# Patient Record
Sex: Female | Born: 1950
Health system: Southern US, Community
[De-identification: ages and names within clinical notes are randomized; demographics above are authoritative.]

## PROBLEM LIST (undated history)

## (undated) DIAGNOSIS — I519 Heart disease, unspecified: Secondary | ICD-10-CM

## (undated) DIAGNOSIS — R1030 Lower abdominal pain, unspecified: Secondary | ICD-10-CM

## (undated) DIAGNOSIS — M21611 Bunion of right foot: Secondary | ICD-10-CM

## (undated) DIAGNOSIS — J42 Unspecified chronic bronchitis: Secondary | ICD-10-CM

## (undated) DIAGNOSIS — R600 Localized edema: Secondary | ICD-10-CM

## (undated) DIAGNOSIS — M419 Scoliosis, unspecified: Secondary | ICD-10-CM

## (undated) DIAGNOSIS — I10 Essential (primary) hypertension: Secondary | ICD-10-CM

## (undated) DIAGNOSIS — E669 Obesity, unspecified: Secondary | ICD-10-CM

## (undated) DIAGNOSIS — M21612 Bunion of left foot: Secondary | ICD-10-CM

## (undated) DIAGNOSIS — R011 Cardiac murmur, unspecified: Secondary | ICD-10-CM

## (undated) DIAGNOSIS — I340 Nonrheumatic mitral (valve) insufficiency: Secondary | ICD-10-CM

## (undated) DIAGNOSIS — R002 Palpitations: Secondary | ICD-10-CM

## (undated) DIAGNOSIS — J45909 Unspecified asthma, uncomplicated: Secondary | ICD-10-CM

## (undated) DIAGNOSIS — I350 Nonrheumatic aortic (valve) stenosis: Secondary | ICD-10-CM

## (undated) DIAGNOSIS — E785 Hyperlipidemia, unspecified: Secondary | ICD-10-CM

## (undated) DIAGNOSIS — H269 Unspecified cataract: Secondary | ICD-10-CM

## (undated) DIAGNOSIS — M1990 Unspecified osteoarthritis, unspecified site: Secondary | ICD-10-CM

## (undated) HISTORY — PX: BUNIONECTOMY: SHX129

## (undated) HISTORY — DX: Scoliosis, unspecified: M41.9

## (undated) HISTORY — DX: Unspecified osteoarthritis, unspecified site: M19.90

## (undated) HISTORY — DX: Nonrheumatic mitral (valve) insufficiency: I34.0

## (undated) HISTORY — PX: TOTAL ABDOMINAL HYSTERECTOMY: SHX209

## (undated) HISTORY — DX: Bunion of right foot: M21.611

## (undated) HISTORY — PX: KNEE ARTHROSCOPY: SUR90

## (undated) HISTORY — PX: OTHER SURGICAL HISTORY: SHX169

## (undated) HISTORY — PX: ANKLE ARTHROSCOPY: SHX545

## (undated) HISTORY — DX: Unspecified asthma, uncomplicated: J45.909

## (undated) HISTORY — DX: Heart disease, unspecified: I51.9

## (undated) HISTORY — PX: ABDOMINAL HYSTERECTOMY: SHX81

## (undated) HISTORY — DX: Bunion of right foot: M21.612

## (undated) HISTORY — PX: EYE SURGERY: SHX253

## (undated) HISTORY — DX: Hyperlipidemia, unspecified: E78.5

## (undated) HISTORY — DX: Localized edema: R60.0

## (undated) HISTORY — DX: Palpitations: R00.2

## (undated) HISTORY — PX: TRANSTHORACIC ECHOCARDIOGRAM: SHX275

## (undated) HISTORY — DX: Unspecified chronic bronchitis: J42

## (undated) HISTORY — DX: Unspecified cataract: H26.9

## (undated) HISTORY — DX: Lower abdominal pain, unspecified: R10.30

## (undated) HISTORY — DX: Obesity, unspecified: E66.9

## (undated) HISTORY — DX: Nonrheumatic aortic (valve) stenosis: I35.0

## (undated) HISTORY — DX: Essential (primary) hypertension: I10

---
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
## 1999-01-07 ENCOUNTER — Other Ambulatory Visit: Admission: RE | Admit: 1999-01-07 | Discharge: 1999-01-07 | Payer: Self-pay | Admitting: *Deleted

## 1999-10-17 ENCOUNTER — Other Ambulatory Visit: Admission: RE | Admit: 1999-10-17 | Discharge: 1999-10-17 | Payer: Self-pay | Admitting: *Deleted

## 2000-11-12 ENCOUNTER — Other Ambulatory Visit: Admission: RE | Admit: 2000-11-12 | Discharge: 2000-11-12 | Payer: Self-pay | Admitting: *Deleted

## 2001-05-24 ENCOUNTER — Ambulatory Visit (HOSPITAL_COMMUNITY): Admission: RE | Admit: 2001-05-24 | Discharge: 2001-05-24 | Payer: Self-pay | Admitting: Gastroenterology

## 2001-05-24 ENCOUNTER — Encounter (INDEPENDENT_AMBULATORY_CARE_PROVIDER_SITE_OTHER): Payer: Self-pay | Admitting: Specialist

## 2001-06-15 ENCOUNTER — Encounter: Payer: Self-pay | Admitting: Gastroenterology

## 2001-06-15 ENCOUNTER — Encounter: Admission: RE | Admit: 2001-06-15 | Discharge: 2001-06-15 | Payer: Self-pay | Admitting: Gastroenterology

## 2001-08-19 ENCOUNTER — Encounter: Payer: Self-pay | Admitting: *Deleted

## 2001-08-22 ENCOUNTER — Encounter (INDEPENDENT_AMBULATORY_CARE_PROVIDER_SITE_OTHER): Payer: Self-pay | Admitting: Specialist

## 2001-08-23 ENCOUNTER — Inpatient Hospital Stay (HOSPITAL_COMMUNITY): Admission: RE | Admit: 2001-08-23 | Discharge: 2001-08-24 | Payer: Self-pay | Admitting: *Deleted

## 2002-11-22 ENCOUNTER — Other Ambulatory Visit: Admission: RE | Admit: 2002-11-22 | Discharge: 2002-11-22 | Payer: Self-pay | Admitting: *Deleted

## 2003-01-20 ENCOUNTER — Emergency Department (HOSPITAL_COMMUNITY): Admission: EM | Admit: 2003-01-20 | Discharge: 2003-01-20 | Payer: Self-pay | Admitting: Emergency Medicine

## 2003-07-22 ENCOUNTER — Emergency Department (HOSPITAL_COMMUNITY): Admission: EM | Admit: 2003-07-22 | Discharge: 2003-07-22 | Payer: Self-pay

## 2004-06-26 ENCOUNTER — Other Ambulatory Visit: Admission: RE | Admit: 2004-06-26 | Discharge: 2004-06-26 | Payer: Self-pay | Admitting: Obstetrics & Gynecology

## 2004-10-07 ENCOUNTER — Emergency Department (HOSPITAL_COMMUNITY): Admission: EM | Admit: 2004-10-07 | Discharge: 2004-10-08 | Payer: Self-pay | Admitting: Emergency Medicine

## 2004-10-11 ENCOUNTER — Emergency Department (HOSPITAL_COMMUNITY): Admission: EM | Admit: 2004-10-11 | Discharge: 2004-10-11 | Payer: Self-pay | Admitting: Family Medicine

## 2004-10-23 ENCOUNTER — Ambulatory Visit: Payer: Self-pay | Admitting: Internal Medicine

## 2004-11-03 ENCOUNTER — Emergency Department (HOSPITAL_COMMUNITY): Admission: EM | Admit: 2004-11-03 | Discharge: 2004-11-03 | Payer: Self-pay | Admitting: Emergency Medicine

## 2004-11-05 ENCOUNTER — Ambulatory Visit: Admission: RE | Admit: 2004-11-05 | Discharge: 2004-11-05 | Payer: Self-pay | Admitting: Internal Medicine

## 2004-11-21 ENCOUNTER — Ambulatory Visit: Payer: Self-pay | Admitting: Internal Medicine

## 2004-12-15 ENCOUNTER — Ambulatory Visit (HOSPITAL_BASED_OUTPATIENT_CLINIC_OR_DEPARTMENT_OTHER): Admission: RE | Admit: 2004-12-15 | Discharge: 2004-12-15 | Payer: Self-pay | Admitting: Orthopedic Surgery

## 2004-12-15 ENCOUNTER — Ambulatory Visit (HOSPITAL_COMMUNITY): Admission: RE | Admit: 2004-12-15 | Discharge: 2004-12-15 | Payer: Self-pay | Admitting: Orthopedic Surgery

## 2004-12-31 ENCOUNTER — Ambulatory Visit (HOSPITAL_COMMUNITY): Admission: RE | Admit: 2004-12-31 | Discharge: 2004-12-31 | Payer: Self-pay | Admitting: Orthopedic Surgery

## 2004-12-31 ENCOUNTER — Ambulatory Visit (HOSPITAL_BASED_OUTPATIENT_CLINIC_OR_DEPARTMENT_OTHER): Admission: RE | Admit: 2004-12-31 | Discharge: 2004-12-31 | Payer: Self-pay | Admitting: Orthopedic Surgery

## 2005-01-23 ENCOUNTER — Encounter: Admission: RE | Admit: 2005-01-23 | Discharge: 2005-01-23 | Payer: Self-pay | Admitting: Family Medicine

## 2005-01-30 ENCOUNTER — Encounter: Admission: RE | Admit: 2005-01-30 | Discharge: 2005-01-30 | Payer: Self-pay | Admitting: Internal Medicine

## 2005-08-11 ENCOUNTER — Other Ambulatory Visit: Admission: RE | Admit: 2005-08-11 | Discharge: 2005-08-11 | Payer: Self-pay | Admitting: Obstetrics & Gynecology

## 2007-11-17 ENCOUNTER — Encounter: Admission: RE | Admit: 2007-11-17 | Discharge: 2007-11-17 | Payer: Self-pay | Admitting: Family Medicine

## 2008-10-31 ENCOUNTER — Encounter: Payer: Self-pay | Admitting: Internal Medicine

## 2008-10-31 ENCOUNTER — Encounter: Payer: Self-pay | Admitting: Cardiology

## 2009-03-02 ENCOUNTER — Encounter: Payer: Self-pay | Admitting: Cardiology

## 2009-03-04 ENCOUNTER — Encounter: Payer: Self-pay | Admitting: Cardiology

## 2009-04-02 ENCOUNTER — Ambulatory Visit: Payer: Self-pay | Admitting: Internal Medicine

## 2009-04-02 DIAGNOSIS — R011 Cardiac murmur, unspecified: Secondary | ICD-10-CM | POA: Insufficient documentation

## 2009-04-02 DIAGNOSIS — J45901 Unspecified asthma with (acute) exacerbation: Secondary | ICD-10-CM | POA: Insufficient documentation

## 2009-04-03 ENCOUNTER — Encounter: Payer: Self-pay | Admitting: Internal Medicine

## 2009-04-03 ENCOUNTER — Ambulatory Visit: Payer: Self-pay | Admitting: Internal Medicine

## 2009-04-03 ENCOUNTER — Ambulatory Visit (HOSPITAL_COMMUNITY): Admission: RE | Admit: 2009-04-03 | Discharge: 2009-04-03 | Payer: Self-pay | Admitting: Internal Medicine

## 2009-04-03 ENCOUNTER — Ambulatory Visit: Payer: Self-pay

## 2009-05-02 ENCOUNTER — Ambulatory Visit: Payer: Self-pay | Admitting: Internal Medicine

## 2009-05-02 ENCOUNTER — Encounter: Payer: Self-pay | Admitting: Internal Medicine

## 2009-05-30 ENCOUNTER — Ambulatory Visit: Payer: Self-pay | Admitting: Cardiology

## 2009-05-30 DIAGNOSIS — I359 Nonrheumatic aortic valve disorder, unspecified: Secondary | ICD-10-CM | POA: Insufficient documentation

## 2009-05-30 DIAGNOSIS — I340 Nonrheumatic mitral (valve) insufficiency: Secondary | ICD-10-CM | POA: Insufficient documentation

## 2009-05-30 DIAGNOSIS — I351 Nonrheumatic aortic (valve) insufficiency: Secondary | ICD-10-CM | POA: Insufficient documentation

## 2009-05-30 DIAGNOSIS — E785 Hyperlipidemia, unspecified: Secondary | ICD-10-CM | POA: Insufficient documentation

## 2010-05-27 NOTE — Consult Note (Signed)
[facility]: [facility]   Imported By: Marylou Mccoy 06/05/2009 11:51:26  _____________________________________________________________________  External Attachment:    Type:   Image     Comment:   External Document

## 2010-05-27 NOTE — Assessment & Plan Note (Signed)
Summary: np6/ heart mumur on echo 12/8/ pt has uhc/ gd   Primary Provider:  [facility]  CC:  referal from Dr. Maple Hudson pt states she has a heart mumur.  History of Present Illness: 60 year old female for evaluation of murmur. No prior cardiac history. She has known about a murmur for approximately 4 years. She typically does not have dyspnea on exertion, orthopnea, PND, pedal edema, palpitations, syncope or chest pain. She has had bouts of asthma/bronchitis but does not have dyspnea at other times. Recent echocardiogram in December of 2010 revealed normal LV function, moderate left atrial enlargement, mild right atrial enlargement, mild to moderate mitral regurgitation and mild aortic insufficiency. There was mild aortic stenosis.  Pulmonary pressures were mildly elevated. Because of the above we are asked to further evaluate.  Current Medications (verified): 1)  Estrace 2 Mg Tabs (Estradiol) .... Take 2 By Mouth Once Daily 2)  Calcium-Vitamin D 600-200 Mg-Unit Tabs (Calcium-Vitamin D) .... Take 2 By Mouth Once Daily 3)  Aspir-Low 81 Mg Tbec (Aspirin) .... Take 1 By Mouth Once Daily 4)  Vitamin D3 1000 Unit Tabs (Cholecalciferol) .... Take 1 By Mouth Once Daily  Allergies: No Known Drug Allergies  Past History:  Past Medical History: ASTHMA UNSPECIFIED WITH EXACERBATION (ICD-493.92) RECURRENT BRONCHITIS. mild aortic stenosis, mild aortic insufficiency Mild to moderate mitral regurgitation hyperlipidemia  Past Surgical History: Reviewed history from 04/02/2009 and no changes required. Total Abdominal Hysterectomy Bunions Bilateral knee arthroscopy.  Family History: Reviewed history from 04/02/2009 and no changes required. Father- died GI Cancer Mother living No premature CAD  Social History: Reviewed history from 04/02/2009 and no changes required. Patient never smoked.  Married, Curator for [employer] Alcohol Use - no  Review of Systems   Recent bronchitis that has now resolved but no fevers or chills, productive cough, hemoptysis, dysphasia, odynophagia, melena, hematochezia, dysuria, hematuria, rash, seizure activity, orthopnea, PND, pedal edema, claudication. Remaining systems are negative.   Vital Signs:  Patient profile:   60 year old female Height:      66 inches Weight:      228 pounds BMI:     36.93 Pulse rate:   61 / minute Resp:     12 per minute BP sitting:   150 / 80  (left arm)  Vitals Entered By: Kem Parkinson (May 30, 2009 10:34 AM)  Physical Exam  General:  Well developed/obese in NAD Skin warm/dry Patient not depressed No peripheral clubbing Back-normal HEENT-normal/normal eyelids Neck supple/normal carotid upstroke bilaterally; no bruits; no JVD; no thyromegaly chest - CTA/ normal expansion CV - RRR/normal S1 and S2; 2/6 systolic murmur at left sternal border. S2 is not diminished. 2-3/6 systolic murmur at the apex. No S3 or S4. No rub. Abdomen -NT/ND, no HSM, no mass, + bowel sounds, no bruit 2+ femoral pulses, no bruits Ext-no edema, chords, 2+ DP Neuro-grossly nonfocal     EKG  Procedure date:  05/30/2009  Findings:      Sinus rhythm at a rate of 61. Axis normal. No ST changes.  Impression & Recommendations:  Problem # 1:  MITRAL VALVE DISORDERS (ICD-424.0) Patient has mild to moderate mitral regurgitation, mild aortic stenosis and mild aortic insufficiency. However she is not having symptoms from this. She will need a followup echocardiogram in one year. She may require valve replacement in the future if her valve problems progress. However she has not close to that at present.  Problem # 2:  AORTIC VALVE DISORDERS (  ICD-424.1) As per #1.  Problem # 3:  HYPERLIPIDEMIA (ICD-272.4) Management per primary care.  Problem # 4:  HEART MURMUR, SYSTOLIC (ICD-785.2) As per #1 and 2.  Problem # 5:  ASTHMA UNSPECIFIED WITH EXACERBATION (ICD-493.92) Management per  pulmonary.  Patient Instructions: 1)  Your physician recommends that you schedule a follow-up appointment in: one year 2)  Your physician has requested that you have an echocardiogram.  Echocardiography is a painless test that uses sound waves to create images of your heart. It provides your doctor with information about the size and shape of your heart and how well your heart's chambers and valves are working.  This procedure takes approximately one hour. There are no restrictions for this procedure.

## 2010-05-27 NOTE — Assessment & Plan Note (Signed)
Summary: bronchitis/apc   Primary Provider/Referring Provider:  [facility]  CC:  Pulmonary Consult-[facility].  History of Present Illness: 05/01/2009- 60 yoF referred courtesy of Dr Hyacinth Meeker at [facility] [address] because of bronchits and coughing. Originally from [place] 30 yrs ago, never smoked. She complains of a total of 5 episodes of bronchitis over the past 5 years, most recently in May and November 2010. One or two episodes of walking pnuemonia may be included in that count. She is well in between. This year she had noticed some wheeze. Asthma was dx'd 2 years ago, but she doesn't find inhalers helpful. Now she will notice some cough andwheeze especially at night, but she doesn't feel sick. Stress incontinence. coughs up very small bits of mucus. In retrospect, most of her episodes have probably begun as viral URIs, but she has had some colds that didn't progress, Denies dyspnea. no recognized environmental effect of place or temperature. No hx of allergic rhinitis or bothersome nose or sinus complaint.  CXR- Films reviewed. prominent interstitium vs obesity.   Preventive Screening-Counseling & Management  Alcohol-Tobacco     Smoking Status: never  Current Medications (verified): 1)  Estrace 2 Mg Tabs (Estradiol) .... Take 2 By Mouth Once Daily 2)  Calcium-Vitamin D 600-200 Mg-Unit Tabs (Calcium-Vitamin D) .... Take 2 By Mouth Once Daily 3)  Aspir-Low 81 Mg Tbec (Aspirin) .... Take 1 By Mouth Once Daily 4)  Vitamin D3 1000 Unit Tabs (Cholecalciferol) .... Take 1 By Mouth Once Daily  Allergies (verified): No Known Drug Allergies  Past History:  Family History: Last updated: May 01, 2009 Father- died GI Cancer Mother living  Social History: Last updated: 05-01-2009 Patient never smoked.  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  Past Surgical  History: Total Abdominal Hysterectomy Bunions Bilateral knee arthroscopy.  Family History: Father- died GI Cancer Mother living  Social History: Patient never smoked.  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  The patient denies shortness of breath with activity, shortness of breath at rest, non-productive cough, coughing up blood, chest pain, irregular heartbeats, acid heartburn, indigestion, loss of appetite, weight change, abdominal pain, difficulty swallowing, sore throat, tooth/dental problems, headaches, nasal congestion/difficulty breathing through nose, sneezing, itching, ear ache, anxiety, depression, joint stiffness or pain, rash, change in color of mucus, and fever.         dependent ankle edema  Vital Signs:  Patient profile:   60 year old female Height:      66 inches Weight:      229.38 pounds BMI:     37.16 O2 Sat:      99 % on Room air Pulse rate:   68 / minute BP sitting:   126 / 80  (left arm) Cuff size:   regular  Vitals Entered By: Reynaldo Minium CMA May 01, 2009 9:15 AM)  O2 Flow:  Room air  Physical Exam  Additional Exam:  General: A/Ox3; pleasant and cooperative, NAD, overweight SKIN: no rash, lesions NODES: no lymphadenopathy HEENT: [place]/AT, EOM- WNL, Conjuctivae- clear, PERRLA, TM-WNL, Nose- clear, Throat- mild redness, Mellampatti  II, no exudate NECK: Supple w/ fair ROM, JVD- none, normal carotid impulses w/o bruits Thyroid- normal to palpation CHEST: wheeze at left scapula, unlabored without cough. HEART: RRR, 2-3/6  SEM at LUSB ABDOMEN: Soft and nl; nml bowel sounds; no organomegaly or masses noted ZOX:WRUE, nl pulses, no edema, cyanosis or clubbing  NEURO: Grossly intact to observation      Impression & Recommendations:  Problem # 1:  ASTHMA UNSPECIFIED WITH EXACERBATION (ICD-493.92) Recurrent asthmatic  bronchits. Most episodes seem viral triggered. She is symptomatic today so I will give neb and depo. She hasn't found previous inhalers helpful so I will come back to that. The heart murmur is not obviously associated with pulmonary edema, but it will be useful to get an echo.She denies hx of RF. We discussed and will give pneumonia vax. Schedule PFT.  Medications Added to Medication List This Visit: 1)  Estrace 2 Mg Tabs (Estradiol) .... Take 2 by mouth once daily 2)  Calcium-vitamin D 600-200 Mg-unit Tabs (Calcium-vitamin d) .... Take 2 by mouth once daily 3)  Aspir-low 81 Mg Tbec (Aspirin) .... Take 1 by mouth once daily 4)  Vitamin D3 1000 Unit Tabs (Cholecalciferol) .... Take 1 by mouth once daily  Other Orders: Consultation Level IV (45409) Echo Referral (Echo) Admin of Therapeutic Inj  intramuscular or subcutaneous (81191) Depo- Medrol 80mg  (J1040) Nebulizer Tx (47829) Pneumococcal Vaccine (56213) Admin 1st Vaccine (08657)  Patient Instructions: 1)  Please schedule a follow-up appointment in 1 month. 2)  Schedule PFT 3)  See PCC to schedule echocardiogram 4)  Pneumonia vax 5)  neb xop 1.25 6)  depo 80   Immunizations Administered:  Pneumonia Vaccine:    Vaccine Type: Pneumovax    Site: left deltoid    Mfr: Merck    Dose: 0.5 ml    Route: IM    Given by: Reynaldo Minium CMA    Exp. Date: 04/12/2010    Lot #: [id number]    VIS given: 11/23/95 version given April 02, 2009.    Medication Administration  Injection # 1:    Medication: Depo- Medrol 80mg     Diagnosis: ASTHMA UNSPECIFIED WITH EXACERBATION (ICD-493.92)    Route: SQ    Site: LUOQ gluteus    Exp Date: 01/2010    Lot #: [id number]    Mfr: Teva    Patient tolerated injection without complications    Given by: Reynaldo Minium CMA (April 02, 2009 10:04 AM)  Medication # 1:    Medication: Xopenex 1.25mg     Diagnosis: ASTHMA UNSPECIFIED WITH EXACERBATION (KGM-010.27)    Dose: 1 vial    Route: inhaled    Exp  Date: 09/2009    Lot #: [id number]    Mfr: Sepracor    Patient tolerated medication without complications    Given by: Reynaldo Minium CMA (April 02, 2009 10:05 AM)  Orders Added: 1)  Consultation Level IV [03474] 2)  Echo Referral [Echo] 3)  Admin of Therapeutic Inj  intramuscular or subcutaneous [96372] 4)  Depo- Medrol 80mg  [J1040] 5)  Nebulizer Tx [25956] 6)  Pneumococcal Vaccine [90732] 7)  Admin 1st Vaccine [38756]

## 2010-05-27 NOTE — Letter (Signed)
Summary: [facility] - Phone Note  [facility] - [facility]   Imported By: Marylou Mccoy 06/05/2009 11:50:08  _____________________________________________________________________  External Attachment:    Type:   Image     Comment:   External Document

## 2010-05-27 NOTE — Assessment & Plan Note (Signed)
Summary: rov/apc   Primary Provider/Referring Provider:  [facility]  CC:  Pt here for follow up with PFT.  History of Present Illness: History of Present Illness: 04/18/2009- 60 yoF referred courtesy of Dr Hyacinth Meeker at [facility] [address] because of bronchits and coughing. Originally from [place] 30 yrs ago, never smoked. She complains of a total of 5 episodes of bronchitis over the past 5 years, most recently in May and November 2010. One or two episodes of walking pnuemonia may be included in that count. She is well in between. This year she had noticed some wheeze. Asthma was dx'd 2 years ago, but she doesn't find inhalers helpful. Now she will notice some cough andwheeze especially at night, but she doesn't feel sick. Stress incontinence. coughs up very small bits of mucus. In retrospect, most of her episodes have probably begun as viral URIs, but she has had some colds that didn't progress, Denies dyspnea. no recognized environmental effect of place or temperature. No hx of allergic rhinitis or bothersome nose or sinus complaint.  CXR- Films reviewed. prominent interstitium vs obesity.  May 02, 2009- Cough Cough less, but still stress incontinence. Less wheeze. Minor sore throat 2 days ago. Some "film" in throat, but she denies postnasal drip or reflux, and no longer brings up phlegm. ECHO- mild AR. mild to mod MR, mild PHTN 39S, EF 60%. Feet do swell when she travels usually, but not with latest trip to [facility]. PFT- mild reduction of Diffusion, possibly cardiogenic. Normal flows and volumes.   Current Medications (verified): 1)  Estrace 2 Mg Tabs (Estradiol) .... Take 2 By Mouth Once Daily 2)  Calcium-Vitamin D 600-200 Mg-Unit Tabs (Calcium-Vitamin D) .... Take 2 By Mouth Once Daily 3)  Aspir-Low 81 Mg Tbec (Aspirin) .... Take 1 By Mouth Once Daily 4)  Vitamin D3 1000 Unit Tabs (Cholecalciferol) .... Take 1 By Mouth Once Daily  Allergies (verified): No Known Drug  Allergies  Past History:  Past Medical History: Last updated: 18-Apr-2009 RECURRENT BRONCHITIS.  Past Surgical History: Last updated: 04-18-09 Total Abdominal Hysterectomy Bunions Bilateral knee arthroscopy.  Family History: Last updated: 2009/04/18 Father- died GI Cancer Mother living  Social History: Last updated: 2009-04-18 Patient never smoked.  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  The patient denies anorexia, fever, weight loss, weight gain, vision loss, decreased hearing, hoarseness, chest pain, syncope, peripheral edema, prolonged cough, headaches, hemoptysis, and severe indigestion/heartburn.    Vital Signs:  Patient profile:   60 year old female Height:      66 inches Weight:      227 pounds O2 Sat:      100 % on Room air Pulse rate:   67 / minute BP sitting:   126 / 80  (left arm) Cuff size:   regular  Vitals Entered By: Zackery Barefoot CMA (May 02, 2009 10:01 AM)  O2 Flow:  Room air CC: Pt here for follow up with PFT Comments Medications reviewed with patient Zackery Barefoot CMA  May 02, 2009 10:01 AM    Physical Exam  Additional Exam:  General: A/Ox3; pleasant and cooperative, NAD, overweight SKIN: no rash, lesions NODES: no lymphadenopathy HEENT: [place]/AT, EOM- WNL, Conjuctivae- clear, PERRLA, TM-WNL, Nose- clear, Throat- clear, Mellampatti  II, no exudate NECK:  Supple w/ fair ROM, JVD- none, normal carotid impulses w/o bruits Thyroid- CHEST: grunting a little and throat clearing HEART: RRR, 2-3/6 SEM at LUSB ABDOMEN: Soft and nl;  ZOX:WRUE, nl pulses, no edema, cyanosis or clubbing  NEURO: Grossly intact to observation      Impression & Recommendations:  Problem # 1:  ASTHMA UNSPECIFIED WITH EXACERBATION (ICD-493.92) Mild chronic bronchitis. We will see if there is any benefit  from trial of Spiriva. I doubt she is getting enough cardiogenic back-pressure to affect her lungs symptomatically.  Problem # 2:  HEART MURMUR, SYSTOLIC (ICD-785.2)  Mitral regurg. We will ask cardiology to see her for first contact.   Other Orders: Est. Patient Level III (45409) Cardiology Referral (Cardiology)  Patient Instructions: 1)  Please schedule a follow-up appointment in 3 months. 2)  Sample Spiriva, 1 daily. If helpful then call for script 3)  We are scheduling Cardiology consultaion as discussed.   Immunization History:  Influenza Immunization History:    Influenza:  historical (03/27/2009)

## 2010-05-27 NOTE — Miscellaneous (Signed)
Summary: Orders Update-PFT CHARGES   Clinical Lists Changes  Orders: Added new Service order of Spirometry (Pre & Post) (94060) - Signed Added new Service order of Lung Volumes (94240) - Signed Added new Service order of Carbon Monoxide diffusing w/capacity (94720) - Signed 

## 2010-09-12 NOTE — Discharge Summary (Signed)
[facility]  Patient:    Lisa Miles, Lisa Miles Visit Number: [id number] MRN: [id number]          Service Type: GYN Location: 9300 [id number] Attending Physician:  Donne Hazel Dictated by:   Willey Blade, M.D. Admit Date:  08/22/2001 Discharge Date: 08/24/2001                             Discharge Summary  HISTORY OF PRESENT ILLNESS:   The patient is a 60 year old female, gravida 2, para 2, admitted for vaginal hysterectomy for symptomatic uterine fibroids. The patient has been followed conservatively with her uterine fibroids and abnormal uterine bleeding and now requests surgical intervention.  She declined strongly oophorectomy.  She is otherwise very healthy.  PAST MEDICAL HISTORY:         None.  PAST SURGICAL HISTORY:        None.  OBSTETRICAL HISTORY:          Normal spontaneous vaginal delivery x 2 at term.  CURRENT MEDICATIONS:          Claritin-D.  ALLERGIES:                    None known.  PHYSICAL EXAMINATION:         Please see clinic admission history and physical.  ADMISSION DIAGNOSES:          1. Symptomatic uterine fibroids.                               2. Patient declined oophorectomy.  HOSPITAL COURSE:              The patient was admitted on same day of surgery, August 22, 2001, where she underwent a total vaginal hysterectomy which went uneventfully.  The uterus was a good bit larger than I had anticipated preoperatively.  However, with care the surgery went well and uneventfully.  A single clear ovarian cyst was noted on the right ovary, and this was drained with a Bovie cautery.  Pathology showed a benign fibroid uterus weighing 702 grams.  The patients postoperative course was uneventful.  She was slowly advanced to a regular diet which she tolerated well prior to discharge.  Her hemoglobin stabilized at 8.3, and she was asymptomatic with this degree of anemia.  Her admission hemoglobin was 8.5.  She did well with  normal bowel and bladder function, was ambulating without difficulty, and requested discharge on postoperative day #2.  She had no postoperative complications.  As mentioned, the patient did not want her ovaries removed, and I discussed the risks and benefits of this with her.  DISCHARGE DIAGNOSES:          1. Status post total vaginal hysterectomy for                                  uterine fibroids.                               2. Anemia, stable and asymptomatic.  PLAN:                         1. Home.  2. Percocet #40 for discomfort.                               3. Iron 1 p.o. b.i.d.                               4. Routine postoperative instructions.  FOLLOW-UP:                    In the office in one week for a routine postoperative check. Dictated by:   Willey Blade, M.D. Attending Physician:  Donne Hazel DD:  09/30/01 TD:  10/03/01 Job: [id number]

## 2010-09-12 NOTE — Op Note (Signed)
[facility]  Patient:    Lisa Miles, Lisa Miles Visit Number: [id number] MRN: [id number]          Service Type: OBV Location: 9300 [id number] Attending Physician:  Donne Hazel Dictated by:   Willey Blade, M.D. Proc. Date: 08/22/01 Admit Date:  08/22/2001                             Operative Report  PREOPERATIVE DIAGNOSIS:       Symptomatic uterine fibroids.  POSTOPERATIVE DIAGNOSIS:      Symptomatic uterine fibroids.  OPERATION:                    Total vaginal hysterectomy.  SURGEON:                      Willey Blade, M.D.  ASSISTANT:                    Marcelle Overlie, M.D.  ANESTHESIA:                   General endotracheal anesthesia.  ESTIMATED BLOOD LOSS:         400 cc.  COMPLICATIONS:                None.  FINDINGS:                     At time of surgery, an enlarged uterus consistent with uterine fibroids was encountered.  The ovaries were visualized and noted to be normal.  There was a small simple cyst on the right ovary which was cauterized and removed.  DISCUSSION:                   The patient and I discussed the risks and benefits as well as alternative treatments for this surgery.  The patient strongly requested _____ preservation and declined oophorectomy which I respected during this procedure.  DESCRIPTION OF PROCEDURE:     The patient was taken to the operating room where a general endotracheal anesthesia was administered.  The patient was then placed on the operating table in the dorsal lithotomy position.  The perineum and vagina were prepped and draped in the usual sterile fashion with Betadine and sterile drapes.  A weighted speculum was placed in the posterior fornix of the vagina and the cervix was grasped with a Jacobs tenaculum.  The posterior cul-de-sac was then sharply and atraumatically entered.  The uterosacral ligaments were clamped bilaterally with a gyrus cautery device and cauterized thoroughly and  divided sharply.  An anterior and circumferential incision was then completed through the anterior portion of the cervix sharply.  The bladder flap was dissected anteriorly and a bladder blade was placed behind the bladder to insure protection during dissection.  Successful biopsy was then carried up the uterus.  All vascular pedicles were cauterized with the gyrus cautery device.  The vascular pedicles were cauterized and divided sharply.  Successive bites were carried up the body of the uterus pat the uterine arteries bilaterally.  The uterus was then morcellated to remove it more easily.  This was done with the knife and done carefully and systematically until the uterus body was small enough to fit through the vagina.  The uterus was then delivered with traction and the utero-ovarian ligaments were clamped bilaterally with the gyrus cautery device.  The utero-ovarian  ligaments were cauterized thoroughly and the uterus dissected free.  All the operative areas were visualized with good hemostasis noted.  Th anterior cul-de-sac was eventually entered and atraumatically entered and a Deaver was placed behind the bladder to insure its protection during dissection.  Good hemostasis was noted in the operative areas.  Attention was then turned to closure.  The posterior cul-de-sac was obliterated by ligating the uterosacral ligaments together in the midline with 0 Vicryl suture.  The vaginal cuff was attached to the uterosacral ligaments with a transfixing suture of 0 Vicryl.  After attachment of the vagina to the uterosacral ligaments and obliteration of the cul-de-sac by plicating uterosacral ligaments in the midline, the vaginal cuff was closed in an anterior to posterior fashion with multiple interrupted sutures of 0 Monocryl.  This was closed completely.  Good hemostasis was noted.  A Foley catheter was inserted with clear copious urine obtained on return.  There were no perioperative  complications.  Sponge, needle and instrument counts were correct x 3.  Blood loss was 400 cc and this was mostly due to morcellation of the uterus.  There were no perioperative complications.  Again the patient did decline oophorectomy but the ovaries appeared to be normal.   Dictated by:   Willey Blade, M.D.  Attending Physician:  Donne Hazel DD:  08/22/01 TD:  08/22/01 Job: [id number]

## 2010-09-12 NOTE — Procedures (Signed)
. [facility]  Patient:    Lisa Miles, Lisa Miles Visit Number: [id number] MRN: [id number]          Service Type: END Location: ENDO Attending Physician:  Charna Elizabeth Dictated by:   Anselmo Rod, M.D. Proc. Date: 05/24/01 Admit Date:  05/24/2001   CC:         Earlene Plater L. Cloward, M.D., [facility], [facility]   Procedure Report  DATE OF BIRTH:  [birth year].  PROCEDURE:  Esophagogastroduodenoscopy with biopsies.  ENDOSCOPIST:  Anselmo Rod, M.D.  INSTRUMENT USED:  Olympus video panendoscope.  INDICATION FOR PROCEDURE:  A 60 year old African-American female with a history of rectal bleeding and iron deficiency anemia.  Rule out peptic ulcer disease, esophagitis, gastritis, etc.  PREPROCEDURE PREPARATION:  Informed consent was procured from the patient. The patient was fasted for eight hours prior to the procedure.  PREPROCEDURE PHYSICAL:  VITAL SIGNS:  The patient had stable vital signs.  NECK:  Supple.  CHEST:  Clear to auscultation.  S1, S2 regular.  ABDOMEN:  Soft with normal bowel sounds.  DESCRIPTION OF PROCEDURE:  The patient was placed in the left lateral decubitus position and sedated with 50 mg of Demerol and 5 mg of Versed intravenously.  Once the patient was adequately sedate and maintained on low-flow oxygen and continuous cardiac monitoring, the Olympus video panendoscope was advanced through the mouthpiece, over the tongue, into the esophagus under direct vision.  The entire esophagus appeared normal and without lesions.  On further advancing the scope into the stomach, there were a few small sessile polyps seen in the proximal half of the stomach that were biopsied for pathology.  The rest of the gastric mucosa appeared healthy and without lesions.  The proximal small bowel appeared normal as well.  There was no outlet obstruction.  The patient tolerated the procedure well  without complications.  IMPRESSION: 1. Normal-appearing esophagus and proximal small bowel. 2. Few sessile polyps in the proximal half of the stomach, biopsied for    pathology.  RECOMMENDATIONS: 1. Await pathology results. 2. Proceed with colonoscopy at this time. 3. Avoid all nonsteroidals for the next three to four weeks. Dictated by:   Anselmo Rod, M.D. Attending Physician:  Charna Elizabeth DD:  05/24/01 TD:  05/24/01 Job: [id number]

## 2010-09-12 NOTE — Op Note (Signed)
[name], [name]               ACCOUNT NO.:  [id number]   MEDICAL RECORD NO.:  [id number]          PATIENT TYPE:  AMB   LOCATION:  DSC                          FACILITY:  [facility]   PHYSICIAN:  Feliberto Gottron. Turner Daniels, M.D.   DATE OF BIRTH:  12/15/1950   DATE OF PROCEDURE:  12/15/2004  DATE OF DISCHARGE:                                 OPERATIVE REPORT   PREOPERATIVE DIAGNOSIS:  Left ankle possible loose body, left foot plantar  fasciitis and left foot bunion deformity.   POSTOPERATIVE DIAGNOSIS:  Same.   PROCEDURE:  Arthroscopic synovectomy of left ankle, left endoscopic plantar  fascia release, and left bunion correction simple using a silver exostectomy  and medial reefing.   SURGEON:  Feliberto Gottron. Turner Daniels, M.D.   ASSISTANT:  Skip Mayer, PA-C.   ANESTHETIC:  General LMA.   ESTIMATED BLOOD LOSS:  Minimal.   FLUID REPLACEMENT:  800 mL crystalloid.   TOURNIQUET TIME:  40 minutes.   INDICATIONS FOR PROCEDURE:  60 year old woman with left foot pain of  longstanding.  She has an MRI proven plantar fasciitis. This failed  conservative treatment with stretching and cortisone injections. She has a  possible loose body in the left ankle was some catching and pain that has  responded temporarily to a cortisone injection and she has a left foot  bunion deformity that is primarily and exostosis with a first, second  metatarsal angle of less than 9 degrees and a prominent medial bunion and as  far as the MTP angle was less then 40 degrees. All these problems have  bothered her a great deal.  She has failed conservative treatment and she  desires elective endoscopic plantar fascia release, ankle arthroscopy and  simple bunion correction. The risks and benefits of surgery discussed at  length with the patient prior to undergoing intervention. All questions were  answered. She is taken for the above procedures.   DESCRIPTION OF PROCEDURE:  The patient was identified by armband and taken  to the  operating room at [facility] day surgery center where the appropriate  anesthetic monitors were attached and general LMA anesthesia induced with  the patient in supine position. A tourniquet was applied high to the left  calf and the left lower extremity prepped and draped in usual sterile  fashion from the toes to the tourniquet. The ankle distractor was then  clamped on the table and the ankle was placed in standard distraction and  the joint fill with normal saline solution from an anterolateral approach  with an 18 gauge needle. Using a #11 blade we then made a standard  anterolateral portal, allowing introduction of the ankle arthroscope and  diagnostic arthroscopy revealed some inflamed synovium and the anteromedial,  anterolateral gutters, good condition of the articular cartilage and the  ATFL swapping scopes.  A little bit of fraying in the anterior leading edge  of the ATFL was noted and this was debrided.  There was no significant  spurring of any of bone.  The gutters were cleared medially and laterally  and the articular cartilage was probed and there were  no loose bodies  encountered. At this point the ankle was irrigated out normal saline  solution and the arthroscopic consents removed.  The limb was then wrapped  with an Esmarch bandage.  Tourniquet inflated to 300 mmHg and we began the  plantar fascial procedure by using a #11 blade to make a small stab wound 1  cm anterior and plantar to the calcaneal tubercle medially and allowing  passage of the endoscopic plantar fascial cannula across the plantar fascia  and exiting laterally through a second stab wound.  The cannula was rotated  with the slot dorsal clearly allowing [place] to visualize the plantar fascia  with the scope no water was used. Under arthroscopic visualization, using  the triangular blade, we then released the plantar fascia from medial to  lateral, performed a windlass maneuver with the toes to confirm that the   plantar fascia been released and probed make sure no remaining fibers were  noted. The wound was then irrigated out with arthroscopic fluid solution and  the cannula removed. We then directed our attention to the bunion deformity  and made a standard dorsomedial approach from centimeter distal to the MTP  joint to about 3 cm proximal to the MTP joint.  We carefully retracted small  branches of the sural nerve dorsally and then entered the joint from distal  to proximal dorsal medially with a longitudinal incision down to the bone  and hockey-sticked this incision distally for about 8 mm allowing [place] to  examine the joint which was in relatively good condition. The exostosis was  large the 15 blade was used to lift the medial ligaments off of the  exostosis exposing it.  Staying to the medial side of the crista of the  metatarsal, we then performed an exostectomy using the ACL saw and smoothed  off the edges. The hockey-stick incision was then trimmed to straighten out  the MTP joint which was quite flexible with closure and the wound irrigated  out with normal saline solution.  3-0 Vicryl was then used to close the  hockey-stick incision straightening out the toe and performing the medial  reefing. Once again the wound was irrigated out. The skin was then closed  with running interlocking 3-0 nylon suture and a dressing of Xeroform, 4x4,  dressing sponges between the toes and a toe spica dressing was applied  followed by an Ace wrap and a postoperative shoe. The patient was then  awakened and taken to the recovery room without difficulty. Dressings were  also applied to the arthroscopic portals with 4x4s and the same Webril that  was used on the rest of the foot.      Feliberto Gottron. Turner Daniels, M.D.  Electronically Signed     FJR/MEDQ  D:  12/15/2004  T:  12/15/2004  Job:  [id number]

## 2010-09-12 NOTE — Procedures (Signed)
[place]. [facility]  Patient:    Lisa Miles, Lisa Miles Visit Number: [id number] MRN: [id number]          Service Type: END Location: ENDO Attending Physician:  Charna Elizabeth Dictated by:   Anselmo Rod, M.D. Proc. Date: 05/24/01 Admit Date:  05/24/2001   CC:         Earlene Plater L. Cloward, M.D., [facility], [address]   Procedure Report  DATE OF BIRTH:  05-14-1950.  PROCEDURE:  Colonoscopy.  ENDOSCOPIST:  Anselmo Rod, M.D.  INSTRUMENT USED:  Olympus video colonoscope.  INDICATION FOR PROCEDURE:  A 60 year old African-American female undergoing screening colonoscopy because of rectal bleeding, a history of iron deficiency anemia.  PREPROCEDURE PREPARATION:  Informed consent was procured from the patient. The patient was fasted for eight hours prior to the procedure and prepped with a bottle of magnesium citrate and a gallon of NuLytely the night prior to the procedure.  PREPROCEDURE PHYSICAL:  VITAL SIGNS:  The patient had stable vital signs.  NECK:  Supple.  CHEST:  Clear to auscultation.  S1, S2 regular.  ABDOMEN:  Soft with normal bowel sounds.  DESCRIPTION OF PROCEDURE:  The patient was placed in the left lateral decubitus position and sedated with an additional 25 mg of Demerol and 2.5 mg of Versed intravenously.  Once the patient was adequately sedate and maintained on low-flow oxygen and continuous cardiac monitoring, the Olympus video colonoscope was advanced from the rectum to the cecum without difficulty.  Except for small, nonbleeding internal and external hemorrhoids, no other abnormalities were seen.  The patient was on her menstrual cycle during the procedure and had significant vaginal bleeding.  No masses, polyps, erosions, or ulcerations were noted.  There was no evidence of diverticular disease.  The procedure was complete up to the cecum.  The ileocecal valve and the appendiceal orifice were clearly visualized and  photographed.  IMPRESSION:  A healthy-appearing colon except for small, nonbleeding internal and external hemorrhoids.  RECOMMENDATIONS: 1. A small bowel follow-through will be planned for the patient to complete    her GI evaluation. 2. CBC will be checked. 3. Outpatient follow-up in the next four weeks. 4. Avoid all nonsteroidals for the next three to four weeks, as mentioned in    the EGD report. 5. I suspect the patients anemia may be secondary to heavy menstrual cycles.    A gynecological evaluation may be helpful in her case. Dictated by:   Anselmo Rod, M.D. Attending Physician:  Charna Elizabeth DD:  05/24/01 TD:  05/24/01 Job: [id number]

## 2010-09-12 NOTE — Op Note (Signed)
NAMEAMORY, ZBIKOWSKI               ACCOUNT NO.:  [id number]   MEDICAL RECORD NO.:  [id number]          PATIENT TYPE:  AMB   LOCATION:  DSC                          FACILITY:  [facility]   PHYSICIAN:  Feliberto Gottron. Turner Daniels, M.D.   DATE OF BIRTH:  [birth year]   DATE OF PROCEDURE:  12/31/2004  DATE OF DISCHARGE:                                 OPERATIVE REPORT   PREOPERATIVE DIAGNOSIS:  Right bunion deformity.   POSTOPERATIVE DIAGNOSIS:  Right bunion deformity.   PROCEDURE:  Right modified McBride bunionectomy.   SURGEON:  Feliberto Gottron. Turner Daniels, M.D.   FIRST ASSISTANT:  Erskine Squibb B. Su Hilt, P.A.-c.   ANESTHETIC:  General LMA.   ESTIMATED BLOOD LOSS:  Minimal.   FLUID REPLACEMENT:  500 mL  crystalloid.   TOURNIQUET TIME:  25 minutes.   INDICATIONS FOR PROCEDURE:  A 60 year old woman who underwent a left  modified McBride bunionectomy two weeks ago, has done very well and now  desires the same for a fairly impressive exostosis on the right side with an  MTP angle of only about 25 degrees and for second intermetatarsal angle of  only about 9 degrees. She is a good candidate for a simple exostectomy,  lateral release and medial reefing. Risks and benefits of surgery discussed  preoperatively and all questions answered.   DESCRIPTION OF PROCEDURE:  The patient identified by armband, taken the  operating room at [facility]. Appropriate anesthetic  monitors were attached and general LMA anesthesia induced with the patient  supine position.  Right ankle tourniquet applied and the right foot prepped  and draped in the usual sterile fashion from the toes to the tourniquet.  Prior to this, we did take the sutures out of the left bunionectomy per the  patient's request.  After the sterile prep and drape, the foot was wrapped  with an Esmarch bandage, tourniquet inflated 300 mmHg and a dorsomedial  incision was made centered over the exostosis 4 to 5 cm in length just  through the skin into  the subcutaneous tissue. Small branches as seral nerve  were identified and preserved.  A hockey-stick incision was made in the MTP  joint capsule starting longitudinally, dorsomedially and at the joint line  going plantarly for about a centimeter.  This allowed [place] to peel the soft  tissue off of the exostosis, developing a volar based flap of periosteum and  joint capsule. Once this had been accomplished, the power oscillating saw  from the ACL set was used to remove the exostosis and the edges were  smoothed at the level of the medial crista. We then resected a wedge of  tissue from the angle of the hockey-stick incision allowing [place] to reef  medially down to put the toe in a neutral position. The lateral capsule was  fenestrated at this time with a #11 blade going through the joint in a  couple of places, but overall she had good ligamentous laxity. After the  wedge of tissue had been resected we then repaired the capsule with 4-0  Vicryl suture and basically eliminated the MTP  deformity as the sutures were  tightened. The wound was irrigated out with normal saline solution. The skin  was closed with 4-0 running nylon suture, a dressing of Xeroform 4x4  dressing sponges, Webril and Ace wrap applied. The tourniquet was let down.  The patient was awakened and taken to the recovery room without difficulty.      Feliberto Gottron. Turner Daniels, M.D.  Electronically Signed     FJR/MEDQ  D:  12/31/2004  T:  12/31/2004  Job:  [id number]

## 2011-03-29 LAB — HM COLONOSCOPY: HM Colonoscopy: NORMAL

## 2011-07-30 ENCOUNTER — Ambulatory Visit (INDEPENDENT_AMBULATORY_CARE_PROVIDER_SITE_OTHER): Payer: BC Managed Care – PPO | Admitting: Emergency Medicine

## 2011-07-30 ENCOUNTER — Ambulatory Visit: Payer: BC Managed Care – PPO

## 2011-07-30 VITALS — BP 135/80 | HR 83 | Temp 98.0°F | Resp 16 | Ht 65.0 in | Wt 223.2 lb

## 2011-07-30 DIAGNOSIS — R011 Cardiac murmur, unspecified: Secondary | ICD-10-CM

## 2011-07-30 DIAGNOSIS — M549 Dorsalgia, unspecified: Secondary | ICD-10-CM

## 2011-07-30 DIAGNOSIS — M546 Pain in thoracic spine: Secondary | ICD-10-CM

## 2011-07-30 MED ORDER — MELOXICAM 7.5 MG PO TABS: ORAL_TABLET | ORAL | Status: DC

## 2011-07-30 MED ORDER — CYCLOBENZAPRINE HCL 10 MG PO TABS: ORAL_TABLET | ORAL | Status: DC

## 2011-07-30 NOTE — Progress Notes (Signed)
  Subjective:    Patient ID: Lisa Miles, female    DOB: [birth year], 61 y.o.   MRN: [id number]  HPI patient enters with a two-week history of pain in her lower back. She has also had pain around her left scapula. She does seem to hurt worse when she takes a deep breath or turns or twists.    Review of Systems patient is under the care of Dr. Herbie Baltimore for a heart murmur and hypertension.     Objective:   Physical Exam  Constitutional: She appears well-developed and well-nourished.  HENT:  Head: Normocephalic.  Eyes: Pupils are equal, round, and reactive to light.  Neck: No tracheal deviation present. No thyromegaly present.  Cardiovascular: Normal rate.  Exam reveals no gallop and no friction rub.   Murmur heard.      Murmurs a 3/6 systolic murmur at the base of the heart and left sternal border.  Pulmonary/Chest: No respiratory distress. She has no wheezes. She has no rales. She exhibits no tenderness.  Abdominal: There is no tenderness. There is no rebound.   [facility] reading (PRIMARY) by  Dr.Bertran Zeimet chest x-ray shows a scoliosis. Heart size is normal lung fields are clear . [name] Kitchen        Assessment & Plan:   Pain seems to be musculoskeletal. She does hurt when she lifts her arms and goes across her torso. She also hurts to take a full breath.

## 2012-03-28 LAB — HM DEXA SCAN

## 2012-03-28 LAB — HM MAMMOGRAPHY: HM Mammogram: NORMAL

## 2012-07-14 ENCOUNTER — Ambulatory Visit (INDEPENDENT_AMBULATORY_CARE_PROVIDER_SITE_OTHER): Payer: BC Managed Care – PPO | Admitting: Family Medicine

## 2012-07-14 ENCOUNTER — Encounter: Payer: Self-pay | Admitting: Family Medicine

## 2012-07-14 VITALS — BP 116/64 | HR 68 | Temp 98.6°F | Ht 66.0 in | Wt 228.0 lb

## 2012-07-14 DIAGNOSIS — I10 Essential (primary) hypertension: Secondary | ICD-10-CM | POA: Insufficient documentation

## 2012-07-14 DIAGNOSIS — R1031 Right lower quadrant pain: Secondary | ICD-10-CM | POA: Insufficient documentation

## 2012-07-14 DIAGNOSIS — R109 Unspecified abdominal pain: Secondary | ICD-10-CM

## 2012-07-14 LAB — HEPATIC FUNCTION PANEL
ALT: 17 U/L (ref 0–35)
AST: 18 U/L (ref 0–37)
Albumin: 3.3 g/dL — ABNORMAL LOW (ref 3.5–5.2)
Alkaline Phosphatase: 37 U/L — ABNORMAL LOW (ref 39–117)
Bilirubin, Direct: 0 mg/dL (ref 0.0–0.3)
Total Bilirubin: 0.5 mg/dL (ref 0.3–1.2)
Total Protein: 6.7 g/dL (ref 6.0–8.3)

## 2012-07-14 LAB — CBC WITH DIFFERENTIAL/PLATELET
Basophils Absolute: 0 10*3/uL (ref 0.0–0.1)
Basophils Relative: 0.6 % (ref 0.0–3.0)
Eosinophils Absolute: 0.1 10*3/uL (ref 0.0–0.7)
Eosinophils Relative: 2.4 % (ref 0.0–5.0)
HCT: 37.6 % (ref 36.0–46.0)
Hemoglobin: 12.8 g/dL (ref 12.0–15.0)
Lymphocytes Relative: 30.6 % (ref 12.0–46.0)
Lymphs Abs: 1.1 10*3/uL (ref 0.7–4.0)
MCHC: 33.9 g/dL (ref 30.0–36.0)
MCV: 88.8 fl (ref 78.0–100.0)
Monocytes Absolute: 0.3 10*3/uL (ref 0.1–1.0)
Monocytes Relative: 8.1 % (ref 3.0–12.0)
Neutro Abs: 2 10*3/uL (ref 1.4–7.7)
Neutrophils Relative %: 58.3 % (ref 43.0–77.0)
Platelets: 256 10*3/uL (ref 150.0–400.0)
RBC: 4.23 Mil/uL (ref 3.87–5.11)
RDW: 13.4 % (ref 11.5–14.6)
WBC: 3.5 10*3/uL — ABNORMAL LOW (ref 4.5–10.5)

## 2012-07-14 LAB — LIPID PANEL
Cholesterol: 176 mg/dL (ref 0–200)
HDL: 52.7 mg/dL (ref >39.00)
LDL Cholesterol: 99 mg/dL (ref 0–99)
Total CHOL/HDL Ratio: 3
Triglycerides: 122 mg/dL (ref 0.0–149.0)
VLDL: 24.4 mg/dL (ref 0.0–40.0)

## 2012-07-14 LAB — BASIC METABOLIC PANEL
BUN: 17 mg/dL (ref 6–23)
CO2: 29 mEq/L (ref 19–32)
Calcium: 9.1 mg/dL (ref 8.4–10.5)
Chloride: 101 mEq/L (ref 96–112)
Creatinine, Ser: 0.8 mg/dL (ref 0.4–1.2)
GFR: 96.41 mL/min (ref >60.00)
Glucose, Bld: 133 mg/dL — ABNORMAL HIGH (ref 70–99)
Potassium: 3.3 mEq/L — ABNORMAL LOW (ref 3.5–5.1)
Sodium: 137 mEq/L (ref 135–145)

## 2012-07-14 LAB — TSH: TSH: 2.7 u[IU]/mL (ref 0.35–5.50)

## 2012-07-14 NOTE — Progress Notes (Signed)
  Subjective:    Patient ID: Lisa Miles, female    DOB: 06-26-1950, 62 y.o.   MRN: [id number]  HPI New to establish.  GYNLloyd Huger.  PCP- none.  Cards- Herbie [facility])  HTN- chronic problem, following w/ Dr Herbie Baltimore.  Well controlled on Lisinopril and HCTZ.  No CP, SOB, HAs, visual changes.  + edema of LEs- this is not new for pt.  Groin pain- bilateral, started after doing heavy lifting.  Pain is intermittent.  Will occur w/ lifting.  Pain is described as a throbbing w/ a burn.  Pain is improving since onset.  sxs first occurred at beginning of year (~3 months ago)  Pt has not felt a bulge.  Health maintenance- UTD on colonoscopy (Dr Loreta Ave), mammo- Garald Braver, DEXA- Dr Lloyd Huger   Review of Systems For ROS see HPI     Objective:   Physical Exam  Vitals reviewed. Constitutional: She is oriented to [place], place, and time. She appears well-developed and well-nourished. No distress.  HENT:  Head: Normocephalic and atraumatic.  Eyes: Conjunctivae and EOM are normal. Pupils are equal, round, and reactive to light.  Neck: Normal range of motion. Neck supple. No thyromegaly present.  Cardiovascular: Normal rate, regular rhythm, normal heart sounds and intact distal pulses.   No murmur heard. Pulmonary/Chest: Effort normal and breath sounds normal. No respiratory distress.  Abdominal: Soft. She exhibits no distension and no mass (no palpable abdominal wall defect or hernia). There is no tenderness. There is no rebound and no guarding.  Musculoskeletal: She exhibits no edema.  Lymphadenopathy:    She has no cervical adenopathy.  Neurological: She is alert and oriented to [place], place, and time.  Skin: Skin is warm and dry.  Psychiatric: She has a normal mood and affect. Her behavior is normal.          Assessment & Plan:

## 2012-07-14 NOTE — Patient Instructions (Addendum)
Schedule your complete physical in 6 months We'll notify you of your lab results and make any changes if needed We'll call you with your surgery appt to evaluate your groin pain Call with any questions or concerns Welcome!  We're glad to have you!

## 2012-07-15 ENCOUNTER — Ambulatory Visit: Payer: BC Managed Care – PPO

## 2012-07-15 DIAGNOSIS — R7309 Other abnormal glucose: Secondary | ICD-10-CM

## 2012-07-15 LAB — HEMOGLOBIN A1C: Hgb A1c MFr Bld: 5.8 % (ref 4.6–6.5)

## 2012-07-17 NOTE — Assessment & Plan Note (Addendum)
New to provider, ongoing for pt.  Check labs to risk stratify.  No med changes at this time.

## 2012-07-17 NOTE — Assessment & Plan Note (Signed)
New.  Based on description, suspect pt has inguinal hernia despite lack of palpable bulge on PE.  Will refer to surgery for complete evaluation and tx.  Pt expressed understanding and is in agreement w/ plan.

## 2012-07-19 ENCOUNTER — Telehealth: Payer: Self-pay | Admitting: *Deleted

## 2012-07-19 NOTE — Telephone Encounter (Signed)
Spoke with the pt and informed her of recent lab results and note.  Pt understood and agreed.//AB/CMA

## 2012-07-19 NOTE — Telephone Encounter (Signed)
Message copied by Verdie Shire on Tue Jul 19, 2012 11:40 AM ------      Message from: Sheliah Hatch      Created: Fri Jul 15, 2012  8:03 AM       Please add A1C to labs due to elevated glucose      Remainder of labs look good ------

## 2012-07-22 ENCOUNTER — Ambulatory Visit (INDEPENDENT_AMBULATORY_CARE_PROVIDER_SITE_OTHER): Payer: BC Managed Care – PPO | Admitting: Surgery

## 2012-07-22 ENCOUNTER — Encounter (INDEPENDENT_AMBULATORY_CARE_PROVIDER_SITE_OTHER): Payer: Self-pay | Admitting: Surgery

## 2012-07-22 VITALS — BP 132/84 | HR 68 | Temp 97.3°F | Resp 14 | Ht 66.0 in | Wt 226.0 lb

## 2012-07-22 DIAGNOSIS — R109 Unspecified abdominal pain: Secondary | ICD-10-CM

## 2012-07-22 DIAGNOSIS — R1030 Lower abdominal pain, unspecified: Secondary | ICD-10-CM

## 2012-07-22 NOTE — Patient Instructions (Signed)
Will schedule CT to evaluate pain further.  Ibuprofen for pain.  No activity restrictions

## 2012-07-22 NOTE — Progress Notes (Signed)
Patient ID: Lisa Miles, female   DOB: [birth year], 62 y.o.   MRN: [id number]  No chief complaint on file.   [name] is a 62 y.o. female.  Patient sent at the request of Dr. Beverely Low for bilateral groin pain. The patient was lifting about 3 months ago a microwave and developed right groin pain afterwards. She now has some mild left groin pain as well. She done some lifting at work prior to this pushing an object away with her right foot and that was the initiating event that started her right groin pain. It is described as burning. There's been some improvement since 3 months ago but she does have intermittent bouts of pain in both groins with lifting. No nausea or vomiting. The pain is mild to moderate intensity burning in nature bilateral groins made worse with lifting HPI  Past Medical History  Diagnosis Date  . Asthma   . Hypertension   . Heart disease     Past Surgical History  Procedure Laterality Date  . Knee arthroscopy Right   . Bunionectomy    . Abdominal hysterectomy      Family History  Problem Relation Age of Onset  . Arthritis Mother   . Hyperlipidemia Mother   . Diabetes Mother   . Arthritis Father   . Hyperlipidemia Father   . Diabetes Father   . Cancer Father     Social History History  Substance Use Topics  . Smoking status: Never Smoker   . Smokeless tobacco: Not on file  . Alcohol Use: No    No Known Allergies  Current Outpatient Prescriptions  Medication Sig Dispense Refill  . aspirin 81 MG tablet Take 81 mg by mouth daily.      . calcium carbonate (OS-CAL) 600 MG TABS Take 600 mg by mouth 2 (two) times daily with a meal.      . cyclobenzaprine (FLEXERIL) 10 MG tablet Take one tablet at bedtime as a muscle relaxant  30 tablet  0  . estradiol (ESTRACE) 2 MG tablet Take 2 mg by mouth daily.      . Fish Oil-Cholecalciferol (OMEGA-3 FISH OIL/VITAMIN D3) 1000-1000 MG-UNIT CAPS Take by mouth.      . hydrochlorothiazide (HYDRODIURIL) 25 MG  tablet Take 25 mg by mouth daily.      [name] Kitchen lisinopril (PRINIVIL,ZESTRIL) 20 MG tablet Take 20 mg by mouth daily.      . meloxicam (MOBIC) 7.5 MG tablet Take 1-2 tablets daily with food for back pain.  30 tablet  0   No current facility-administered medications for this visit.    Review of Systems Review of Systems  Constitutional: Negative for fever, chills and unexpected weight change.  HENT: Negative for hearing loss, congestion, sore throat, trouble swallowing and voice change.   Eyes: Negative for visual disturbance.  Respiratory: Negative for cough and wheezing.   Cardiovascular: Negative for chest pain, palpitations and leg swelling.  Gastrointestinal: Negative for nausea, vomiting, abdominal pain, diarrhea, constipation, blood in stool, abdominal distention and anal bleeding.  Genitourinary: Negative for hematuria, vaginal bleeding and difficulty urinating.  Musculoskeletal: Negative for arthralgias.  Skin: Negative for rash and wound.  Neurological: Negative for seizures, syncope and headaches.  Hematological: Negative for adenopathy. Does not bruise/bleed easily.  Psychiatric/Behavioral: Negative for confusion.    Blood pressure 132/84, pulse 68, temperature 97.3 F (36.3 C), resp. rate 14, height 5\' 6"  (1.676 m), weight 226 lb (102.513 kg).  Physical Exam Physical Exam  Constitutional: She  is oriented to [place], place, and time. She appears well-developed and well-nourished.  HENT:  Head: Normocephalic and atraumatic.  Eyes: EOM are normal. Pupils are equal, round, and reactive to light.  Neck: Normal range of motion. Neck supple.  Pulmonary/Chest: Effort normal.  Abdominal: There is no tenderness. There is no rebound. No hernia. Hernia confirmed negative in the ventral area, confirmed negative in the right inguinal area and confirmed negative in the left inguinal area.  Musculoskeletal: Normal range of motion.  Neurological: She is alert and oriented to [place], place, and  time.  Skin: Skin is warm and dry.  Psychiatric: She has a normal mood and affect. Her behavior is normal. Judgment and thought content normal.    Data Reviewed Dr Beverely Low notes  Assessment    Bilateral inguinodynia    Plan    Check CT pelvis to evaluate further given persistent pain.  Further recs once this is done.      Linzee Depaul A. 07/22/2012, 10:33 AM

## 2012-07-26 ENCOUNTER — Other Ambulatory Visit (HOSPITAL_COMMUNITY): Payer: Self-pay | Admitting: Cardiology

## 2012-07-26 DIAGNOSIS — I359 Nonrheumatic aortic valve disorder, unspecified: Secondary | ICD-10-CM

## 2012-07-26 DIAGNOSIS — I059 Rheumatic mitral valve disease, unspecified: Secondary | ICD-10-CM

## 2012-07-26 DIAGNOSIS — I350 Nonrheumatic aortic (valve) stenosis: Secondary | ICD-10-CM

## 2012-07-26 DIAGNOSIS — I340 Nonrheumatic mitral (valve) insufficiency: Secondary | ICD-10-CM

## 2012-07-26 HISTORY — DX: Nonrheumatic mitral (valve) insufficiency: I34.0

## 2012-07-26 HISTORY — DX: Nonrheumatic aortic (valve) stenosis: I35.0

## 2012-07-27 ENCOUNTER — Other Ambulatory Visit: Payer: BC Managed Care – PPO

## 2012-07-29 ENCOUNTER — Ambulatory Visit
Admission: RE | Admit: 2012-07-29 | Discharge: 2012-07-29 | Disposition: A | Discharge status: Home / Self Care | Payer: BC Managed Care – PPO | Source: Ambulatory Visit | Attending: Surgery | Admitting: Surgery

## 2012-07-29 DIAGNOSIS — R1030 Lower abdominal pain, unspecified: Secondary | ICD-10-CM

## 2012-07-29 MED ORDER — IOHEXOL 300 MG/ML  SOLN
125.0000 mL | Freq: Once | INTRAMUSCULAR | Status: AC | PRN
  Administered 2012-07-29: 125 mL via INTRAVENOUS

## 2012-08-01 ENCOUNTER — Ambulatory Visit (INDEPENDENT_AMBULATORY_CARE_PROVIDER_SITE_OTHER): Payer: BC Managed Care – PPO | Admitting: Surgery

## 2012-08-01 ENCOUNTER — Encounter (INDEPENDENT_AMBULATORY_CARE_PROVIDER_SITE_OTHER): Payer: Self-pay | Admitting: Surgery

## 2012-08-01 VITALS — BP 118/68 | HR 90 | Temp 96.3°F | Ht 66.0 in | Wt 226.6 lb

## 2012-08-01 DIAGNOSIS — R109 Unspecified abdominal pain: Secondary | ICD-10-CM

## 2012-08-01 DIAGNOSIS — R1030 Lower abdominal pain, unspecified: Secondary | ICD-10-CM

## 2012-08-01 MED ORDER — TRAMADOL HCL 50 MG PO TABS: 50.0000 mg | ORAL_TABLET | Freq: Four times a day (QID) | ORAL | Status: DC | PRN

## 2012-08-01 NOTE — Patient Instructions (Signed)
Diagnostic Laparoscopy Laparoscopy is a surgical procedure. It is used to diagnose and treat diseases inside the belly(abdomen). It is usually a brief, common, and relatively simple procedure. The laparoscopeis a thin, lighted, pencil-sized instrument. It is like a telescope. It is inserted into your abdomen through a small cut (incision). Your caregiver can look at the organs inside your body through this instrument. He or she can see if there is anything abnormal. Laparoscopy can be done either in a hospital or outpatient clinic. You may be given a mild sedative to help you relax before the procedure. Once in the operating room, you will be given a drug to make you sleep (general anesthesia). Laparoscopy usually lasts less than 1 hour. After the procedure, you will be monitored in a recovery area until you are stable and doing well. Once you are home, it will take 2 to 3 days to fully recover. RISKS AND COMPLICATIONS  Laparoscopy has relatively few risks. Your caregiver will discuss the risks with you before the procedure. Some problems that can occur include:  Infection.  Bleeding.  Damage to other organs.  Anesthetic side effects. PROCEDURE Once you receive anesthesia, your surgeon inflates the abdomen with a harmless gas (carbon dioxide). This makes the organs easier to see. The laparoscope is inserted into the abdomen through a small incision. This allows your surgeon to see into the abdomen. Other small instruments are also inserted into the abdomen through other small openings. Many surgeons attach a video camera to the laparoscope to enlarge the view. During a diagnostic laparoscopy, the surgeon may be looking for inflammation, infection, or cancer. Your surgeon may take tissue samples(biopsies). The samples are sent to a specialist in looking at cells and tissue samples (pathologist). The pathologist examines them under a microscope. Biopsies can help to diagnose or confirm a  disease. AFTER THE PROCEDURE   The gas is released from inside the abdomen.  The incisions are closed with stitches (sutures). Because these incisions are small (usually less than 1/2 inch), there is usually minimal discomfort after the procedure. There may be some mild discomfort in the throat. This is from the tube placed in the throat while you were sleeping. You may have some mild abdominal discomfort. There may also be discomfort from the instrument placement incisions in the abdomen.  The recovery time is shortened as long as there are no complications.  You will rest in a recovery room until stable and doing well. As long as there are no complications, you may be allowed to go home. FINDING OUT THE RESULTS OF YOUR TEST Not all test results are available during your visit. If your test results are not back during the visit, make an appointment with your caregiver to find out the results. Do not assume everything is normal if you have not heard from your caregiver or the medical facility. It is important for you to follow up on all of your test results. HOME CARE INSTRUCTIONS   Take all medicines as directed.  Only take over-the-counter or prescription medicines for pain, discomfort, or fever as directed by your caregiver.  Resume daily activities as directed.  Showers are preferred over baths.  You may resume sexual activities in 1 week or as directed.  Do not drive while taking narcotics. SEEK MEDICAL CARE IF:   There is increasing abdominal pain.  There is new pain in the shoulders (shoulder strap areas).  You feel lightheaded or faint.  You have the chills.  You or your  child has an oral temperature above 102 F (38.9 C).  There is pus-like (purulent) drainage from any of the wounds.  You are unable to pass gas or have a bowel movement.  You feel sick to your stomach (nauseous) or throw up (vomit). MAKE SURE YOU:   Understand these instructions.  Will watch  your condition.  Will get help right away if you are not doing well or get worse. Document Released: 07/20/2000 Document Revised: 07/06/2011 Document Reviewed: 04/13/2007 [facility] Patient Information 2013 [place], [place].      Take medication every 6 hours around the clock the first week then as needed every 6 hours.

## 2012-08-01 NOTE — Progress Notes (Signed)
Subjective:     Patient ID: Lisa Miles, female   DOB: [birth year], 62 y.o.   MRN: [id number]  HPI Patient returns for reevaluation of her bilateral inguinal pain.   No change in symptoms. CT showed no evidence of inguinal hernia or other intra-abdominal or pelvic animality explain her pain. Small umbilical hernia noted.  Review of Systems  Constitutional: Negative.   HENT: Negative.   Psychiatric/Behavioral: Negative.        Objective:   Physical Exam  Constitutional: She appears well-developed and well-nourished.  HENT:  Head: Normocephalic and atraumatic.  Abdominal:  Not reexamined today.  Psychiatric: She has a normal mood and affect. Her behavior is normal. Thought content normal.   CT ABDOMEN AND PELVIS WITH CONTRAST  Technique: Multidetector CT imaging of the abdomen and pelvis was  performed following the standard protocol during bolus  administration of intravenous contrast.  Contrast: OMNIPAQUE IOHEXOL 300 MG/ML SOLN  Comparison: None  Findings: The lung bases are clear except for dependent  atelectasis. The heart is normal in size. No pericardial  effusion.  The liver is unremarkable. No worrisome lesions or biliary  dilatation. The gallbladder is normal. No common bile duct  dilatation. The pancreas is normal. The spleen is normal. The  adrenal glands and kidneys are normal.  The stomach, duodenum, small bowel and colon are unremarkable. No  inflammatory changes or mass lesions. The appendix is normal. No  mesenteric or retroperitoneal mass or adenopathy. The aorta is  normal in caliber. The major branch vessels are patent. There is a  small anterior abdominal wall hernia at the umbilicus containing  fat.  The uterus is surgically absent. No pelvic mass, adenopathy or  free pelvic fluid collections. Both ovaries are still present and  appear normal. No inguinal mass or adenopathy. A few scattered  borderline inguinal lymph nodes are noted.  The bony  structures are unremarkable.  IMPRESSION:  1. Unremarkable CT abdomen/pelvis. No acute abdominal/pelvic  findings, mass lesions or adenopathy.  2. A few borderline inguinal lymph nodes. Recommend clinical  correlation and follow-up.  3. Small anterior abdominal wall hernia at the umbilicus.  Original Report Authenticated By: Rudie Meyer, M.D.            Assessment:     Bilateral inguinal pain with no evidence of hernia  Small hernia umbilical    Plan:     Dry activity restriction and nonsteroidal anti-inflammatory medications. Discussed laparoscopy  As next step if pain not better in 3 months. She will call me depending on her symptoms. I told her take her tramadol every 6 hours around-the-clock for the next week and then every 6 hours as needed.

## 2012-08-03 ENCOUNTER — Ambulatory Visit (HOSPITAL_COMMUNITY)
Admission: RE | Admit: 2012-08-03 | Discharge: 2012-08-03 | Disposition: A | Discharge status: Home / Self Care | Payer: BC Managed Care – PPO | Source: Ambulatory Visit | Attending: Cardiology | Admitting: Cardiology

## 2012-08-03 DIAGNOSIS — I359 Nonrheumatic aortic valve disorder, unspecified: Secondary | ICD-10-CM | POA: Insufficient documentation

## 2012-08-03 DIAGNOSIS — R011 Cardiac murmur, unspecified: Secondary | ICD-10-CM | POA: Insufficient documentation

## 2012-08-03 DIAGNOSIS — I059 Rheumatic mitral valve disease, unspecified: Secondary | ICD-10-CM | POA: Insufficient documentation

## 2012-08-03 DIAGNOSIS — E785 Hyperlipidemia, unspecified: Secondary | ICD-10-CM | POA: Insufficient documentation

## 2012-08-03 NOTE — Progress Notes (Signed)
2D Echo Performed 08/03/2012    Heidi Lemay, RCS  

## 2012-09-09 ENCOUNTER — Ambulatory Visit: Payer: BC Managed Care – PPO

## 2012-09-09 ENCOUNTER — Ambulatory Visit (INDEPENDENT_AMBULATORY_CARE_PROVIDER_SITE_OTHER): Payer: BC Managed Care – PPO | Admitting: Family Medicine

## 2012-09-09 VITALS — BP 118/72 | HR 73 | Temp 99.1°F | Resp 16 | Ht 66.0 in | Wt 222.0 lb

## 2012-09-09 DIAGNOSIS — R05 Cough: Secondary | ICD-10-CM

## 2012-09-09 DIAGNOSIS — J310 Chronic rhinitis: Secondary | ICD-10-CM

## 2012-09-09 DIAGNOSIS — R059 Cough, unspecified: Secondary | ICD-10-CM

## 2012-09-09 DIAGNOSIS — N393 Stress incontinence (female) (male): Secondary | ICD-10-CM

## 2012-09-09 MED ORDER — AZITHROMYCIN 250 MG PO TABS: ORAL_TABLET | ORAL | Status: DC

## 2012-09-09 MED ORDER — IPRATROPIUM BROMIDE 0.03 % NA SOLN: 2.0000 | Freq: Two times a day (BID) | NASAL | Status: DC

## 2012-09-09 MED ORDER — ALBUTEROL SULFATE HFA 108 (90 BASE) MCG/ACT IN AERS: 2.0000 | INHALATION_SPRAY | RESPIRATORY_TRACT | Status: DC | PRN

## 2012-09-09 MED ORDER — BENZONATATE 100 MG PO CAPS: 100.0000 mg | ORAL_CAPSULE | Freq: Three times a day (TID) | ORAL | Status: DC | PRN

## 2012-09-09 MED ORDER — GUAIFENESIN ER 1200 MG PO TB12: 1.0000 | ORAL_TABLET | Freq: Two times a day (BID) | ORAL | Status: DC | PRN

## 2012-09-09 NOTE — Patient Instructions (Signed)
Get plenty of rest and drink at least 64 ounces of water daily. 

## 2012-09-09 NOTE — Progress Notes (Signed)
  Subjective:    Patient ID: Lisa Miles, female    DOB: Nov 07, 1950, 62 y.o.   MRN: [id number]  HPI This 62 y.o. female presents for evaluation of a cough. "I got a cold, not a full blown cold.  Tickle in my throat and real deep cough, which makes me wet myself."  Wheezing.  Previous h/o bronchitis.  Symptoms began 3 days ago, then worse 2 days ago. OTC daytime/nightime product with minimal relief.  No chest pain. No SOB. Some throat pain.  No ear pain/fullness. Increased nasal congestion, drainage.  Watery eyes.  Requests a chest xray today.  Past medical history, surgical history, family history, social history and problem list reviewed.   Review of Systems As above.    Objective:   Physical Exam Blood pressure 118/72, pulse 73, temperature 99.1 F (37.3 C), temperature source Oral, resp. rate 16, height 5\' 6"  (1.676 m), weight 222 lb (100.699 kg), SpO2 98.00%. Body mass index is 35.85 kg/(m^2). Well-developed, well nourished BF who is awake, alert and oriented, in NAD. Sniffling continuously during interview and exam. HEENT: [place]/AT, PERRL, EOMI.  Sclera and conjunctiva are clear.  EAC are patent, TMs are normal in appearance. Nasal mucosa is pink and moist. OP is clear. Neck: supple, non-tender, no lymphadenopathy, thyromegaly. Heart: RRR, III/VI systolic murmur noted. Lungs: normal effort, CTA Extremities: no cyanosis, clubbing or edema. Skin: warm and dry without rash. Psychologic: good mood and appropriate affect, normal speech and behavior.  CXR: [facility] reading (PRIMARY) by  Dr. Katrinka Blazing. Normal Chest.  No infiltrate, increased markings.     Assessment & Plan:  Cough - Plan: DG Chest 2 View, albuterol (PROVENTIL HFA;VENTOLIN HFA) 108 (90 BASE) MCG/ACT inhaler, benzonatate (TESSALON) 100 MG capsule, azithromycin (ZITHROMAX) 250 MG tablet (encouraged her to hold the Zpak initially, and fill only if her symptoms have not begun to improve on day 6-7 of this illness).  Rhinitis -  Plan: Guaifenesin (MUCINEX MAXIMUM STRENGTH) 1200 MG TB12, ipratropium (ATROVENT) 0.03 % nasal spray  Stress incontinence - see above.  Fernande Bras, PA-C Physician Assistant-Certified [facility]

## 2012-10-16 ENCOUNTER — Other Ambulatory Visit (HOSPITAL_COMMUNITY): Payer: Self-pay | Admitting: Cardiology

## 2012-10-18 NOTE — Telephone Encounter (Signed)
Rx was sent to pharmacy electronically. 

## 2012-10-25 ENCOUNTER — Other Ambulatory Visit (HOSPITAL_COMMUNITY): Payer: Self-pay | Admitting: Cardiology

## 2012-10-25 NOTE — Progress Notes (Signed)
Reviewed history and physical exam in detail with Porfirio Oar, PA-C.  CXR reviewed. Agree with assessment and plan.

## 2012-11-21 ENCOUNTER — Emergency Department (HOSPITAL_COMMUNITY)
Admission: EM | Admit: 2012-11-21 | Discharge: 2012-11-21 | Disposition: A | Discharge status: Home / Self Care | Payer: BC Managed Care – PPO | Attending: Emergency Medicine | Admitting: Emergency Medicine

## 2012-11-21 ENCOUNTER — Encounter (HOSPITAL_COMMUNITY): Payer: Self-pay | Admitting: Cardiology

## 2012-11-21 ENCOUNTER — Telehealth (INDEPENDENT_AMBULATORY_CARE_PROVIDER_SITE_OTHER): Payer: Self-pay

## 2012-11-21 DIAGNOSIS — R1031 Right lower quadrant pain: Secondary | ICD-10-CM

## 2012-11-21 DIAGNOSIS — M545 Low back pain, unspecified: Secondary | ICD-10-CM | POA: Insufficient documentation

## 2012-11-21 DIAGNOSIS — I519 Heart disease, unspecified: Secondary | ICD-10-CM | POA: Insufficient documentation

## 2012-11-21 DIAGNOSIS — I10 Essential (primary) hypertension: Secondary | ICD-10-CM | POA: Insufficient documentation

## 2012-11-21 DIAGNOSIS — Z79899 Other long term (current) drug therapy: Secondary | ICD-10-CM | POA: Insufficient documentation

## 2012-11-21 DIAGNOSIS — Z7982 Long term (current) use of aspirin: Secondary | ICD-10-CM | POA: Insufficient documentation

## 2012-11-21 DIAGNOSIS — J45909 Unspecified asthma, uncomplicated: Secondary | ICD-10-CM | POA: Insufficient documentation

## 2012-11-21 DIAGNOSIS — R109 Unspecified abdominal pain: Secondary | ICD-10-CM | POA: Insufficient documentation

## 2012-11-21 DIAGNOSIS — G8929 Other chronic pain: Secondary | ICD-10-CM | POA: Insufficient documentation

## 2012-11-21 DIAGNOSIS — K921 Melena: Secondary | ICD-10-CM | POA: Insufficient documentation

## 2012-11-21 LAB — URINALYSIS, ROUTINE W REFLEX MICROSCOPIC
Bilirubin Urine: NEGATIVE
Glucose, UA: NEGATIVE mg/dL
Hgb urine dipstick: NEGATIVE
Ketones, ur: NEGATIVE mg/dL
Leukocytes, UA: NEGATIVE
Nitrite: NEGATIVE
Protein, ur: NEGATIVE mg/dL
Specific Gravity, Urine: 1.016 (ref 1.005–1.030)
Urobilinogen, UA: 0.2 mg/dL (ref 0.0–1.0)
pH: 6.5 (ref 5.0–8.0)

## 2012-11-21 MED ORDER — DIAZEPAM 5 MG PO TABS: 5.0000 mg | ORAL_TABLET | Freq: Four times a day (QID) | ORAL | Status: DC | PRN

## 2012-11-21 MED ORDER — DIAZEPAM 5 MG/ML IJ SOLN: 5.0000 mg | Freq: Once | INTRAMUSCULAR | Status: DC

## 2012-11-21 MED ORDER — IBUPROFEN 600 MG PO TABS: 600.0000 mg | ORAL_TABLET | Freq: Four times a day (QID) | ORAL | Status: DC | PRN

## 2012-11-21 MED ORDER — DIAZEPAM 5 MG PO TABS
5.0000 mg | ORAL_TABLET | Freq: Once | ORAL | Status: AC
  Administered 2012-11-21: 5 mg via ORAL
  Filled 2012-11-21: qty 1

## 2012-11-21 NOTE — Telephone Encounter (Signed)
Pt called stating she was still having discomfort in groin area. Per note in epic she was to try anti inflammatories for 3 mo. Pt wanted to know if her insurance would pay for ER visit if she decides she needs to go. I advised pt I would not be able to determine this. Pt given appt with Dr Luisa Hart.

## 2012-11-21 NOTE — ED Notes (Signed)
Pt reports pelvic pain and vaginal burning for the past couple of months. States that she is also have lower back pain. States that she has been seen for the this prior and saw a Careers adviser but nothing was found. Pt reports that they offered to to a GI scope but she has not done so yet.

## 2012-11-21 NOTE — ED Provider Notes (Signed)
CSN: [id number]     Arrival date & time 11/21/12  1414 History     First MD Initiated Contact with Patient 11/21/12 1704     Chief Complaint  Patient presents with  . Groin Pain  . Pelvic Pain   HPI  Pt is 62 yo AA female with pmh of HTN, asthma, heart disease, and hysterectomy on oral HRT who presents with bilateral groin pain and burning for past 6 months that has been worse in the past 2-3 weeks. Pt reports that in January she picked up a heavy microwave after which she developed pain in her groin area without radiation that is also associated with burning. No numbness or tingling. Pain is sharp and constant worsened with positional change (rising form bed), standing, and walking.  She was seen by her OBGYN in March/April which CT abd pelvis revealed small umbilical hernia and few borderline inguinal lymph nodes. No further workup was needed at that time and she was prescribed tramadol to use as needed for pain. She feels that her symptoms have progressively worsened since they first began and is unable to be as physically active due to pain. She has had a hysterectomy in the past (in her 21s) and has since been on oral estrogen replacement.  She has had colonoscopies in the past that were positive for polyps. She reports possible blood in her stool about a month ago which was not associated with painful BM.        No fevers, chills, night sweating, weight changes, flank pain,  abdominal pain, changes in BM, nausa, vomiting, urinary symptoms, or hematuria. No pain with defecation or sex.  No vaginal bleeding, dryness or discharge. She does report increased pressure in her pubic region when she coughs, with no bulging.  She has had chronic low back pain that she attributes to her work.         Past Medical History  Diagnosis Date  . Asthma   . Hypertension   . Heart disease    Past Surgical History  Procedure Laterality Date  . Knee arthroscopy Right   . Bunionectomy    . Abdominal  hysterectomy     Family History  Problem Relation Age of Onset  . Arthritis Mother   . Hyperlipidemia Mother   . Diabetes Mother   . Arthritis Father   . Hyperlipidemia Father   . Diabetes Father   . Cancer Father    History  Substance Use Topics  . Smoking status: Never Smoker   . Smokeless tobacco: Never Used  . Alcohol Use: No   OB History   Grav Para Term Preterm Abortions TAB SAB Ect Mult Living                 Review of Systems  Constitutional: Positive for activity change (due to pain). Negative for fever, diaphoresis, appetite change, fatigue and unexpected weight change.  HENT: Negative for rhinorrhea and trouble swallowing.   Eyes: Negative for visual disturbance.  Respiratory: Negative for cough and shortness of breath.   Cardiovascular: Negative for chest pain, palpitations and leg swelling.  Gastrointestinal: Positive for blood in stool (1 month ago). Negative for nausea, vomiting, abdominal pain, diarrhea, constipation, anal bleeding and rectal pain.  Genitourinary: Negative for dysuria, flank pain, difficulty urinating and dyspareunia.  Musculoskeletal: Positive for back pain (chronic low back pain ). Negative for myalgias and arthralgias.  Skin: Negative for rash.  Neurological: Negative for light-headedness, numbness and headaches.    Allergies  Review of patient's allergies indicates no known allergies.  Home Medications   Current Outpatient Rx  Name  Route  Sig  Dispense  Refill  . aspirin EC 81 MG tablet   Oral   Take 81 mg by mouth daily.         . calcium carbonate (OS-CAL) 600 MG TABS   Oral   Take 600 mg by mouth 2 (two) times daily with a meal.         . Cholecalciferol (VITAMIN D-3) 1000 UNITS CAPS   Oral   Take 1 capsule by mouth daily.         [name] Kitchen estradiol (ESTRACE) 2 MG tablet   Oral   Take 2 mg by mouth daily.         . hydrochlorothiazide (HYDRODIURIL) 25 MG tablet   Oral   Take 25 mg by mouth daily.         [name] Kitchen  lisinopril (PRINIVIL,ZESTRIL) 20 MG tablet   Oral   Take 20 mg by mouth daily.          BP 104/62  Pulse 72  Temp(Src) 98.5 F (36.9 C)  Resp 18  SpO2 95% Physical Exam  Constitutional: She is oriented to [place], place, and time. She appears well-developed and well-nourished. No distress.  HENT:  Head: Normocephalic and atraumatic.  Eyes: EOM are normal.  Neck: Normal range of motion. Neck supple.  Cardiovascular: Normal rate, regular rhythm and normal heart sounds.   Pulmonary/Chest: Effort normal and breath sounds normal. No respiratory distress. She has no wheezes. She has no rales. She exhibits no tenderness.  Genitourinary: Vagina normal.  Musculoskeletal: She exhibits no edema and no tenderness.  Pain in pubic area  with internal rotation of hips      Neurological: She is alert and oriented to [place], place, and time.  Skin: Skin is warm and dry. She is not diaphoretic.  Psychiatric: Her behavior is normal.    ED Course   Procedures (including critical care time)  Labs Reviewed  URINALYSIS, ROUTINE W REFLEX MICROSCOPIC   No results found. 1. Bilateral groin pain     MDM  Assessment: 62 yo AA female with pmh of HTN, asthma, heart disease, and hysterectomy on oral HRT who presents with bilateral groin pain and burning for past six months that has been worse in the past 2-3 weeks.     Plan:  Pubic Burning & Pain - Groin strain vs Indirect Inguinal/femoral hernia vs vaginal atrophy  -Obtain UA ---> negative  -Administer PO valium 5mg  for muscle spasm     Disposition: Home --->Pt afebrile with bilateral  non-radiating groin pain with no urinary symptoms (negative UA) and improved pain with anti-spasmodic medicaton. Symptoms most likely due to muscle strain vs inguinal /femoral hernia considering onset of symptoms with heavy lifting and pain with internal rotation of hips. However due to chronicity (>6 months) and worsening of symptoms would recommend receive  MRI imaging of pelvis. CT abd/pelvis was done 4 months ago.     Discharge Instructions: -To take 1 tab valium 5mg  Q6hrs as needed for muscle spasms -To take 1 tab ibuprofen 600mg  Q6hrs as needed for pain     -To avoid heavy lifting and strenuous exercise  -To follow-up with Dr. Luisa Hart on 8/7          Otis Brace, MD 11/21/12 2309

## 2012-11-23 NOTE — ED Provider Notes (Signed)
I saw and evaluated the patient with the resident physician.  Documentation is appropriate and accurate, with no amendments.  On my exam she was in no distress, awake, alert, AOx3.     Gerhard Munch, MD 11/23/12 [phone]

## 2012-12-01 ENCOUNTER — Encounter (INDEPENDENT_AMBULATORY_CARE_PROVIDER_SITE_OTHER): Payer: BC Managed Care – PPO | Admitting: Surgery

## 2012-12-01 ENCOUNTER — Telehealth (INDEPENDENT_AMBULATORY_CARE_PROVIDER_SITE_OTHER): Payer: Self-pay | Admitting: General Surgery

## 2012-12-01 ENCOUNTER — Ambulatory Visit (INDEPENDENT_AMBULATORY_CARE_PROVIDER_SITE_OTHER): Payer: BC Managed Care – PPO | Admitting: Surgery

## 2012-12-01 ENCOUNTER — Encounter (INDEPENDENT_AMBULATORY_CARE_PROVIDER_SITE_OTHER): Payer: Self-pay | Admitting: Surgery

## 2012-12-01 VITALS — BP 130/70 | HR 72 | Temp 97.6°F | Resp 15 | Ht 66.0 in | Wt 225.0 lb

## 2012-12-01 DIAGNOSIS — R109 Unspecified abdominal pain: Secondary | ICD-10-CM

## 2012-12-01 DIAGNOSIS — M25559 Pain in unspecified hip: Secondary | ICD-10-CM

## 2012-12-01 DIAGNOSIS — M25551 Pain in right hip: Secondary | ICD-10-CM

## 2012-12-01 DIAGNOSIS — M549 Dorsalgia, unspecified: Secondary | ICD-10-CM

## 2012-12-01 DIAGNOSIS — R1030 Lower abdominal pain, unspecified: Secondary | ICD-10-CM

## 2012-12-01 NOTE — Telephone Encounter (Signed)
Spoke to pt and informed her that we have set up her MR spine and pelvis for 12/05/12 at 8:00 w/ arrival time of 7:30 at [facility] located at [address].

## 2012-12-01 NOTE — Patient Instructions (Signed)
Will set up MRI of pelvis and spine .  Further treatment depends on these.

## 2012-12-01 NOTE — Progress Notes (Signed)
Subjective:     Patient ID: Lisa Miles, female   DOB: 27-Aug-1950, 62 y.o.   MRN: [id number]  HPI Patient returns for reevaluation of her bilateral inguinal pain.  She was seen in the emergency room for worsening of her groin pain with radiation now to her vagina and the back of her thighs.. CT showed no evidence of inguinal hernia or other intra-abdominal or pelvic animality explain her pain. Small umbilical hernia noted. She also has back pain and continues to work with worsening of her symptoms. No dysuria. Previous evaluation was negative. NSAID use has not helped her pain. No nausea or vomiting. No vaginal discharge. Some radiation of pain into both thighs from her back.  Review of Systems  Constitutional: Negative.   HENT: Negative.   Psychiatric/Behavioral: Negative.        Objective:   Physical Exam  Constitutional: She appears well-developed and well-nourished.  HENT:  Head: Normocephalic and atraumatic.  Abdominal:  Soft nontender. No evidence of inguinal or femoral hernia bilaterally. Pain with medial rotation of size. No vaginal discharge.  Psychiatric: She has a normal mood and affect. Her behavior is normal. Thought content normal.  Neuro: no evidence of lower extremity weakness. CT ABDOMEN AND PELVIS WITH CONTRAST  Technique: Multidetector CT imaging of the abdomen and pelvis was  performed following the standard protocol during bolus  administration of intravenous contrast.  Contrast: OMNIPAQUE IOHEXOL 300 MG/ML SOLN  Comparison: None  Findings: The lung bases are clear except for dependent  atelectasis. The heart is normal in size. No pericardial  effusion.  The liver is unremarkable. No worrisome lesions or biliary  dilatation. The gallbladder is normal. No common bile duct  dilatation. The pancreas is normal. The spleen is normal. The  adrenal glands and kidneys are normal.  The stomach, duodenum, small bowel and colon are unremarkable. No  inflammatory  changes or mass lesions. The appendix is normal. No  mesenteric or retroperitoneal mass or adenopathy. The aorta is  normal in caliber. The major branch vessels are patent. There is a  small anterior abdominal wall hernia at the umbilicus containing  fat.  The uterus is surgically absent. No pelvic mass, adenopathy or  free pelvic fluid collections. Both ovaries are still present and  appear normal. No inguinal mass or adenopathy. A few scattered  borderline inguinal lymph nodes are noted.  The bony structures are unremarkable.  IMPRESSION:  1. Unremarkable CT abdomen/pelvis. No acute abdominal/pelvic  findings, mass lesions or adenopathy.  2. A few borderline inguinal lymph nodes. Recommend clinical  correlation and follow-up.  3. Small anterior abdominal wall hernia at the umbilicus.  Original Report Authenticated By: Rudie Meyer, M.D.            Assessment:     Bilateral inguinal pain with no evidence of hernia  Small hernia umbilical  Back pain with radiation to pelvis and thighs    Plan:     Unfortunately I do not have a good explanation for her pain. She denies back pain with radiation and I have to wonder if she has a lumbar disc problem. She also has pain in both hip joints with medial rotation of her thighs. MRI of lumbar spine and pelvis would be helpful given her negative CT scan of the past and continued and worsened pain. If these are negative for explanation for pain, laparoscopy could be done. Her pain improved with benzodiazepines which  May indicate muscle spasm or irritability. Further care after  workup done.

## 2012-12-05 ENCOUNTER — Other Ambulatory Visit: Payer: BC Managed Care – PPO

## 2012-12-07 ENCOUNTER — Ambulatory Visit
Admission: RE | Admit: 2012-12-07 | Discharge: 2012-12-07 | Disposition: A | Discharge status: Home / Self Care | Payer: BC Managed Care – PPO | Source: Ambulatory Visit | Attending: Surgery | Admitting: Surgery

## 2012-12-07 DIAGNOSIS — R1030 Lower abdominal pain, unspecified: Secondary | ICD-10-CM

## 2012-12-07 DIAGNOSIS — M25551 Pain in right hip: Secondary | ICD-10-CM

## 2012-12-07 DIAGNOSIS — M549 Dorsalgia, unspecified: Secondary | ICD-10-CM

## 2012-12-08 ENCOUNTER — Telehealth (INDEPENDENT_AMBULATORY_CARE_PROVIDER_SITE_OTHER): Payer: Self-pay

## 2012-12-08 NOTE — Telephone Encounter (Signed)
Message copied by Brennan Bailey on Thu Dec 08, 2012  2:57 PM ------      Message from: Harriette Bouillon A      Created: Thu Dec 08, 2012  7:21 AM       NO HERNIA OR LIGAMENT TEAR. ------

## 2012-12-08 NOTE — Telephone Encounter (Signed)
I called patient but no answer or VM available. MR was negative for hernia and ligament tear. No answer for her pain. Follow up at scheduled appt time to discuss results.

## 2012-12-23 ENCOUNTER — Encounter (INDEPENDENT_AMBULATORY_CARE_PROVIDER_SITE_OTHER): Payer: Self-pay | Admitting: Surgery

## 2012-12-23 ENCOUNTER — Ambulatory Visit (INDEPENDENT_AMBULATORY_CARE_PROVIDER_SITE_OTHER): Payer: BC Managed Care – PPO | Admitting: Surgery

## 2012-12-23 VITALS — BP 140/84 | HR 68 | Temp 97.6°F | Resp 14 | Ht 66.0 in | Wt 225.8 lb

## 2012-12-23 DIAGNOSIS — R1030 Lower abdominal pain, unspecified: Secondary | ICD-10-CM | POA: Insufficient documentation

## 2012-12-23 DIAGNOSIS — R109 Unspecified abdominal pain: Secondary | ICD-10-CM

## 2012-12-23 MED ORDER — PREDNISONE (PAK) 10 MG PO TABS: 10.0000 mg | ORAL_TABLET | Freq: Every day | ORAL | Status: DC

## 2012-12-23 MED ORDER — TRAMADOL HCL 50 MG PO TABS: 50.0000 mg | ORAL_TABLET | Freq: Four times a day (QID) | ORAL | Status: DC | PRN

## 2012-12-23 MED ORDER — METHOCARBAMOL 500 MG PO TABS: 500.0000 mg | ORAL_TABLET | Freq: Four times a day (QID) | ORAL | Status: DC

## 2012-12-23 NOTE — Patient Instructions (Signed)
Take prednisone as directed.  Take muscle relaxant every 6 hours as needed for pain. Avoid lifting if possible.  Return as needed.

## 2012-12-23 NOTE — Progress Notes (Signed)
Subjective:     Patient ID: Lisa Miles, female   DOB: [birth year], 62 y.o.   MRN: [id number]  HPI Patient returns for reevaluation of her bilateral inguinal pain.  She was seen in the emergency room for worsening of her groin pain with radiation now to her vagina and the back of her thighs.. CT showed no evidence of inguinal hernia or other intra-abdominal or pelvic animality explain her pain. Small umbilical hernia noted. She also has back pain and continues to work with worsening of her symptoms. No dysuria. Previous evaluation was negative.  Pelvic MRI shows osteitis pubis.   No other cause of pain identified.  NSAID use has not helped her pain. No nausea or vomiting. No vaginal discharge. Some radiation of pain into both thighs from her back.  Review of Systems  Constitutional: Negative.   HENT: Negative.   Psychiatric/Behavioral: Negative.        Objective:   Physical Exam  Constitutional: She appears well-developed and well-nourished.  HENT:  Head: Normocephalic and atraumatic.  Abdominal:  Not repeated Psychiatric: She has a normal mood and affect. Her behavior is normal. Thought content normal.  Neuro: no evidence of lower extremity weakness. CT ABDOMEN AND PELVIS WITH CONTRAST  Technique: Multidetector CT imaging of the abdomen and pelvis was  performed following the standard protocol during bolus  administration of intravenous contrast.  Contrast: OMNIPAQUE IOHEXOL 300 MG/ML SOLN  Comparison: None  Findings: The lung bases are clear except for dependent  atelectasis. The heart is normal in size. No pericardial  effusion.  The liver is unremarkable. No worrisome lesions or biliary  dilatation. The gallbladder is normal. No common bile duct  dilatation. The pancreas is normal. The spleen is normal. The  adrenal glands and kidneys are normal.  The stomach, duodenum, small bowel and colon are unremarkable. No  inflammatory changes or mass lesions. The appendix is  normal. No  mesenteric or retroperitoneal mass or adenopathy. The aorta is  normal in caliber. The major branch vessels are patent. There is a  small anterior abdominal wall hernia at the umbilicus containing  fat.  The uterus is surgically absent. No pelvic mass, adenopathy or  free pelvic fluid collections. Both ovaries are still present and  appear normal. No inguinal mass or adenopathy. A few scattered  borderline inguinal lymph nodes are noted.  The bony structures are unremarkable.  IMPRESSION:  1. Unremarkable CT abdomen/pelvis. No acute abdominal/pelvic  findings, mass lesions or adenopathy.  2. A few borderline inguinal lymph nodes. Recommend clinical  correlation and follow-up.  3. Small anterior abdominal wall hernia at the umbilicus.  Original Report Authenticated By: Rudie Meyer, M.D.       Clinical Data: Low back pain radiating to the groin and upper  thighs.  MRI PELVIS WITHOUT CONTRAST  Technique: Multi-planar multi-sequence MR imaging of the pelvis  was performed following the standard protocol. No intravenous  contrast was administered.  Comparison: 07/29/2012; 01/30/2005  Findings: No hip effusion or findings of avascular necrosis.  Abnormal symmetric osseous edema in both pubic bodies noted. No  tear of the adjacent aponeurotic attachments observed.  Proximal hamstring tendons intact. No significant regional  bursitis. The description of lower lumbar degenerative disc  disease deferred to lumbar spine MRI.  Hip adductor musculature unremarkable. No sciatic notch  impingement or asymmetric signal within the sciatic nerves. No  obturator impingement noted.  Urinary bladder unremarkable. Uterus absent. Sigmoid  diverticulosis. No significant edema along the sacroiliac joints  noted.  IMPRESSION:  1. Abnormal osseous edema in both pubic bodies favoring osteitis  pubis.  2. Sigmoid diverticulosis.  Original Report Authenticated By: Gaylyn Rong,  M.D.      Assessment:     Bilateral inguinal pain with no evidence of hernia  Small hernia umbilical  Osteitis pubis    Plan:       Cannot find any anatomical cause of pain except osteitis pubis. Will try short course of prednisone and muscle relaxants to see if this helps.  If not  Will need to refer to pain clinic.  Limit lifting if possible.

## 2013-01-04 ENCOUNTER — Encounter: Payer: Self-pay | Admitting: Cardiology

## 2013-01-05 ENCOUNTER — Encounter: Payer: Self-pay | Admitting: Cardiology

## 2013-01-05 ENCOUNTER — Ambulatory Visit (INDEPENDENT_AMBULATORY_CARE_PROVIDER_SITE_OTHER): Payer: BC Managed Care – PPO | Admitting: Cardiology

## 2013-01-05 VITALS — BP 120/70 | HR 82 | Ht 66.0 in | Wt 225.7 lb

## 2013-01-05 DIAGNOSIS — R600 Localized edema: Secondary | ICD-10-CM

## 2013-01-05 DIAGNOSIS — I059 Rheumatic mitral valve disease, unspecified: Secondary | ICD-10-CM

## 2013-01-05 DIAGNOSIS — I10 Essential (primary) hypertension: Secondary | ICD-10-CM

## 2013-01-05 DIAGNOSIS — R0789 Other chest pain: Secondary | ICD-10-CM

## 2013-01-05 DIAGNOSIS — I359 Nonrheumatic aortic valve disorder, unspecified: Secondary | ICD-10-CM

## 2013-01-05 DIAGNOSIS — E785 Hyperlipidemia, unspecified: Secondary | ICD-10-CM

## 2013-01-05 DIAGNOSIS — R609 Edema, unspecified: Secondary | ICD-10-CM

## 2013-01-05 NOTE — Patient Instructions (Addendum)
As we discussed - some of your swelling is related to poor Vein circulation in your legs -- a function of gravity. Wearing support hose will help.  Your blood Pressure & Heart Rate look good.  You are due for some lab checks - Cholesterol & Blood Chemistries.  I will see you back in 6 months - after your follow-up echocardiogram.  Marykay Lex, MD

## 2013-01-21 ENCOUNTER — Encounter: Payer: Self-pay | Admitting: Cardiology

## 2013-01-21 DIAGNOSIS — R600 Localized edema: Secondary | ICD-10-CM | POA: Insufficient documentation

## 2013-01-21 NOTE — Assessment & Plan Note (Signed)
Overall, this seems to be her major complaint today. Unfortunately a lot of edema of the right side is probably related to her common femoral vein insufficiency. There is no evidence of superficial vein insufficiency.  Plan: Discussed compression stockings versus for her. She will try to get back to using her compression stockings. I asked about elevating her feet when possible.  I don't think that the edema is at all related to cor pulmonale or any right-sided heart failure symptoms.

## 2013-01-21 NOTE — Assessment & Plan Note (Signed)
Not currently on any type of dyslipidemia medications. Her last labs were actually relatively good from this spring. She is due now for followup labs.  Plan:Lipid panel and CMP

## 2013-01-21 NOTE — Progress Notes (Signed)
PCP: Neena Rhymes, MD  Clinic Note: Chief Complaint  Patient presents with  . 5 MONTH VISIT    little chest pressure, no sob ,edema   HPI: Lisa Miles is a 62 y.o. female with a PMH below who presents today for essentially an early six-month followup. She is to call has been following up for her valvular heart disease. She had not had an echo done from 2010-2014, in that interval she had expected progression of disease but not yet to significant extent. She also noted lower extremity edema.  Interval History: She presents today now mostly noting the swelling is worse on the right than the left. She has right sec calf cramping at night. She does have some skin discoloration and swelling with some mild varicose veins that she shows me. She says her edema is usually worse at this with dependent, after being or on her feet for long period time. She really has not been that active since I last saw her, noting that she really does not get much and when he exercised like she used to. She has noted occasional episodes of chest pressure, and the epigastric/lower thoracic cavity the sheaths of the nose case likely he also sometimes at rest. Really does not notice it with exertion however. With the edema, she really does not have much the way of any PND or orthopnea. She is a rare lightheadedness and dizziness but no syncope near syncope. No TIA or RCA symptoms. No melena, hematochezia or hematuria. He just denies any claudication symptoms. She does have mild dyspnea on exertion, but denies any out right heart failure to her symptoms. Just denies any significant palpitations or rapid heartbeat.  The remainder of Cardiovascular ROS: positive for - chest pain and edema negative for - dyspnea on exertion, irregular heartbeat, loss of consciousness, orthopnea, palpitations, paroxysmal nocturnal dyspnea, rapid heart rate or shortness of breath is as follows: Additional cardiac review of  systems:  Melena - no, hematochezia no; hematuria - no; nosebleeds - no; claudication - no  Past Medical History  Diagnosis Date  . Heart disease   . Aortic stenosis, mild 07/2017    TEE  -- mild to moderate aortic stenosis which is a little more significant than the mild stenosis & moderate regurgitation which is stable. MILD mitral valve prolapse w/moderate regurgitation but no stenosis. Mild elevated pulmonary pressures were noted w/moderate tricuspid regurgitation. EF = 55-60% with grade 1 diastolic dysfunction.  . Mitral regurgitation due to cusp prolapse 07/2017    TEE - mild to moderate aortic stenosis which is a little more significant than the mild stenosis & moderate regurgitation which is stable. MILD mitral valve prolapse w/moderate regurgitation but no stenosis. Mild elevated pulmonary pressures were noted w/moderate tricuspid regurgitation. EF = 55-60% with grade 1 diastolic dysfunction.  . Hyperlipidemia   . Lower extremity edema     Chronic. Venous Duplex 11/06/11 SUMMARY: 1) Bilateral Lower Extremities: No evidence of DVT or thrombophlebitis.  2) Right Common Femoral Vein: Demonstrated mild valvular insufficiency with a greater than (1) sec of duration. Mildly abnormal LE Venous duplex Doppler.  . Hypertension     Good control  . Palpitations     Relatively well controlled  . Obesity   . Bronchitis, chronic   . Asthma   . Inguinodynia     Bilateral groin pain  . Bilateral bunions     Bunionectomies performed  . Scoliosis     DG Chest 2 View x-ray on  07/30/11 by Dr. Cleta Alberts shows a scoliosis.    Prior Cardiac Evaluation and Past Surgical History: Past Surgical History  Procedure Laterality Date  . Bunionectomy Bilateral   . Knee arthroscopy Right   . Abdominal hysterectomy    . Ankle arthroscopy Left   . Left knee open surgery    . Total abdominal hysterectomy      Lower Extremity Venous Dopplers in the past showed no evidence of DVT, phlebitis in the right or left  lower extremity. There was mild right common femoral vein valvular insufficiency.  No Known Allergies  Current Outpatient Prescriptions  Medication Sig Dispense Refill  . aspirin EC 81 MG tablet Take 81 mg by mouth daily.      . calcium carbonate (OS-CAL) 600 MG TABS Take 600 mg by mouth 2 (two) times daily with a meal.      . Cholecalciferol (VITAMIN D-3) 1000 UNITS CAPS Take 1 capsule by mouth daily.      . diazepam (VALIUM) 5 MG tablet Take 1 tablet (5 mg total) by mouth every 6 (six) hours as needed for anxiety (spasms).  10 tablet  0  . estradiol (ESTRACE) 2 MG tablet Take 2 mg by mouth daily.      . hydrochlorothiazide (HYDRODIURIL) 25 MG tablet Take 25 mg by mouth daily.      [name] Kitchen lisinopril (PRINIVIL,ZESTRIL) 20 MG tablet Take 20 mg by mouth daily.      . methocarbamol (ROBAXIN) 500 MG tablet Take 1 tablet (500 mg total) by mouth 4 (four) times daily.  30 tablet  1  . traMADol (ULTRAM) 50 MG tablet Take 1 tablet (50 mg total) by mouth every 6 (six) hours as needed for pain.  30 tablet  0   No current facility-administered medications for this visit.    History   Social History Narrative   Lives with her husband.  Their children live nearby.   ROS: A comprehensive Review of Systems - Negative except Pertinent symptoms noted above.  PHYSICAL EXAM BP 120/70  Pulse 82  Ht 5\' 6"  (1.676 m)  Wt 225 lb 11.2 oz (102.377 kg)  BMI 36.45 kg/m2 General appearance: Very pleasant, healthy-appearing Afro-American woman. No acute distress, alert and oriented x3. Answers questions appropriately. Well-nourished and well-groomed. Normal mood and affect . Neck: no adenopathy, no carotid bruit, no JVD and supple, symmetrical, trachea midline Lungs: clear to auscultation bilaterally, normal percussion bilaterally and Nonlabored, good air movement. Heart: normal apical impulse, regular rate and rhythm, S1, S2 normal, no S3 or S4, systolic murmur: systolic ejection 2/6, medium pitch, crescendo,  decrescendo and Mid peaking radiates to carotids, 2nd systolic murmur: holosystolic 2/6, blowing at lower left sternal border, at apex, radiates to axilla, no click, no rub and No clear diastolic murmur heard Abdomen: soft, non-tender; bowel sounds normal; no masses,  no organomegaly Extremities: edema 1-2+ on the right, trace to 1+ on the left. Not currently wearing compression stockings., varicose veins noted and venous stasis dermatitis noted Pulses: 2+ and symmetric Neurologic: Grossly normal  HEENT: [place]/AT, EOMI, MMM, anicteric sclera  NWG:[id number] today: Yes Rate: 82 , Rhythm: NSR with sinus arrhythmia, NsST;    Recent Labs: Lipids from 06/2012 in Epic - essentiall @ [facility]  ASSESSMENT / PLAN: Lower extremity edema Overall, this seems to be her major complaint today. Unfortunately a lot of edema of the right side is probably related to her common femoral vein insufficiency. There is no evidence of superficial vein insufficiency.  Plan: Discussed compression  stockings versus for her. She will try to get back to using her compression stockings. I asked about elevating her feet when possible.  I don't think that the edema is at all related to cor pulmonale or any right-sided heart failure symptoms.  Mitral regurgitation due to cusp prolapse - moderate MR with mild prolapse She had mild progression of her valvular disease on her recent echocardiogram, but this was over the past 4 years. She remained asymptomatic with the exception of mild dyspnea on exertion.  Will recheck echo in the spring of 2015.  AORTIC VALVE DISORDERS - mild stenosis with moderate regurgitation Relatively stable, no active symptoms.  Followup echo in 2015  HTN (hypertension) Relatively stable.  Plan: Continue ACE inhibitor for afterload reduction insignificant aortic regurgitation.  HYPERLIPIDEMIA Not currently on any type of dyslipidemia medications. Her last labs were actually relatively good from this  spring. She is due now for followup labs.  Plan:Lipid panel and CMP   Orders Placed This Encounter  Procedures  . Lipid Profile  . Comp Met (CMET)  . EKG 12-Lead  . 2D Echocardiogram without contrast    Standing Status: Future     Number of Occurrences:      Standing Expiration Date: 07/25/2013    Scheduling Instructions:     Schedule in 6 months -march 2015    Order Specific Question:  Type of Echo    Answer:  Complete    Order Specific Question:  Where should this test be performed    Answer:  [facility]    Order Specific Question:  Reason for exam-Echo    Answer:  Mitral Valve Disorder  424.0    Order Specific Question:  Reason for exam-Echo    Answer:  Aortic Valve Disorder 424.1   No orders of the defined types were placed in this encounter.    Followup: 6 months  Abagail Limb W. Herbie Baltimore, M.D., M.S. [facility] [address] [place], [place]  [address]  [phone] Pager # [phone]

## 2013-01-21 NOTE — Assessment & Plan Note (Signed)
Relatively stable.  Plan: Continue ACE inhibitor for afterload reduction insignificant aortic regurgitation.

## 2013-01-21 NOTE — Assessment & Plan Note (Addendum)
She had mild progression of her valvular disease on her recent echocardiogram, but this was over the past 4 years. She remained asymptomatic with the exception of mild dyspnea on exertion.  Will recheck echo in the spring of 2015.

## 2013-01-21 NOTE — Assessment & Plan Note (Signed)
Relatively stable, no active symptoms.  Followup echo in 2015

## 2013-01-24 ENCOUNTER — Encounter (INDEPENDENT_AMBULATORY_CARE_PROVIDER_SITE_OTHER): Payer: Self-pay

## 2013-02-01 ENCOUNTER — Ambulatory Visit (HOSPITAL_COMMUNITY)
Admission: RE | Admit: 2013-02-01 | Discharge: 2013-02-01 | Disposition: A | Discharge status: Home / Self Care | Payer: BC Managed Care – PPO | Source: Ambulatory Visit | Attending: Cardiology | Admitting: Cardiology

## 2013-02-01 DIAGNOSIS — I059 Rheumatic mitral valve disease, unspecified: Secondary | ICD-10-CM

## 2013-02-01 DIAGNOSIS — I359 Nonrheumatic aortic valve disorder, unspecified: Secondary | ICD-10-CM | POA: Insufficient documentation

## 2013-02-01 NOTE — Progress Notes (Signed)
2D Echo Performed 02/01/2013    Lesli Issa, RCS  

## 2013-02-06 ENCOUNTER — Telehealth: Payer: Self-pay | Admitting: *Deleted

## 2013-02-06 NOTE — Telephone Encounter (Signed)
Message copied by Tobin Chad on Mon Feb 06, 2013  3:06 PM ------      Message from: Marykay Lex      Created: Wed Feb 01, 2013  4:52 PM       Stable valvular disease - no change (this was supposed to be April of 2015) - guess she won't need one then      Marykay Lex, MD       ------

## 2013-02-06 NOTE — Telephone Encounter (Signed)
Left message on patient voicemail. If any question may call back.

## 2013-02-15 ENCOUNTER — Other Ambulatory Visit (INDEPENDENT_AMBULATORY_CARE_PROVIDER_SITE_OTHER): Payer: Self-pay | Admitting: Surgery

## 2013-02-17 ENCOUNTER — Telehealth (INDEPENDENT_AMBULATORY_CARE_PROVIDER_SITE_OTHER): Payer: Self-pay

## 2013-02-17 NOTE — Telephone Encounter (Signed)
Pt calling requesting refill of Robaxin [ methocarbamol] to be called to [facility] at [facility] [address]. Pt states she still has groin discomfort. No swelling. No fever. No redness.  I reviewed last note by Dr Luisa Hart with pt. Pt advised I will need to send this to Dr Luisa Hart and Marcelino Duster for review. Pt advised it may be Monday before she receives a call back re: medication refill. Pt can be reached at [phone].

## 2013-02-17 NOTE — Telephone Encounter (Signed)
Called pt and let her know refill went to pharmacy.

## 2013-02-23 ENCOUNTER — Encounter: Payer: Self-pay | Admitting: Cardiology

## 2013-02-26 LAB — HM PAP SMEAR: HM Pap smear: NORMAL

## 2013-03-27 ENCOUNTER — Telehealth: Payer: Self-pay

## 2013-03-27 NOTE — Telephone Encounter (Signed)
Medication and allergies: reviewed and updated  90 day supply/mail order: na Local pharmacy: [facility] and [address]   Immunizations due:  Maybe get flu vaccine  A/P:   No changes to FH or PSH or personal hx Pap--Dr Neil---02/2013 [facility]--01/2013--next in 6 months--monitor L breast for cancer Bone Density--[facility]--date unkown [facility]--Dr Mann--date unknown  To Discuss with Provider: Not at this time

## 2013-03-28 ENCOUNTER — Encounter: Payer: Self-pay | Admitting: Family Medicine

## 2013-03-28 ENCOUNTER — Ambulatory Visit (INDEPENDENT_AMBULATORY_CARE_PROVIDER_SITE_OTHER): Payer: BC Managed Care – PPO | Admitting: Family Medicine

## 2013-03-28 VITALS — BP 122/72 | HR 69 | Temp 98.4°F | Resp 16 | Ht 65.75 in | Wt 226.1 lb

## 2013-03-28 DIAGNOSIS — Z23 Encounter for immunization: Secondary | ICD-10-CM

## 2013-03-28 DIAGNOSIS — Z0000 Encounter for general adult medical examination without abnormal findings: Secondary | ICD-10-CM | POA: Insufficient documentation

## 2013-03-28 DIAGNOSIS — E010 Iodine-deficiency related diffuse (endemic) goiter: Secondary | ICD-10-CM | POA: Insufficient documentation

## 2013-03-28 DIAGNOSIS — Z0184 Encounter for antibody response examination: Secondary | ICD-10-CM

## 2013-03-28 DIAGNOSIS — E669 Obesity, unspecified: Secondary | ICD-10-CM | POA: Insufficient documentation

## 2013-03-28 DIAGNOSIS — E049 Nontoxic goiter, unspecified: Secondary | ICD-10-CM

## 2013-03-28 LAB — HEPATIC FUNCTION PANEL
ALT: 15 U/L (ref 0–35)
AST: 20 U/L (ref 0–37)
Albumin: 3.4 g/dL — ABNORMAL LOW (ref 3.5–5.2)
Alkaline Phosphatase: 34 U/L — ABNORMAL LOW (ref 39–117)
Bilirubin, Direct: 0 mg/dL (ref 0.0–0.3)
Total Bilirubin: 0.3 mg/dL (ref 0.3–1.2)
Total Protein: 6.7 g/dL (ref 6.0–8.3)

## 2013-03-28 LAB — CBC WITH DIFFERENTIAL/PLATELET
Basophils Absolute: 0 10*3/uL (ref 0.0–0.1)
Basophils Relative: 0.9 % (ref 0.0–3.0)
Eosinophils Absolute: 0.1 10*3/uL (ref 0.0–0.7)
Eosinophils Relative: 3.4 % (ref 0.0–5.0)
HCT: 37.4 % (ref 36.0–46.0)
Hemoglobin: 12.7 g/dL (ref 12.0–15.0)
Lymphocytes Relative: 34.3 % (ref 12.0–46.0)
Lymphs Abs: 1.4 10*3/uL (ref 0.7–4.0)
MCHC: 34 g/dL (ref 30.0–36.0)
MCV: 89 fl (ref 78.0–100.0)
Monocytes Absolute: 0.4 10*3/uL (ref 0.1–1.0)
Monocytes Relative: 9.6 % (ref 3.0–12.0)
Neutro Abs: 2.2 10*3/uL (ref 1.4–7.7)
Neutrophils Relative %: 51.8 % (ref 43.0–77.0)
Platelets: 240 10*3/uL (ref 150.0–400.0)
RBC: 4.2 Mil/uL (ref 3.87–5.11)
RDW: 13.4 % (ref 11.5–14.6)
WBC: 4.2 10*3/uL — ABNORMAL LOW (ref 4.5–10.5)

## 2013-03-28 LAB — BASIC METABOLIC PANEL
BUN: 12 mg/dL (ref 6–23)
CO2: 26 mEq/L (ref 19–32)
Calcium: 8.8 mg/dL (ref 8.4–10.5)
Chloride: 102 mEq/L (ref 96–112)
Creatinine, Ser: 0.7 mg/dL (ref 0.4–1.2)
GFR: 103.83 mL/min (ref >60.00)
Glucose, Bld: 93 mg/dL (ref 70–99)
Potassium: 3.5 mEq/L (ref 3.5–5.1)
Sodium: 135 mEq/L (ref 135–145)

## 2013-03-28 LAB — LIPID PANEL
Cholesterol: 179 mg/dL (ref 0–200)
HDL: 54.4 mg/dL (ref >39.00)
LDL Cholesterol: 104 mg/dL — ABNORMAL HIGH (ref 0–99)
Total CHOL/HDL Ratio: 3
Triglycerides: 104 mg/dL (ref 0.0–149.0)
VLDL: 20.8 mg/dL (ref 0.0–40.0)

## 2013-03-28 LAB — TSH: TSH: 2.45 u[IU]/mL (ref 0.35–5.50)

## 2013-03-28 NOTE — Progress Notes (Signed)
   Subjective:    Patient ID: Lisa Miles, female    DOB: [birth year], 62 y.o.   MRN: [id number]  HPI Pre visit review using our clinic review tool, if applicable. No additional management support is needed unless otherwise documented below in the visit note.  CPE- UTD on GYN (mammo, DEXA, pap), Colonoscopy (Dr Loreta Ave), Cardiology (Dr Herbie Baltimore)  R eye ruptured vessel- noted pressure in R eye on Thursday and when she looked in the mirror, noted broken blood vessel.  Appearance worsened over the next 2 days and is now starting to improve.  No associated visual changes.   Review of Systems Patient reports no vision/ hearing changes, adenopathy,fever, weight change,  persistant/recurrent hoarseness , swallowing issues, chest pain, palpitations, edema, persistant/recurrent cough, hemoptysis, dyspnea (rest/exertional/paroxysmal nocturnal), gastrointestinal bleeding (melena, rectal bleeding), abdominal pain, significant heartburn, bowel changes, GU symptoms (dysuria, hematuria, incontinence), Gyn symptoms (abnormal  bleeding, pain),  syncope, focal weakness, memory loss, numbness & tingling, skin/hair/nail changes, abnormal bruising or bleeding, anxiety, or depression.     Objective:   Physical Exam General Appearance:    Alert, cooperative, no distress, appears stated age  Head:    Normocephalic, without obvious abnormality, atraumatic  Eyes:    PERRL, corneas clear, EOM's intact, fundi    benign, both eyes.  R subconjunctival hemorrhage  Ears:    Normal TM's and external ear canals, both ears  Nose:   Nares normal, septum midline, mucosa normal, no drainage    or sinus tenderness  Throat:   Lips, mucosa, and tongue normal; teeth and gums normal  Neck:   Supple, symmetrical, trachea midline, no adenopathy;    Thyroid: diffuse thyromegaly, R>L  Back:     Symmetric, no curvature, ROM normal, no CVA tenderness  Lungs:     Clear to auscultation bilaterally, respirations unlabored  Chest Wall:     No tenderness or deformity   Heart:    Regular rate and rhythm, S1 and S2 normal, no murmur, rub   or gallop  Breast Exam:    Deferred to GYN  Abdomen:     Soft, non-tender, bowel sounds active all four quadrants,    no masses, no organomegaly  Genitalia:    Deferred to GYN  Rectal:    Extremities:   Extremities normal, atraumatic, no cyanosis or edema  Pulses:   2+ and symmetric all extremities  Skin:   Skin color, texture, turgor normal, no rashes or lesions  Lymph nodes:   Cervical, supraclavicular, and axillary nodes normal  Neurologic:   CNII-XII intact, normal strength, sensation and reflexes    throughout          Assessment & Plan:

## 2013-03-28 NOTE — Patient Instructions (Signed)
Follow up in 6 months to recheck BP We'll notify you of your lab results and make any changes if needed Keep up the good work on healthy diet and regular exercise We'll call you with your ultrasound appt Call with any questions or concerns Happy Holidays!

## 2013-03-28 NOTE — Addendum Note (Signed)
Addended by: Jackson Latino on: 03/28/2013 09:17 AM   Modules accepted: Orders

## 2013-03-28 NOTE — Assessment & Plan Note (Signed)
New.  Encouraged regular, aerobic activity for 30 minutes at least 4x/week and monitoring caloric intake w/ the help of My[employer]itness[employer] app.  Will follow.

## 2013-03-28 NOTE — Assessment & Plan Note (Signed)
Pt's PE WNL w/ exception of thyromegaly and R subconjunctival hemorrhage.  UTD on health maintenance.  Check labs.  Anticipatory guidance provided.

## 2013-03-28 NOTE — Assessment & Plan Note (Signed)
New.  Check labs.  Get [place] to assess.

## 2013-03-29 ENCOUNTER — Encounter: Payer: Self-pay | Admitting: General Practice

## 2013-03-29 LAB — VARICELLA ZOSTER ANTIBODY, IGG: Varicella IgG: 1490 Index — ABNORMAL HIGH (ref <135.00)

## 2013-03-31 ENCOUNTER — Ambulatory Visit
Admission: RE | Admit: 2013-03-31 | Discharge: 2013-03-31 | Disposition: A | Discharge status: Home / Self Care | Payer: BC Managed Care – PPO | Source: Ambulatory Visit | Attending: Family Medicine | Admitting: Family Medicine

## 2013-03-31 DIAGNOSIS — E010 Iodine-deficiency related diffuse (endemic) goiter: Secondary | ICD-10-CM

## 2013-04-02 LAB — VITAMIN D 1,25 DIHYDROXY
Vitamin D 1, 25 (OH)2 Total: 60 pg/mL (ref 18–72)
Vitamin D2 1, 25 (OH)2: <8 pg/mL
Vitamin D3 1, 25 (OH)2: 60 pg/mL

## 2013-04-03 ENCOUNTER — Telehealth: Payer: Self-pay | Admitting: *Deleted

## 2013-04-03 NOTE — Telephone Encounter (Signed)
LM @ (5:03pm) asking the pt to RTC regarding recent US results.//AB/CMA

## 2013-04-03 NOTE — Telephone Encounter (Signed)
Message copied by Verdie Shire on Mon Apr 03, 2013  5:02 PM ------      Message from: Sheliah Hatch      Created: Fri Mar 31, 2013 11:56 AM       Pt w/ multi-nodular goiter, they recommended repeat US in 1 year ------

## 2013-04-14 NOTE — Telephone Encounter (Signed)
Spoke with the pt and she was informed of the [place] results on (04-07-13).//AB/CMA

## 2013-04-24 ENCOUNTER — Ambulatory Visit (INDEPENDENT_AMBULATORY_CARE_PROVIDER_SITE_OTHER): Payer: BC Managed Care – PPO | Admitting: Emergency Medicine

## 2013-04-24 VITALS — BP 120/74 | HR 80 | Temp 99.3°F | Resp 18 | Wt 222.0 lb

## 2013-04-24 DIAGNOSIS — K5732 Diverticulitis of large intestine without perforation or abscess without bleeding: Secondary | ICD-10-CM

## 2013-04-24 DIAGNOSIS — R1032 Left lower quadrant pain: Secondary | ICD-10-CM

## 2013-04-24 LAB — POCT CBC
Granulocyte percent: 61.9 %G (ref 37–80)
HCT, POC: 39.1 % (ref 37.7–47.9)
Hemoglobin: 12 g/dL — AB (ref 12.2–16.2)
Lymph, poc: 1.6 (ref 0.6–3.4)
MCH, POC: 29.1 pg (ref 27–31.2)
MCHC: 30.7 g/dL — AB (ref 31.8–35.4)
MCV: 94.9 fL (ref 80–97)
MID (cbc): 0.4 (ref 0–0.9)
MPV: 7.9 fL (ref 0–99.8)
POC Granulocyte: 3.2 (ref 2–6.9)
POC LYMPH PERCENT: 30.8 %L (ref 10–50)
POC MID %: 7.3 %M (ref 0–12)
Platelet Count, POC: 218 10*3/uL (ref 142–424)
RBC: 4.12 M/uL (ref 4.04–5.48)
RDW, POC: 12.8 %
WBC: 5.2 10*3/uL (ref 4.6–10.2)

## 2013-04-24 MED ORDER — AMOXICILLIN-POT CLAVULANATE ER 1000-62.5 MG PO TB12: 2.0000 | ORAL_TABLET | Freq: Two times a day (BID) | ORAL | Status: DC

## 2013-04-24 NOTE — Progress Notes (Signed)
[facility] [address] [place] [address] [phone]  Date:  04/24/2013   Name:  Lisa Miles   DOB:  24-Jan-1951   MRN:  [id number]  PCP:  Neena Rhymes, MD    Chief Complaint: Cough and Nasal Congestion   History of Present Illness:  Lisa Miles is a 62 y.o. very pleasant female patient who presents with the following:  Long history of LLQ groin pain that waxes and wanes.  This bout began Saturday and has progressed.  Had CT abdomen and pelvis in March and an MRI in September, both were negative for hernia and she has been treated with pain medication.  She has had a cough and nasal congestion before Christmas.  Went to [facility] on Saturday and was put on albuterol, flonase, and omnicef with some improvement.  Cough is still present and has a mucopurulent sputum that has not changed with treatment.  Says with the cough, she is experiencing LLQ pain more acutely.  She denies any swelling or palpable mass in the inguinal area.  No fever or chills, no wheezing or shortness of breath.  No nausea or vomiting.  No rash or stool change.  No improvement with over the counter medications or other home remedies. Denies other complaint or health concern today.   Patient Active Problem List   Diagnosis Date Noted  . Thyromegaly 03/28/2013  . Routine general medical examination at a health care facility 03/28/2013  . Obesity (BMI 30-39.9) 03/28/2013  . Lower extremity edema   . Inguinodynia 12/23/2012  . HTN (hypertension) 07/14/2012  . Bilateral groin pain 07/14/2012  . HYPERLIPIDEMIA 05/30/2009  . Mitral regurgitation due to cusp prolapse - moderate MR with mild prolapse 05/30/2009  . AORTIC VALVE DISORDERS - mild stenosis with moderate regurgitation 05/30/2009  . ASTHMA UNSPECIFIED WITH EXACERBATION 04/02/2009  . HEART MURMUR, SYSTOLIC 04/02/2009    Past Medical History  Diagnosis Date  . Heart disease   . Aortic stenosis, mild 07/2017    TEE  --  mild to moderate aortic stenosis which is a little more significant than the mild stenosis & moderate regurgitation which is stable. MILD mitral valve prolapse w/moderate regurgitation but no stenosis. Mild elevated pulmonary pressures were noted w/moderate tricuspid regurgitation. EF = 55-60% with grade 1 diastolic dysfunction.  . Mitral regurgitation due to cusp prolapse 07/2017    TEE - mild to moderate aortic stenosis which is a little more significant than the mild stenosis & moderate regurgitation which is stable. MILD mitral valve prolapse w/moderate regurgitation but no stenosis. Mild elevated pulmonary pressures were noted w/moderate tricuspid regurgitation. EF = 55-60% with grade 1 diastolic dysfunction.  . Hyperlipidemia   . Lower extremity edema     Chronic. Venous Duplex 11/06/11 SUMMARY: 1) Bilateral Lower Extremities: No evidence of DVT or thrombophlebitis.  2) Right Common Femoral Vein: Demonstrated mild valvular insufficiency with a greater than (1) sec of duration. Mildly abnormal LE Venous duplex Doppler.  . Hypertension     Good control  . Palpitations     Relatively well controlled  . Obesity   . Bronchitis, chronic   . Asthma   . Inguinodynia     Bilateral groin pain  . Bilateral bunions     Bunionectomies performed  . Scoliosis     DG Chest 2 View x-ray on 07/30/11 by Dr. Cleta Alberts shows a scoliosis.    Past Surgical History  Procedure Laterality Date  . Bunionectomy Bilateral   .  Knee arthroscopy Right   . Abdominal hysterectomy    . Ankle arthroscopy Left   . Left knee open surgery    . Total abdominal hysterectomy      History  Substance Use Topics  . Smoking status: Never Smoker   . Smokeless tobacco: Never Used  . Alcohol Use: Yes     Comment: One bottle of wine at Christmas    Family History  Problem Relation Age of Onset  . Arthritis Mother   . Hyperlipidemia Mother   . Diabetes Mother   . Gout Mother   . Hypertension Mother   . Cancer Father     GI  cancer  . Diabetes Maternal Grandfather   . Hypertension Brother   . Diabetes Brother   . Hypertension Brother     No Known Allergies  Medication list has been reviewed and updated.  Current Outpatient Prescriptions on File Prior to Visit  Medication Sig Dispense Refill  . aspirin EC 81 MG tablet Take 81 mg by mouth daily.      . calcium carbonate (OS-CAL) 600 MG TABS Take 600 mg by mouth 2 (two) times daily with a meal.      . Cholecalciferol (VITAMIN D-3) 1000 UNITS CAPS Take 1 capsule by mouth daily.      [name] Kitchen estradiol (ESTRACE) 2 MG tablet Take 2 mg by mouth daily.      . hydrochlorothiazide (HYDRODIURIL) 25 MG tablet Take 25 mg by mouth daily.      [name] Kitchen lisinopril (PRINIVIL,ZESTRIL) 20 MG tablet Take 20 mg by mouth daily.      . methocarbamol (ROBAXIN) 500 MG tablet TAKE 1 TABLET BY MOUTH FOUR TIMES DAILY  30 tablet  0   No current facility-administered medications on file prior to visit.    Review of Systems:  As per HPI, otherwise negative.    Physical Examination: Filed Vitals:   04/24/13 1332  BP: 120/74  Pulse: 80  Temp: 99.3 F (37.4 C)  Resp: 18   Filed Vitals:   04/24/13 1332  Weight: 222 lb (100.699 kg)   Body mass index is 36.11 kg/(m^2). Ideal Body Weight:    GEN: WDWN, NAD, Non-toxic, A & O x 3 HEENT: Atraumatic, Normocephalic. Neck supple. No masses, No LAD. Ears and Nose: No external deformity. CV: RRR, No M/G/R. No JVD. No thrill. No extra heart sounds. PULM: CTA B, no wheezes, crackles, rhonchi. No retractions. No resp. distress. No accessory muscle use. ABD: S,, ND, +BS. No rebound. No HSM.  Left lower quadrant tenderness and guarding EXTR: No c/c/e NEURO Normal gait.  PSYCH: Normally interactive. Conversant. Not depressed or anxious appearing.  Calm demeanor.    Assessment and Plan: Diverticulitis augmentin XR   Signed,  Phillips Odor, MD   Results for orders placed in visit on 04/24/13  POCT CBC      Result Value Range   WBC  5.2  4.6 - 10.2 K/uL   Lymph, poc 1.6  0.6 - 3.4   POC LYMPH PERCENT 30.8  10 - 50 %L   MID (cbc) 0.4  0 - 0.9   POC MID % 7.3  0 - 12 %M   POC Granulocyte 3.2  2 - 6.9   Granulocyte percent 61.9  37 - 80 %G   RBC 4.12  4.04 - 5.48 M/uL   Hemoglobin 12.0 (*) 12.2 - 16.2 g/dL   HCT, POC 16.1  09.6 - 47.9 %   MCV 94.9  80 - 97 fL  MCH, POC 29.1  27 - 31.2 pg   MCHC 30.7 (*) 31.8 - 35.4 g/dL   RDW, POC 13.2     Platelet Count, POC 218  142 - 424 K/uL   MPV 7.9  0 - 99.8 fL

## 2013-04-24 NOTE — Patient Instructions (Signed)
Diverticulitis °A diverticulum is a small pouch or sac on the colon. Diverticulosis is the presence of these diverticula on the colon. Diverticulitis is the irritation (inflammation) or infection of diverticula. °CAUSES  °The colon and its diverticula contain bacteria. If food particles block the tiny opening to a diverticulum, the bacteria inside can grow and cause an increase in pressure. This leads to infection and inflammation and is called diverticulitis. °SYMPTOMS  °· Abdominal pain and tenderness. Usually, the pain is located on the left side of your abdomen. However, it could be located elsewhere. °· Fever. °· Bloating. °· Feeling sick to your stomach (nausea). °· Throwing up (vomiting). °· Abnormal stools. °DIAGNOSIS  °Your caregiver will take a history and perform a physical exam. Since many things can cause abdominal pain, other tests may be necessary. Tests may include: °· Blood tests. °· Urine tests. °· X-ray of the abdomen. °· CT scan of the abdomen. °Sometimes, surgery is needed to determine if diverticulitis or other conditions are causing your symptoms. °TREATMENT  °Most of the time, you can be treated without surgery. Treatment includes: °· Resting the bowels by only having liquids for a few days. As you improve, you will need to eat a low-fiber diet. °· Intravenous (IV) fluids if you are losing body fluids (dehydrated). °· Antibiotic medicines that treat infections may be given. °· Pain and nausea medicine, if needed. °· Surgery if the inflamed diverticulum has burst. °HOME CARE INSTRUCTIONS  °· Try a clear liquid diet (broth, tea, or water for as long as directed by your caregiver). You may then gradually begin a low-fiber diet as tolerated.  °A low-fiber diet is a diet with less than 10 grams of fiber. Choose the foods below to reduce fiber in the diet: °· White breads, cereals, rice, and pasta. °· Cooked fruits and vegetables or soft fresh fruits and vegetables without the skin. °· Ground or  well-cooked tender beef, ham, veal, lamb, pork, or poultry. °· Eggs and seafood. °· After your diverticulitis symptoms have improved, your caregiver may put you on a high-fiber diet. A high-fiber diet includes 14 grams of fiber for every 1000 calories consumed. For a standard 2000 calorie diet, you would need 28 grams of fiber. Follow these diet guidelines to help you increase the fiber in your diet. It is important to slowly increase the amount fiber in your diet to avoid gas, constipation, and bloating. °· Choose whole-grain breads, cereals, pasta, and brown rice. °· Choose fresh fruits and vegetables with the skin on. Do not overcook vegetables because the more vegetables are cooked, the more fiber is lost. °· Choose more nuts, seeds, legumes, dried peas, beans, and lentils. °· Look for food products that have greater than 3 grams of fiber per serving on the Nutrition Facts label. °· Take all medicine as directed by your caregiver. °· If your caregiver has given you a follow-up appointment, it is very important that you go. Not going could result in lasting (chronic) or permanent injury, pain, and disability. If there is any problem keeping the appointment, call to reschedule. °SEEK MEDICAL CARE IF:  °· Your pain does not improve. °· You have a hard time advancing your diet beyond clear liquids. °· Your bowel movements do not return to normal. °SEEK IMMEDIATE MEDICAL CARE IF:  °· Your pain becomes worse. °· You have an oral temperature above 102° F (38.9° C), not controlled by medicine. °· You have repeated vomiting. °· You have bloody or black, tarry stools. °·   Symptoms that brought you to your caregiver become worse or are not getting better. °MAKE SURE YOU:  °· Understand these instructions. °· Will watch your condition. °· Will get help right away if you are not doing well or get worse. °Document Released: 01/21/2005 Document Revised: 07/06/2011 Document Reviewed: 05/19/2010 °[employer]® Patient Information  ©2014 [employer]. ° °

## 2013-05-22 ENCOUNTER — Ambulatory Visit (INDEPENDENT_AMBULATORY_CARE_PROVIDER_SITE_OTHER): Payer: BC Managed Care – PPO | Admitting: Family Medicine

## 2013-05-22 ENCOUNTER — Ambulatory Visit: Payer: BC Managed Care – PPO

## 2013-05-22 VITALS — BP 120/76 | HR 79 | Temp 98.9°F | Resp 17 | Ht 65.0 in | Wt 215.0 lb

## 2013-05-22 DIAGNOSIS — R05 Cough: Secondary | ICD-10-CM

## 2013-05-22 DIAGNOSIS — R059 Cough, unspecified: Secondary | ICD-10-CM

## 2013-05-22 DIAGNOSIS — R0982 Postnasal drip: Secondary | ICD-10-CM

## 2013-05-22 MED ORDER — HYDROCODONE-HOMATROPINE 5-1.5 MG/5ML PO SYRP: 5.0000 mL | ORAL_SOLUTION | Freq: Three times a day (TID) | ORAL | Status: DC | PRN

## 2013-05-22 MED ORDER — BENZONATATE 100 MG PO CAPS: 100.0000 mg | ORAL_CAPSULE | Freq: Three times a day (TID) | ORAL | Status: DC | PRN

## 2013-05-22 MED ORDER — IPRATROPIUM BROMIDE 0.03 % NA SOLN: 2.0000 | Freq: Four times a day (QID) | NASAL | Status: DC

## 2013-05-22 NOTE — Progress Notes (Signed)
[facility] [address], [place] [address] [phone]  Date:  05/22/2013   Name:  Lisa Miles   DOB:  07/30/1950   MRN:  [id number]  PCP:  Annye Asa, MD    Chief Complaint: Bronchitis, Cough, URI and Nasal Congestion   History of Present Illness:  Lisa Miles is a 63 y.o. very pleasant female patient who presents with the following:  She is here today with possible bronchitis.  She was seen at another UC on 04/22/14; was diagnosed with bronchitis and was treated with pro-air, fluticasone NS and omnicef.  She was then seen here on 12/29 and was treated with augmentin for likely diverticulitis.  She does not recall if she stopped taking the omnicef then or if she took both abx concurrently.   She did get better, but then her sx seemed to return over the last 4 days. Her main sx is cough- this seems triggered by PND.  She has noted a HA from the cough. This is not enough to really bother her but she is coughing so much that she will urinate on herself a few times a day. This is really bothersome as she has to change her clothes.  She feels weak and tired from cough and HA.  She notes wheezing, has not noted a fever.  She also does note some nasal congestion and is taking flonase for this  The cough is sometimes productive.    She does work with paper and dust, but has never been a smoker/   She has not noted a fever, no GI symptoms.    She does have a history of valvular heart disease which is followed by her doctor at [facility], most recent visit in 12/2012. Her sx have been stable and disease progression slow.  She will have an echo at some point this spring.   Patient Active Problem List   Diagnosis Date Noted  . Thyromegaly 03/28/2013  . Routine general medical examination at a health care facility 03/28/2013  . Obesity (BMI 30-39.9) 03/28/2013  . Lower extremity edema   . Inguinodynia 12/23/2012  . HTN (hypertension) 07/14/2012  . Bilateral  groin pain 07/14/2012  . HYPERLIPIDEMIA 05/30/2009  . Mitral regurgitation due to cusp prolapse - moderate MR with mild prolapse 05/30/2009  . AORTIC VALVE DISORDERS - mild stenosis with moderate regurgitation 05/30/2009  . ASTHMA UNSPECIFIED WITH EXACERBATION 04/02/2009  . HEART MURMUR, SYSTOLIC 73/22/0254    Past Medical History  Diagnosis Date  . Heart disease   . Aortic stenosis, mild 07/2017    TEE  -- mild to moderate aortic stenosis which is a little more significant than the mild stenosis & moderate regurgitation which is stable. MILD mitral valve prolapse w/moderate regurgitation but no stenosis. Mild elevated pulmonary pressures were noted w/moderate tricuspid regurgitation. EF = 55-60% with grade 1 diastolic dysfunction.  . Mitral regurgitation due to cusp prolapse 07/2017    TEE - mild to moderate aortic stenosis which is a little more significant than the mild stenosis & moderate regurgitation which is stable. MILD mitral valve prolapse w/moderate regurgitation but no stenosis. Mild elevated pulmonary pressures were noted w/moderate tricuspid regurgitation. EF = 55-60% with grade 1 diastolic dysfunction.  . Hyperlipidemia   . Lower extremity edema     Chronic. Venous Duplex 11/06/11 SUMMARY: 1) Bilateral Lower Extremities: No evidence of DVT or thrombophlebitis.  2) Right Common Femoral Vein: Demonstrated mild valvular insufficiency with a greater than (1)  sec of duration. Mildly abnormal LE Venous duplex Doppler.  . Hypertension     Good control  . Palpitations     Relatively well controlled  . Obesity   . Bronchitis, chronic   . Asthma   . Inguinodynia     Bilateral groin pain  . Bilateral bunions     Bunionectomies performed  . Scoliosis     DG Chest 2 View x-ray on 07/30/11 by Dr. Everlene Farrier shows a scoliosis.    Past Surgical History  Procedure Laterality Date  . Bunionectomy Bilateral   . Knee arthroscopy Right   . Abdominal hysterectomy    . Ankle arthroscopy Left   .  Left knee open surgery    . Total abdominal hysterectomy      History  Substance Use Topics  . Smoking status: Never Smoker   . Smokeless tobacco: Never Used  . Alcohol Use: Yes     Comment: One bottle of wine at Christmas    Family History  Problem Relation Age of Onset  . Arthritis Mother   . Hyperlipidemia Mother   . Diabetes Mother   . Gout Mother   . Hypertension Mother   . Cancer Father     GI cancer  . Diabetes Maternal Grandfather   . Hypertension Brother   . Diabetes Brother   . Hypertension Brother     No Known Allergies  Medication list has been reviewed and updated.  Current Outpatient Prescriptions on File Prior to Visit  Medication Sig Dispense Refill  . albuterol (PROVENTIL HFA;VENTOLIN HFA) 108 (90 BASE) MCG/ACT inhaler Inhale 2 puffs into the lungs every 6 (six) hours as needed for wheezing or shortness of breath.      [name] Kitchen amoxicillin-clavulanate (AUGMENTIN XR) 1000-62.5 MG per tablet Take 2 tablets by mouth 2 (two) times daily.  40 tablet  0  . aspirin EC 81 MG tablet Take 81 mg by mouth daily.      . calcium carbonate (OS-CAL) 600 MG TABS Take 600 mg by mouth 2 (two) times daily with a meal.      . Cholecalciferol (VITAMIN D-3) 1000 UNITS CAPS Take 1 capsule by mouth daily.      [name] Kitchen estradiol (ESTRACE) 2 MG tablet Take 2 mg by mouth daily.      . fluticasone (VERAMYST) 27.5 MCG/SPRAY nasal spray Place 2 sprays into the nose daily.      . hydrochlorothiazide (HYDRODIURIL) 25 MG tablet Take 25 mg by mouth daily.      [name] Kitchen lisinopril (PRINIVIL,ZESTRIL) 20 MG tablet Take 20 mg by mouth daily.      . methocarbamol (ROBAXIN) 500 MG tablet TAKE 1 TABLET BY MOUTH FOUR TIMES DAILY  30 tablet  0   No current facility-administered medications on file prior to visit.    Review of Systems:  As per HPI- otherwise negative.   Physical Examination: Filed Vitals:   05/22/13 1630  BP: 120/76  Pulse: 79  Temp: 98.9 F (37.2 C)  Resp: 17   Filed Vitals:    05/22/13 1630  Height: 5\' 5"  (1.651 m)  Weight: 215 lb (97.523 kg)   Body mass index is 35.78 kg/(m^2). Ideal Body Weight: Weight in (lb) to have BMI = 25: 149.9  GEN: WDWN, NAD, Non-toxic, A & O x 3, overweight, looks well HEENT: Atraumatic, Normocephalic. Neck supple. No masses, No LAD.  Bilateral TM wnl, oropharynx normal.  PEERL,EOMI.   Ears and Nose: No external deformity. CV: RRR, soft murmur and loud  S1/S2. No JVD. No thrill. No extra heart sounds. PULM: CTA B, no wheezes, crackles, rhonchi. No retractions. No resp. distress. No accessory muscle use. ABD: S, NT, ND, +BS. No rebound. No HSM. EXTR: No c/c/e NEURO Normal gait.  PSYCH: Normally interactive. Conversant. Not depressed or anxious appearing.  Calm demeanor.   [facility] reading (PRIMARY) by  Dr. Lorelei Pont. CXR: negative  Assessment and Plan: Cough - Plan: DG Chest 2 View, benzonatate (TESSALON) 100 MG capsule, HYDROcodone-homatropine (HYCODAN) 5-1.5 MG/5ML syrup  PND (post-nasal drip) - Plan: ipratropium (ATROVENT) 0.03 % nasal spray  Cheryllynn is here today with recurrent cough.  She does have a history of asthma and uses an albuterol inhaler as needed.  Right now I do not see evidence of a bacterial infection.  Explained that especially as she recently took 2 strong antibiotics we would prefer to avoid this if possible Treat her cough with tessalon perles and hycodan syrup- cautioned regarding sedation. Do not take with robaxin atrovent NS will likely help her PND.    Close follow-up if not better soon; Sooner if worse.     Signed Lamar Blinks, MD

## 2013-05-22 NOTE — Patient Instructions (Signed)
Use the tessalon perles as needed for cough- you can also use the hycodan but be careful of sedation with this medication.    Use the atrovent nasal spray as needed for drainage.    Let me know if you ae not feeling better in the next few days- Sooner if worse.

## 2013-05-29 ENCOUNTER — Ambulatory Visit (INDEPENDENT_AMBULATORY_CARE_PROVIDER_SITE_OTHER): Payer: BC Managed Care – PPO | Admitting: Emergency Medicine

## 2013-05-29 VITALS — BP 110/80 | HR 64 | Temp 98.4°F | Resp 16 | Ht 64.0 in | Wt 218.0 lb

## 2013-05-29 DIAGNOSIS — J209 Acute bronchitis, unspecified: Secondary | ICD-10-CM

## 2013-05-29 DIAGNOSIS — K644 Residual hemorrhoidal skin tags: Secondary | ICD-10-CM

## 2013-05-29 DIAGNOSIS — J069 Acute upper respiratory infection, unspecified: Secondary | ICD-10-CM

## 2013-05-29 MED ORDER — PSEUDOEPHEDRINE-GUAIFENESIN ER 60-600 MG PO TB12: 1.0000 | ORAL_TABLET | Freq: Two times a day (BID) | ORAL | Status: DC

## 2013-05-29 MED ORDER — HYDROCOD POLST-CHLORPHEN POLST 10-8 MG/5ML PO LQCR: 5.0000 mL | Freq: Two times a day (BID) | ORAL | Status: DC | PRN

## 2013-05-29 MED ORDER — ALBUTEROL SULFATE HFA 108 (90 BASE) MCG/ACT IN AERS: 2.0000 | INHALATION_SPRAY | RESPIRATORY_TRACT | Status: DC | PRN

## 2013-05-29 NOTE — Patient Instructions (Signed)

## 2013-05-29 NOTE — Progress Notes (Signed)
[facility] [address], [place] [address] [phone]  Date:  05/29/2013   Name:  Lisa Miles   DOB:  October 02, 1950   MRN:  [id number]  PCP:  Annye Asa, MD    Chief Complaint: Follow-up   History of Present Illness:  Lisa Miles is a 63 y.o. very pleasant female patient who presents with the following:  Saw Dr Lorelei Pont for a bronchitis on 1/26 and another Dr in another clinic the next day.  Chest xray done was negative.  She claims a cough that started over two weeks ago and she "does not believe the cough is related to a regular cold".  Has persistent nasal congestion and post nasal drainage that is mucoid.  Has occasionally productive mucoid cough with moderate wheezing but no shortness of breath.  No nausea or vomiting.  No stool change or rash.  No fever or chills.  Taking meds prescribed by Dr Lorelei Pont.  Says she has been on two consequtive rounds of antibiotics.  No improvement with over the counter medications or other home remedies. Denies other complaint or health concern today.   Patient Active Problem List   Diagnosis Date Noted  . Thyromegaly 03/28/2013  . Routine general medical examination at a health care facility 03/28/2013  . Obesity (BMI 30-39.9) 03/28/2013  . Lower extremity edema   . Inguinodynia 12/23/2012  . HTN (hypertension) 07/14/2012  . Bilateral groin pain 07/14/2012  . HYPERLIPIDEMIA 05/30/2009  . Mitral regurgitation due to cusp prolapse - moderate MR with mild prolapse 05/30/2009  . AORTIC VALVE DISORDERS - mild stenosis with moderate regurgitation 05/30/2009  . ASTHMA UNSPECIFIED WITH EXACERBATION 04/02/2009  . HEART MURMUR, SYSTOLIC 76/19/5093    Past Medical History  Diagnosis Date  . Heart disease   . Aortic stenosis, mild 07/2017    TEE  -- mild to moderate aortic stenosis which is a little more significant than the mild stenosis & moderate regurgitation which is stable. MILD mitral valve prolapse w/moderate  regurgitation but no stenosis. Mild elevated pulmonary pressures were noted w/moderate tricuspid regurgitation. EF = 55-60% with grade 1 diastolic dysfunction.  . Mitral regurgitation due to cusp prolapse 07/2017    TEE - mild to moderate aortic stenosis which is a little more significant than the mild stenosis & moderate regurgitation which is stable. MILD mitral valve prolapse w/moderate regurgitation but no stenosis. Mild elevated pulmonary pressures were noted w/moderate tricuspid regurgitation. EF = 55-60% with grade 1 diastolic dysfunction.  . Hyperlipidemia   . Lower extremity edema     Chronic. Venous Duplex 11/06/11 SUMMARY: 1) Bilateral Lower Extremities: No evidence of DVT or thrombophlebitis.  2) Right Common Femoral Vein: Demonstrated mild valvular insufficiency with a greater than (1) sec of duration. Mildly abnormal LE Venous duplex Doppler.  . Hypertension     Good control  . Palpitations     Relatively well controlled  . Obesity   . Bronchitis, chronic   . Asthma   . Inguinodynia     Bilateral groin pain  . Bilateral bunions     Bunionectomies performed  . Scoliosis     DG Chest 2 View x-ray on 07/30/11 by Dr. Everlene Farrier shows a scoliosis.    Past Surgical History  Procedure Laterality Date  . Bunionectomy Bilateral   . Knee arthroscopy Right   . Abdominal hysterectomy    . Ankle arthroscopy Left   . Left knee open surgery    . Total abdominal hysterectomy  History  Substance Use Topics  . Smoking status: Never Smoker   . Smokeless tobacco: Never Used  . Alcohol Use: Yes     Comment: One bottle of wine at Christmas    Family History  Problem Relation Age of Onset  . Arthritis Mother   . Hyperlipidemia Mother   . Diabetes Mother   . Gout Mother   . Hypertension Mother   . Cancer Father     GI cancer  . Diabetes Maternal Grandfather   . Hypertension Brother   . Diabetes Brother   . Hypertension Brother     No Known Allergies  Medication list has been  reviewed and updated.  Current Outpatient Prescriptions on File Prior to Visit  Medication Sig Dispense Refill  . albuterol (PROVENTIL HFA;VENTOLIN HFA) 108 (90 BASE) MCG/ACT inhaler Inhale 2 puffs into the lungs every 6 (six) hours as needed for wheezing or shortness of breath.      [name] Kitchen aspirin EC 81 MG tablet Take 81 mg by mouth daily.      . benzonatate (TESSALON) 100 MG capsule Take 1 capsule (100 mg total) by mouth 3 (three) times daily as needed for cough.  40 capsule  0  . calcium carbonate (OS-CAL) 600 MG TABS Take 600 mg by mouth 2 (two) times daily with a meal.      . Cholecalciferol (VITAMIN D-3) 1000 UNITS CAPS Take 1 capsule by mouth daily.      [name] Kitchen estradiol (ESTRACE) 2 MG tablet Take 2 mg by mouth daily.      . hydrochlorothiazide (HYDRODIURIL) 25 MG tablet Take 25 mg by mouth daily.      [name] Kitchen HYDROcodone-homatropine (HYCODAN) 5-1.5 MG/5ML syrup Take 5 mLs by mouth every 8 (eight) hours as needed for cough.  90 mL  0  . ipratropium (ATROVENT) 0.03 % nasal spray Place 2 sprays into the nose 4 (four) times daily.  30 mL  6  . lisinopril (PRINIVIL,ZESTRIL) 20 MG tablet Take 20 mg by mouth daily.      . methocarbamol (ROBAXIN) 500 MG tablet TAKE 1 TABLET BY MOUTH FOUR TIMES DAILY  30 tablet  0  . fluticasone (VERAMYST) 27.5 MCG/SPRAY nasal spray Place 2 sprays into the nose daily.       No current facility-administered medications on file prior to visit.    Review of Systems:  As per HPI, otherwise negative.    Physical Examination: Filed Vitals:   05/29/13 1119  BP: 110/80  Pulse: 64  Temp: 98.4 F (36.9 C)  Resp: 16   Filed Vitals:   05/29/13 1119  Height: 5\' 4"  (1.626 m)  Weight: 218 lb (98.884 kg)   Body mass index is 37.4 kg/(m^2). Ideal Body Weight: Weight in (lb) to have BMI = 25: 145.3  GEN: obese, NAD, Non-toxic, A & O x 3 HEENT: Atraumatic, Normocephalic. Neck supple. No masses, No LAD. Ears and Nose: No external deformity. CV: RRR, No M/G/R. No JVD. No  thrill. No extra heart sounds. PULM: CTA B, scattered basilar wheezes, no crackles, rhonchi. No retractions. No resp. distress. No accessory muscle use. ABD: S, NT, ND, +BS. No rebound. No HSM. EXTR: No c/c/e NEURO Normal gait.  PSYCH: Normally interactive. Conversant. Not depressed or anxious appearing.  Calm demeanor.    Assessment and Plan: URI with moderate nasal congestion and drainage Bronchitis with bronchospasm Continue meds Albuterol MDI  mucinex d  Signed,  Ellison Carwin, MD

## 2013-08-06 ENCOUNTER — Other Ambulatory Visit (HOSPITAL_COMMUNITY): Payer: Self-pay | Admitting: Cardiology

## 2013-08-07 NOTE — Telephone Encounter (Signed)
Rx was sent to pharmacy electronically. 

## 2013-10-07 ENCOUNTER — Ambulatory Visit (INDEPENDENT_AMBULATORY_CARE_PROVIDER_SITE_OTHER): Payer: BC Managed Care – PPO

## 2013-10-07 ENCOUNTER — Ambulatory Visit (INDEPENDENT_AMBULATORY_CARE_PROVIDER_SITE_OTHER): Payer: BC Managed Care – PPO | Admitting: Emergency Medicine

## 2013-10-07 VITALS — BP 114/76 | HR 71 | Temp 98.9°F | Resp 18 | Ht 65.0 in | Wt 218.8 lb

## 2013-10-07 DIAGNOSIS — J209 Acute bronchitis, unspecified: Secondary | ICD-10-CM

## 2013-10-07 MED ORDER — ALBUTEROL SULFATE (2.5 MG/3ML) 0.083% IN NEBU
2.5000 mg | INHALATION_SOLUTION | Freq: Once | RESPIRATORY_TRACT | Status: AC
  Administered 2013-10-07: 2.5 mg via RESPIRATORY_TRACT

## 2013-10-07 MED ORDER — IPRATROPIUM BROMIDE 0.02 % IN SOLN
0.5000 mg | Freq: Once | RESPIRATORY_TRACT | Status: AC
  Administered 2013-10-07: 0.5 mg via RESPIRATORY_TRACT

## 2013-10-07 MED ORDER — PROMETHAZINE-CODEINE 6.25-10 MG/5ML PO SYRP: 5.0000 mL | ORAL_SOLUTION | Freq: Four times a day (QID) | ORAL | Status: DC | PRN

## 2013-10-07 MED ORDER — ALBUTEROL SULFATE (2.5 MG/3ML) 0.083% IN NEBU
5.0000 mg | INHALATION_SOLUTION | Freq: Once | RESPIRATORY_TRACT | Status: AC
  Administered 2013-10-07: 5 mg via RESPIRATORY_TRACT

## 2013-10-07 MED ORDER — SPACER/AERO-HOLDING CHAMBERS DEVI: Status: DC

## 2013-10-07 MED ORDER — AZITHROMYCIN 250 MG PO TABS: ORAL_TABLET | ORAL | Status: DC

## 2013-10-07 NOTE — Patient Instructions (Signed)
Metered Dose Inhaler (No Spacer Used) Inhaled medicines are the basis of asthma treatment and other breathing problems. Inhaled medicine can only be effective if used properly. Good technique assures that the medicine reaches the lungs. Metered dose inhalers (MDIs) are used to deliver a variety of inhaled medicines. These include quick relief or rescue medicines (such as bronchodilators) and controller medicines (such as corticosteroids). The medicine is delivered by pushing down on a metal canister to release a set amount of spray. If you are using different kinds of inhalers, use your quick relief medicine to open the airways 10 15 minutes before using a steroid if instructed to do so by your health care provider. If you are unsure which inhalers to use and the order of using them, ask your health care provider, nurse, or respiratory therapist. HOW TO USE THE INHALER 1. Remove cap from inhaler. 2. If you are using the inhaler for the first time, you will need to prime it. Shake the inhaler for 5 seconds and release four puffs into the air, away from your face. Ask your health care provider or pharmacist if you have questions about priming your inhaler. 3. Shake inhaler for 5 seconds before each breath in (inhalation). 4. Position the inhaler so that the top of the canister faces up. 5. Put your index finger on the top of the medicine canister. Your thumb supports the bottom of the inhaler. 6. Open your mouth. 7. Either place the inhaler between your teeth and place your lips tightly around the mouthpiece, or hold the inhaler 1 2 inches away from your open mouth. If you are unsure of which technique to use, ask your health care provider. 8. Breathe out (exhale) normally and as completely as possible. 9. Press the canister down with the index finger to release the medicine. 10. At the same time as the canister is pressed, inhale deeply and slowly until the lungs are completely filled. This should take  4 6 seconds. Keep your tongue down. 11. Hold the medicine in your lungs for up to 5 10 seconds (10 seconds is best). This helps the medicine get into the small airways of your lungs. 12. Breathe out slowly, through pursed lips. Whistling is an example of pursed lips. 13. Wait at least 1 minute between puffs. Continue with the above steps until you have taken the number of puffs your health care provider has ordered. Do not use the inhaler more than your health care provider directs you to. 14. Replace cap on inhaler. 15. Follow the directions from your health care provider or the inhaler insert for cleaning the inhaler. If you are using a steroid inhaler, rinse your mouth with water after your last puff, gargle, and spit out the water. Do not swallow the water. AVOID:  Inhaling before or after starting the spray of medicine. It takes practice to coordinate your breathing with triggering the spray.  Inhaling through the nose (rather than the mouth) when triggering the spray. HOW TO DETERMINE IF YOUR INHALER IS FULL OR NEARLY EMPTY You cannot know when an inhaler is empty by shaking it. A few inhalers are now being made with dose counters. Ask your health care provider for a prescription that has a dose counter if you feel you need that extra help. If your inhaler does not have a counter, ask your health care provider to help you determine the date you need to refill your inhaler. Write the refill date on a calendar or your inhaler canister.  Refill your inhaler 7 10 days before it runs out. Be sure to keep an adequate supply of medicine. This includes making sure it is not expired, and you have a spare inhaler.  SEEK MEDICAL CARE IF:   Symptoms are only partially relieved with your inhaler.  You are having trouble using your inhaler.  You experience some increase in phlegm. SEEK IMMEDIATE MEDICAL CARE IF:   You feel little or no relief with your inhalers. You are still wheezing and are feeling  shortness of breath or tightness in your chest or both.  You have dizziness, headaches, or fast heart rate.  You have chills, fever, or night sweats.  There is a noticeable increase in phlegm production, or there is blood in the phlegm. Document Released: 02/08/2007 Document Revised: 12/14/2012 Document Reviewed: 09/29/2012 [facility] Patient Information 2014 [place], [place].

## 2013-10-07 NOTE — Progress Notes (Signed)
[facility] [address] [place] [address] [phone]  Date:  10/07/2013   Name:  Lisa Miles   DOB:  Jun 22, 1950   MRN:  [id number]  PCP:  Annye Asa, MD    Chief Complaint: Cough   History of Present Illness:  Lisa Miles is a 63 y.o. very pleasant female patient who presents with the following:  2 1/2 week history of nasal congestion and post nasal drip.  Has a sore throat.  Cough productive of mucopurulent sputum.  Wheezing. No shortness of breath.  No nausea or vomiting.  No improvement with over the counter medications or other home remedies. Denies other complaint or health concern today.   Patient Active Problem List   Diagnosis Date Noted  . Thyromegaly 03/28/2013  . Routine general medical examination at a health care facility 03/28/2013  . Obesity (BMI 30-39.9) 03/28/2013  . Lower extremity edema   . Inguinodynia 12/23/2012  . HTN (hypertension) 07/14/2012  . Bilateral groin pain 07/14/2012  . HYPERLIPIDEMIA 05/30/2009  . Mitral regurgitation due to cusp prolapse - moderate MR with mild prolapse 05/30/2009  . AORTIC VALVE DISORDERS - mild stenosis with moderate regurgitation 05/30/2009  . ASTHMA UNSPECIFIED WITH EXACERBATION 04/02/2009  . HEART MURMUR, SYSTOLIC 19/14/7829    Past Medical History  Diagnosis Date  . Heart disease   . Aortic stenosis, mild 07/2017    TEE  -- mild to moderate aortic stenosis which is a little more significant than the mild stenosis & moderate regurgitation which is stable. MILD mitral valve prolapse w/moderate regurgitation but no stenosis. Mild elevated pulmonary pressures were noted w/moderate tricuspid regurgitation. EF = 55-60% with grade 1 diastolic dysfunction.  . Mitral regurgitation due to cusp prolapse 07/2017    TEE - mild to moderate aortic stenosis which is a little more significant than the mild stenosis & moderate regurgitation which is stable. MILD mitral valve prolapse w/moderate  regurgitation but no stenosis. Mild elevated pulmonary pressures were noted w/moderate tricuspid regurgitation. EF = 55-60% with grade 1 diastolic dysfunction.  . Hyperlipidemia   . Lower extremity edema     Chronic. Venous Duplex 11/06/11 SUMMARY: 1) Bilateral Lower Extremities: No evidence of DVT or thrombophlebitis.  2) Right Common Femoral Vein: Demonstrated mild valvular insufficiency with a greater than (1) sec of duration. Mildly abnormal LE Venous duplex Doppler.  . Hypertension     Good control  . Palpitations     Relatively well controlled  . Obesity   . Bronchitis, chronic   . Asthma   . Inguinodynia     Bilateral groin pain  . Bilateral bunions     Bunionectomies performed  . Scoliosis     DG Chest 2 View x-ray on 07/30/11 by Dr. Everlene Farrier shows a scoliosis.    Past Surgical History  Procedure Laterality Date  . Bunionectomy Bilateral   . Knee arthroscopy Right   . Abdominal hysterectomy    . Ankle arthroscopy Left   . Left knee open surgery    . Total abdominal hysterectomy      History  Substance Use Topics  . Smoking status: Never Smoker   . Smokeless tobacco: Never Used  . Alcohol Use: Yes     Comment: One bottle of wine at Christmas    Family History  Problem Relation Age of Onset  . Arthritis Mother   . Hyperlipidemia Mother   . Diabetes Mother   . Gout Mother   . Hypertension Mother   .  Cancer Father     GI cancer  . Diabetes Maternal Grandfather   . Hypertension Brother   . Diabetes Brother   . Hypertension Brother     No Known Allergies  Medication list has been reviewed and updated.  Current Outpatient Prescriptions on File Prior to Visit  Medication Sig Dispense Refill  . lisinopril (PRINIVIL,ZESTRIL) 20 MG tablet Take 20 mg by mouth daily.      [name] Kitchen albuterol (PROVENTIL HFA;VENTOLIN HFA) 108 (90 BASE) MCG/ACT inhaler Inhale 2 puffs into the lungs every 6 (six) hours as needed for wheezing or shortness of breath.      [name] Kitchen albuterol (PROVENTIL  HFA;VENTOLIN HFA) 108 (90 BASE) MCG/ACT inhaler Inhale 2 puffs into the lungs every 4 (four) hours as needed for wheezing or shortness of breath (cough, shortness of breath or wheezing.).  1 Inhaler  12  . aspirin EC 81 MG tablet Take 81 mg by mouth daily.      . benzonatate (TESSALON) 100 MG capsule Take 1 capsule (100 mg total) by mouth 3 (three) times daily as needed for cough.  40 capsule  0  . calcium carbonate (OS-CAL) 600 MG TABS Take 600 mg by mouth 2 (two) times daily with a meal.      . chlorpheniramine-HYDROcodone (TUSSIONEX PENNKINETIC ER) 10-8 MG/5ML LQCR Take 5 mLs by mouth every 12 (twelve) hours as needed.  60 mL  0  . Cholecalciferol (VITAMIN D-3) 1000 UNITS CAPS Take 1 capsule by mouth daily.      [name] Kitchen estradiol (ESTRACE) 2 MG tablet Take 2 mg by mouth daily.      . fluticasone (VERAMYST) 27.5 MCG/SPRAY nasal spray Place 2 sprays into the nose daily.      . hydrochlorothiazide (HYDRODIURIL) 25 MG tablet Take 25 mg by mouth daily.      [name] Kitchen HYDROcodone-homatropine (HYCODAN) 5-1.5 MG/5ML syrup Take 5 mLs by mouth every 8 (eight) hours as needed for cough.  90 mL  0  . ipratropium (ATROVENT) 0.03 % nasal spray Place 2 sprays into the nose 4 (four) times daily.  30 mL  6  . lisinopril (PRINIVIL,ZESTRIL) 20 MG tablet TAKE 1 TABLET DAILY  90 tablet  1  . methocarbamol (ROBAXIN) 500 MG tablet TAKE 1 TABLET BY MOUTH FOUR TIMES DAILY  30 tablet  0  . pseudoephedrine-guaifenesin (MUCINEX D) 60-600 MG per tablet Take 1 tablet by mouth every 12 (twelve) hours.  18 tablet  0   No current facility-administered medications on file prior to visit.    Review of Systems:  I have reviewed the patient's medical history in detail and updated the computerized patient record.   Physical Examination: Filed Vitals:   10/07/13 0911  BP: 114/76  Pulse: 71  Temp: 98.9 F (37.2 C)  Resp: 18   Filed Vitals:   10/07/13 0911  Height: 5\' 5"  (1.651 m)  Weight: 218 lb 12.8 oz (99.247 kg)   Body mass  index is 36.41 kg/(m^2). Ideal Body Weight: Weight in (lb) to have BMI = 25: 149.9  GEN: WDWN, NAD, Non-toxic, A & O x 3 HEENT: Atraumatic, Normocephalic. Neck supple. No masses, No LAD. Ears and Nose: No external deformity. CV: RRR, No M/G/R. No JVD. No thrill. No extra heart sounds. PULM: CTA B, diffuse wheezes,  No crackles, rhonchi. No retractions. No resp. distress. No accessory muscle use. ABD: S, NT, ND, +BS. No rebound. No HSM. EXTR: No c/c/e NEURO Normal gait.  PSYCH: Normally interactive. Conversant. Not depressed or anxious  appearing.  Calm demeanor.    Assessment and Plan: Bronchitis with bronchospasm Albuterol and spacer zpak Phen c cod  Signed,  Ellison Carwin, MD   [facility] reading (PRIMARY) by  Dr. Ouida Sills  negative.

## 2013-10-17 ENCOUNTER — Encounter: Payer: Self-pay | Admitting: Cardiology

## 2013-10-17 ENCOUNTER — Ambulatory Visit (INDEPENDENT_AMBULATORY_CARE_PROVIDER_SITE_OTHER): Payer: BC Managed Care – PPO | Admitting: Cardiology

## 2013-10-17 VITALS — BP 132/72 | HR 84 | Ht 66.0 in | Wt 218.2 lb

## 2013-10-17 DIAGNOSIS — I340 Nonrheumatic mitral (valve) insufficiency: Secondary | ICD-10-CM

## 2013-10-17 DIAGNOSIS — E785 Hyperlipidemia, unspecified: Secondary | ICD-10-CM

## 2013-10-17 DIAGNOSIS — R609 Edema, unspecified: Secondary | ICD-10-CM

## 2013-10-17 DIAGNOSIS — I359 Nonrheumatic aortic valve disorder, unspecified: Secondary | ICD-10-CM

## 2013-10-17 DIAGNOSIS — I059 Rheumatic mitral valve disease, unspecified: Secondary | ICD-10-CM

## 2013-10-17 DIAGNOSIS — R600 Localized edema: Secondary | ICD-10-CM

## 2013-10-17 DIAGNOSIS — R002 Palpitations: Secondary | ICD-10-CM

## 2013-10-17 DIAGNOSIS — I10 Essential (primary) hypertension: Secondary | ICD-10-CM

## 2013-10-17 NOTE — Patient Instructions (Signed)
Echo is  Stable  If the extra bets give you a lot of trouble ,please give office a call- may add a new medication.  Otherwise continue your current medication.  Your physician wants you to follow-up in 6 month Dr Ellyn Hack.  You will receive a reminder letter in the mail two months in advance. If you don't receive a letter, please call our office to schedule the follow-up appointment.

## 2013-10-19 ENCOUNTER — Encounter: Payer: Self-pay | Admitting: Cardiology

## 2013-10-19 DIAGNOSIS — R002 Palpitations: Secondary | ICD-10-CM | POA: Insufficient documentation

## 2013-10-19 NOTE — Assessment & Plan Note (Signed)
Relatively stable on ACE inhibitor plus HCTZ.

## 2013-10-19 NOTE — Progress Notes (Signed)
PCP: Annye Asa, MD  Clinic Note: Chief Complaint  Patient presents with  . Follow-up    9 mo. had echo in Oct 2014, denies chest pain, dyspnea and swelling. went to PCP for a cold on 10/07/13    HPI: Lisa Miles is a 63 y.o. female with a Cardiovascular Problem List below who presents today for 9 month followup. She has had an echocardiogram done in October, with a plan for her to be in the spring. It showed mild lateral prolapse with moderate regurgitation moderate left atrial dilation. Relatively stable. Also mild aortic stenosis. EF of 60-65%.  Interval History: She presents today without any major complaints. She is recovering from a cold/bronchitis her PCP was treating -- this seems to be in annual process for her.. The bases he notes is that she is tired all the time after having to care for her mother. She is currently in rehabilitation, but is still taking quite a bit of her time. Because of that she is unable to the exercise she would like to. She currently denies any chest pressure with rest or exertion. No dyspnea with rest but with significant exertion she still has a little short of breath which is L-shaped. No significant edema but does have intermittent spells of edema. No PND orthopnea. No rapid or irregular heartbeat/palpitations just occasional no syncope/near-syncope TIA/amaurosis fugax. No melena, hematochezia hematuria. No claudication.  She does not occasions over last week she's been "hearing her heartbeat, sometimes it is fast"  Past Medical History  Diagnosis Date  . Heart disease   . Aortic stenosis, mild 07/2017    TEE  -- mild to moderate aortic stenosis which is a little more significant than the mild stenosis & moderate regurgitation which is stable. MILD mitral valve prolapse w/moderate regurgitation but no stenosis. Mild elevated pulmonary pressures were noted w/moderate tricuspid regurgitation. EF = 55-60% with grade 1 diastolic dysfunction.  .  Mitral regurgitation due to cusp prolapse 07/2017    TEE - mild to mod AS (~> mild) & mod AI -- stable. MILD MVP with Mod MR & no stenosis. Mild elevated PAP w/mod TR. EF = 55-60%, G1 DD;; Followup echo October 2014: EF 60-65%., Gr 1 DD, mild AS (peak/mean 34/20 mmHg), Mod MR w/ mild AMVL Prolapse  . Hyperlipidemia   . Lower extremity edema     Chronic. Venous Duplex 11/06/11 SUMMARY: 1) Bilateral Lower Extremities: No evidence of DVT or thrombophlebitis.  2) Right Common Femoral Vein: Demonstrated mild valvular insufficiency with a greater than (1) sec of duration. Mildly abnormal LE Venous duplex Doppler.  . Hypertension     Good control  . Palpitations     Relatively well controlled  . Obesity   . Bronchitis, chronic   . Asthma   . Inguinodynia     Bilateral groin pain  . Bilateral bunions     Bunionectomies performed  . Scoliosis     DG Chest 2 View x-ray on 07/30/11 by Dr. Everlene Farrier shows a scoliosis.    Prior Cardiac Evaluation and Past Surgical History: Reviewed MEDICATIONS AND ALLERGIES REVIEWED IN EPIC No Change in Social and Family History  ROS: A comprehensive Review of Systems - Negative except Recovering cold symptoms with congestion and still have a lingering cough. But otherwise no fevers or chills.  PHYSICAL EXAM BP 132/72  Pulse 84  Ht 5\' 6"  (1.676 m)  Wt 218 lb 3.2 oz (98.975 kg)  BMI 35.24 kg/m2 General appearance: Very pleasant, healthy-appearing Afro-American  woman. No acute distress, alert and oriented x3. Answers questions appropriately. Well-nourished and well-groomed. Normal mood and affect .  Neck: no adenopathy, no carotid bruit, no JVD and supple, symmetrical, trachea midline  Lungs: clear to auscultation bilaterally, normal percussion bilaterally and Nonlabored, good air movement.  Heart: normal apical impulse, regular rate and rhythm, S1, S2 normal, no S3 or S4, systolic murmur: systolic ejection 2/6, medium pitch, crescendo, decrescendo and Mid peaking  radiates to carotids, 2nd systolic murmur: holosystolic 2/6, blowing at lower left sternal border, at apex, radiates to axilla, no click, no rub and No clear diastolic murmur heard  Abdomen: soft, non-tender; bowel sounds normal; no masses, no organomegaly  Extremities: edema 1-2+ on the right, trace to 1+ on the left. Not currently wearing compression stockings., varicose veins noted and venous stasis dermatitis noted  Pulses: 2+ and symmetric  Neurologic: Grossly normal  HEENT: [place]/AT, EOMI, MMM, anicteric sclera   Adult ECG Report  Rate: 84 ;  Rhythm: normal sinus rhythm, premature atrial contractions (PAC), premature ventricular contractions (PVC) and Versus conducted PACs, NSST; stable EKG  Recent Labs December 2014:  TC 179, TG 104, HDL 54, LDL 104 - stable  ASSESSMENT / PLAN: Mitral regurgitation due to cusp prolapse - moderate MR with mild prolapse Essentially stable findings on echocardiogram. Can followup with an echocardiogram next spring. Monitor for symptoms of heart failure. She seemed to be relatively asymptomatic. Continue afterload reduction with ACE inhibitor.  Plan: Recheck echo spring of 2016  AORTIC VALVE DISORDERS - mild stenosis with moderate regurgitation Again stable findings on echo. Can wait till spring of 2016 to followup. Monitor for symptoms.  Essential hypertension Relatively stable on ACE inhibitor plus HCTZ.  Lower extremity edema Relatively stable.  Heart palpitations Would consider low-dose beta blocker if his symptoms get worse. It'll seem like true evidence, but the stomach possible PACs or PVCs. She has on a daily. I've asked that she contact us if the symptoms do get worse and I would start her on low-dose beta blocker.    Orders Placed This Encounter  Procedures  . EKG 12-Lead   No orders of the defined types were placed in this encounter.    Followup: 6 months  DAVID W. Ellyn Hack, M.D., M.S. Interventional  Cardiologist [facility]

## 2013-10-19 NOTE — Assessment & Plan Note (Signed)
Relatively stable. 

## 2013-10-19 NOTE — Assessment & Plan Note (Signed)
Would consider low-dose beta blocker if his symptoms get worse. It'll seem like true evidence, but the stomach possible PACs or PVCs. She has on a daily. I've asked that she contact us if the symptoms do get worse and I would start her on low-dose beta blocker.

## 2013-10-19 NOTE — Assessment & Plan Note (Signed)
Essentially stable findings on echocardiogram. Can followup with an echocardiogram next spring. Monitor for symptoms of heart failure. She seemed to be relatively asymptomatic. Continue afterload reduction with ACE inhibitor.  Plan: Recheck echo spring of 2016

## 2013-10-19 NOTE — Assessment & Plan Note (Signed)
Not on statin. Relatively well-controlled. Continue to follow.

## 2013-10-19 NOTE — Assessment & Plan Note (Signed)
Again stable findings on echo. Can wait till spring of 2016 to followup. Monitor for symptoms.

## 2013-10-22 ENCOUNTER — Other Ambulatory Visit (HOSPITAL_COMMUNITY): Payer: Self-pay | Admitting: Cardiology

## 2013-10-23 NOTE — Telephone Encounter (Signed)
Rx refill sent to patient pharmacy   

## 2014-02-04 ENCOUNTER — Other Ambulatory Visit (HOSPITAL_COMMUNITY): Payer: Self-pay | Admitting: Cardiology

## 2014-02-05 NOTE — Telephone Encounter (Signed)
Rx was sent to pharmacy electronically. 

## 2014-03-29 ENCOUNTER — Encounter (HOSPITAL_COMMUNITY): Payer: Self-pay | Admitting: *Deleted

## 2014-03-29 ENCOUNTER — Emergency Department (HOSPITAL_COMMUNITY)
Admission: EM | Admit: 2014-03-29 | Discharge: 2014-03-29 | Discharge status: Against Medical Advice | Payer: BC Managed Care – PPO | Attending: Emergency Medicine | Admitting: Emergency Medicine

## 2014-03-29 ENCOUNTER — Emergency Department (HOSPITAL_COMMUNITY): Payer: BC Managed Care – PPO

## 2014-03-29 DIAGNOSIS — R079 Chest pain, unspecified: Secondary | ICD-10-CM

## 2014-03-29 DIAGNOSIS — Z79899 Other long term (current) drug therapy: Secondary | ICD-10-CM | POA: Diagnosis not present

## 2014-03-29 DIAGNOSIS — Z7982 Long term (current) use of aspirin: Secondary | ICD-10-CM | POA: Diagnosis not present

## 2014-03-29 DIAGNOSIS — R55 Syncope and collapse: Secondary | ICD-10-CM

## 2014-03-29 DIAGNOSIS — I10 Essential (primary) hypertension: Secondary | ICD-10-CM | POA: Insufficient documentation

## 2014-03-29 DIAGNOSIS — Z8679 Personal history of other diseases of the circulatory system: Secondary | ICD-10-CM | POA: Diagnosis not present

## 2014-03-29 DIAGNOSIS — J45909 Unspecified asthma, uncomplicated: Secondary | ICD-10-CM | POA: Insufficient documentation

## 2014-03-29 DIAGNOSIS — E669 Obesity, unspecified: Secondary | ICD-10-CM | POA: Insufficient documentation

## 2014-03-29 LAB — BASIC METABOLIC PANEL
Anion gap: 14 (ref 5–15)
BUN: 14 mg/dL (ref 6–23)
CO2: 24 mEq/L (ref 19–32)
Calcium: 9.1 mg/dL (ref 8.4–10.5)
Chloride: 99 mEq/L (ref 96–112)
Creatinine, Ser: 0.7 mg/dL (ref 0.50–1.10)
GFR calc Af Amer: >90 mL/min (ref >90)
GFR calc non Af Amer: >90 mL/min (ref >90)
Glucose, Bld: 129 mg/dL — ABNORMAL HIGH (ref 70–99)
Potassium: 3.8 mEq/L (ref 3.7–5.3)
Sodium: 137 mEq/L (ref 137–147)

## 2014-03-29 LAB — CBC
HCT: 36.8 % (ref 36.0–46.0)
Hemoglobin: 12.2 g/dL (ref 12.0–15.0)
MCH: 29.3 pg (ref 26.0–34.0)
MCHC: 33.2 g/dL (ref 30.0–36.0)
MCV: 88.5 fL (ref 78.0–100.0)
Platelets: 245 10*3/uL (ref 150–400)
RBC: 4.16 MIL/uL (ref 3.87–5.11)
RDW: 12.8 % (ref 11.5–15.5)
WBC: 4.1 10*3/uL (ref 4.0–10.5)

## 2014-03-29 LAB — I-STAT TROPONIN, ED: Troponin i, poc: 0 ng/mL (ref 0.00–0.08)

## 2014-03-29 MED ORDER — ASPIRIN 81 MG PO CHEW
324.0000 mg | CHEWABLE_TABLET | Freq: Once | ORAL | Status: AC
  Administered 2014-03-29: 324 mg via ORAL
  Filled 2014-03-29: qty 4

## 2014-03-29 NOTE — ED Notes (Signed)
Ward, MD at bedside. 

## 2014-03-29 NOTE — Discharge Instructions (Signed)
You were seen in the emergency department for an episode of almost passing out and chest pain. Given you do have risk factors for heart disease we have recommended that she stay in the hospital for further evaluation. You have decided to leave [place]. Please understand that there may be life-threatening illness present that we have not currently detected. If you develop worsening symptoms I recommend he return to the hospital. I recommended you follow-up with your primary care physician tomorrow to schedule an appointment to be seen and a possible outpatient stress test and echocardiogram.    Chest Pain (Nonspecific) It is often hard to give a specific diagnosis for the cause of chest pain. There is always a chance that your pain could be related to something serious, such as a heart attack or a blood clot in the lungs. You need to follow up with your health care provider for further evaluation. CAUSES   Heartburn.  Pneumonia or bronchitis.  Anxiety or stress.  Inflammation around your heart (pericarditis) or lung (pleuritis or pleurisy).  A blood clot in the lung.  A collapsed lung (pneumothorax). It can develop suddenly on its own (spontaneous pneumothorax) or from trauma to the chest.  Shingles infection (herpes zoster virus). The chest wall is composed of bones, muscles, and cartilage. Any of these can be the source of the pain.  The bones can be bruised by injury.  The muscles or cartilage can be strained by coughing or overwork.  The cartilage can be affected by inflammation and become sore (costochondritis). DIAGNOSIS  Lab tests or other studies may be needed to find the cause of your pain. Your health care provider may have you take a test called an ambulatory electrocardiogram (ECG). An ECG records your heartbeat patterns over a 24-hour period. You may also have other tests, such as:  Transthoracic echocardiogram (TTE). During echocardiography, sound waves are  used to evaluate how blood flows through your heart.  Transesophageal echocardiogram (TEE).  Cardiac monitoring. This allows your health care provider to monitor your heart rate and rhythm in real time.  Holter monitor. This is a portable device that records your heartbeat and can help diagnose heart arrhythmias. It allows your health care provider to track your heart activity for several days, if needed.  Stress tests by exercise or by giving medicine that makes the heart beat faster. TREATMENT   Treatment depends on what may be causing your chest pain. Treatment may include:  Acid blockers for heartburn.  Anti-inflammatory medicine.  Pain medicine for inflammatory conditions.  Antibiotics if an infection is present.  You may be advised to change lifestyle habits. This includes stopping smoking and avoiding alcohol, caffeine, and chocolate.  You may be advised to keep your head raised (elevated) when sleeping. This reduces the chance of acid going backward from your stomach into your esophagus. Most of the time, nonspecific chest pain will improve within 2-3 days with rest and mild pain medicine.  HOME CARE INSTRUCTIONS   If antibiotics were prescribed, take them as directed. Finish them even if you start to feel better.  For the next few days, avoid physical activities that bring on chest pain. Continue physical activities as directed.  Do not use any tobacco products, including cigarettes, chewing tobacco, or electronic cigarettes.  Avoid drinking alcohol.  Only take medicine as directed by your health care provider.  Follow your health care provider's suggestions for further testing if your chest pain does not go away.  Keep any  follow-up appointments you made. If you do not go to an appointment, you could develop lasting (chronic) problems with pain. If there is any problem keeping an appointment, call to reschedule. SEEK MEDICAL CARE IF:   Your chest pain does not go  away, even after treatment.  You have a rash with blisters on your chest.  You have a fever. SEEK IMMEDIATE MEDICAL CARE IF:   You have increased chest pain or pain that spreads to your arm, neck, jaw, back, or abdomen.  You have shortness of breath.  You have an increasing cough, or you cough up blood.  You have severe back or abdominal pain.  You feel nauseous or vomit.  You have severe weakness.  You faint.  You have chills. This is an emergency. Do not wait to see if the pain will go away. Get medical help at once. Call your local emergency services (911 in [place] Do not drive yourself to the hospital. MAKE SURE YOU:   Understand these instructions.  Will watch your condition.  Will get help right away if you are not doing well or get worse. Document Released: 01/21/2005 Document Revised: 04/18/2013 Document Reviewed: 11/17/2007 [facility] Patient Information 2015 [place], [place]. This information is not intended to replace advice given to you by your health care provider. Make sure you discuss any questions you have with your health care provider.  Near-Syncope Near-syncope (commonly known as near fainting) is sudden weakness, dizziness, or feeling like you might pass out. During an episode of near-syncope, you may also develop pale skin, have tunnel vision, or feel sick to your stomach (nauseous). Near-syncope may occur when getting up after sitting or while standing for a long time. It is caused by a sudden decrease in blood flow to the brain. This decrease can result from various causes or triggers, most of which are not serious. However, because near-syncope can sometimes be a sign of something serious, a medical evaluation is required. The specific cause is often not determined. HOME CARE INSTRUCTIONS  Monitor your condition for any changes. The following actions may help to alleviate any discomfort you are experiencing:  Have someone stay with you until you feel  stable.  Lie down right away and prop your feet up if you start feeling like you might faint. Breathe deeply and steadily. Wait until all the symptoms have passed. Most of these episodes last only a few minutes. You may feel tired for several hours.   Drink enough fluids to keep your urine clear or pale yellow.   If you are taking blood pressure or heart medicine, get up slowly when seated or lying down. Take several minutes to sit and then stand. This can reduce dizziness.  Follow up with your health care provider as directed. SEEK IMMEDIATE MEDICAL CARE IF:   You have a severe headache.   You have unusual pain in the chest, abdomen, or back.   You are bleeding from the mouth or rectum, or you have black or tarry stool.   You have an irregular or very fast heartbeat.   You have repeated fainting or have seizure-like jerking during an episode.   You faint when sitting or lying down.   You have confusion.   You have difficulty walking.   You have severe weakness.   You have vision problems.  MAKE SURE YOU:   Understand these instructions.  Will watch your condition.  Will get help right away if you are not doing well or get worse.  Document Released: 04/13/2005 Document Revised: 04/18/2013 Document Reviewed: 09/16/2012 [facility] [place] [facility] Patient Information 2015 [place], [place]. This information is not intended to replace advice given to you by your health care provider. Make sure you discuss any questions you have with your health care provider.

## 2014-03-29 NOTE — ED Provider Notes (Signed)
TIME SEEN: 7:50 PM  CHIEF COMPLAINT: Chest pain, near syncope  HPI: Pt is a 63 y.o. F with history of aortic stenosis, hyperlipidemia, hypertension, family history of coronary artery disease who presents to the emergency department with left-sided chest pressure today without radiation, diaphoresis, nausea and shortness of breath that occurred while at work today. She also felt that she may pass out. States that she is feeling better without intervention. No aggravating or relieving factors. No calf swelling or tenderness. No fevers, cough, vomiting or diarrhea. No history of PE or DVT. Denies a prior history of stress test or cardiac catheterization. Denies a history of tobacco use.  ROS: See HPI Constitutional: no fever  Eyes: no drainage  ENT: no runny nose   Cardiovascular:   chest pain  Resp:  SOB  GI: no vomiting GU: no dysuria Integumentary: no rash  Allergy: no hives  Musculoskeletal: no leg swelling  Neurological: no slurred speech ROS otherwise negative  PAST MEDICAL HISTORY/PAST SURGICAL HISTORY:  Past Medical History  Diagnosis Date  . Heart disease   . Aortic stenosis, mild 07/2017    TEE  -- mild to moderate aortic stenosis which is a little more significant than the mild stenosis & moderate regurgitation which is stable. MILD mitral valve prolapse w/moderate regurgitation but no stenosis. Mild elevated pulmonary pressures were noted w/moderate tricuspid regurgitation. EF = 55-60% with grade 1 diastolic dysfunction.  . Mitral regurgitation due to cusp prolapse 07/2017    TEE - mild to mod AS (~> mild) & mod AI -- stable. MILD MVP with Mod MR & no stenosis. Mild elevated PAP w/mod TR. EF = 55-60%, G1 DD;; Followup echo October 2014: EF 60-65%., Gr 1 DD, mild AS (peak/mean 34/20 mmHg), Mod MR w/ mild AMVL Prolapse  . Hyperlipidemia   . Lower extremity edema     Chronic. Venous Duplex 11/06/11 SUMMARY: 1) Bilateral Lower Extremities: No evidence of DVT or thrombophlebitis.  2)  Right Common Femoral Vein: Demonstrated mild valvular insufficiency with a greater than (1) sec of duration. Mildly abnormal LE Venous duplex Doppler.  . Hypertension     Good control  . Palpitations     Relatively well controlled  . Obesity   . Bronchitis, chronic   . Asthma   . Inguinodynia     Bilateral groin pain  . Bilateral bunions     Bunionectomies performed  . Scoliosis     DG Chest 2 View x-ray on 07/30/11 by Dr. Everlene Farrier shows a scoliosis.    MEDICATIONS:  Prior to Admission medications   Medication Sig Start Date End Date Taking? Authorizing Provider  aspirin EC 81 MG tablet Take 81 mg by mouth daily.    Historical Provider, MD  calcium carbonate (OS-CAL) 600 MG TABS Take 600 mg by mouth 2 (two) times daily with a meal.    Historical Provider, MD  Cholecalciferol (VITAMIN D-3) 1000 UNITS CAPS Take 1 capsule by mouth daily.    Historical Provider, MD  estradiol (ESTRACE) 2 MG tablet Take 2 mg by mouth daily.    Historical Provider, MD  hydrochlorothiazide (HYDRODIURIL) 25 MG tablet Take 25 mg by mouth daily.    Historical Provider, MD  hydrochlorothiazide (HYDRODIURIL) 25 MG tablet TAKE 1 TABLET DAILY    Leonie Man, MD  lisinopril (PRINIVIL,ZESTRIL) 20 MG tablet Take 20 mg by mouth daily.    Historical Provider, MD  lisinopril (PRINIVIL,ZESTRIL) 20 MG tablet Take 1 tablet (20 mg total) by mouth daily. 02/05/14  Leonie Man, MD    ALLERGIES:  No Known Allergies  SOCIAL HISTORY:  History  Substance Use Topics  . Smoking status: Never Smoker   . Smokeless tobacco: Never Used  . Alcohol Use: Yes     Comment: One bottle of wine at Christmas    FAMILY HISTORY: Family History  Problem Relation Age of Onset  . Arthritis Mother   . Hyperlipidemia Mother   . Diabetes Mother   . Gout Mother   . Hypertension Mother   . Cancer Father     GI cancer  . Diabetes Maternal Grandfather   . Hypertension Brother   . Diabetes Brother   . Hypertension Brother      EXAM: BP 113/56 mmHg  Pulse 55  Temp(Src) 97.6 F (36.4 C) (Oral)  Resp 22  Ht 5\' 6"  (1.676 m)  Wt 225 lb (102.059 kg)  BMI 36.33 kg/m2  SpO2 99% CONSTITUTIONAL: Alert and oriented and responds appropriately to questions. Well-appearing; well-nourished HEAD: Normocephalic EYES: Conjunctivae clear, PERRL ENT: normal nose; no rhinorrhea; moist mucous membranes; pharynx without lesions noted NECK: Supple, no meningismus, no LAD  CARD: RRR; S1 and S2 appreciated; no murmurs, no clicks, no rubs, no gallops RESP: Normal chest excursion without splinting or tachypnea; breath sounds clear and equal bilaterally; no wheezes, no rhonchi, no rales, no hypoxia or respiratory distress ABD/GI: Normal bowel sounds; non-distended; soft, non-tender, no rebound, no guarding BACK:  The back appears normal and is non-tender to palpation, there is no CVA tenderness EXT: Normal ROM in all joints; non-tender to palpation; no edema; normal capillary refill; no cyanosis; no calf tenderness or swelling; equal pulses in all 4 extremities SKIN: Normal color for age and race; warm NEURO: Moves all extremities equally; normal gait, cranial nerves II through XII intact, normal sensation diffusely PSYCH: The patient's mood and manner are appropriate. Grooming and personal hygiene are appropriate.  MEDICAL DECISION MAKING: Pt here with multiple risk factors for ACS with concerning story for chest pain and near-syncope. Have recommended admission for observation. Her labs are unremarkable including a negative troponin. EKG is nonischemic. Chest x-ray clear. She is currently asymptomatic. Have discussed at length with her and her husband why recommend she stay in the hospital for further evaluation. At this time they refuse admission. She states she will follow-up with her primary care physician. Discussed with patient that she will need to sign out [place] that there could be life-threatening illness  present that we are not detecting. Have strongly advised her if her symptoms return or she changes her mind to return to the hospital. She is able to verbalize risks back to me and appears competent to make this decision.      EKG Interpretation  Date/Time:  Thursday March 29 2014 17:45:06 EST Ventricular Rate:  69 PR Interval:  148 QRS Duration: 86 QT Interval:  406 QTC Calculation: 435 R Axis:   65 Text Interpretation:  Normal sinus rhythm Normal ECG Confirmed by Eytan Carrigan,  DO, Aran Menning ([id number]) on 03/29/2014 7:50:50 PM           [place], DO 03/29/14 2133

## 2014-03-29 NOTE — ED Notes (Signed)
This RN explained to the pt the risks of leaving AMA. Pt verbally acknowledged, and has signed the AMA form.

## 2014-03-29 NOTE — ED Notes (Signed)
Patient states intermittent episodes of mid sternal chest pain today, patient states some nausea but denies vomiting, patient states some cold sweats with pain episodes

## 2014-05-08 ENCOUNTER — Other Ambulatory Visit: Payer: Self-pay | Admitting: Obstetrics & Gynecology

## 2014-05-09 LAB — CYTOLOGY - PAP

## 2014-05-22 ENCOUNTER — Telehealth: Payer: Self-pay

## 2014-05-22 NOTE — Telephone Encounter (Signed)
Left a message for call back.   Pap-05/08/14-ordered; no results in chart CCS- 03/29/11- normal  MMG-03/28/12-ordered, no results in chart BD- 03/28/12-ordered, no results in chart  Flu-DUE Td- 03/28/13 UTD PNA- 04/02/09 UTD Zoster- DUE

## 2014-05-23 ENCOUNTER — Ambulatory Visit (INDEPENDENT_AMBULATORY_CARE_PROVIDER_SITE_OTHER): Payer: BLUE CROSS/BLUE SHIELD | Admitting: Family Medicine

## 2014-05-23 ENCOUNTER — Encounter: Payer: Self-pay | Admitting: Family Medicine

## 2014-05-23 VITALS — BP 124/74 | HR 71 | Temp 98.2°F | Resp 16 | Ht 65.5 in | Wt 219.2 lb

## 2014-05-23 DIAGNOSIS — E049 Nontoxic goiter, unspecified: Secondary | ICD-10-CM

## 2014-05-23 DIAGNOSIS — B369 Superficial mycosis, unspecified: Secondary | ICD-10-CM | POA: Insufficient documentation

## 2014-05-23 DIAGNOSIS — Z0000 Encounter for general adult medical examination without abnormal findings: Secondary | ICD-10-CM

## 2014-05-23 DIAGNOSIS — E010 Iodine-deficiency related diffuse (endemic) goiter: Secondary | ICD-10-CM

## 2014-05-23 LAB — HEPATIC FUNCTION PANEL
ALT: 15 U/L (ref 0–35)
AST: 18 U/L (ref 0–37)
Albumin: 3.5 g/dL (ref 3.5–5.2)
Alkaline Phosphatase: 34 U/L — ABNORMAL LOW (ref 39–117)
Bilirubin, Direct: 0 mg/dL (ref 0.0–0.3)
Total Bilirubin: 0.2 mg/dL (ref 0.2–1.2)
Total Protein: 6.8 g/dL (ref 6.0–8.3)

## 2014-05-23 LAB — BASIC METABOLIC PANEL
BUN: 16 mg/dL (ref 6–23)
CO2: 27 mEq/L (ref 19–32)
Calcium: 9.3 mg/dL (ref 8.4–10.5)
Chloride: 104 mEq/L (ref 96–112)
Creatinine, Ser: 0.76 mg/dL (ref 0.40–1.20)
GFR: 98.74 mL/min (ref >60.00)
Glucose, Bld: 99 mg/dL (ref 70–99)
Potassium: 3.8 mEq/L (ref 3.5–5.1)
Sodium: 137 mEq/L (ref 135–145)

## 2014-05-23 LAB — CBC WITH DIFFERENTIAL/PLATELET
Basophils Absolute: 0 10*3/uL (ref 0.0–0.1)
Basophils Relative: 0.7 % (ref 0.0–3.0)
Eosinophils Absolute: 0.2 10*3/uL (ref 0.0–0.7)
Eosinophils Relative: 4.8 % (ref 0.0–5.0)
HCT: 37.9 % (ref 36.0–46.0)
Hemoglobin: 13 g/dL (ref 12.0–15.0)
Lymphocytes Relative: 31.8 % (ref 12.0–46.0)
Lymphs Abs: 1.3 10*3/uL (ref 0.7–4.0)
MCHC: 34.3 g/dL (ref 30.0–36.0)
MCV: 88.1 fl (ref 78.0–100.0)
Monocytes Absolute: 0.4 10*3/uL (ref 0.1–1.0)
Monocytes Relative: 9.7 % (ref 3.0–12.0)
Neutro Abs: 2.1 10*3/uL (ref 1.4–7.7)
Neutrophils Relative %: 53 % (ref 43.0–77.0)
Platelets: 257 10*3/uL (ref 150.0–400.0)
RBC: 4.3 Mil/uL (ref 3.87–5.11)
RDW: 13.1 % (ref 11.5–15.5)
WBC: 4 10*3/uL (ref 4.0–10.5)

## 2014-05-23 LAB — LIPID PANEL
Cholesterol: 188 mg/dL (ref 0–200)
HDL: 56.6 mg/dL (ref >39.00)
LDL Cholesterol: 109 mg/dL — ABNORMAL HIGH (ref 0–99)
NonHDL: 131.4
Total CHOL/HDL Ratio: 3
Triglycerides: 114 mg/dL (ref 0.0–149.0)
VLDL: 22.8 mg/dL (ref 0.0–40.0)

## 2014-05-23 LAB — VITAMIN D 25 HYDROXY (VIT D DEFICIENCY, FRACTURES): VITD: 38.14 ng/mL (ref 30.00–100.00)

## 2014-05-23 LAB — TSH: TSH: 3.77 u[IU]/mL (ref 0.35–4.50)

## 2014-05-23 MED ORDER — CLOTRIMAZOLE-BETAMETHASONE 1-0.05 % EX CREA: 1.0000 "application " | TOPICAL_CREAM | Freq: Two times a day (BID) | CUTANEOUS | Status: DC

## 2014-05-23 NOTE — Assessment & Plan Note (Signed)
New.  Start Lotrisone cream.  Encouraged pt to keep area clean and dry.

## 2014-05-23 NOTE — Progress Notes (Signed)
Pre visit review using our clinic review tool, if applicable. No additional management support is needed unless otherwise documented below in the visit note. 

## 2014-05-23 NOTE — Assessment & Plan Note (Signed)
Pt's PE WNL w/ exception of obesity and fungal dermatitis.  UTD on GYN, mammo, colonoscopy.  Check labs.  Anticipatory guidance provided.

## 2014-05-23 NOTE — Patient Instructions (Signed)
Follow up in 6 months to recheck BP We'll notify you of your lab results and make any changes if needed Apply the Lotrisone cream twice daily as needed Try and make healthy food choices and get regular exercise We'll call you with your [place] appt for the thyroid Call with any questions or concerns Happy New Year!

## 2014-05-23 NOTE — Assessment & Plan Note (Signed)
Last [place] recommended repeat image in 1 yr- order entered.

## 2014-05-23 NOTE — Progress Notes (Signed)
   Subjective:    Patient ID: Lisa Miles, female    DOB: January 25, 1951, 64 y.o.   MRN: [id number]  HPI CPE- UTD on colonoscopy, GYN, mammo (at [facility]).  UTD on flu 02/20/14.    Reviewed PMH/PSH  Review of Systems Patient reports no vision/ hearing changes, adenopathy,fever, weight change,  persistant/recurrent hoarseness , swallowing issues, palpitations, edema, persistant/recurrent cough, hemoptysis, dyspnea (rest/exertional/paroxysmal nocturnal), gastrointestinal bleeding (melena, rectal bleeding), significant heartburn, bowel changes, GU symptoms (dysuria, hematuria, incontinence), Gyn symptoms (abnormal  bleeding, pain),  syncope, focal weakness, memory loss, hair/nail changes, abnormal bruising or bleeding, anxiety, or depression.  + CP- pt went to ER in Dec but had normal w/u  Bilateral pelvic pain radiating to buttock- occurs intermittently Intermittent foot numbness- pt unable to relate which leg, 'maybe both' + fungal dermatitis in breast and pannus creases    Objective:   Physical Exam General Appearance:    Alert, cooperative, no distress, appears stated age  Head:    Normocephalic, without obvious abnormality, atraumatic  Eyes:    PERRL, conjunctiva/corneas clear, EOM's intact, fundi    benign, both eyes  Ears:    Normal TM's and external ear canals, both ears  Nose:   Nares normal, septum midline, mucosa normal, no drainage    or sinus tenderness  Throat:   Lips, mucosa, and tongue normal; teeth and gums normal  Neck:   Supple, symmetrical, trachea midline, no adenopathy;    Thyroid: diffuse enlargement  Back:     Symmetric, no curvature, ROM normal, no CVA tenderness  Lungs:     Clear to auscultation bilaterally, respirations unlabored  Chest Wall:    No tenderness or deformity   Heart:    Regular rate and rhythm, S1 and S2 normal, no murmur, rub   or gallop  Breast Exam:    Deferred to GYN  Abdomen:     Soft, non-tender, bowel sounds active all four quadrants,    no  masses, no organomegaly  Genitalia:    Deferred to GYN  Rectal:    Extremities:   Extremities normal, atraumatic, no cyanosis or edema  Pulses:   2+ and symmetric all extremities  Skin:   Skin color, texture, turgor normal, no rashes or lesions  Lymph nodes:   Cervical, supraclavicular, and axillary nodes normal  Neurologic:   CNII-XII intact, normal strength, sensation and reflexes    throughout          Assessment & Plan:   Problem List Items Addressed This Visit    Thyromegaly    Last [place] recommended repeat image in 1 yr- order entered.      Relevant Orders   US Soft Tissue Head/Neck   Routine general medical examination at a health care facility - Primary    Pt's PE WNL w/ exception of obesity and fungal dermatitis.  UTD on GYN, mammo, colonoscopy.  Check labs.  Anticipatory guidance provided.       Relevant Orders   Lipid panel   Basic metabolic panel   Hepatic function panel   TSH   CBC with Differential/Platelet   Vitamin D (25 hydroxy)   Fungal dermatitis    New.  Start Lotrisone cream.  Encouraged pt to keep area clean and dry.      Relevant Medications   LOTRISONE 1-0.05 % EX CREA

## 2014-05-25 ENCOUNTER — Ambulatory Visit (HOSPITAL_BASED_OUTPATIENT_CLINIC_OR_DEPARTMENT_OTHER): Payer: BLUE CROSS/BLUE SHIELD

## 2014-05-28 ENCOUNTER — Ambulatory Visit (HOSPITAL_BASED_OUTPATIENT_CLINIC_OR_DEPARTMENT_OTHER)
Admission: RE | Admit: 2014-05-28 | Discharge: 2014-05-28 | Disposition: A | Discharge status: Home / Self Care | Payer: BLUE CROSS/BLUE SHIELD | Source: Ambulatory Visit | Attending: Family Medicine | Admitting: Family Medicine

## 2014-05-28 ENCOUNTER — Telehealth: Payer: Self-pay | Admitting: Family Medicine

## 2014-05-28 DIAGNOSIS — E042 Nontoxic multinodular goiter: Secondary | ICD-10-CM | POA: Diagnosis not present

## 2014-05-28 DIAGNOSIS — E010 Iodine-deficiency related diffuse (endemic) goiter: Secondary | ICD-10-CM

## 2014-05-28 NOTE — Telephone Encounter (Signed)
Caller name: Ceil Relation to pt: Call back number: [phone] Pharmacy:  Reason for call: Pt came in office stating that on last visit to our office forgot to ask for her to have a stress test done. Pt states was at the emergency room last March 29, 2014 for chest pain and wants to have a stress test done also. Please advise.

## 2014-05-29 NOTE — Telephone Encounter (Signed)
Pt has Cardiology (Dr Ellyn Hack).  If she is interested in pursuing a stress test, she will need to follow up with him.

## 2014-05-29 NOTE — Telephone Encounter (Signed)
Pt notified, stated an understanding.

## 2014-05-29 NOTE — Telephone Encounter (Signed)
Forwarded to Dr. Darene Lamer and Keane Scrape T.

## 2014-06-01 ENCOUNTER — Encounter: Payer: Self-pay | Admitting: General Practice

## 2014-06-04 ENCOUNTER — Telehealth: Payer: Self-pay | Admitting: Family Medicine

## 2014-06-04 DIAGNOSIS — E041 Nontoxic single thyroid nodule: Secondary | ICD-10-CM

## 2014-06-04 NOTE — Telephone Encounter (Signed)
Letter states "Dominant thyroid nodule is now large enough to be biopsied. Recommend ENT referral for fine needle aspiration."  Pt was unfamiliar with terminology.  The same was explained to patient.  She is now agreeable to the referral.  ENT referral ordered.

## 2014-06-04 NOTE — Telephone Encounter (Signed)
Caller name: Keir, Truly Stankiewicz Relation to pt: self  Call back number: [phone] Pharmacy:  Reason for call:  Pt would like to discuss letter regarding thyroid nodule

## 2014-06-04 NOTE — Telephone Encounter (Signed)
Pt returning your call

## 2014-06-04 NOTE — Telephone Encounter (Signed)
Left a message for call back.  

## 2014-06-07 ENCOUNTER — Telehealth: Payer: Self-pay | Admitting: Family Medicine

## 2014-06-07 DIAGNOSIS — I340 Nonrheumatic mitral (valve) insufficiency: Secondary | ICD-10-CM

## 2014-06-07 DIAGNOSIS — I359 Nonrheumatic aortic valve disorder, unspecified: Secondary | ICD-10-CM

## 2014-06-07 DIAGNOSIS — R102 Pelvic and perineal pain: Secondary | ICD-10-CM

## 2014-06-07 NOTE — Telephone Encounter (Signed)
New referral placed.

## 2014-06-07 NOTE — Telephone Encounter (Signed)
Referral placed.

## 2014-06-07 NOTE — Telephone Encounter (Signed)
Caller name: Ailine Relation to pt: self Call back number: [phone] up to 2pm. After 2pm ok to leave detailed message Pharmacy:  Reason for call:   Patient states that she is having uterus pain into back. She called her gynocologist but they told her to call our office and get referral.  Appointment has already been scheduled. Dr. Evette Cristal at [facility]

## 2014-06-07 NOTE — Telephone Encounter (Signed)
Patient states that she gave [place] the wrong info.  She does not need referral to Dr. Nori Riis, she needs referral to Dr. Ellyn Hack at [facility] for a stress test.

## 2014-06-07 NOTE — Addendum Note (Signed)
Addended by: Kris Hartmann on: 06/07/2014 01:49 PM   Modules accepted: Orders

## 2014-06-26 ENCOUNTER — Telehealth: Payer: Self-pay | Admitting: Cardiology

## 2014-06-26 NOTE — Telephone Encounter (Signed)
Patient called stating she was wondering if she had a stress test scheduled on 07-04-14.  I called patient back and explained that she only had a 6 month office visit with Dr. Ellyn Hack on 07-04-14.  She will talk to Dr. Ellyn Hack about ordering a stress test as Dr. Birdie Riddle had stated she needed to have one.

## 2014-06-27 ENCOUNTER — Telehealth: Payer: Self-pay | Admitting: Family Medicine

## 2014-06-27 ENCOUNTER — Other Ambulatory Visit: Payer: Self-pay | Admitting: Family Medicine

## 2014-06-27 DIAGNOSIS — M545 Low back pain: Secondary | ICD-10-CM

## 2014-06-27 DIAGNOSIS — E041 Nontoxic single thyroid nodule: Secondary | ICD-10-CM

## 2014-06-27 DIAGNOSIS — M25559 Pain in unspecified hip: Secondary | ICD-10-CM

## 2014-06-27 NOTE — Telephone Encounter (Signed)
Pt has an appt with Arthritis doctor on 08/20/14. Pt states that she is having pain in pelvic area on both sides, Also having joint pain from hips to ankles in both legs. States that she can feel the pain "in the bone" when she walks. Please advise?

## 2014-06-27 NOTE — Telephone Encounter (Signed)
Caller name: Neuman, Celita Aron Relation to pt: self  Call back number: [phone] Pharmacy:  Reason for call:  Pt states she is in pain requesting orders for MRI. Please advise

## 2014-06-27 NOTE — Telephone Encounter (Signed)
Orders placed and pt notified.

## 2014-06-27 NOTE — Telephone Encounter (Signed)
Prior to ordering MRI, pt would need to have plain films done of lumbar spine and hips.

## 2014-06-28 ENCOUNTER — Ambulatory Visit (HOSPITAL_BASED_OUTPATIENT_CLINIC_OR_DEPARTMENT_OTHER)
Admission: RE | Admit: 2014-06-28 | Discharge: 2014-06-28 | Disposition: A | Discharge status: Home / Self Care | Payer: BLUE CROSS/BLUE SHIELD | Source: Ambulatory Visit | Attending: Family Medicine | Admitting: Family Medicine

## 2014-06-28 ENCOUNTER — Other Ambulatory Visit: Payer: Self-pay | Admitting: General Practice

## 2014-06-28 DIAGNOSIS — M25559 Pain in unspecified hip: Secondary | ICD-10-CM

## 2014-06-28 DIAGNOSIS — M545 Low back pain: Secondary | ICD-10-CM

## 2014-06-28 DIAGNOSIS — M1288 Other specific arthropathies, not elsewhere classified, other specified site: Secondary | ICD-10-CM | POA: Diagnosis not present

## 2014-06-28 DIAGNOSIS — M869 Osteomyelitis, unspecified: Secondary | ICD-10-CM

## 2014-06-28 DIAGNOSIS — M25551 Pain in right hip: Secondary | ICD-10-CM | POA: Diagnosis not present

## 2014-06-28 DIAGNOSIS — M25552 Pain in left hip: Secondary | ICD-10-CM | POA: Insufficient documentation

## 2014-06-28 DIAGNOSIS — M8538 Osteitis condensans, other site: Secondary | ICD-10-CM | POA: Diagnosis not present

## 2014-06-28 DIAGNOSIS — M5136 Other intervertebral disc degeneration, lumbar region: Secondary | ICD-10-CM

## 2014-06-28 DIAGNOSIS — M5440 Lumbago with sciatica, unspecified side: Secondary | ICD-10-CM | POA: Insufficient documentation

## 2014-06-28 MED ORDER — MELOXICAM 15 MG PO TABS: 15.0000 mg | ORAL_TABLET | Freq: Every day | ORAL | Status: DC

## 2014-07-02 ENCOUNTER — Telehealth: Payer: Self-pay | Admitting: Cardiology

## 2014-07-02 NOTE — Telephone Encounter (Signed)
Close encounter 

## 2014-07-04 ENCOUNTER — Ambulatory Visit: Payer: BLUE CROSS/BLUE SHIELD | Admitting: Cardiology

## 2014-07-23 ENCOUNTER — Other Ambulatory Visit (HOSPITAL_COMMUNITY): Payer: Self-pay | Admitting: Cardiology

## 2014-07-23 NOTE — Telephone Encounter (Signed)
Rx has been sent to the pharmacy electronically. ° °

## 2014-08-17 ENCOUNTER — Ambulatory Visit: Payer: BLUE CROSS/BLUE SHIELD | Admitting: Cardiology

## 2014-08-30 ENCOUNTER — Encounter: Payer: Self-pay | Admitting: Cardiology

## 2014-08-30 ENCOUNTER — Ambulatory Visit (INDEPENDENT_AMBULATORY_CARE_PROVIDER_SITE_OTHER): Payer: BLUE CROSS/BLUE SHIELD | Admitting: Cardiology

## 2014-08-30 VITALS — BP 100/60 | HR 67 | Ht 66.0 in | Wt 219.9 lb

## 2014-08-30 DIAGNOSIS — R079 Chest pain, unspecified: Secondary | ICD-10-CM

## 2014-08-30 DIAGNOSIS — R600 Localized edema: Secondary | ICD-10-CM

## 2014-08-30 DIAGNOSIS — I10 Essential (primary) hypertension: Secondary | ICD-10-CM

## 2014-08-30 DIAGNOSIS — E669 Obesity, unspecified: Secondary | ICD-10-CM

## 2014-08-30 DIAGNOSIS — I340 Nonrheumatic mitral (valve) insufficiency: Secondary | ICD-10-CM | POA: Diagnosis not present

## 2014-08-30 DIAGNOSIS — I359 Nonrheumatic aortic valve disorder, unspecified: Secondary | ICD-10-CM

## 2014-08-30 DIAGNOSIS — R002 Palpitations: Secondary | ICD-10-CM

## 2014-08-30 DIAGNOSIS — E785 Hyperlipidemia, unspecified: Secondary | ICD-10-CM

## 2014-08-30 NOTE — Patient Instructions (Signed)
SCHEDULE IN OCT 2016-----Your physician has requested that you have an echocardiogram. Echocardiography is a painless test that uses sound waves to create images of your heart. It provides your doctor with information about the size and shape of your heart and how well your heart's chambers and valves are working. This procedure takes approximately one hour. There are no restrictions for this procedure.   FOR SWELLING--- YOU MAY USE COMPRESSION STOCKINGS OVER THE COUNTER ,ASK PHARMACIST OR YOU MAY PURCHASE AT DEPT STORE.   Your physician wants you to follow-up in [place]. You will receive a reminder letter in the mail two months in advance. If you don't receive a letter, please call our office to schedule the follow-up appointment.

## 2014-08-30 NOTE — Progress Notes (Signed)
PCP: Annye Asa, MD  Clinic Note: Chief Complaint  Patient presents with  . Annual Exam    chest tightness last night, right leg swelling    HPI: Lisa Miles is a 64 y.o. female with a Cardiovascular Problem List below who presents today for 9 month followup. She has had an echocardiogram done in October, with a plan for her to be in the spring. It showed mild lateral prolapse with moderate regurgitation moderate left atrial dilation. Relatively stable. Also mild aortic stenosis. EF of 60-65%.  Interval History: She presents today without any major complaints.   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  She is tired all the time after having to care for her mother. Her mother is currently in rehabilitation, but is still taking quite a bit of her time. Because of that she is unable to the exercise she would like to.  Trying to get help @ home to be able to cover 24 hr.  Under lots of stress.  Had brief episode of CP last PM - ~10 min while lying in bed.  Went to ER a few months ago - negative work-up -- occurred working physical labor    Cardiovascular ROS: positive for - chest pain, edema and chest tightness - lasted ~1 hr. R>L LE by end of day negative for - dyspnea on exertion, irregular heartbeat, loss of consciousness, murmur, orthopnea, palpitations, paroxysmal nocturnal dyspnea, rapid heart rate, shortness of breath or TIA/amaourosis fugax, syncope / near syncope   Past Medical History  Diagnosis Date  . Heart disease   . Aortic stenosis, mild 07/2012    TEE  -- mild to moderate aortic stenosis which is a little more significant than the mild stenosis & moderate regurgitation which is stable. MILD mitral valve prolapse w/moderate regurgitation but no stenosis. Mild elevated pulmonary pressures were noted w/moderate tricuspid regurgitation. EF = 55-60% with grade 1 diastolic dysfunction.  . Mitral regurgitation due to cusp prolapse 07/2012   TEE - mild to mod AS (~> mild) & mod AI -- stable. MILD MVP with Mod MR & no stenosis. Mild elevated PAP w/mod TR. EF = 55-60%, G1 DD;; Followup echo October 2014: EF 60-65%., Gr 1 DD, mild AS (peak/mean 34/20 mmHg), Mod MR w/ mild AMVL Prolapse  . Hyperlipidemia   . Lower extremity edema     Chronic. Venous Duplex 11/06/11 SUMMARY: 1) Bilateral Lower Extremities: No evidence of DVT or thrombophlebitis.  2) Right Common Femoral Vein: Demonstrated mild valvular insufficiency with a greater than (1) sec of duration. Mildly abnormal LE Venous duplex Doppler.  . Hypertension     Good control  . Palpitations     Relatively well controlled  . Obesity   . Bronchitis, chronic   . Asthma   . Inguinodynia     Bilateral groin pain  . Bilateral bunions     Bunionectomies performed  . Scoliosis     DG Chest 2 View x-ray on 07/30/11 by Dr. Everlene Farrier shows a scoliosis.   PSH - reviewed in Epic  History  Substance Use Topics  . Smoking status: Never Smoker   . Smokeless tobacco: Never Used  . Alcohol Use: Yes     Comment: One bottle of wine at Christmas   Family History  Problem Relation Age of Onset  . Arthritis Mother   . Hyperlipidemia Mother   . Diabetes Mother   . Gout Mother   . Hypertension Mother   .  Cancer Father     GI cancer  . Diabetes Maternal Grandfather   . Hypertension Brother   . Diabetes Brother   . Hypertension Brother     ROS: A comprehensive Review of Systems - was performed Review of Systems  Constitutional: Positive for malaise/fatigue (always low Energy).  Eyes:       Thinks vision is a bit worse  Respiratory: Negative for cough and wheezing.   Cardiovascular: Positive for chest pain and leg swelling. Negative for claudication.       Per HPI  Gastrointestinal: Negative for heartburn, blood in stool and melena.  Genitourinary: Negative for hematuria.  Neurological: Positive for dizziness (positional) and headaches (HA today - ? may have a cold coming on; No Allergy  issues).  Psychiatric/Behavioral:       Social stress as noted  All other systems reviewed and are negative.  Wt Readings from Last 3 Encounters:  08/30/14 99.746 kg (219 lb 14.4 oz)  05/23/14 99.451 kg (219 lb 4 oz)  03/29/14 102.059 kg (225 lb)    PHYSICAL EXAM BP 100/60 mmHg  Pulse 67  Ht 5\' 6"  (1.676 m)  Wt 99.746 kg (219 lb 14.4 oz)  BMI 35.51 kg/m2 General appearance: Very pleasant, healthy-appearing Afro-American woman. No acute distress, alert and oriented x3.  Well-nourished and well-groomed. Psych: Answers questions appropriately.  Normal mood and affect .  HEENT: [place]/AT, EOMI, MMM, anicteric sclera Neck: no adenopathy, no carotid bruit, no JVD and supple, symmetrical, trachea midline  Lungs: clear to auscultation bilaterally, normal percussion bilaterally and Nonlabored, good air movement.  Heart: normal apical impulse, regular rate and rhythm, S1, S2 normal, no S3 or S4, systolic murmur: systolic ejection 2/6, medium pitch, crescendo, decrescendo and Mid peaking radiates to carotids, 2nd systolic murmur: holosystolic 2/6, blowing at lower left sternal border, at apex, radiates to axilla, no click, no rub and No clear diastolic murmur heard  Abdomen: soft, non-tender; bowel sounds normal; no masses, no organomegaly  Extremities: edema 1-2+ on the right, trace to 1+ on the left. Not currently wearing compression stockings., varicose veins noted and venous stasis dermatitis noted  Pulses: 2+ and symmetric  Neurologic: Grossly normal    Adult ECG Report  Rate: 67;  Rhythm: normal sinus rhythm and normal axis, intervals, durations -- Normal EKG  Recent Labs December Jan 2016: Lab Results  Component Value Date   CHOL 188 05/23/2014   HDL 56.60 05/23/2014   LDLCALC 109* 05/23/2014   TRIG 114.0 05/23/2014   CHOLHDL 3 05/23/2014   Lab Results  Component Value Date   CREATININE 0.76 05/23/2014    Other chemistries & CBC normal, Normal TSH.   ASSESSMENT /  PLAN: Problem List Items Addressed This Visit    Aortic valve disorder (Chronic)    Mild to moderate aortic stenosis 2 years ago. There is also moderate aortic regurgitation with associated mitral valve disease. I can hear stenosis murmur but not necessarily the regurgitation murmur.  Plan: Follow-up echocardiogram to reassess severity of stenosis and regurgitation.      Relevant Orders   EKG 12-Lead (Completed)   Echocardiogram   Chest pain at rest   Dyslipidemia (Chronic)    Not currently on a statin. Labs monitored by PCP. LDLs 109 and HDL 56. Continue lifestyle modification      Essential hypertension (Chronic)    Stable on current medications. Try to avoid hypotension.      Heart palpitations    Relatively asymptomatic at this point.  Lower extremity edema (Chronic)    Related to mild basilar insufficiency. Recommendation is support stockings, elevating legs routinely throughout the day.      Relevant Orders   Echocardiogram   Mitral regurgitation due to cusp prolapse - moderate MR with mild prolapse - Primary (Chronic)    As per plan from 2015, will check echocardiogram to follow-up valve disease. She has fatigue, but no significant heart failure symptoms of dyspnea, PND or orthopnea. Also significant palpitations with mitral prolapse. Continue afterload reduction with ACE inhibitor.      Relevant Orders   EKG 12-Lead (Completed)   Echocardiogram   Obesity (BMI 30-39.9)    Th e patient understands the need to lose weight with diet and exercise. We have discussed specific strategies for this.  I encouraged her having lost 3 pounds since December           Followup: 1 yr  Leonie Man, M.D., M.S. Interventional Cardiologist   Pager # [phone]

## 2014-08-31 DIAGNOSIS — R079 Chest pain, unspecified: Secondary | ICD-10-CM | POA: Insufficient documentation

## 2014-08-31 NOTE — Assessment & Plan Note (Signed)
As per plan from 2015, will check echocardiogram to follow-up valve disease. She has fatigue, but no significant heart failure symptoms of dyspnea, PND or orthopnea. Also significant palpitations with mitral prolapse. Continue afterload reduction with ACE inhibitor.

## 2014-08-31 NOTE — Assessment & Plan Note (Signed)
Mild to moderate aortic stenosis 2 years ago. There is also moderate aortic regurgitation with associated mitral valve disease. I can hear stenosis murmur but not necessarily the regurgitation murmur.  Plan: Follow-up echocardiogram to reassess severity of stenosis and regurgitation.

## 2014-08-31 NOTE — Assessment & Plan Note (Signed)
Related to mild basilar insufficiency. Recommendation is support stockings, elevating legs routinely throughout the day.

## 2014-08-31 NOTE — Assessment & Plan Note (Signed)
Th e patient understands the need to lose weight with diet and exercise. We have discussed specific strategies for this.  I encouraged her having lost 3 pounds since December

## 2014-08-31 NOTE — Assessment & Plan Note (Signed)
Relatively asymptomatic at this point.

## 2014-08-31 NOTE — Assessment & Plan Note (Signed)
Not currently on a statin. Labs monitored by PCP. LDLs 109 and HDL 56. Continue lifestyle modification

## 2014-08-31 NOTE — Assessment & Plan Note (Signed)
Stable on current medications. Try to avoid hypotension.

## 2014-09-17 ENCOUNTER — Encounter: Payer: Self-pay | Admitting: Family Medicine

## 2014-10-17 ENCOUNTER — Emergency Department (HOSPITAL_BASED_OUTPATIENT_CLINIC_OR_DEPARTMENT_OTHER)
Admission: EM | Admit: 2014-10-17 | Discharge: 2014-10-17 | Disposition: A | Discharge status: Home / Self Care | Payer: BLUE CROSS/BLUE SHIELD | Attending: Emergency Medicine | Admitting: Emergency Medicine

## 2014-10-17 ENCOUNTER — Emergency Department (HOSPITAL_BASED_OUTPATIENT_CLINIC_OR_DEPARTMENT_OTHER): Payer: BLUE CROSS/BLUE SHIELD

## 2014-10-17 ENCOUNTER — Encounter (HOSPITAL_BASED_OUTPATIENT_CLINIC_OR_DEPARTMENT_OTHER): Payer: Self-pay | Admitting: *Deleted

## 2014-10-17 DIAGNOSIS — E669 Obesity, unspecified: Secondary | ICD-10-CM | POA: Insufficient documentation

## 2014-10-17 DIAGNOSIS — J988 Other specified respiratory disorders: Secondary | ICD-10-CM | POA: Diagnosis not present

## 2014-10-17 DIAGNOSIS — M419 Scoliosis, unspecified: Secondary | ICD-10-CM | POA: Insufficient documentation

## 2014-10-17 DIAGNOSIS — J45909 Unspecified asthma, uncomplicated: Secondary | ICD-10-CM | POA: Insufficient documentation

## 2014-10-17 DIAGNOSIS — Z792 Long term (current) use of antibiotics: Secondary | ICD-10-CM | POA: Insufficient documentation

## 2014-10-17 DIAGNOSIS — R05 Cough: Secondary | ICD-10-CM | POA: Diagnosis present

## 2014-10-17 DIAGNOSIS — Z7982 Long term (current) use of aspirin: Secondary | ICD-10-CM | POA: Insufficient documentation

## 2014-10-17 DIAGNOSIS — I10 Essential (primary) hypertension: Secondary | ICD-10-CM | POA: Insufficient documentation

## 2014-10-17 DIAGNOSIS — Z79899 Other long term (current) drug therapy: Secondary | ICD-10-CM | POA: Insufficient documentation

## 2014-10-17 DIAGNOSIS — Z791 Long term (current) use of non-steroidal anti-inflammatories (NSAID): Secondary | ICD-10-CM | POA: Insufficient documentation

## 2014-10-17 DIAGNOSIS — R220 Localized swelling, mass and lump, head: Secondary | ICD-10-CM | POA: Insufficient documentation

## 2014-10-17 DIAGNOSIS — E785 Hyperlipidemia, unspecified: Secondary | ICD-10-CM | POA: Diagnosis not present

## 2014-10-17 DIAGNOSIS — J209 Acute bronchitis, unspecified: Secondary | ICD-10-CM

## 2014-10-17 DIAGNOSIS — J208 Acute bronchitis due to other specified organisms: Secondary | ICD-10-CM

## 2014-10-17 MED ORDER — HYDROCOD POLST-CPM POLST ER 10-8 MG/5ML PO SUER: 5.0000 mL | Freq: Two times a day (BID) | ORAL | Status: DC | PRN

## 2014-10-17 MED ORDER — ALBUTEROL SULFATE HFA 108 (90 BASE) MCG/ACT IN AERS
2.0000 | INHALATION_SPRAY | RESPIRATORY_TRACT | Status: DC | PRN
  Administered 2014-10-17: 2 via RESPIRATORY_TRACT
  Filled 2014-10-17: qty 6.7

## 2014-10-17 MED ORDER — AZITHROMYCIN 250 MG PO TABS: ORAL_TABLET | ORAL | Status: DC

## 2014-10-17 NOTE — Discharge Instructions (Signed)

## 2014-10-17 NOTE — ED Notes (Signed)
Pt. Reports cough started on Friday.  She reports coughing up yellow mucus. Pt. Reports sore throat.

## 2014-10-17 NOTE — ED Provider Notes (Signed)
CSN: [id number]     Arrival date & time 10/17/14  0015 History   First MD Initiated Contact with Patient 10/17/14 [phone]     Chief Complaint  Patient presents with  . Cough     (Consider location/radiation/quality/duration/timing/severity/associated sxs/prior Treatment) HPI This is a 64 year old female with a four-day history of sore throat and cough productive of yellow sputum. The cough is severe enough at times it causes urinary incontinence. It occurs in paroxysms. She denies fever. She has not had significant nasal congestion or rhinorrhea. She has noticed a tender lump in her right cheek. She is not sure if this lump is related to her other symptoms.  Past Medical History  Diagnosis Date  . Heart disease   . Aortic stenosis, mild 07/2012    TEE  -- mild to moderate aortic stenosis which is a little more significant than the mild stenosis & moderate regurgitation which is stable. MILD mitral valve prolapse w/moderate regurgitation but no stenosis. Mild elevated pulmonary pressures were noted w/moderate tricuspid regurgitation. EF = 55-60% with grade 1 diastolic dysfunction.  . Mitral regurgitation due to cusp prolapse 07/2012    TEE - mild to mod AS (~> mild) & mod AI -- stable. MILD MVP with Mod MR & no stenosis. Mild elevated PAP w/mod TR. EF = 55-60%, G1 DD;; Followup echo October 2014: EF 60-65%., Gr 1 DD, mild AS (peak/mean 34/20 mmHg), Mod MR w/ mild AMVL Prolapse  . Hyperlipidemia   . Lower extremity edema     Chronic. Venous Duplex 11/06/11 SUMMARY: 1) Bilateral Lower Extremities: No evidence of DVT or thrombophlebitis.  2) Right Common Femoral Vein: Demonstrated mild valvular insufficiency with a greater than (1) sec of duration. Mildly abnormal LE Venous duplex Doppler.  . Hypertension     Good control  . Palpitations     Relatively well controlled  . Obesity   . Bronchitis, chronic   . Asthma   . Inguinodynia     Bilateral groin pain  . Bilateral bunions     Bunionectomies  performed  . Scoliosis     DG Chest 2 View x-ray on 07/30/11 by Dr. Everlene Farrier shows a scoliosis.   Past Surgical History  Procedure Laterality Date  . Bunionectomy Bilateral   . Knee arthroscopy Right   . Abdominal hysterectomy    . Ankle arthroscopy Left   . Left knee open surgery    . Total abdominal hysterectomy     Family History  Problem Relation Age of Onset  . Arthritis Mother   . Hyperlipidemia Mother   . Diabetes Mother   . Gout Mother   . Hypertension Mother   . Cancer Father     GI cancer  . Diabetes Maternal Grandfather   . Hypertension Brother   . Diabetes Brother   . Hypertension Brother    History  Substance Use Topics  . Smoking status: Never Smoker   . Smokeless tobacco: Never Used  . Alcohol Use: Yes     Comment: One bottle of wine at Christmas   OB History    No data available     Review of Systems  All other systems reviewed and are negative.   Allergies  Review of patient's allergies indicates no known allergies.  Home Medications   Prior to Admission medications   Medication Sig Start Date End Date Taking? Authorizing Provider  amoxicillin (AMOXIL) 500 MG capsule Take 500 mg by mouth 3 (three) times daily. Take before going to the dentist  Historical Provider, MD  aspirin EC 81 MG tablet Take 81 mg by mouth daily.    Historical Provider, MD  calcium carbonate (OS-CAL) 600 MG TABS Take 600 mg by mouth 2 (two) times daily with a meal.    Historical Provider, MD  Cholecalciferol (VITAMIN D-3) 1000 UNITS CAPS Take 1 capsule by mouth daily.    Historical Provider, MD  clotrimazole-betamethasone (LOTRISONE) cream Apply 1 application topically 2 (two) times daily. 05/23/14   Midge Minium, MD  estradiol (ESTRACE) 2 MG tablet Take 2 mg by mouth daily.    Historical Provider, MD  hydrochlorothiazide (HYDRODIURIL) 25 MG tablet TAKE 1 TABLET DAILY 07/23/14   Leonie Man, MD  lisinopril (PRINIVIL,ZESTRIL) 20 MG tablet Take 20 mg by mouth daily.     Historical Provider, MD  meloxicam (MOBIC) 15 MG tablet Take 1 tablet (15 mg total) by mouth daily. 06/28/14   Midge Minium, MD  traMADol (ULTRAM) 50 MG tablet Take 50 mg by mouth every 6 (six) hours as needed.    Historical Provider, MD   BP 129/69 mmHg  Pulse 95  Temp(Src) 99 F (37.2 C) (Oral)  Resp 20  Ht 5\' 6"  (1.676 m)  Wt 219 lb (99.338 kg)  BMI 35.36 kg/m2  SpO2 97%   Physical Exam  General: Well-developed, well-nourished female in no acute distress; appearance consistent with age of record HENT: normocephalic; atraumatic; TMs normal; no nasal congestion; pharyngeal erythema without exudate; firm, rubbery, mildly tender mass in right lower cheek Eyes: pupils equal, round and reactive to light; extraocular muscles intact Neck: supple Heart: regular rate and rhythm Lungs: clear to auscultation bilaterally; frequent cough Abdomen: soft; nondistended; nontender Extremities: No deformity; full range of motion Neurologic: Awake, alert and oriented; motor function intact in all extremities and symmetric; no facial droop Skin: Warm and dry Psychiatric: Normal mood and affect    ED Course  Procedures (including critical care time)   MDM  Nursing notes and vitals signs, including pulse oximetry, reviewed.  Summary of this visit's results, reviewed by myself:  Imaging Studies: Dg Chest 2 View  10/17/2014   CLINICAL DATA:  Patient with cough and congestion.  EXAM: CHEST  2 VIEW  COMPARISON:  Chest radiograph 03/29/2014  FINDINGS: Stable cardiac and mediastinal contours. Mild tortuosity of the thoracic aorta. No consolidative pulmonary opacities. Biapical pleural parenchymal thickening. Mid thoracic spine degenerative changes.  IMPRESSION: No acute cardiopulmonary process.   Electronically Signed   By: Lovey Newcomer M.D.   On: 10/17/2014 01:44   The nodule in the patient's right cheek is not at the location of her parotid duct and it is an unknown likely location for lymph  node. It is unclear what this is and she was advised to follow-up with her dentist for further evaluation.    Shanon Rosser, MD 10/17/14 [phone]

## 2014-10-22 ENCOUNTER — Telehealth: Payer: Self-pay | Admitting: *Deleted

## 2014-10-22 NOTE — Telephone Encounter (Signed)
Patient spoke with [facility] 10/20/14 at 8:32 PM.  Call states:   "Caller states she was given hydrocodone cough syrup and believes it is not working only keeping her drugged up.  She has Robitussin DM maximum strength which seems to work better and asking if she can use this"    RN spoke with Dr. Regis Bill who stated it was OK for patient to use Robitussin DM.

## 2014-10-28 ENCOUNTER — Other Ambulatory Visit (HOSPITAL_COMMUNITY): Payer: Self-pay | Admitting: Cardiology

## 2014-10-30 NOTE — Telephone Encounter (Signed)
Rx(s) sent to pharmacy electronically.  

## 2014-11-04 ENCOUNTER — Other Ambulatory Visit (HOSPITAL_COMMUNITY): Payer: Self-pay | Admitting: Cardiology

## 2014-11-14 ENCOUNTER — Telehealth (HOSPITAL_COMMUNITY): Payer: Self-pay | Admitting: *Deleted

## 2014-11-21 ENCOUNTER — Ambulatory Visit: Payer: BLUE CROSS/BLUE SHIELD | Admitting: Family Medicine

## 2015-01-30 ENCOUNTER — Ambulatory Visit (HOSPITAL_COMMUNITY): Payer: BLUE CROSS/BLUE SHIELD | Attending: Cardiology

## 2015-01-30 ENCOUNTER — Other Ambulatory Visit: Payer: Self-pay

## 2015-01-30 DIAGNOSIS — I071 Rheumatic tricuspid insufficiency: Secondary | ICD-10-CM | POA: Diagnosis not present

## 2015-01-30 DIAGNOSIS — I359 Nonrheumatic aortic valve disorder, unspecified: Secondary | ICD-10-CM

## 2015-01-30 DIAGNOSIS — R600 Localized edema: Secondary | ICD-10-CM

## 2015-01-30 DIAGNOSIS — I5189 Other ill-defined heart diseases: Secondary | ICD-10-CM | POA: Insufficient documentation

## 2015-01-30 DIAGNOSIS — I059 Rheumatic mitral valve disease, unspecified: Secondary | ICD-10-CM | POA: Diagnosis present

## 2015-01-30 DIAGNOSIS — I341 Nonrheumatic mitral (valve) prolapse: Secondary | ICD-10-CM

## 2015-01-30 DIAGNOSIS — I517 Cardiomegaly: Secondary | ICD-10-CM | POA: Diagnosis not present

## 2015-01-30 DIAGNOSIS — I352 Nonrheumatic aortic (valve) stenosis with insufficiency: Secondary | ICD-10-CM | POA: Diagnosis not present

## 2015-01-30 DIAGNOSIS — I340 Nonrheumatic mitral (valve) insufficiency: Secondary | ICD-10-CM | POA: Diagnosis not present

## 2015-02-01 ENCOUNTER — Telehealth: Payer: Self-pay | Admitting: *Deleted

## 2015-02-01 DIAGNOSIS — I340 Nonrheumatic mitral (valve) insufficiency: Secondary | ICD-10-CM

## 2015-02-01 DIAGNOSIS — I359 Nonrheumatic aortic valve disorder, unspecified: Secondary | ICD-10-CM

## 2015-02-01 NOTE — Telephone Encounter (Signed)
Spoke to patient. Result given . Verbalized understanding ORDER IN OCT 2017

## 2015-02-01 NOTE — Telephone Encounter (Signed)
Left message to call back  

## 2015-02-01 NOTE — Telephone Encounter (Signed)
-----   Message from Leonie Man, MD sent at 01/31/2015  6:56 PM EDT ----- Overall the echocardiogram looks pretty much the same as it has. The mitral valve continues to have moderate regurgitation. Aortic stenosis is still about mild with some mild/moderate regurgitation.  We may want to check another echo in a year to reassess. Otherwise we'll do a baseline symptoms.  Normal ejection fraction. None of the valve disease is worse than moderate  DH

## 2015-02-21 ENCOUNTER — Telehealth: Payer: Self-pay | Admitting: Cardiology

## 2015-02-21 NOTE — Telephone Encounter (Signed)
PATIENT WOULD LIKE RESULT OF ECHO AGAIN  SHE STATES SHE HAD OTHER THINGS ON HER MIND- MOTHER WAS ILL. RESULT GIVEN. NEXT APPOINTMENT WILL BE 5 /2016. PATIENT AWARE.

## 2015-04-29 ENCOUNTER — Other Ambulatory Visit: Payer: Self-pay | Admitting: Family Medicine

## 2015-04-30 NOTE — Telephone Encounter (Signed)
Medication filled to pharmacy as requested.   

## 2015-07-28 ENCOUNTER — Other Ambulatory Visit (HOSPITAL_COMMUNITY): Payer: Self-pay | Admitting: Cardiology

## 2015-08-20 ENCOUNTER — Ambulatory Visit (INDEPENDENT_AMBULATORY_CARE_PROVIDER_SITE_OTHER): Payer: BLUE CROSS/BLUE SHIELD | Admitting: Family Medicine

## 2015-08-20 VITALS — BP 110/72 | HR 85 | Temp 98.5°F | Resp 18 | Ht 65.0 in | Wt 219.0 lb

## 2015-08-20 DIAGNOSIS — Z780 Asymptomatic menopausal state: Secondary | ICD-10-CM | POA: Insufficient documentation

## 2015-08-20 DIAGNOSIS — J069 Acute upper respiratory infection, unspecified: Secondary | ICD-10-CM

## 2015-08-20 DIAGNOSIS — R05 Cough: Secondary | ICD-10-CM | POA: Diagnosis not present

## 2015-08-20 DIAGNOSIS — R059 Cough, unspecified: Secondary | ICD-10-CM

## 2015-08-20 MED ORDER — BENZONATATE 100 MG PO CAPS: 100.0000 mg | ORAL_CAPSULE | Freq: Three times a day (TID) | ORAL | Status: DC | PRN

## 2015-08-20 MED ORDER — AZITHROMYCIN 250 MG PO TABS: ORAL_TABLET | ORAL | Status: DC

## 2015-08-20 NOTE — Patient Instructions (Addendum)
     IF you received an x-ray today, you will receive an invoice from [facility]. Please contact [facility] at [phone] with questions or concerns regarding your invoice.   IF you received labwork today, you will receive an invoice from [facility]. Please contact [facility] at [phone] with questions or concerns regarding your invoice.   Our billing staff will not be able to assist you with questions regarding bills from these companies.  You will be contacted with the lab results as soon as they are available. The fastest way to get your results is to activate your My Chart account. Instructions are located on the last page of this paperwork. If you have not heard from us regarding the results in 2 weeks, please contact this office.      Cough, Adult Coughing is a reflex that clears your throat and your airways. Coughing helps to heal and protect your lungs. It is normal to cough occasionally, but a cough that happens with other symptoms or lasts a long time may be a sign of a condition that needs treatment. A cough may last only 2-3 weeks (acute), or it may last longer than 8 weeks (chronic). CAUSES Coughing is commonly caused by:  Breathing in substances that irritate your lungs.  A viral or bacterial respiratory infection.  Allergies.  Asthma.  Postnasal drip.  Smoking.  Acid backing up from the stomach into the esophagus (gastroesophageal reflux).  Certain medicines.  Chronic lung problems, including COPD (or rarely, lung cancer).  Other medical conditions such as heart failure. HOME CARE INSTRUCTIONS  Pay attention to any changes in your symptoms. Take these actions to help with your discomfort:  Take medicines only as told by your health care provider.  If you were prescribed an antibiotic medicine, take it as told by your health care provider. Do not stop taking the antibiotic even if you start to feel  better.  Talk with your health care provider before you take a cough suppressant medicine.  Drink enough fluid to keep your urine clear or pale yellow.  If the air is dry, use a cold steam vaporizer or humidifier in your bedroom or your home to help loosen secretions.  Avoid anything that causes you to cough at work or at home.  If your cough is worse at night, try sleeping in a semi-upright position.  Avoid cigarette smoke. If you smoke, quit smoking. If you need help quitting, ask your health care provider.  Avoid caffeine.  Avoid alcohol.  Rest as needed. SEEK MEDICAL CARE IF:   You have new symptoms.  You cough up pus.  Your cough does not get better after 2-3 weeks, or your cough gets worse.  You cannot control your cough with suppressant medicines and you are losing sleep.  You develop pain that is getting worse or pain that is not controlled with pain medicines.  You have a fever.  You have unexplained weight loss.  You have night sweats. SEEK IMMEDIATE MEDICAL CARE IF:  You cough up blood.  You have difficulty breathing.  Your heartbeat is very fast.   This information is not intended to replace advice given to you by your health care provider. Make sure you discuss any questions you have with your health care provider.   Document Released: 10/10/2010 Document Revised: 01/02/2015 Document Reviewed: 06/20/2014 Elsevier Interactive Patient Education 2016 [employer]  

## 2015-08-20 NOTE — Progress Notes (Signed)
Subjective:    Patient ID: Lisa Miles, female    DOB: [birth year], 65 y.o.   MRN: [id number]  HPI This is a 65 yo female who presents today with 2 weeks of cough that is mostly clear and has some streaks of blood. Clear nasal drainage. No sore throat or ear pain. SOB with cough. Has tried Mucinex DM, [facility] cold and flu, Equate Severe Congestion with some relief. No chest pain, no fever, sleeping ok. She gets pneumonia or bronchitis every year and requests a Zpack which she states works well for her. She is going out of town in 3 days and is concerned that she will not be feeling well for travel. She has heard some wheezing and has been given an inhaler in the past but she reports that she never used it.     Past Medical History  Diagnosis Date  . Heart disease   . Aortic stenosis, mild 07/2012    TEE  -- mild to moderate aortic stenosis which is a little more significant than the mild stenosis & moderate regurgitation which is stable. MILD mitral valve prolapse w/moderate regurgitation but no stenosis. Mild elevated pulmonary pressures were noted w/moderate tricuspid regurgitation. EF = 55-60% with grade 1 diastolic dysfunction.  . Mitral regurgitation due to cusp prolapse 07/2012    TEE - mild to mod AS (~> mild) & mod AI -- stable. MILD MVP with Mod MR & no stenosis. Mild elevated PAP w/mod TR. EF = 55-60%, G1 DD;; Followup echo October 2014: EF 60-65%., Gr 1 DD, mild AS (peak/mean 34/20 mmHg), Mod MR w/ mild AMVL Prolapse  . Hyperlipidemia   . Lower extremity edema     Chronic. Venous Duplex 11/06/11 SUMMARY: 1) Bilateral Lower Extremities: No evidence of DVT or thrombophlebitis.  2) Right Common Femoral Vein: Demonstrated mild valvular insufficiency with a greater than (1) sec of duration. Mildly abnormal LE Venous duplex Doppler.  . Hypertension     Good control  . Palpitations     Relatively well controlled  . Obesity   . Bronchitis, chronic ([place])   . Asthma   . Inguinodynia     Bilateral groin pain  . Bilateral bunions     Bunionectomies performed  . Scoliosis     DG Chest 2 View x-ray on 07/30/11 by Dr. Everlene Farrier shows a scoliosis.   Past Surgical History  Procedure Laterality Date  . Bunionectomy Bilateral   . Knee arthroscopy Right   . Abdominal hysterectomy    . Ankle arthroscopy Left   . Left knee open surgery    . Total abdominal hysterectomy     Family History  Problem Relation Age of Onset  . Arthritis Mother   . Hyperlipidemia Mother   . Diabetes Mother   . Gout Mother   . Hypertension Mother   . Cancer Father     GI cancer  . Diabetes Maternal Grandfather   . Hypertension Brother   . Diabetes Brother   . Hypertension Brother    Social History  Substance Use Topics  . Smoking status: Never Smoker   . Smokeless tobacco: Never Used  . Alcohol Use: Yes     Comment: One bottle of wine at Christmas    Review of Systems  Constitutional: Negative for fever and fatigue.  HENT: Positive for congestion, postnasal drip and rhinorrhea. Negative for ear pain, sinus pressure and sore throat.   Respiratory: Positive for cough, shortness of breath (with cough) and wheezing. Negative for  chest tightness.   Cardiovascular: Negative for chest pain, palpitations and leg swelling.  Neurological: Negative for headaches.      Objective:   Physical Exam  Constitutional: She is oriented to [place], place, and time. She appears well-developed and well-nourished. No distress.  HENT:  Head: Normocephalic and atraumatic.  Right Ear: Tympanic membrane, external ear and ear canal normal.  Left Ear: Tympanic membrane, external ear and ear canal normal.  Nose: Mucosal edema and rhinorrhea present.  Mouth/Throat: Uvula is midline. No posterior oropharyngeal edema or posterior oropharyngeal erythema.  Moderate amount post nasal drainage  Cardiovascular: Normal rate and regular rhythm.   Murmur (2/6 holisystolic) heard. Pulmonary/Chest: Effort normal. No respiratory  distress. She has wheezes (few uppera anterior expiratory wheezes that cleared with coughing.).  Musculoskeletal: Normal range of motion.  Neurological: She is alert and oriented to [place], place, and time.  Skin: Skin is warm and dry. She is not diaphoretic.  Psychiatric: She has a normal mood and affect. Her behavior is normal. Judgment and thought content normal.  Vitals reviewed.     BP 110/72 mmHg  Pulse 85  Temp(Src) 98.5 F (36.9 C)  Resp 18  Ht 5\' 5"  (1.651 m)  Wt 219 lb (99.338 kg)  BMI 36.44 kg/m2  SpO2 94% Wt Readings from Last 3 Encounters:  08/20/15 219 lb (99.338 kg)  10/17/14 219 lb (99.338 kg)  08/30/14 219 lb 14.4 oz (99.746 kg)       Assessment & Plan:  1. Upper respiratory infection - azithromycin (ZITHROMAX) 250 MG tablet; Take 2 tablets today then one a day until finished  Dispense: 6 tablet; Refill: 0 - increase fluids - RTC precautions reviewed  2. Cough - benzonatate (TESSALON) 100 MG capsule; Take 1-2 capsules (100-200 mg total) by mouth 3 (three) times daily as needed for cough.  Dispense: 40 capsule; Refill: 0   Clarene Reamer, FNP-BC  [facility] [place] [facility]  08/20/2015 8:51 AM

## 2015-09-06 ENCOUNTER — Ambulatory Visit: Payer: BLUE CROSS/BLUE SHIELD | Admitting: Cardiology

## 2015-09-26 ENCOUNTER — Encounter: Payer: Self-pay | Admitting: Cardiology

## 2015-09-26 ENCOUNTER — Ambulatory Visit (INDEPENDENT_AMBULATORY_CARE_PROVIDER_SITE_OTHER): Payer: BLUE CROSS/BLUE SHIELD | Admitting: Cardiology

## 2015-09-26 VITALS — BP 104/64 | HR 62 | Ht 66.0 in | Wt 218.6 lb

## 2015-09-26 DIAGNOSIS — I10 Essential (primary) hypertension: Secondary | ICD-10-CM | POA: Diagnosis not present

## 2015-09-26 DIAGNOSIS — I359 Nonrheumatic aortic valve disorder, unspecified: Secondary | ICD-10-CM

## 2015-09-26 DIAGNOSIS — I340 Nonrheumatic mitral (valve) insufficiency: Secondary | ICD-10-CM | POA: Diagnosis not present

## 2015-09-26 DIAGNOSIS — R600 Localized edema: Secondary | ICD-10-CM

## 2015-09-26 DIAGNOSIS — E785 Hyperlipidemia, unspecified: Secondary | ICD-10-CM

## 2015-09-26 DIAGNOSIS — R002 Palpitations: Secondary | ICD-10-CM | POA: Diagnosis not present

## 2015-09-26 NOTE — Patient Instructions (Addendum)
NEED SCHEDULE ECHO FOR OCT 2017 ( ORDER IS IN RECALL)-Your physician has requested that you have an echocardiogram [place] [facility]. Echocardiography is a painless test that uses sound waves to create images of your heart. It provides your doctor with information about the size and shape of your heart and how well your heart's chambers and valves are working. This procedure takes approximately one hour. There are no restrictions for this procedure.   NO CHANGES WITH CURRENT MEDICATIONS   Your physician wants you to follow-up in [place].  You will receive a reminder letter in the mail two months in advance. If you don't receive a letter, please call our office to schedule the follow-up appointment.   If you need a refill on your cardiac medications before your next appointment, please call your pharmacy.

## 2015-09-26 NOTE — Progress Notes (Signed)
PCP: Annye Asa, MD  Clinic Note: Chief Complaint  Patient presents with  . Follow-up    Mitral valve prolapse with regurgitation    HPI: Lisa Miles is a 65 y.o. female with a PMH below who presents today for Annual follow-up for mild aortic stenosis and mitral regurgitation with MVP. She also has hypertension and hyperlipidemia and some palpitations with chronic lower extremity edema.  Lisa Miles was last seen in May 2016  Recent Hospitalizations: none  Studies Reviewed:   2-D echocardiogram 01/31/2015: Stable echocardiogram. EF 55-60%. GR 1 DD. Consistent moderate MR and mild AS and moderate AI. Increased PA pressures (45 mmHg)  Interval History: Lisa Miles presents today doing well overall from a cardiac standpoint. The only issue she has this she is dealing with a cold for the last couple months and has been coughing tremendously. She had a Z-Pak not that long ago. She denies any fever or chills or sweats associated with it, but still feels like she is coughing she is concerned about developing pneumonia.  From a cardiac standpoint she's been stable with no symptoms of chest tightness pressure with rest or exertion. No heart failure symptoms of PND, orthopnea -- but she does have some dependent edema.. No resting or exertional dyspnea except now that she's got cough she's been having some shortness of breath. Her palpitations are less noticeable she only feels them when she gets excited or lies on her chest at night.  Remainder of cardiovascular review of symptoms is as follows:  No lightheadedness, dizziness, weakness or syncope/near syncope.  No TIA/amaurosis fugax symptoms.  No claudication.  ROS: A comprehensive was performed. Review of Systems  Constitutional: Negative for fever and chills.  HENT: Negative for nosebleeds.   Respiratory: Positive for cough and wheezing. Negative for sputum production and shortness of breath.   Cardiovascular: Positive for  palpitations (when she lies on her chest).  Gastrointestinal: Negative for blood in stool and melena.  Genitourinary: Negative for hematuria.  Musculoskeletal: Negative for falls (tripped @ work & hit her knee - but now real falls).  Neurological: Negative for dizziness (occasional dizziness @ work), loss of consciousness and headaches.  Endo/Heme/Allergies: Does not bruise/bleed easily.  Psychiatric/Behavioral: Negative for depression and memory loss. The patient is not nervous/anxious and does not have insomnia.   All other systems reviewed and are negative.   Past Medical History  Diagnosis Date  . Heart disease   . Aortic stenosis, mild 07/2012    TEE  -- mild to moderate aortic stenosis which is a little more significant than the mild stenosis & moderate regurgitation which is stable. MILD mitral valve prolapse w/moderate regurgitation but no stenosis. Mild elevated pulmonary pressures were noted w/moderate tricuspid regurgitation. EF = 55-60% with grade 1 diastolic dysfunction.  . Mitral regurgitation due to cusp prolapse 07/2012    TEE - mild to mod AS (~> mild) & mod AI -- stable. MILD MVP with Mod MR & no stenosis. Mild elevated PAP w/mod TR. EF = 55-60%, G1 DD;; Followup echo October 2014: EF 60-65%., Gr 1 DD, mild AS (peak/mean 34/20 mmHg), Mod MR w/ mild AMVL Prolapse  . Hyperlipidemia   . Lower extremity edema     Chronic. Venous Duplex 11/06/11 SUMMARY: 1) Bilateral Lower Extremities: No evidence of DVT or thrombophlebitis.  2) Right Common Femoral Vein: Demonstrated mild valvular insufficiency with a greater than (1) sec of duration. Mildly abnormal LE Venous duplex Doppler.  . Hypertension  Good control  . Palpitations     Relatively well controlled  . Obesity   . Bronchitis, chronic ([place])   . Asthma   . Inguinodynia     Bilateral groin pain  . Bilateral bunions     Bunionectomies performed  . Scoliosis     DG Chest 2 View x-ray on 07/30/11 by Dr. Everlene Farrier shows a scoliosis.     Past Surgical History  Procedure Laterality Date  . Bunionectomy Bilateral   . Knee arthroscopy Right   . Abdominal hysterectomy    . Ankle arthroscopy Left   . Left knee open surgery    . Total abdominal hysterectomy      Prior to Admission medications   Medication Sig Start Date End Date Taking? Authorizing Provider  aspirin EC 81 MG tablet Take 81 mg by mouth daily.   Yes Historical Provider, MD  calcium carbonate (OS-CAL) 600 MG TABS Take 600 mg by mouth 2 (two) times daily with a meal.   Yes Historical Provider, MD  Cholecalciferol (VITAMIN D-3) 1000 UNITS CAPS Take 1 capsule by mouth daily.   Yes Historical Provider, MD  estradiol (ESTRACE) 2 MG tablet Take 2 mg by mouth daily.   Yes Historical Provider, MD  hydrochlorothiazide (HYDRODIURIL) 25 MG tablet Take 1 tablet (25 mg total) by mouth daily. Please make appointment. 07/30/15  Yes Leonie Man, MD  lisinopril (PRINIVIL,ZESTRIL) 20 MG tablet TAKE 1 TABLET DAILY 11/05/14  Yes Leonie Man, MD  traMADol (ULTRAM) 50 MG tablet Take 50 mg by mouth every 6 (six) hours as needed. Reported on 08/20/2015   Yes Historical Provider, MD   No Known Allergies   Social History   Social History  . Marital Status: Married    Spouse Name: Lisa Miles  . Number of Children: 2  . Years of Education: N/A   Occupational History  . production     check printing/shipping   Social History Main Topics  . Smoking status: Never Smoker   . Smokeless tobacco: Never Used  . Alcohol Use: Yes     Comment: One bottle of wine at Christmas  . Drug Use: No  . Sexual Activity: No   Other Topics Concern  . None   Social History Narrative   Lives with her husband.  Their children live nearby.   family history includes Arthritis in her mother; Cancer in her father; Diabetes in her brother, maternal grandfather, and mother; Gout in her mother; Hyperlipidemia in her mother; Hypertension in her brother, brother, and mother.   Wt Readings from  Last 3 Encounters:  09/26/15 218 lb 9.6 oz (99.156 kg)  08/20/15 219 lb (99.338 kg)  10/17/14 219 lb (99.338 kg)    PHYSICAL EXAM BP 104/64 mmHg  Pulse 62  Ht 5\' 6"  (1.676 m)  Wt 218 lb 9.6 oz (99.156 kg)  BMI 35.30 kg/m2  SpO2 98% General appearance: Very pleasant, healthy-appearing Afro-American woman. No acute distress, alert and oriented x3. Well-nourished and well-groomed. Psych: Answers questions appropriately. Normal mood and affect .  HEENT: [place]/AT, EOMI, MMM, anicteric sclera Neck: no adenopathy, no carotid bruit, no JVD and supple, symmetrical, trachea midline  Lungs: clear to auscultation bilaterally, normal percussion bilaterally and Nonlabored, good air movement.  Heart: normal apical impulse, regular rate and rhythm, S1, S2 normal, no S3 or S4, systolic murmur: systolic ejection 2/6, medium pitch, crescendo, decrescendo and Mid peaking radiates to carotids, 2nd systolic murmur: holosystolic 2/6, blowing at lower left sternal border, at apex,  radiates to axilla, no click, no rub and No clear diastolic murmur heard  Abdomen: soft, non-tender; bowel sounds normal; no masses, no organomegaly  Extremities: edema 1-2+ on the right, trace to 1+ on the left. Not currently wearing compression stockings., varicose veins noted and venous stasis dermatitis noted  Pulses: 2+ and symmetric  Neurologic: Grossly normal     Adult ECG Report  Rate: 62 ;  Rhythm: normal sinus rhythm and LVH with mild repolarization abnormality. Nonspecific ST and T-wave abnormality.; normal axis, intervals and durations.  Narrative Interpretation: Stable EKG   Other studies Reviewed: Additional studies/ records that were reviewed today include:  Recent Labs:   Lab Results  Component Value Date   CHOL 188 05/23/2014   HDL 56.60 05/23/2014   LDLCALC 109* 05/23/2014   TRIG 114.0 05/23/2014   CHOLHDL 3 05/23/2014    ASSESSMENT / PLAN: Problem List Items Addressed This Visit    Mitral  regurgitation due to cusp prolapse - moderate MR with mild prolapse - Primary (Chronic)    Last echo was in October 2016. She will be due for annual follow-up. Not currently having much in the way of any symptoms to suggest that the valve is worsened. She is on ACE inhibitor for afterload reduction. No heart failure symptoms.  I don't think that her current cough is related to pulmonary edema.      Relevant Orders   EKG 12-Lead   Lower extremity edema (Chronic)    Likely related to venous insufficiency. Again continue to recommend support stockings and feet elevation. Prefer not to use diuretic besides low-dose HCTZ.      Heart palpitations   Relevant Orders   EKG 12-Lead   Essential hypertension (Chronic)    Excellent control on current ACE inhibitor dose. Currently also on HCTZ. Need to avoid dehydration. Air blood pressures a little on the low side, we may potentially need to back off on the HCTZ.      Relevant Orders   EKG 12-Lead   Dyslipidemia (Chronic)    Labs are monitored by PCP. She is pretty much within target range for her risk factors. Not currently on a statin. Being control with lifestyle modification. She is probably due to have lipids rechecked this year however.      Aortic valve disorder (Chronic)    Mild aortic stenosis with moderate regurgitation by last echo. Due for follow-up. I still cannot hear significant diastolic/ regurgitant murmur in the aortic position  Plan: 2-D echocardiogram October         Current medicines are reviewed at length with the patient today. (+/- concerns)  The following changes have been made: NO CHANGES WITH CURRENT MEDICATIONS    Studies Ordered:  NEED SCHEDULE ECHO FOR OCT 2017 ( ORDER IS IN RECALL) Orders Placed This Encounter  Procedures  . EKG 12-Lead   ROV 12 MONTH WITH DR Lloyd Ayo.   Glenetta Hew, M.D., M.S. Interventional Cardiologist   Pager # [phone] Phone # [phone] [address] [place], [place] [address]

## 2015-09-28 ENCOUNTER — Encounter: Payer: Self-pay | Admitting: Cardiology

## 2015-09-28 NOTE — Assessment & Plan Note (Signed)
Excellent control on current ACE inhibitor dose. Currently also on HCTZ. Need to avoid dehydration. Air blood pressures a little on the low side, we may potentially need to back off on the HCTZ.

## 2015-09-28 NOTE — Assessment & Plan Note (Signed)
Last echo was in October 2016. She will be due for annual follow-up. Not currently having much in the way of any symptoms to suggest that the valve is worsened. She is on ACE inhibitor for afterload reduction. No heart failure symptoms.  I don't think that her current cough is related to pulmonary edema.

## 2015-09-28 NOTE — Assessment & Plan Note (Signed)
Labs are monitored by PCP. She is pretty much within target range for her risk factors. Not currently on a statin. Being control with lifestyle modification. She is probably due to have lipids rechecked this year however.

## 2015-09-28 NOTE — Assessment & Plan Note (Addendum)
Mild aortic stenosis with moderate regurgitation by last echo. Due for follow-up. I still cannot hear significant diastolic/ regurgitant murmur in the aortic position  Plan: 2-D echocardiogram October

## 2015-09-28 NOTE — Assessment & Plan Note (Signed)
Likely related to venous insufficiency. Again continue to recommend support stockings and feet elevation. Prefer not to use diuretic besides low-dose HCTZ.

## 2015-10-27 ENCOUNTER — Other Ambulatory Visit (HOSPITAL_COMMUNITY): Payer: Self-pay | Admitting: Cardiology

## 2015-11-03 ENCOUNTER — Other Ambulatory Visit (HOSPITAL_COMMUNITY): Payer: Self-pay | Admitting: Cardiology

## 2016-01-10 ENCOUNTER — Ambulatory Visit (INDEPENDENT_AMBULATORY_CARE_PROVIDER_SITE_OTHER): Payer: BLUE CROSS/BLUE SHIELD | Admitting: Physician Assistant

## 2016-01-10 VITALS — BP 132/78 | HR 73 | Temp 98.0°F | Resp 18 | Ht 65.0 in | Wt 214.8 lb

## 2016-01-10 DIAGNOSIS — Z0000 Encounter for general adult medical examination without abnormal findings: Secondary | ICD-10-CM

## 2016-01-10 DIAGNOSIS — M25511 Pain in right shoulder: Secondary | ICD-10-CM | POA: Diagnosis not present

## 2016-01-10 LAB — POCT CBC
Granulocyte percent: 49.6 %G (ref 37–80)
HCT, POC: 39.3 % (ref 37.7–47.9)
Hemoglobin: 13.6 g/dL (ref 12.2–16.2)
Lymph, poc: 1.7 (ref 0.6–3.4)
MCH, POC: 30.9 pg (ref 27–31.2)
MCHC: 34.7 g/dL (ref 31.8–35.4)
MCV: 89 fL (ref 80–97)
MID (cbc): 0.5 (ref 0–0.9)
MPV: 7 fL (ref 0–99.8)
POC Granulocyte: 2.1 (ref 2–6.9)
POC LYMPH PERCENT: 38.7 %L (ref 10–50)
POC MID %: 11.7 %M (ref 0–12)
Platelet Count, POC: 230 10*3/uL (ref 142–424)
RBC: 4.42 M/uL (ref 4.04–5.48)
RDW, POC: 12.4 %
WBC: 4.3 10*3/uL — AB (ref 4.6–10.2)

## 2016-01-10 LAB — LIPID PANEL
Cholesterol: 202 mg/dL — ABNORMAL HIGH (ref 125–200)
HDL: 70 mg/dL (ref >=46)
LDL Cholesterol: 105 mg/dL (ref <130)
Total CHOL/HDL Ratio: 2.9 Ratio (ref <=5.0)
Triglycerides: 137 mg/dL (ref <150)
VLDL: 27 mg/dL (ref <30)

## 2016-01-10 LAB — COMPLETE METABOLIC PANEL WITH GFR
ALT: 13 U/L (ref 6–29)
AST: 16 U/L (ref 10–35)
Albumin: 3.7 g/dL (ref 3.6–5.1)
Alkaline Phosphatase: 37 U/L (ref 33–130)
BUN: 15 mg/dL (ref 7–25)
CO2: 25 mmol/L (ref 20–31)
Calcium: 9.6 mg/dL (ref 8.6–10.4)
Chloride: 104 mmol/L (ref 98–110)
Creat: 0.85 mg/dL (ref 0.50–0.99)
GFR, Est African American: 84 mL/min (ref >=60)
GFR, Est Non African American: 73 mL/min (ref >=60)
Glucose, Bld: 92 mg/dL (ref 65–99)
Potassium: 4.1 mmol/L (ref 3.5–5.3)
Sodium: 139 mmol/L (ref 135–146)
Total Bilirubin: 0.3 mg/dL (ref 0.2–1.2)
Total Protein: 6.8 g/dL (ref 6.1–8.1)

## 2016-01-10 LAB — TSH: TSH: 3.47 mIU/L

## 2016-01-10 LAB — POCT GLYCOSYLATED HEMOGLOBIN (HGB A1C): Hemoglobin A1C: 5.3

## 2016-01-10 MED ORDER — TRAMADOL HCL 50 MG PO TABS: 50.0000 mg | ORAL_TABLET | Freq: Four times a day (QID) | ORAL | 0 refills | Status: DC | PRN

## 2016-01-10 NOTE — Patient Instructions (Signed)

## 2016-01-10 NOTE — Progress Notes (Signed)
[facility] [address] [place] [address] [phone]  Date:  01/10/2016   Name:  Lisa Miles   DOB:  09/21/1950   MRN:  [id number]  PCP:  Annye Asa, MD    Chief Complaint: Annual Exam and Medication Refill (Tramadol)   History of Present Illness:  This is a 65 y.o. female with PMH hypertension, Mitral regurgitation, aortic valve disorder, asthma, fungal skin infection of breast who is presenting for CPE.   Last CPE 04/2014.  Checks blood sugar at home every once in a while. She used to check her mother's, and she uses the strips to check her own every so often.  Mitral regurgitation and aortic valve disorder: Cardiologist - Dr. Ellyn Hack. Sees once a year. She has imaging scheduled next month. Takes amoxicillin before every dental appointment.   Hypertension: Controlled with HCTZ, Lisinopril   Complaints:  Back pain, Right shoulder pain x several years. Had MRI before. 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  Takes Tramadol every few weeks.   LMP: Menopause  Last pap: 04/2014 Immunizations: Declines Zostavax today Dentist: Sept 2017 Eyes: Wears glasses. [facility] [address]  Diet/Exercise: Stretches occasionally, ten minutes, 3-4 times a week. Walks at work for about three hours.  Fam hx: Diabetes, hypertension Tobacco/alcohol/substance use: No   Mammogram: UTD.  Colonoscopy: UTD.  Dexa: UTD, takes calcium.    Review of Systems:  Review of Systems  Respiratory: Negative.   Cardiovascular: Negative.   Gastrointestinal: Negative.   Musculoskeletal: Positive for arthralgias (right shoulder pain) and back pain. Negative for neck pain.  Skin: Negative.     Patient Active Problem List   Diagnosis Date Noted  . Menopause 08/20/2015  . Chest pain at rest 08/31/2014  . Fungal dermatitis 05/23/2014  . Heart palpitations 10/19/2013  . Thyromegaly 03/28/2013  . Routine general  medical examination at a health care facility 03/28/2013  . Obesity (BMI 30-39.9) 03/28/2013  . Lower extremity edema   . Inguinodynia 12/23/2012  . Essential hypertension 07/14/2012  . Bilateral groin pain 07/14/2012  . Dyslipidemia 05/30/2009  . Mitral regurgitation due to cusp prolapse - moderate MR with mild prolapse 05/30/2009  . Aortic valve disorder 05/30/2009  . ASTHMA UNSPECIFIED WITH EXACERBATION 04/02/2009    Prior to Admission medications   Medication Sig Start Date End Date Taking? Authorizing Provider  aspirin EC 81 MG tablet Take 81 mg by mouth daily.   Yes Historical Provider, MD  calcium carbonate (OS-CAL) 600 MG TABS Take 600 mg by mouth 2 (two) times daily with a meal.   Yes Historical Provider, MD  Cholecalciferol (VITAMIN D-3) 1000 UNITS CAPS Take 1 capsule by mouth daily.   Yes Historical Provider, MD  estradiol (ESTRACE) 2 MG tablet Take 2 mg by mouth daily.   Yes Historical Provider, MD  hydrochlorothiazide (HYDRODIURIL) 25 MG tablet TAKE 1 TABLET DAILY (PLEASE MAKE APPOINTMENT) 10/28/15  Yes Leonie Man, MD  lisinopril (PRINIVIL,ZESTRIL) 20 MG tablet Take 1 tablet (20 mg total) by mouth daily. 11/04/15  Yes Leonie Man, MD  traMADol (ULTRAM) 50 MG tablet Take 50 mg by mouth every 6 (six) hours as needed. Reported on 08/20/2015   Yes Historical Provider, MD    No Known Allergies  Past Surgical History:  Procedure Laterality Date  . ABDOMINAL HYSTERECTOMY    . ANKLE ARTHROSCOPY Left   . BUNIONECTOMY Bilateral   . KNEE ARTHROSCOPY Right   .  Left knee open surgery    . TOTAL ABDOMINAL HYSTERECTOMY      Social History  Substance Use Topics  . Smoking status: Never Smoker  . Smokeless tobacco: Never Used  . Alcohol use Yes     Comment: One bottle of wine at Christmas    Family History  Problem Relation Age of Onset  . Arthritis Mother   . Hyperlipidemia Mother   . Diabetes Mother   . Gout Mother   . Hypertension Mother   . Cancer Father     GI  cancer  . Diabetes Maternal Grandfather   . Hypertension Brother   . Diabetes Brother   . Hypertension Brother     Medication list has been reviewed and updated.  Physical Examination:  Physical Exam  Constitutional: She is oriented to [place], place, and time. Vital signs are normal. She appears well-developed and well-nourished. No distress.  HENT:  Head: Normocephalic and atraumatic.  Right Ear: External ear normal.  Left Ear: External ear normal.  Mouth/Throat: Oropharynx is clear and moist. No oropharyngeal exudate.  Eyes: Conjunctivae and EOM are normal. Pupils are equal, round, and reactive to light.  Cardiovascular: Normal rate, regular rhythm and intact distal pulses.   Murmur heard. Pulmonary/Chest: Effort normal and breath sounds normal. She has no wheezes.  Breast: No erythema, dimpling, or other skin changes noted. No lumps noted on breast exam. No lymphadenopathy to axillary nodes.   Abdominal: Soft. Bowel sounds are normal. She exhibits no distension and no mass. There is no tenderness.  Musculoskeletal: Normal range of motion. She exhibits tenderness (right shoulder).       Right shoulder: She exhibits tenderness (anterior shoulder). She exhibits normal range of motion, no bony tenderness, no swelling and normal strength.  Neurological: She is alert and oriented to [place], place, and time. She has normal reflexes.  Skin: Skin is warm and dry.  Psychiatric: She has a normal mood and affect. Her behavior is normal. Judgment and thought content normal.  Vitals reviewed.   BP 132/78 (BP Location: Left Arm, Patient Position: Sitting, Cuff Size: Large)   Pulse 73   Temp 98 F (36.7 C) (Oral)   Resp 18   Ht 5\' 5"  (1.651 m)   Wt 214 lb 12.8 oz (97.4 kg)   SpO2 99%   BMI 35.74 kg/m   Assessment and Plan: 1. Annual physical exam - Vital signs normal, normal physical exam. Encouraged patient to keep moving, exercising several times a week.  - Encouraged her to  consider Zostavax and Hep C screening at next appointment.  - POCT CBC - Lipid panel - COMPLETE METABOLIC PANEL WITH GFR - TSH - POCT glycosylated hemoglobin (Hb A1C)  2. Pain in joint of right shoulder - Discussed use of pain medication. She takes Tramadol every few weeks for shoulder pain. Refill today. Instructed patient on stretching, and shoulder exercises.

## 2016-01-16 ENCOUNTER — Encounter: Payer: Self-pay | Admitting: Physician Assistant

## 2016-01-16 NOTE — Progress Notes (Signed)
Letter sent to patient with lab results.  Her cholesterol is slightly elevated, and has gradually been increasing over your last few visits. This can be corrected with diet and exercise.

## 2016-02-05 ENCOUNTER — Ambulatory Visit (HOSPITAL_COMMUNITY): Payer: BLUE CROSS/BLUE SHIELD | Attending: Cardiovascular Disease

## 2016-02-05 DIAGNOSIS — I119 Hypertensive heart disease without heart failure: Secondary | ICD-10-CM | POA: Insufficient documentation

## 2016-02-05 DIAGNOSIS — E785 Hyperlipidemia, unspecified: Secondary | ICD-10-CM | POA: Insufficient documentation

## 2016-02-05 DIAGNOSIS — I359 Nonrheumatic aortic valve disorder, unspecified: Secondary | ICD-10-CM | POA: Diagnosis not present

## 2016-02-05 DIAGNOSIS — Z6835 Body mass index (BMI) 35.0-35.9, adult: Secondary | ICD-10-CM | POA: Insufficient documentation

## 2016-02-05 DIAGNOSIS — I352 Nonrheumatic aortic (valve) stenosis with insufficiency: Secondary | ICD-10-CM | POA: Insufficient documentation

## 2016-02-05 DIAGNOSIS — I340 Nonrheumatic mitral (valve) insufficiency: Secondary | ICD-10-CM

## 2016-02-05 DIAGNOSIS — E669 Obesity, unspecified: Secondary | ICD-10-CM | POA: Insufficient documentation

## 2016-02-05 DIAGNOSIS — I059 Rheumatic mitral valve disease, unspecified: Secondary | ICD-10-CM | POA: Diagnosis present

## 2016-02-07 ENCOUNTER — Telehealth: Payer: Self-pay | Admitting: *Deleted

## 2016-02-07 NOTE — Telephone Encounter (Signed)
-----   Message from Leonie Man, MD sent at 02/05/2016  4:40 PM EDT ----- Echo f/u NOTE  Good news. Valvular lesions appear to be relatively stable. Mitral regurgitation read as maybe mild. Aortic valve is read as having mild stenosis with moderate regurgitation still.  This is relatively stable since last year. I think we can now wait 2 years for next follow-up.  Glenetta Hew, MD

## 2016-02-07 NOTE — Telephone Encounter (Signed)
Spoke to husband results given. Any question may call back.

## 2016-02-20 NOTE — Telephone Encounter (Signed)
Spoke with patient about x rays. There were no orders. Sent a message to [employer].  Told pt.pt woulld appreciate a call back.

## 2016-02-24 NOTE — Telephone Encounter (Signed)
Please call patient and ask her if this is concerning her shoulder pain. At her annual exam we discussed pain in her shoulder due to repetitive movements at her job, and she would need to schedule an appointment to have her shoulder pain assessed. I am happy to take a look at it if it continues to bother her. Thank you!

## 2016-02-25 ENCOUNTER — Telehealth: Payer: Self-pay | Admitting: Emergency Medicine

## 2016-02-25 NOTE — Telephone Encounter (Signed)
Perfect. She does need to come in for this.  Thank you so much![name] Kitchen

## 2016-02-25 NOTE — Telephone Encounter (Signed)
Spoke with patient- C/o lower back pain radiating down both hips. The pain worsens with long periods of sitting in one place. The pain is intermit, unable to fully describe but achy. Advised to schedule f/u visit with [facility].

## 2016-03-06 ENCOUNTER — Ambulatory Visit (INDEPENDENT_AMBULATORY_CARE_PROVIDER_SITE_OTHER): Payer: BLUE CROSS/BLUE SHIELD

## 2016-03-06 ENCOUNTER — Ambulatory Visit (INDEPENDENT_AMBULATORY_CARE_PROVIDER_SITE_OTHER): Payer: BLUE CROSS/BLUE SHIELD | Admitting: Physician Assistant

## 2016-03-06 VITALS — BP 126/78 | HR 69 | Temp 98.6°F | Resp 16 | Ht 65.0 in | Wt 216.0 lb

## 2016-03-06 DIAGNOSIS — M545 Low back pain, unspecified: Secondary | ICD-10-CM

## 2016-03-06 DIAGNOSIS — G8929 Other chronic pain: Secondary | ICD-10-CM

## 2016-03-06 DIAGNOSIS — M25561 Pain in right knee: Secondary | ICD-10-CM

## 2016-03-06 DIAGNOSIS — M1710 Unilateral primary osteoarthritis, unspecified knee: Secondary | ICD-10-CM | POA: Diagnosis not present

## 2016-03-06 DIAGNOSIS — M179 Osteoarthritis of knee, unspecified: Secondary | ICD-10-CM

## 2016-03-06 MED ORDER — CYCLOBENZAPRINE HCL 10 MG PO TABS: 10.0000 mg | ORAL_TABLET | Freq: Three times a day (TID) | ORAL | 0 refills | Status: DC | PRN

## 2016-03-06 MED ORDER — MELOXICAM 7.5 MG PO TABS: 7.5000 mg | ORAL_TABLET | Freq: Every day | ORAL | 0 refills | Status: DC

## 2016-03-06 MED ORDER — MELOXICAM 7.5 MG PO TABS: 7.5000 mg | ORAL_TABLET | Freq: Every day | ORAL | 2 refills | Status: DC

## 2016-03-06 NOTE — Progress Notes (Signed)
Lisa Miles  MRN: [id number] DOB: 09-17-1950  PCP: Annye Asa, MD  Subjective:  Pt is a pleasant 65 year old female, history of HTN, mitral regurgitation and aortic valve disorder, who presents to clinic for generalized body aches.   Low back pain - Takes her a while in the morning to "straighten myself out". 10/10 pain. Worse when she gets up in the morning, gets better as the day progressed.  The longer she sits the worse her pain is. "feels stiff". Denies saddle paresthesia, loss of bowel or bladder function. Pain does not radiate to hip or down her leg.   B/l knee pain - Sometimes she feels like she is about to fall due to feeling of "bone rubbing on bone" pain. Is a sharp pain. 9/10 pain scale. Pain in right knee is greater than left. Knee pain is present when sitting and standing. Chronic, present > a year.  Right knee pain radiates up outside of thigh while exercising. Denies swelling, redness, instability, fever, chills. No MOI.  Exercises once a week. Is not taking anything for pain.   Review of Systems  Constitutional: Negative for chills, diaphoresis, fatigue and fever.  Respiratory: Negative for cough, chest tightness, shortness of breath and wheezing.   Cardiovascular: Negative for chest pain and palpitations.  Gastrointestinal: Negative for abdominal pain, diarrhea, nausea and vomiting.  Musculoskeletal: Positive for arthralgias, back pain and gait problem. Negative for joint swelling, neck pain and neck stiffness.  Neurological: Negative for dizziness, weakness, light-headedness and headaches.    Patient Active Problem List   Diagnosis Date Noted  . Menopause 08/20/2015  . Chest pain at rest 08/31/2014  . Fungal dermatitis 05/23/2014  . Heart palpitations 10/19/2013  . Thyromegaly 03/28/2013  . Routine general medical examination at a health care facility 03/28/2013  . Obesity (BMI 30-39.9) 03/28/2013  . Lower extremity edema   . Inguinodynia 12/23/2012  .  Essential hypertension 07/14/2012  . Bilateral groin pain 07/14/2012  . Dyslipidemia 05/30/2009  . Mitral regurgitation due to cusp prolapse - moderate MR with mild prolapse 05/30/2009  . Aortic valve disorder 05/30/2009  . ASTHMA UNSPECIFIED WITH EXACERBATION 04/02/2009    Current Outpatient Prescriptions on File Prior to Visit  Medication Sig Dispense Refill  . amoxicillin (AMOXIL) 500 MG capsule Take 500 mg by mouth 3 (three) times daily.    [name] Kitchen aspirin EC 81 MG tablet Take 81 mg by mouth daily.    . calcium carbonate (OS-CAL) 600 MG TABS Take 600 mg by mouth 2 (two) times daily with a meal.    . Cholecalciferol (VITAMIN D-3) 1000 UNITS CAPS Take 1 capsule by mouth daily.    [name] Kitchen estradiol (ESTRACE) 2 MG tablet Take 2 mg by mouth daily.    . hydrochlorothiazide (HYDRODIURIL) 25 MG tablet TAKE 1 TABLET DAILY (PLEASE MAKE APPOINTMENT) 90 tablet 3  . lisinopril (PRINIVIL,ZESTRIL) 20 MG tablet Take 1 tablet (20 mg total) by mouth daily. 90 tablet 2  . traMADol (ULTRAM) 50 MG tablet Take 1 tablet (50 mg total) by mouth every 6 (six) hours as needed. Reported on 08/20/2015 30 tablet 0   No current facility-administered medications on file prior to visit.     No Known Allergies   Objective:  BP 126/78   Pulse 69   Temp 98.6 F (37 C) (Oral)   Resp 16   Ht 5\' 5"  (1.651 m)   Wt 216 lb (98 kg)   SpO2 96%   BMI 35.94 kg/m  Physical Exam  Constitutional: She is oriented to [place], place, and time and well-developed, well-nourished, and in no distress. No distress.  Cardiovascular: Normal rate, regular rhythm and normal heart sounds.   Pulmonary/Chest: Effort normal. No respiratory distress.  Musculoskeletal:       Right knee: She exhibits normal range of motion, no swelling, no effusion and no deformity. Tenderness found. Medial joint line tenderness noted. No patellar tendon tenderness noted.       Lumbar back: She exhibits decreased range of motion and tenderness. She exhibits no bony  tenderness, no swelling and no spasm.  Neurological: She is alert and oriented to [place], place, and time. GCS score is 15.  Skin: Skin is warm and dry.  Psychiatric: Mood, memory, affect and judgment normal.  Vitals reviewed.   Dg Knee Complete 4 Views Right  Result Date: 03/06/2016 CLINICAL DATA:  Right knee pain EXAM: RIGHT KNEE - COMPLETE 4+ VIEW COMPARISON:  None. FINDINGS: No fracture or dislocation is seen. Mild tricompartmental degenerative changes. The visualized soft tissues are unremarkable. No suprapatellar knee joint effusion IMPRESSION: No acute osseus abnormality is seen. Mild degenerative changes. Electronically Signed   By: Julian Hy M.D.   On: 03/06/2016 14:02   Assessment and Plan :  1. Chronic pain of right knee 2. Osteoarthritis of knee - DG Knee Complete 4 Views Right; Future - meloxicam (MOBIC) 7.5 MG tablet; Take 1 tablet (7.5 mg total) by mouth daily. May take two a day  Dispense: 30 tablet; Refill: 2  3. Chronic low back pain without sciatica, unspecified back pain laterality - cyclobenzaprine (FLEXERIL) 10 MG tablet; Take 1 tablet (10 mg total) by mouth 3 (three) times daily as needed for muscle spasms.  Dispense: 30 tablet; Refill: 0 -Discussed with patient use of muscle relaxer + Meloxicam is to help with ability perform stretches and back exercises. Demonstrated stretches and exercises for her and printed them out. Encouraged use of heat, movement and walking to help reduce pain and soreness.  RTC in one month if no improvement of pain. Consider intramuscular injections (knee) or referral to ortho or PT at that time.   Mercer Pod, PA-C  [facility] 03/06/2016 12:48 PM

## 2016-03-06 NOTE — Patient Instructions (Addendum)
Patient is to perform exercises below at 5 sets with 10 repetitions. Stretches are to be performed for 5 sets, 10 seconds each. Recommended she perform this rehab twice daily within pain tolerance for 2 weeks. If you are not better in 3-4 weeks return to clinic and we will refer your to physical therapy.   Flexeril is a muscle relaxer. Mobic is an anti-inflammatory. If one isn't helping you enough, may take two Mobic a day, if needed. (Dosing schedule is how you feel: Two in a.m., two in p.m., one in a.m. One in p.m.)  Thank you for coming in today. I hope you feel we met your needs.  Feel free to call [facility] if you have any questions or further requests.  Please consider signing up for MyChart if you do not already have it, as this is a great way to communicate with me.  Best,  Whitney McVey, PA-C   IF you received an x-ray today, you will receive an invoice from [facility]. Please contact [facility] at [phone] with questions or concerns regarding your invoice.   IF you received labwork today, you will receive an invoice from [facility]. Please contact [facility] at [phone] with questions or concerns regarding your invoice.   Our billing staff will not be able to assist you with questions regarding bills from these companies.  You will be contacted with the lab results as soon as they are available. The fastest way to get your results is to activate your My Chart account. Instructions are located on the last page of this paperwork. If you have not heard from us regarding the results in 2 weeks, please contact this office.     Low Back Strain With Rehab A strain is an injury in which a tendon or muscle is torn. The muscles and tendons of the lower back are vulnerable to strains. However, these muscles and tendons are very strong and require a great force to be injured. Strains are classified into three categories. Grade 1 strains cause  pain, but the tendon is not lengthened. Grade 2 strains include a lengthened ligament, due to the ligament being stretched or partially ruptured. With grade 2 strains there is still function, although the function may be decreased. Grade 3 strains involve a complete tear of the tendon or muscle, and function is usually impaired. SYMPTOMS   Pain in the lower back.  Pain that affects one side more than the other.  Pain that gets worse with movement and may be felt in the hip, buttocks, or back of the thigh.  Muscle spasms of the muscles in the back.  Swelling along the muscles of the back.  Loss of strength of the back muscles.  Crackling sound (crepitation) when the muscles are touched. CAUSES  Lower back strains occur when a force is placed on the muscles or tendons that is greater than they can handle. Common causes of injury include:  Prolonged overuse of the muscle-tendon units in the lower back, usually from incorrect posture.  A single violent injury or force applied to the back. RISK INCREASES WITH:  Sports that involve twisting forces on the spine or a lot of bending at the waist (football, rugby, weightlifting, bowling, golf, tennis, speed skating, racquetball, swimming, running, gymnastics, diving).  Poor strength and flexibility.  Failure to warm up properly before activity.  Family history of lower back pain or disk disorders.  Previous back injury or surgery (especially fusion).  Poor posture with lifting,   especially heavy objects.  Prolonged sitting, especially with poor posture. PREVENTION   Learn and use proper posture when sitting or lifting (maintain proper posture when sitting, lift using the knees and legs, not at the waist).  Warm up and stretch properly before activity.  Allow for adequate recovery between workouts.  Maintain physical fitness:  Strength, flexibility, and endurance.  Cardiovascular fitness. PROGNOSIS  If treated properly, lower  back strains usually heal within 6 weeks. RELATED COMPLICATIONS   Recurring symptoms, resulting in a chronic problem.  Chronic inflammation, scarring, and partial muscle-tendon tear.  Delayed healing or resolution of symptoms.  Prolonged disability. TREATMENT  Treatment first involves the use of ice and medicine, to reduce pain and inflammation. The use of strengthening and stretching exercises may help reduce pain with activity. These exercises may be performed at home or with a therapist. Severe injuries may require referral to a therapist for further evaluation and treatment, such as ultrasound. Your caregiver may advise that you wear a back brace or corset, to help reduce pain and discomfort. Often, prolonged bed rest results in greater harm then benefit. Corticosteroid injections may be recommended. However, these should be reserved for the most serious cases. It is important to avoid using your back when lifting objects. At night, sleep on your back on a firm mattress with a pillow placed under your knees. If non-surgical treatment is unsuccessful, surgery may be needed.  MEDICATION   If pain medicine is needed, nonsteroidal anti-inflammatory medicines (aspirin and ibuprofen), or other minor pain relievers (acetaminophen), are often advised.  Do not take pain medicine for 7 days before surgery.  Prescription pain relievers may be given, if your caregiver thinks they are needed. Use only as directed and only as much as you need.  Ointments applied to the skin may be helpful.  Corticosteroid injections may be given by your caregiver. These injections should be reserved for the most serious cases, because they may only be given a certain number of times. HEAT AND COLD  Cold treatment (icing) should be applied for 10 to 15 minutes every 2 to 3 hours for inflammation and pain, and immediately after activity that aggravates your symptoms. Use ice packs or an ice massage.  Heat treatment  may be used before performing stretching and strengthening activities prescribed by your caregiver, physical therapist, or athletic trainer. Use a heat pack or a warm water soak. SEEK MEDICAL CARE IF:   Symptoms get worse or do not improve in 2 to 4 weeks, despite treatment.  You develop numbness, weakness, or loss of bowel or bladder function.  New, unexplained symptoms develop. (Drugs used in treatment may produce side effects.) EXERCISES  RANGE OF MOTION (ROM) AND STRETCHING EXERCISES - Low Back Strain Most people with lower back pain will find that their symptoms get worse with excessive bending forward (flexion) or arching at the lower back (extension). The exercises which will help resolve your symptoms will focus on the opposite motion.  Your physician, physical therapist or athletic trainer will help you determine which exercises will be most helpful to resolve your lower back pain. Do not complete any exercises without first consulting with your caregiver. Discontinue any exercises which make your symptoms worse until you speak to your caregiver.  If you have pain, numbness or tingling which travels down into your buttocks, leg or foot, the goal of the therapy is for these symptoms to move closer to your back and eventually resolve. Sometimes, these leg symptoms will get better,   but your lower back pain may worsen. This is typically an indication of progress in your rehabilitation. Be very alert to any changes in your symptoms and the activities in which you participated in the 24 hours prior to the change. Sharing this information with your caregiver will allow him/her to most efficiently treat your condition.  These exercises may help you when beginning to rehabilitate your injury. Your symptoms may resolve with or without further involvement from your physician, physical therapist or athletic trainer. While completing these exercises, remember:  Restoring tissue flexibility helps normal  motion to return to the joints. This allows healthier, less painful movement and activity.  An effective stretch should be held for at least 30 seconds.  A stretch should never be painful. You should only feel a gentle lengthening or release in the stretched tissue. FLEXION RANGE OF MOTION AND STRETCHING EXERCISES: STRETCH - Flexion, Single Knee to Chest   Lie on a firm bed or floor with both legs extended in front of you.  Keeping one leg in contact with the floor, bring your opposite knee to your chest. Hold your leg in place by either grabbing behind your thigh or at your knee.  Pull until you feel a gentle stretch in your lower back. Hold __________ seconds.  Slowly release your grasp and repeat the exercise with the opposite side. Repeat __________ times. Complete this exercise __________ times per day.  STRETCH - Flexion, Double Knee to Chest   Lie on a firm bed or floor with both legs extended in front of you.  Keeping one leg in contact with the floor, bring your opposite knee to your chest.  Tense your stomach muscles to support your back and then lift your other knee to your chest. Hold your legs in place by either grabbing behind your thighs or at your knees.  Pull both knees toward your chest until you feel a gentle stretch in your lower back. Hold __________ seconds.  Tense your stomach muscles and slowly return one leg at a time to the floor. Repeat __________ times. Complete this exercise __________ times per day.  STRETCH - Low Trunk Rotation  Lie on a firm bed or floor. Keeping your legs in front of you, bend your knees so they are both pointed toward the ceiling and your feet are flat on the floor.  Extend your arms out to the side. This will stabilize your upper body by keeping your shoulders in contact with the floor.  Gently and slowly drop both knees together to one side until you feel a gentle stretch in your lower back. Hold for __________ seconds.  Tense  your stomach muscles to support your lower back as you bring your knees back to the starting position. Repeat the exercise to the other side. Repeat __________ times. Complete this exercise __________ times per day  EXTENSION RANGE OF MOTION AND FLEXIBILITY EXERCISES: STRETCH - Extension, Prone on Elbows   Lie on your stomach on the floor, a bed will be too soft. Place your palms about shoulder width apart and at the height of your head.  Place your elbows under your shoulders. If this is too painful, stack pillows under your chest.  Allow your body to relax so that your hips drop lower and make contact more completely with the floor.  Hold this position for __________ seconds.  Slowly return to lying flat on the floor. Repeat __________ times. Complete this exercise __________ times per day.  RANGE OF MOTION -   Extension, Prone Press Ups  Lie on your stomach on the floor, a bed will be too soft. Place your palms about shoulder width apart and at the height of your head.  Keeping your back as relaxed as possible, slowly straighten your elbows while keeping your hips on the floor. You may adjust the placement of your hands to maximize your comfort. As you gain motion, your hands will come more underneath your shoulders.  Hold this position __________ seconds.  Slowly return to lying flat on the floor. Repeat __________ times. Complete this exercise __________ times per day.  RANGE OF MOTION- Quadruped, Neutral Spine   Assume a hands and knees position on a firm surface. Keep your hands under your shoulders and your knees under your hips. You may place padding under your knees for comfort.  Drop your head and point your tail bone toward the ground below you. This will round out your lower back like an angry cat. Hold this position for __________ seconds.  Slowly lift your head and release your tail bone so that your back sags into a large arch, like an old horse.  Hold this position for  __________ seconds.  Repeat this until you feel limber in your lower back.  Now, find your "sweet spot." This will be the most comfortable position somewhere between the two previous positions. This is your neutral spine. Once you have found this position, tense your stomach muscles to support your lower back.  Hold this position for __________ seconds. Repeat __________ times. Complete this exercise __________ times per day.  STRENGTHENING EXERCISES - Low Back Strain These exercises may help you when beginning to rehabilitate your injury. These exercises should be done near your "sweet spot." This is the neutral, low-back arch, somewhere between fully rounded and fully arched, that is your least painful position. When performed in this safe range of motion, these exercises can be used for people who have either a flexion or extension based injury. These exercises may resolve your symptoms with or without further involvement from your physician, physical therapist or athletic trainer. While completing these exercises, remember:   Muscles can gain both the endurance and the strength needed for everyday activities through controlled exercises.  Complete these exercises as instructed by your physician, physical therapist or athletic trainer. Increase the resistance and repetitions only as guided.  You may experience muscle soreness or fatigue, but the pain or discomfort you are trying to eliminate should never worsen during these exercises. If this pain does worsen, stop and make certain you are following the directions exactly. If the pain is still present after adjustments, discontinue the exercise until you can discuss the trouble with your caregiver. STRENGTHENING - Deep Abdominals, Pelvic Tilt  Lie on a firm bed or floor. Keeping your legs in front of you, bend your knees so they are both pointed toward the ceiling and your feet are flat on the floor.  Tense your lower abdominal muscles to  press your lower back into the floor. This motion will rotate your pelvis so that your tail bone is scooping upwards rather than pointing at your feet or into the floor.  With a gentle tension and even breathing, hold this position for __________ seconds. Repeat __________ times. Complete this exercise __________ times per day.  STRENGTHENING - Abdominals, Crunches   Lie on a firm bed or floor. Keeping your legs in front of you, bend your knees so they are both pointed toward the ceiling and your feet  are flat on the floor. Cross your arms over your chest.  Slightly tip your chin down without bending your neck.  Tense your abdominals and slowly lift your trunk high enough to just clear your shoulder blades. Lifting higher can put excessive stress on the lower back and does not further strengthen your abdominal muscles.  Control your return to the starting position. Repeat __________ times. Complete this exercise __________ times per day.  STRENGTHENING - Quadruped, Opposite UE/LE Lift   Assume a hands and knees position on a firm surface. Keep your hands under your shoulders and your knees under your hips. You may place padding under your knees for comfort.  Find your neutral spine and gently tense your abdominal muscles so that you can maintain this position. Your shoulders and hips should form a rectangle that is parallel with the floor and is not twisted.  Keeping your trunk steady, lift your right hand no higher than your shoulder and then your left leg no higher than your hip. Make sure you are not holding your breath. Hold this position __________ seconds.  Continuing to keep your abdominal muscles tense and your back steady, slowly return to your starting position. Repeat with the opposite arm and leg. Repeat __________ times. Complete this exercise __________ times per day.  STRENGTHENING - Lower Abdominals, Double Knee Lift  Lie on a firm bed or floor. Keeping your legs in front of  you, bend your knees so they are both pointed toward the ceiling and your feet are flat on the floor.  Tense your abdominal muscles to brace your lower back and slowly lift both of your knees until they come over your hips. Be certain not to hold your breath.  Hold __________ seconds. Using your abdominal muscles, return to the starting position in a slow and controlled manner. Repeat __________ times. Complete this exercise __________ times per day.  POSTURE AND BODY MECHANICS CONSIDERATIONS - Low Back Strain Keeping correct posture when sitting, standing or completing your activities will reduce the stress put on different body tissues, allowing injured tissues a chance to heal and limiting painful experiences. The following are general guidelines for improved posture. Your physician or physical therapist will provide you with any instructions specific to your needs. While reading these guidelines, remember:  The exercises prescribed by your provider will help you have the flexibility and strength to maintain correct postures.  The correct posture provides the best environment for your joints to work. All of your joints have less wear and tear when properly supported by a spine with good posture. This means you will experience a healthier, less painful body.  Correct posture must be practiced with all of your activities, especially prolonged sitting and standing. Correct posture is as important when doing repetitive low-stress activities (typing) as it is when doing a single heavy-load activity (lifting). RESTING POSITIONS Consider which positions are most painful for you when choosing a resting position. If you have pain with flexion-based activities (sitting, bending, stooping, squatting), choose a position that allows you to rest in a less flexed posture. You would want to avoid curling into a fetal position on your side. If your pain worsens with extension-based activities (prolonged standing,  working overhead), avoid resting in an extended position such as sleeping on your stomach. Most people will find more comfort when they rest with their spine in a more neutral position, neither too rounded nor too arched. Lying on a non-sagging bed on your side with a pillow between   your knees, or on your back with a pillow under your knees will often provide some relief. Keep in mind, being in any one position for a prolonged period of time, no matter how correct your posture, can still lead to stiffness. PROPER SITTING POSTURE In order to minimize stress and discomfort on your spine, you must sit with correct posture. Sitting with good posture should be effortless for a healthy body. Returning to good posture is a gradual process. Many people can work toward this most comfortably by using various supports until they have the flexibility and strength to maintain this posture on their own. When sitting with proper posture, your ears will fall over your shoulders and your shoulders will fall over your hips. You should use the back of the chair to support your upper back. Your lower back will be in a neutral position, just slightly arched. You may place a small pillow or folded towel at the base of your lower back for support.  When working at a desk, create an environment that supports good, upright posture. Without extra support, muscles tire, which leads to excessive strain on joints and other tissues. Keep these recommendations in mind: CHAIR:  A chair should be able to slide under your desk when your back makes contact with the back of the chair. This allows you to work closely.  The chair's height should allow your eyes to be level with the upper part of your monitor and your hands to be slightly lower than your elbows. BODY POSITION  Your feet should make contact with the floor. If this is not possible, use a foot rest.  Keep your ears over your shoulders. This will reduce stress on your neck and  lower back. INCORRECT SITTING POSTURES  If you are feeling tired and unable to assume a healthy sitting posture, do not slouch or slump. This puts excessive strain on your back tissues, causing more damage and pain. Healthier options include:  Using more support, like a lumbar pillow.  Switching tasks to something that requires you to be upright or walking.  Talking a brief walk.  Lying down to rest in a neutral-spine position. PROLONGED STANDING WHILE SLIGHTLY LEANING FORWARD  When completing a task that requires you to lean forward while standing in one place for a long time, place either foot up on a stationary 2-4 inch high object to help maintain the best posture. When both feet are on the ground, the lower back tends to lose its slight inward curve. If this curve flattens (or becomes too large), then the back and your other joints will experience too much stress, tire more quickly, and can cause pain. CORRECT STANDING POSTURES Proper standing posture should be assumed with all daily activities, even if they only take a few moments, like when brushing your teeth. As in sitting, your ears should fall over your shoulders and your shoulders should fall over your hips. You should keep a slight tension in your abdominal muscles to brace your spine. Your tailbone should point down to the ground, not behind your body, resulting in an over-extended swayback posture.  INCORRECT STANDING POSTURES  Common incorrect standing postures include a forward head, locked knees and/or an excessive swayback. WALKING Walk with an upright posture. Your ears, shoulders and hips should all line-up. PROLONGED ACTIVITY IN A FLEXED POSITION When completing a task that requires you to bend forward at your waist or lean over a low surface, try to find a way to stabilize 3  out of 4 of your limbs. You can place a hand or elbow on your thigh or rest a knee on the surface you are reaching across. This will provide you more  stability so that your muscles do not fatigue as quickly. By keeping your knees relaxed, or slightly bent, you will also reduce stress across your lower back. CORRECT LIFTING TECHNIQUES DO :   Assume a wide stance. This will provide you more stability and the opportunity to get as close as possible to the object which you are lifting.  Tense your abdominals to brace your spine. Bend at the knees and hips. Keeping your back locked in a neutral-spine position, lift using your leg muscles. Lift with your legs, keeping your back straight.  Test the weight of unknown objects before attempting to lift them.  Try to keep your elbows locked down at your sides in order get the best strength from your shoulders when carrying an object.  Always ask for help when lifting heavy or awkward objects. INCORRECT LIFTING TECHNIQUES DO NOT:   Lock your knees when lifting, even if it is a small object.  Bend and twist. Pivot at your feet or move your feet when needing to change directions.  Assume that you can safely pick up even a paper clip without proper posture.   This information is not intended to replace advice given to you by your health care provider. Make sure you discuss any questions you have with your health care provider.   Document Released: 04/13/2005 Document Revised: 05/04/2014 Document Reviewed: 07/26/2008 Elsevier Interactive Patient Education [employer].

## 2016-06-03 ENCOUNTER — Other Ambulatory Visit: Payer: Self-pay | Admitting: Cardiology

## 2016-06-03 NOTE — Telephone Encounter (Signed)
°*  STAT* If patient is at the pharmacy, call can be transferred to refill team.   1. Which medications need to be refilled? (please list name of each medication and dose if known) Lisinopril 20mg  and HZTZ 25mg   2. Which pharmacy/location (including street and city if local pharmacy) is medication to be sent to? [place]  3. Do they need a 30 day or 90 day supply? 90 day supply  Pt now has [employer] and couldn't request refills from pharmacy

## 2016-06-04 MED ORDER — LISINOPRIL 20 MG PO TABS: 20.0000 mg | ORAL_TABLET | Freq: Every day | ORAL | 1 refills | Status: DC

## 2016-06-04 MED ORDER — HYDROCHLOROTHIAZIDE 25 MG PO TABS: 25.0000 mg | ORAL_TABLET | Freq: Every day | ORAL | 1 refills | Status: DC

## 2016-06-04 NOTE — Telephone Encounter (Signed)
Rx(s) sent to pharmacy electronically.  

## 2016-07-02 DIAGNOSIS — Z124 Encounter for screening for malignant neoplasm of cervix: Secondary | ICD-10-CM | POA: Diagnosis not present

## 2016-07-02 DIAGNOSIS — N3945 Continuous leakage: Secondary | ICD-10-CM | POA: Diagnosis not present

## 2016-07-02 DIAGNOSIS — Z1272 Encounter for screening for malignant neoplasm of vagina: Secondary | ICD-10-CM | POA: Diagnosis not present

## 2016-07-02 DIAGNOSIS — Z6837 Body mass index (BMI) 37.0-37.9, adult: Secondary | ICD-10-CM | POA: Diagnosis not present

## 2016-07-22 ENCOUNTER — Ambulatory Visit (INDEPENDENT_AMBULATORY_CARE_PROVIDER_SITE_OTHER): Payer: PPO | Admitting: Family Medicine

## 2016-07-22 ENCOUNTER — Encounter: Payer: Self-pay | Admitting: Family Medicine

## 2016-07-22 VITALS — BP 143/81 | HR 69 | Temp 98.1°F | Resp 16 | Ht 65.0 in | Wt 225.0 lb

## 2016-07-22 DIAGNOSIS — I10 Essential (primary) hypertension: Secondary | ICD-10-CM | POA: Diagnosis not present

## 2016-07-22 DIAGNOSIS — Z1159 Encounter for screening for other viral diseases: Secondary | ICD-10-CM

## 2016-07-22 DIAGNOSIS — Z23 Encounter for immunization: Secondary | ICD-10-CM | POA: Diagnosis not present

## 2016-07-22 DIAGNOSIS — Z0000 Encounter for general adult medical examination without abnormal findings: Secondary | ICD-10-CM | POA: Diagnosis not present

## 2016-07-22 NOTE — Patient Instructions (Addendum)
We recommend that you schedule a mammogram for breast cancer screening. Typically, you do not need a referral to do this. Please contact a local imaging center to schedule your mammogram.  [facility] - [phone]  *ask for the Radiology Department [facility] - [phone] or [phone]  [facility] - [phone] [place] [phone] [facility] - [phone]  *ask for the [facility] - [phone]  *ask for the Radiology Department [facility] - [phone]  *ask for the [place] - [phone]    IF you received an x-ray today, you will receive an invoice from [facility]. Please contact [facility] at [phone] with questions or concerns regarding your invoice.   IF you received labwork today, you will receive an invoice from [facility]. Please contact [facility] at [phone] with questions or concerns regarding your invoice.   Our billing staff will not be able to assist you with questions regarding bills from these companies.  You will be contacted with the lab results as soon as they are available. The fastest way to get your results is to activate your My Chart account. Instructions are located on the last page of this paperwork. If you have not heard from [place] regarding the results in 2 weeks, please contact this office.      Advance Directive Advance directives are legal documents that let you make choices ahead of time about your health care and medical treatment in case you become unable to communicate for yourself. Advance directives are a way for you to communicate your wishes to family, friends, and health care providers. This can help convey your decisions about end-of-life care if you become unable to communicate. Discussing and writing advance directives should happen over time  rather than all at once. Advance directives can be changed depending on your situation and what you want, even after you have signed the advance directives. If you do not have an advance directive, some states assign family decision makers to act on your behalf based on how closely you are related to them. Each state has its own laws regarding advance directives. You may want to check with your health care provider, attorney, or state representative about the laws in your state. There are different types of advance directives, such as:  Medical power of attorney.  Living will.  Do not resuscitate (DNR) or do not attempt resuscitation (DNAR) order. Health care proxy and medical power of attorney A health care proxy, also called a health care agent, is a [place] who is appointed to make medical decisions for you in cases in which you are unable to make the decisions yourself. Generally, people choose someone they know well and trust to represent their preferences. Make sure to ask this Kachel for an agreement to act as your proxy. A proxy may have to exercise judgment in the event of a medical decision for which your wishes are not known. A medical power of attorney is a legal document that names your health care proxy. Depending on the laws in your state, after the document is written, it may also need to be:  Signed.  Notarized.  Dated.  Copied.  Witnessed.  Incorporated into your medical record. You may also want to appoint someone to manage your financial affairs in a situation in which you are unable to do so. This is called a durable power of attorney  for finances. It is a separate legal document from the durable power of attorney for health care. You may choose the same [place] or someone different from your health care proxy to act as your agent in financial matters. If you do not appoint a proxy, or if there is a concern that the proxy is not acting in your best interests, a  court-appointed guardian may be designated to act on your behalf. Living will A living will is a set of instructions documenting your wishes about medical care when you cannot express them yourself. Health care providers should keep a copy of your living will in your medical record. You may want to give a copy to family members or friends. To alert caregivers in case of an emergency, you can place a card in your wallet to let them know that you have a living will and where they can find it. A living will is used if you become:  Terminally ill.  Incapacitated.  Unable to communicate or make decisions. Items to consider in your living will include:  The use or non-use of life-sustaining equipment, such as dialysis machines and breathing machines (ventilators).  A DNR or DNAR order, which is the instruction not to use cardiopulmonary resuscitation (CPR) if breathing or heartbeat stops.  The use or non-use of tube feeding.  Withholding of food and fluids.  Comfort (palliative) care when the goal becomes comfort rather than a cure.  Organ and tissue donation. A living will does not give instructions for distributing your money and property if you should pass away. It is recommended that you seek the advice of a lawyer when writing a will. Decisions about taxes, beneficiaries, and asset distribution will be legally binding. This process can relieve your family and friends of any concerns surrounding disputes or questions that may come up about the distribution of your assets. DNR or DNAR A DNR or DNAR order is a request not to have CPR in the event that your heart stops beating or you stop breathing. If a DNR or DNAR order has not been made and shared, a health care provider will try to help any patient whose heart has stopped or who has stopped breathing. If you plan to have surgery, talk with your health care provider about how your DNR or DNAR order will be followed if problems  occur. Summary  Advance directives are the legal documents that allow you to make choices ahead of time about your health care and medical treatment in case you become unable to communicate for yourself.  The process of discussing and writing advance directives should happen over time. You can change the advance directives, even after you have signed them.  Advance directives include DNR or DNAR orders, living wills, and designating an agent as your medical power of attorney. This information is not intended to replace advice given to you by your health care provider. Make sure you discuss any questions you have with your health care provider. Document Released: 07/21/2007 Document Revised: 03/02/2016 Document Reviewed: 03/02/2016 Elsevier Interactive Patient Education  2017 [employer].

## 2016-07-22 NOTE — Progress Notes (Signed)
Lisa Miles  29-May-1950 66 y.o. female  Presents today for Welcome to Medicare Visit.  Other items to address today: none   Cancer Screening: Cervical: n/a s/p hysterectomy and pt sees Gyne Breast: yes, mammogram utd, next mammogram due 07/2016 Colon: yes  Prostate: n/a    Immunization status: up to date and documented.   ADVANCE DIRECTIVES: Discussed: no On File: no Materials Provided: yes   Last screening for diabetes: 07/22/2016 Last EKG: 09/26/2015 Last lipid screening: 07/22/2016   Patient Active Problem List   Diagnosis Date Noted  . Menopause 08/20/2015  . Chest pain at rest 08/31/2014  . Fungal dermatitis 05/23/2014  . Heart palpitations 10/19/2013  . Thyromegaly 03/28/2013  . Routine general medical examination at a health care facility 03/28/2013  . Obesity (BMI 30-39.9) 03/28/2013  . Lower extremity edema   . Inguinodynia 12/23/2012  . Essential hypertension 07/14/2012  . Bilateral groin pain 07/14/2012  . Dyslipidemia 05/30/2009  . Mitral regurgitation due to cusp prolapse - moderate MR with mild prolapse 05/30/2009  . Aortic valve disorder 05/30/2009  . ASTHMA UNSPECIFIED WITH EXACERBATION 04/02/2009  . Heart murmur 04/02/2009     Past Medical History:  Diagnosis Date  . Aortic stenosis, mild 07/2012   TEE  -- mild to moderate aortic stenosis which is a little more significant than the mild stenosis & moderate regurgitation which is stable. MILD mitral valve prolapse w/moderate regurgitation but no stenosis. Mild elevated pulmonary pressures were noted w/moderate tricuspid regurgitation. EF = 55-60% with grade 1 diastolic dysfunction.  . Asthma   . Bilateral bunions    Bunionectomies performed  . Bronchitis, chronic ([place])   . Heart disease   . Hyperlipidemia   . Hypertension    Good control  . Inguinodynia    Bilateral groin pain  . Lower extremity edema    Chronic. Venous Duplex 11/06/11 SUMMARY: 1) Bilateral Lower Extremities: No evidence  of DVT or thrombophlebitis.  2) Right Common Femoral Vein: Demonstrated mild valvular insufficiency with a greater than (1) sec of duration. Mildly abnormal LE Venous duplex Doppler.  . Mitral regurgitation due to cusp prolapse 07/2012   TEE - mild to mod AS (~> mild) & mod AI -- stable. MILD MVP with Mod MR & no stenosis. Mild elevated PAP w/mod TR. EF = 55-60%, G1 DD;; Followup echo October 2014: EF 60-65%., Gr 1 DD, mild AS (peak/mean 34/20 mmHg), Mod MR w/ mild AMVL Prolapse  . Obesity   . Palpitations    Relatively well controlled  . Scoliosis    DG Chest 2 View x-ray on 07/30/11 by Dr. Everlene Farrier shows a scoliosis.     Past Surgical History:  Procedure Laterality Date  . ABDOMINAL HYSTERECTOMY    . ANKLE ARTHROSCOPY Left   . BUNIONECTOMY Bilateral   . KNEE ARTHROSCOPY Right   . Left knee open surgery    . TOTAL ABDOMINAL HYSTERECTOMY       Family History  Problem Relation Age of Onset  . Arthritis Mother   . Hyperlipidemia Mother   . Diabetes Mother   . Gout Mother   . Hypertension Mother   . Cancer Father     GI cancer  . Diabetes Maternal Grandfather   . Hypertension Brother   . Diabetes Brother   . Hypertension Brother      Social History   Social History  . Marital status: Married    Spouse name: Herbie [place]  . Number of children: 2  . Years of education:  N/A   Occupational History  . 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 Smoking status: Never Smoker  . Smokeless tobacco: Never Used  . Alcohol use No  . Drug use: No  . Sexual activity: No   Other Topics Concern  . Not on file   Social History Narrative   Lives with her husband.  Their children live nearby.     No Known Allergies   Prior to Admission medications   Medication Sig Start Date End Date Taking? Authorizing Provider  amoxicillin (AMOXIL) 500 MG capsule Take 500 mg by mouth 3 (three) times daily.   Yes Historical Provider, MD  aspirin EC 81 MG  tablet Take 81 mg by mouth daily.   Yes Historical Provider, MD  calcium carbonate (OS-CAL) 600 MG TABS Take 600 mg by mouth 2 (two) times daily with a meal.   Yes Historical Provider, MD  calcium-vitamin D (OSCAL WITH D) 250-125 MG-UNIT tablet Take by mouth.   Yes Historical Provider, MD  Cholecalciferol (VITAMIN D-3) 1000 UNITS CAPS Take 1 capsule by mouth daily.   Yes Historical Provider, MD  cyclobenzaprine (FLEXERIL) 10 MG tablet Take 1 tablet (10 mg total) by mouth 3 (three) times daily as needed for muscle spasms. 03/06/16  Yes Elizabeth Whitney McVey, PA-C  estradiol (ESTRACE) 2 MG tablet Take 2 mg by mouth daily.   Yes Historical Provider, MD  hydrochlorothiazide (HYDRODIURIL) 25 MG tablet Take 1 tablet (25 mg total) by mouth daily. 06/04/16  Yes Leonie Man, MD  lisinopril (PRINIVIL,ZESTRIL) 20 MG tablet Take 1 tablet (20 mg total) by mouth daily. 06/04/16  Yes Leonie Man, MD  meloxicam (MOBIC) 7.5 MG tablet Take 1 tablet (7.5 mg total) by mouth daily. May take two a day 03/06/16  Yes Elizabeth Whitney McVey, PA-C  meloxicam (MOBIC) 7.5 MG tablet Take 1 tablet (7.5 mg total) by mouth daily. May take up to 15mg  a day 03/06/16  Yes Gelene Mink McVey, PA-C  Probiotic Product (PROBIOTIC PO) Take by mouth.   Yes Historical Provider, MD  traMADol (ULTRAM) 50 MG tablet Take 1 tablet (50 mg total) by mouth every 6 (six) hours as needed. Reported on 08/20/2015 01/10/16  Yes [employer] McVey, PA-C  OMEGA 3-6-9 FATTY ACIDS PO Take by mouth.    Historical Provider, MD     Depression screen [facility] 2/9 03/06/2016 01/10/2016 08/20/2015 05/23/2014 03/28/2013  Decreased Interest 0 0 0 0 0  Down, Depressed, Hopeless 0 0 0 1 0  PHQ - 2 Score 0 0 0 1 0     Fall Risk  03/06/2016 01/10/2016 08/20/2015 05/23/2014 03/28/2013  Falls in the past year? No No Yes No No  Number falls in past yr: - - 1 - -     Functional Status Survey: Functional Status Survey: Is the patient deaf or have difficulty  hearing?: No Does the patient have difficulty seeing, even when wearing glasses/contacts?: No Does the patient have difficulty concentrating, remembering, or making decisions?: No Does the patient have difficulty walking or climbing stairs?: No Does the patient have difficulty dressing or bathing?: No Does the patient have difficulty doing errands alone such as visiting a doctor's office or shopping?: No     PHYSICAL EXAM: Vitals:   07/22/16 1134  BP: (!) 143/81  Pulse: 69  Resp: 16  Temp: 98.1 F (36.7 C)  TempSrc: Oral  SpO2: 97%  Weight: 225 lb (102.1 kg)  Height:  5\' 5"  (1.651 m)    Physical Exam  Constitutional: She is oriented to [place], place, and time. She appears well-developed and well-nourished.  HENT:  Head: Normocephalic and atraumatic.  Eyes: Conjunctivae and EOM are normal.  Neck: Normal range of motion. Neck supple.  Cardiovascular: Normal rate, regular rhythm, normal heart sounds and intact distal pulses.   Pulmonary/Chest: Effort normal and breath sounds normal. No respiratory distress. She has no wheezes. She has no rales. She exhibits no tenderness.  Neurological: She is alert and oriented to [place], place, and time.  Skin: Skin is warm. Capillary refill takes less than 2 seconds.  Psychiatric: She has a normal mood and affect. Her behavior is normal. Judgment and thought content normal.       Visual Acuity Screening   Right eye Left eye Both eyes  Without correction:     With correction: 20/20 20/20 20/20      BP (!) 143/81   Pulse 69   Temp 98.1 F (36.7 C) (Oral)   Resp 16   Ht 5\' 5"  (1.651 m)   Wt 225 lb (102.1 kg)   SpO2 97%   BMI 37.44 kg/m    Education/Counseling: yes diet and exercise yes prevention of chronic diseases n/a smoking/tobacco cessation yes review "Covered Medicare Preventive Services"    ASSESSMENT/PLAN: Lisa Miles was seen today for annual exam and immunizations.  Diagnoses and all orders for this  visit:  Welcome to Medicare preventive visit- reviewed previous ECG, Mammogram and discussed advanced directives -     Comprehensive metabolic panel -     HCV Ab w/Rflx to Verification -     Lipid panel -     TSH -     Microalbumin, urine  Flu vaccine need -     Flu Vaccine QUAD 36+ mos IM  Need for hepatitis C screening test -     HCV Ab w/Rflx to Verification  Essential hypertension- bp almost at goal Refills needed -     Comprehensive metabolic panel -     Microalbumin, urine  Other orders -     Pneumococcal conjugate vaccine 13-valent IM -     Interpretation:

## 2016-07-23 LAB — COMPREHENSIVE METABOLIC PANEL
ALT: 12 IU/L (ref 0–32)
AST: 18 IU/L (ref 0–40)
Albumin/Globulin Ratio: 1.5 (ref 1.2–2.2)
Albumin: 4 g/dL (ref 3.6–4.8)
Alkaline Phosphatase: 47 IU/L (ref 39–117)
BUN/Creatinine Ratio: 16 (ref 12–28)
BUN: 12 mg/dL (ref 8–27)
Bilirubin Total: 0.2 mg/dL (ref 0.0–1.2)
CO2: 26 mmol/L (ref 18–29)
Calcium: 9.6 mg/dL (ref 8.7–10.3)
Chloride: 100 mmol/L (ref 96–106)
Creatinine, Ser: 0.74 mg/dL (ref 0.57–1.00)
GFR calc Af Amer: 98 mL/min/{1.73_m2} (ref >59)
GFR calc non Af Amer: 85 mL/min/{1.73_m2} (ref >59)
Globulin, Total: 2.7 g/dL (ref 1.5–4.5)
Glucose: 87 mg/dL (ref 65–99)
Potassium: 4.2 mmol/L (ref 3.5–5.2)
Sodium: 141 mmol/L (ref 134–144)
Total Protein: 6.7 g/dL (ref 6.0–8.5)

## 2016-07-23 LAB — TSH: TSH: 3.16 u[IU]/mL (ref 0.450–4.500)

## 2016-07-23 LAB — MICROALBUMIN, URINE: Microalbumin, Urine: <3 ug/mL

## 2016-07-23 LAB — HCV AB W/RFLX TO VERIFICATION: HCV Ab: <0.1 s/co ratio (ref 0.0–0.9)

## 2016-07-23 LAB — LIPID PANEL
Chol/HDL Ratio: 3.2 ratio units (ref 0.0–4.4)
Cholesterol, Total: 201 mg/dL — ABNORMAL HIGH (ref 100–199)
HDL: 63 mg/dL (ref >39)
LDL Calculated: 112 mg/dL — ABNORMAL HIGH (ref 0–99)
Triglycerides: 129 mg/dL (ref 0–149)
VLDL Cholesterol Cal: 26 mg/dL (ref 5–40)

## 2016-07-23 LAB — HCV INTERPRETATION

## 2016-08-04 ENCOUNTER — Encounter: Payer: Self-pay | Admitting: Cardiology

## 2016-08-04 ENCOUNTER — Ambulatory Visit (INDEPENDENT_AMBULATORY_CARE_PROVIDER_SITE_OTHER): Payer: PPO | Admitting: Cardiology

## 2016-08-04 VITALS — BP 112/64 | HR 75 | Ht 66.0 in | Wt 225.8 lb

## 2016-08-04 DIAGNOSIS — R002 Palpitations: Secondary | ICD-10-CM | POA: Diagnosis not present

## 2016-08-04 DIAGNOSIS — I359 Nonrheumatic aortic valve disorder, unspecified: Secondary | ICD-10-CM

## 2016-08-04 DIAGNOSIS — R059 Cough, unspecified: Secondary | ICD-10-CM

## 2016-08-04 DIAGNOSIS — E785 Hyperlipidemia, unspecified: Secondary | ICD-10-CM | POA: Diagnosis not present

## 2016-08-04 DIAGNOSIS — R600 Localized edema: Secondary | ICD-10-CM | POA: Diagnosis not present

## 2016-08-04 DIAGNOSIS — I340 Nonrheumatic mitral (valve) insufficiency: Secondary | ICD-10-CM

## 2016-08-04 DIAGNOSIS — I10 Essential (primary) hypertension: Secondary | ICD-10-CM

## 2016-08-04 DIAGNOSIS — R05 Cough: Secondary | ICD-10-CM

## 2016-08-04 NOTE — Patient Instructions (Addendum)
NO CHANGE WITH MEDICATION OR TREATMENT    Your physician wants you to follow-up in [place].You will receive a reminder letter in the mail two months in advance. If you don't receive a letter, please call our office to schedule the follow-up appointment.   If you need a refill on your cardiac medications before your next appointment, please call your pharmacy.

## 2016-08-04 NOTE — Progress Notes (Addendum)
PCP: Forrest Moron, MD  Clinic Note: Chief Complaint  Patient presents with  . Follow-up    Cardiac valve issues: Aortic and mitral valve. Palpitations    HPI: Lisa Miles is a 66 y.o. female with a PMH below who presents today for 10 month follow-up for her valvular heart disease. She has mild aortic stenosis and mitral regurg with prolapse. Also has hypertension and hyperlipidemia and some palpitations. .- Last echo from October 2016 showed relatively stable findings with moderate MR, mild ES and moderate AI. EF normal.  Lisa Miles was last seen on June 2017. Relatively stable from cardiac standpoint  Recent Hospitalizations: n/a  Studies Independently Reviewed:   2-D Echocardiogram October 2017: Relatively stable. Moderate concentric LVH. Moderate AI with mild AS (Mean-PeaK Gradient: 19 / 36 mmHg). Mild MR. Moderate LA dilation. PA pressures 40 mmHg -> plan follow-up in 2 years Compared with:   2-D Echocardiogram October 2016: Normal LV size and function. GR 1 DD. Moderate LAD. Calcified aortic valve with mild AS, moderate (2+ AI. Restricted posterior MV leaflet movement with moderate MR. Moderate TR with moderately elevated PA pressures (45 mmHg)   Interval History: Lisa Miles presents today overall doing fairly well.  She describes occasionally feeling or hearing her heartbeat when she lies down in a certain position. She doesn't describe it being irregular heartbeat, just that it is a interesting sound. She says she occasionally has some swelling in her feet but nothing significant. Otherwise really from a cardiac standpoint she is pretty stable overall. She denies any resting or exertional chest tightness or pressure. No resting or exertional dyspnea. No PND, orthopnea. Although she has this strange heartbeat sensations that she can hear, she denies any real irregular heartbeats or palpitations. Nothing fast. No syncope/near 6. TIA/amaurosis fugax.  No  claudication.  ROS: A comprehensive was performed. Review of Systems  Cardiovascular: Positive for leg swelling.  Gastrointestinal: Negative for blood in stool and melena.  Genitourinary: Negative for hematuria.  Musculoskeletal: Positive for joint pain. Negative for falls.  Neurological: Negative for dizziness and headaches.  Psychiatric/Behavioral: The patient is not nervous/anxious.   All other systems reviewed and are negative.   Past Medical History:  Diagnosis Date  . Aortic stenosis, mild 07/2012   Mild AS with Mod MR -(Mean/Peak Gradient 20 mmHg/34 mmHg).  . Asthma   . Bilateral bunions    Bunionectomies performed  . Bronchitis, chronic ([place])   . Heart disease   . Hyperlipidemia   . Hypertension    Good control  . Inguinodynia    Bilateral groin pain  . Lower extremity edema    Chronic. Venous Duplex 11/06/11 SUMMARY: 1) Bilateral Lower Extremities: No evidence of DVT or thrombophlebitis.  2) Right Common Femoral Vein: Demonstrated mild valvular insufficiency with a greater than (1) sec of duration. Mildly abnormal LE Venous duplex Doppler.  . Mitral regurgitation due to cusp prolapse 07/2012   Mild AMVL prolapse, Mod MR  . Obesity   . Palpitations    Relatively well controlled  . Scoliosis    DG Chest 2 View x-ray on 07/30/11 by Dr. Everlene Farrier shows a scoliosis.    Past Surgical History:  Procedure Laterality Date  . ABDOMINAL HYSTERECTOMY    . ANKLE ARTHROSCOPY Left   . BUNIONECTOMY Bilateral   . KNEE ARTHROSCOPY Right   . Left knee open surgery    . TOTAL ABDOMINAL HYSTERECTOMY    . TRANSTHORACIC ECHOCARDIOGRAM  07/2012; 01/2013   a) 4/'14: mild-mod  AS, Mod AI (mean/peak gradient: 18 mmHg / 31 mmhg).  Mild MVP w/ Mod MR, no MS.  Mildly elevated PAP (~37 mmHg) w/ Mod TR. EF 55-60%. GR 1 DD.;; b). 01/2013: EF 60-65%. No RWMA. Gr  DD. Mild AS (M / P Gradient 20 mmHg/34 mmHg). Mild AMVL Prolapse w/ Mod MR. Mod LA dilation. Mod TR.  [name] Kitchen TRANSTHORACIC ECHOCARDIOGRAM  01/2015;  01/2016   a) Normal LV size and function. GR 1 DD. Moderate LA Dilation. Calcified aortic valve with mild AS, moderate (2+) AI. Restricted posterior MV leaflet movement w/ Mod MR. Mode TR with mod elevated PA pressures (45 mmHg);; b) Relatively stable. Moderate concentric LVH. Moderate AI with mild AS (Mean-PeaK Gradient: 19 / 36 mmHg). Mild MR. Mod LA dilation. PAP ~40 mmHg -> plan follow-up 2020.    Current Meds  Medication Sig  . calcium-vitamin D (OSCAL WITH D) 250-125 MG-UNIT tablet Take by mouth.  . Cholecalciferol (VITAMIN D-3) 1000 UNITS CAPS Take 1 capsule by mouth daily.  [name] Kitchen estradiol (ESTRACE) 2 MG tablet Take 2 mg by mouth daily.  . [DISCONTINUED] amoxicillin (AMOXIL) 500 MG capsule Take 500 mg by mouth 3 (three) times daily.  . [DISCONTINUED] aspirin EC 81 MG tablet Take 81 mg by mouth daily.  . [DISCONTINUED] hydrochlorothiazide (HYDRODIURIL) 25 MG tablet Take 1 tablet (25 mg total) by mouth daily.  . [DISCONTINUED] lisinopril (PRINIVIL,ZESTRIL) 20 MG tablet Take 1 tablet (20 mg total) by mouth daily.  . [DISCONTINUED] traMADol (ULTRAM) 50 MG tablet Take 1 tablet (50 mg total) by mouth every 6 (six) hours as needed. Reported on 08/20/2015 (Patient not taking: Reported on 07/07/2017)    No Known Allergies  Social History   Socioeconomic History  . Marital status: Married    Spouse name: Lisa Miles  . Number of children: 2  . Years of education: Not on file  . Highest education level: Not on file  Occupational History  . Occupation: production    Employer: [employer]    Comment: check printing/shipping  Social Needs  . Financial resource strain: Not on file  . Food insecurity:    Worry: Not on file    Inability: Not on file  . Transportation needs:    Medical: Not on file    Non-medical: Not on file  Tobacco Use  . Smoking status: Never Smoker  . Smokeless tobacco: Never Used  Substance and Sexual Activity  . Alcohol use: No  . Drug use: No  . Sexual activity:  Never  Lifestyle  . Physical activity:    Days per week: Not on file    Minutes per session: Not on file  . Stress: Not on file  Relationships  . Social connections:    Talks on phone: Not on file    Gets together: Not on file    Attends religious service: Not on file    Active member of club or organization: Not on file    Attends meetings of clubs or organizations: Not on file    Relationship status: Not on file  Other Topics Concern  . Not on file  Social History Narrative   Lives with her husband.  Their children live nearby.    family history includes Arthritis in her mother; Cancer in her father; Diabetes in her brother, maternal grandfather, and mother; Gout in her mother; Hyperlipidemia in her mother; Hypertension in her brother, brother, and mother.  Wt Readings from Last 3 Encounters:  09/02/17 227 lb (103 kg)  07/07/17  227 lb (103 kg)  06/09/17 227 lb (103 kg)    PHYSICAL EXAM BP 112/64   Pulse 75   Ht 5\' 6"  (1.676 m)   Wt 225 lb 12.8 oz (102.4 kg)   BMI 36.45 kg/m  General appearance: A&O x3, cooperative, appears stated age, no distress and moderatly obese.Well-nourished, well-groomed. Pleasant/normal mood and affect. HEENT: [place]/AT, EOMI, MMM, anicteric sclera  Neck: no adenopathy, no carotid bruit - radiated a AS murmur and no JVD Lungs: clear to auscultation bilaterally, normal percussion bilaterally and non-labored Heart: RRR - with ectopy. Normal PMI. Normal S1 & S2. No Gallop/rub.  2/6 mid-peaking c-d SEM @ RUSB --> carotids. 1-2/6 blowing HSM @ apex --> axilla. Abdomen: soft, non-tender; bowel sounds normal; no masses,  no organomegaly;no HJR Extremities: extremities normal, atraumatic, no cyanosis, and edema - trivial Pulses: 2+ and symmetric;  Skin: mobility and turgor normal, no evidence of bleeding or bruising and no lesions noted  Neurologic: Mental status: Alert, oriented, thought content appropriate   Adult ECG Report  Rate: 75 ;  Rhythm: normal  sinus rhythm, premature atrial contractions (PAC) and - ~ with aberrancy.  aVL - now normal.  Normal axis & intervals. ;   Narrative Interpretation: stable EKG>   Other studies Reviewed: Additional studies/ records that were reviewed today include:  Recent Labs:   Lab Results  Component Value Date   CHOL 201 (H) 07/22/2016   HDL 63 07/22/2016   LDLCALC 112 (H) 07/22/2016   TRIG 129 07/22/2016   CHOLHDL 3.2 07/22/2016   Lab Results  Component Value Date   CREATININE 0.84 03/04/2017   BUN 14 03/04/2017   NA 138 03/04/2017   K 3.9 03/04/2017   CL 101 03/04/2017   CO2 24 03/04/2017     ASSESSMENT / PLAN: Problem List Items Addressed This Visit    Mitral regurgitation due to cusp prolapse - moderate MR with mild prolapse - Primary (Chronic)    She is now had another annual follow-up echocardiogram that looks relatively stable. The valve does seem to have worsened at all. She is totally asymptomatic with no heart failure symptoms. Minimal edema. At this rate, I think we can probably go back to every 2 year follow-up echo. Continue to follow murmur.  Just for safety sake, since there is regurgitation and mitral prolapse with structural valve disease, she is borderline criteria for antibiotic coverage for GI or aggressive dental procedures. Probably best to continue prophylactic antibiotics.      Relevant Orders   EKG 12-Lead (Completed)   Lower extremity edema (Chronic)    Probably more related to venous insufficiency as is no other heart failure symptoms. Again recommended support stockings and foot elevation. She is on HCTZ and has not required any additional diuresis.      Heart palpitations    I really don't think she is having to palpitations. What she is feeling is pulsating in her temporal artery. She does have PACs on exam and on EKG. This is probably what she is feeling probably relatively asymptomatic. Not enough to consider beta blocker.      Relevant Orders   EKG  12-Lead (Completed)   Essential hypertension (Chronic)    Excellent control on current medications. We talked again about avoiding dehydration while being on HCTZ.      Relevant Orders   EKG 12-Lead (Completed)   Dyslipidemia (Chronic)    Labs are being monitored by her PCP. Based on having aortic valve disease, her goal LDL  should be less than 100 and she is relatively close on no medications. Continue monitoring with diet Terry/lifestyle modification. As long as she stays relatively close to threshold, would probably avoid treatment, but if lipids worsen, would consider statin      Cough   Aortic valve disorder (Chronic)    Mild aortic stenosis with relatively stable peak and mean gradients from June 14 and now. I suspect that this is going be pretty stable and can be followed up every 2 years at this point. The murmur herself does not seem to be very significant, and I don't hear the regurgitation.  Plan for now would simply be continued risk factor modification for atherosclerotic disease and recheck an echocardiogram in 2 years.      Relevant Orders   EKG 12-Lead (Completed)      Current medicines are reviewed at length with the patient today. (+/- concerns) None The following changes have been made: None  Patient Instructions  NO CHANGE WITH MEDICATION OR TREATMENT    Your physician wants you to follow-up in [place].You will receive a reminder letter in the mail two months in advance. If you don't receive a letter, please call our office to schedule the follow-up appointment.   If you need a refill on your cardiac medications before your next appointment, please call your pharmacy.    Studies Ordered:   Orders Placed This Encounter  Procedures  . EKG 12-Lead      Glenetta Hew, M.D., M.S. Interventional Cardiologist   Pager # [phone] Phone # [phone] [address] [place], [place] [address]

## 2016-08-06 ENCOUNTER — Encounter: Payer: Self-pay | Admitting: Cardiology

## 2016-08-06 NOTE — Assessment & Plan Note (Signed)
Probably more related to venous insufficiency as is no other heart failure symptoms. Again recommended support stockings and foot elevation. She is on HCTZ and has not required any additional diuresis.

## 2016-08-06 NOTE — Assessment & Plan Note (Signed)
Excellent control on current medications. We talked again about avoiding dehydration while being on HCTZ.

## 2016-08-06 NOTE — Assessment & Plan Note (Addendum)
She is now had another annual follow-up echocardiogram that looks relatively stable. The valve does seem to have worsened at all. She is totally asymptomatic with no heart failure symptoms. Minimal edema. At this rate, I think we can probably go back to every 2 year follow-up echo. Continue to follow murmur.  Just for safety sake, since there is regurgitation and mitral prolapse with structural valve disease, she is borderline criteria for antibiotic coverage for GI or aggressive dental procedures. Probably best to continue prophylactic antibiotics.

## 2016-08-06 NOTE — Assessment & Plan Note (Signed)
Labs are being monitored by her PCP. Based on having aortic valve disease, her goal LDL should be less than 100 and she is relatively close on no medications. Continue monitoring with diet Terry/lifestyle modification. As long as she stays relatively close to threshold, would probably avoid treatment, but if lipids worsen, would consider statin

## 2016-08-06 NOTE — Assessment & Plan Note (Signed)
Mild aortic stenosis with relatively stable peak and mean gradients from June 14 and now. I suspect that this is going be pretty stable and can be followed up every 2 years at this point. The murmur herself does not seem to be very significant, and I don't hear the regurgitation.  Plan for now would simply be continued risk factor modification for atherosclerotic disease and recheck an echocardiogram in 2 years.

## 2016-08-06 NOTE — Assessment & Plan Note (Signed)
I really don't think she is having to palpitations. What she is feeling is pulsating in her temporal artery. She does have PACs on exam and on EKG. This is probably what she is feeling probably relatively asymptomatic. Not enough to consider beta blocker.

## 2016-08-14 DIAGNOSIS — Z1231 Encounter for screening mammogram for malignant neoplasm of breast: Secondary | ICD-10-CM | POA: Diagnosis not present

## 2016-10-14 ENCOUNTER — Ambulatory Visit (INDEPENDENT_AMBULATORY_CARE_PROVIDER_SITE_OTHER): Payer: PPO | Admitting: Family Medicine

## 2016-10-14 ENCOUNTER — Encounter: Payer: Self-pay | Admitting: Family Medicine

## 2016-10-14 VITALS — BP 110/68 | HR 85 | Temp 98.1°F | Ht 66.0 in | Wt 224.4 lb

## 2016-10-14 DIAGNOSIS — R4189 Other symptoms and signs involving cognitive functions and awareness: Secondary | ICD-10-CM

## 2016-10-14 DIAGNOSIS — I10 Essential (primary) hypertension: Secondary | ICD-10-CM

## 2016-10-14 DIAGNOSIS — H539 Unspecified visual disturbance: Secondary | ICD-10-CM | POA: Insufficient documentation

## 2016-10-14 NOTE — Assessment & Plan Note (Signed)
Will refer Ophthalmology to get a second opinion

## 2016-10-14 NOTE — Patient Instructions (Addendum)
Please call your insurance to see if they cover behavioral health or psychology If they do then request the names of a few providers in network and call for an appointment.   Try cooking with coconut oil  You will be contacted by Referrals to see the Ophthalmologist   IF you received an x-ray today, you will receive an invoice from [facility]. Please contact [facility] at [phone] with questions or concerns regarding your invoice.   IF you received labwork today, you will receive an invoice from [facility]. Please contact [facility] at [phone] with questions or concerns regarding your invoice.   Our billing staff will not be able to assist you with questions regarding bills from these companies.  You will be contacted with the lab results as soon as they are available. The fastest way to get your results is to activate your My Chart account. Instructions are located on the last page of this paperwork. If you have not heard from [place] regarding the results in 2 weeks, please contact this office.    We recommend that you schedule a mammogram for breast cancer screening. Typically, you do not need a referral to do this. Please contact a local imaging center to schedule your mammogram.  [facility] - [phone]  *ask for the Radiology Department [facility] - [phone] or [phone]  [facility] - [phone] [place] [phone] [facility] - [phone]  *ask for the [facility] - [phone]  *ask for the Radiology Department [facility] - [phone]  *ask for the [facility] - [phone]

## 2016-10-14 NOTE — Assessment & Plan Note (Addendum)
BP well controlled, continue current medications

## 2016-10-14 NOTE — Progress Notes (Signed)
Chief Complaint  Patient presents with  .  diabetes    check for dm- fam history    HPI   Pt reports that she feels like there is a shade over her eyes She states that she cannot see like she used to She states that she feels like there is pressure Her last visual acuity test was 07/22/16 20/20 Her last eye exam was July 20, 2016 She reports that she does not feel like she can focus on the print.    She reports that she has difficulty with memory and recall She reports that she is on the computer and everything seems to take longer She reports that she has difficulty with maintaining focus and if distracted she cannot get back on track.  She reports that she needs to sleep better.   Past Medical History:  Diagnosis Date  . Aortic stenosis, mild 07/2012   Mild AS with Mod MR -(Mean/Peak Gradient 20 mmHg/34 mmHg).  . Asthma   . Bilateral bunions    Bunionectomies performed  . Bronchitis, chronic ([place])   . Heart disease   . Hyperlipidemia   . Hypertension    Good control  . Inguinodynia    Bilateral groin pain  . Lower extremity edema    Chronic. Venous Duplex 11/06/11 SUMMARY: 1) Bilateral Lower Extremities: No evidence of DVT or thrombophlebitis.  2) Right Common Femoral Vein: Demonstrated mild valvular insufficiency with a greater than (1) sec of duration. Mildly abnormal LE Venous duplex Doppler.  . Mitral regurgitation due to cusp prolapse 07/2012   Mild AMVL prolapse, Mod MR  . Obesity   . Palpitations    Relatively well controlled  . Scoliosis    DG Chest 2 View x-ray on 07/30/11 by Dr. Everlene Farrier shows a scoliosis.    Current Outpatient Prescriptions  Medication Sig Dispense Refill  . aspirin EC 81 MG tablet Take 81 mg by mouth daily.    . calcium-vitamin D (OSCAL WITH D) 250-125 MG-UNIT tablet Take by mouth.    . Cholecalciferol (VITAMIN D-3) 1000 UNITS CAPS Take 1 capsule by mouth daily.    [name] Kitchen estradiol (ESTRACE) 2 MG tablet Take 2 mg by mouth daily.    .  hydrochlorothiazide (HYDRODIURIL) 25 MG tablet Take 1 tablet (25 mg total) by mouth daily. 90 tablet 1  . lisinopril (PRINIVIL,ZESTRIL) 20 MG tablet Take 1 tablet (20 mg total) by mouth daily. 90 tablet 1  . amoxicillin (AMOXIL) 500 MG capsule Take 500 mg by mouth 3 (three) times daily.    . traMADol (ULTRAM) 50 MG tablet Take 1 tablet (50 mg total) by mouth every 6 (six) hours as needed. Reported on 08/20/2015 (Patient not taking: Reported on 10/14/2016) 30 tablet 0   No current facility-administered medications for this visit.     Allergies: No Known Allergies  Past Surgical History:  Procedure Laterality Date  . ABDOMINAL HYSTERECTOMY    . ANKLE ARTHROSCOPY Left   . BUNIONECTOMY Bilateral   . KNEE ARTHROSCOPY Right   . Left knee open surgery    . TOTAL ABDOMINAL HYSTERECTOMY    . TRANSTHORACIC ECHOCARDIOGRAM  07/2012; 01/2013   a) 4/'14: mild-mod AS, Mod AI (mean/peak gradient: 18 mmHg / 31 mmhg).  Mild MVP w/ Mod MR, no MS.  Mildly elevated PAP (~37 mmHg) w/ Mod TR. EF 55-60%. GR 1 DD.;; b). 01/2013: EF 60-65%. No RWMA. Gr  DD. Mild AS (M / P Gradient 20 mmHg/34 mmHg). Mild AMVL Prolapse w/ Mod MR. Mod  LA dilation. Mod TR.  [name] Kitchen TRANSTHORACIC ECHOCARDIOGRAM  01/2015; 01/2016   a) Normal LV size and function. GR 1 DD. Moderate LA Dilation. Calcified aortic valve with mild AS, moderate (2+) AI. Restricted posterior MV leaflet movement w/ Mod MR. Mode TR with mod elevated PA pressures (45 mmHg);; b) Relatively stable. Moderate concentric LVH. Moderate AI with mild AS (Mean-PeaK Gradient: 19 / 36 mmHg). Mild MR. Mod LA dilation. PAP ~40 mmHg -> plan follow-up 2020.    Social History   Social History  . Marital status: Married    Spouse name: Herbie [place]  . Number of children: 2  . Years of education: N/A   Occupational History  . 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 Smoking status: Never Smoker  . Smokeless tobacco: Never Used  . Alcohol use  No  . Drug use: No  . Sexual activity: No   Other Topics Concern  . None   Social History Narrative   Lives with her husband.  Their children live nearby.    ROS Review of Systems See HPI Constitution: No fevers or chills No malaise No diaphoresis Skin: No rash or itching Eyes: see hpi GU: no dysuria or hematuria Neuro: no dizziness or headaches  Objective: Vitals:   10/14/16 1024  BP: 110/68  Pulse: 85  Temp: 98.1 F (36.7 C)  TempSrc: Oral  SpO2: 97%  Weight: 224 lb 6.4 oz (101.8 kg)  Height: 5\' 6"  (1.676 m)    Physical Exam  Constitutional: She is oriented to [place], place, and time. She appears well-developed and well-nourished.  HENT:  Head: Normocephalic and atraumatic.  Eyes: Conjunctivae and EOM are normal. Pupils are equal, round, and reactive to light. Right eye exhibits no discharge.  Unable to get a good fundoscopic exam due to dark pupils. Was able to see the vessels and there was no nicking  Cardiovascular: Normal rate, regular rhythm and normal heart sounds.   Pulmonary/Chest: Effort normal and breath sounds normal. No respiratory distress. She has no wheezes.  Neurological: She is alert and oriented to [place], place, and time.   Lab Results  Component Value Date   HGBA1C 5.6 10/14/2016   Lab Results  Component Value Date   WBC 3.6 10/14/2016   HGB 13.1 10/14/2016   HCT 39.5 10/14/2016   MCV 91 10/14/2016   PLT 268 10/14/2016      Assessment and Plan Lisa Miles was seen today for  diabetes.  Diagnoses and all orders for this visit:  Visual changes-  Recently had eye exam 3 MONTHS AGO  Advised pt to see ophthalmology  -     Ambulatory referral to Ophthalmology -     CBC -     Hemoglobin A1c  Cognitive decline- concerning for deficits with processing speed and attention -     Ambulatory referral to Psychology  Essential hypertension- bp well controlled   A total of 25 minutes were spent face-to-face with the patient during this  encounter and over half of that time was spent on counseling and coordination of care.    [place]

## 2016-10-15 LAB — CBC
Hematocrit: 39.5 % (ref 34.0–46.6)
Hemoglobin: 13.1 g/dL (ref 11.1–15.9)
MCH: 30.3 pg (ref 26.6–33.0)
MCHC: 33.2 g/dL (ref 31.5–35.7)
MCV: 91 fL (ref 79–97)
Platelets: 268 10*3/uL (ref 150–379)
RBC: 4.33 x10E6/uL (ref 3.77–5.28)
RDW: 13.9 % (ref 12.3–15.4)
WBC: 3.6 10*3/uL (ref 3.4–10.8)

## 2016-10-15 LAB — HEMOGLOBIN A1C
Est. average glucose Bld gHb Est-mCnc: 114 mg/dL
Hgb A1c MFr Bld: 5.6 % (ref 4.8–5.6)

## 2016-10-28 IMAGING — US US SOFT TISSUE HEAD/NECK
1 series · 13 of 25 positions shown · non-contrast
Comparison: Prior thyroid ultrasound 03/31/2013

CLINICAL DATA: 63-year-old female with multiple bilateral thyroid
nodules. Annual followup evaluation.

EXAM:
THYROID ULTRASOUND
TECHNIQUE: Ultrasound examination of the thyroid gland and adjacent soft
tissues was performed.

[Series 1: us soft tissue head/neck · 0.06mm/px · 13 of 39 slices shown]
[im 1/39]
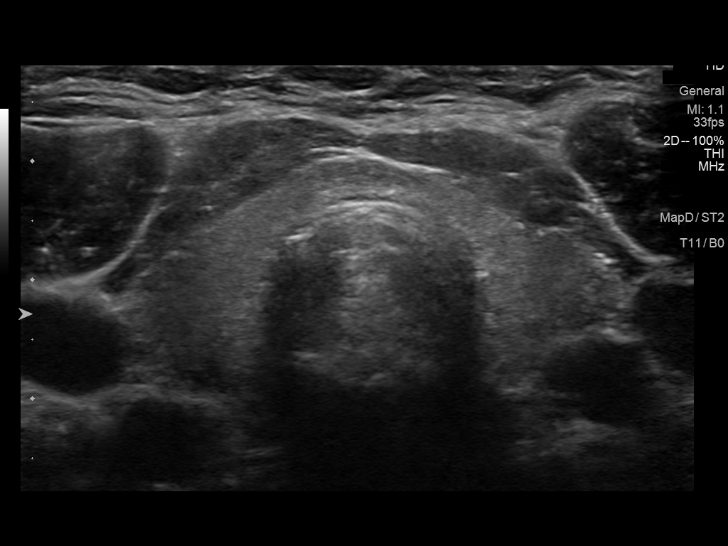
[im 4/39]
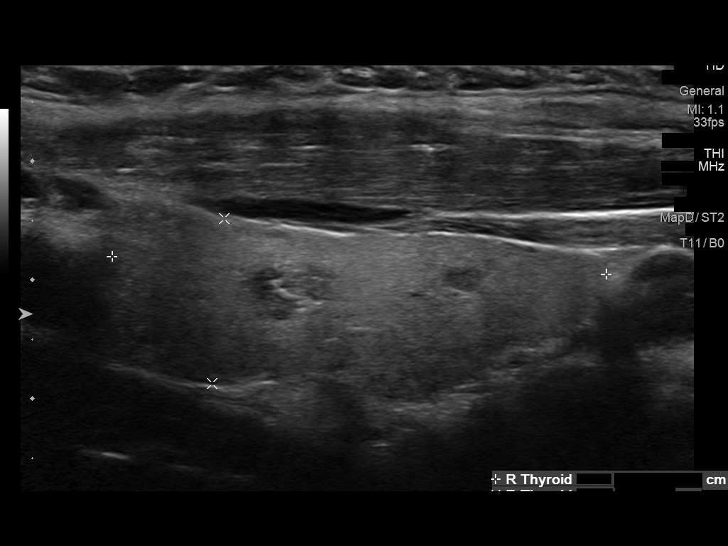
[im 7/39]
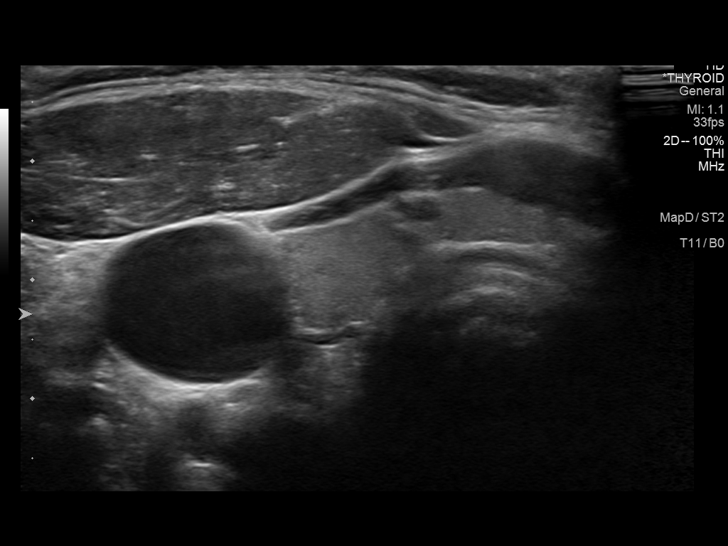
[im 10/39]
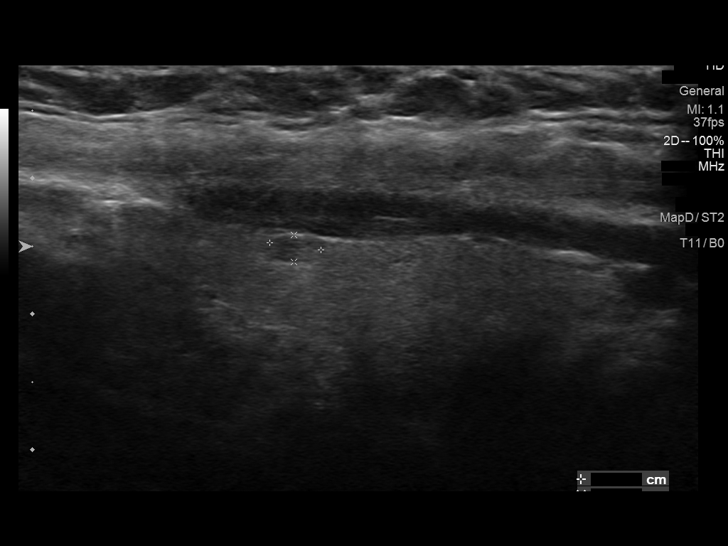
[im 13/39]
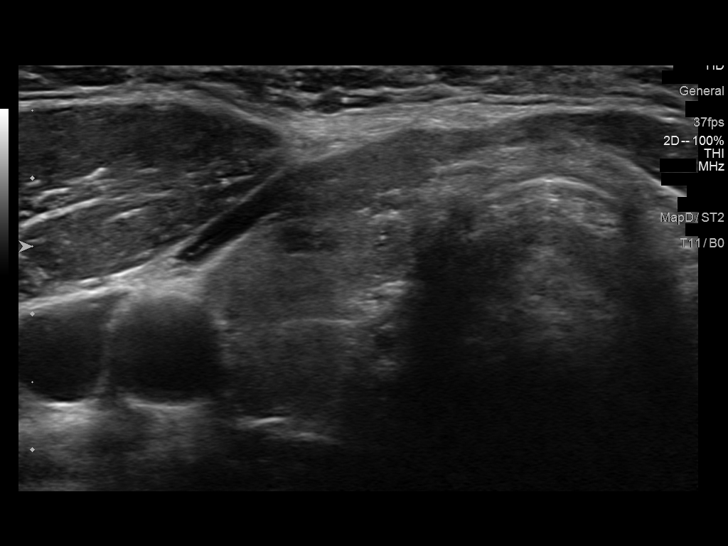
[im 16/39]
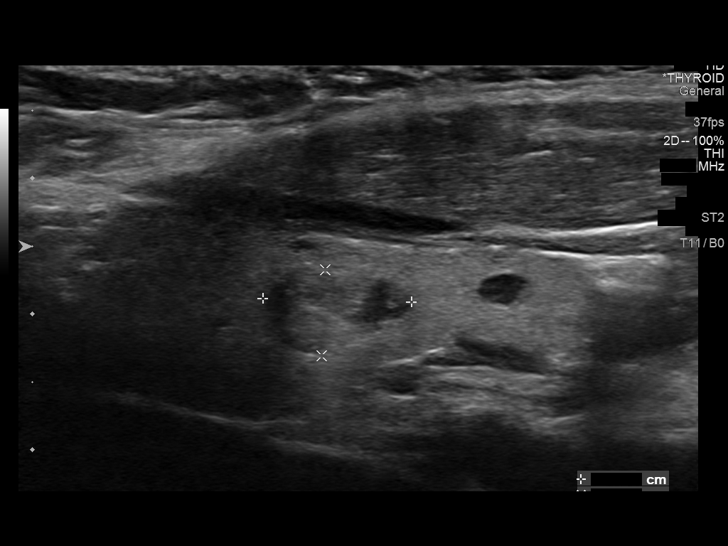
[im 20/39]
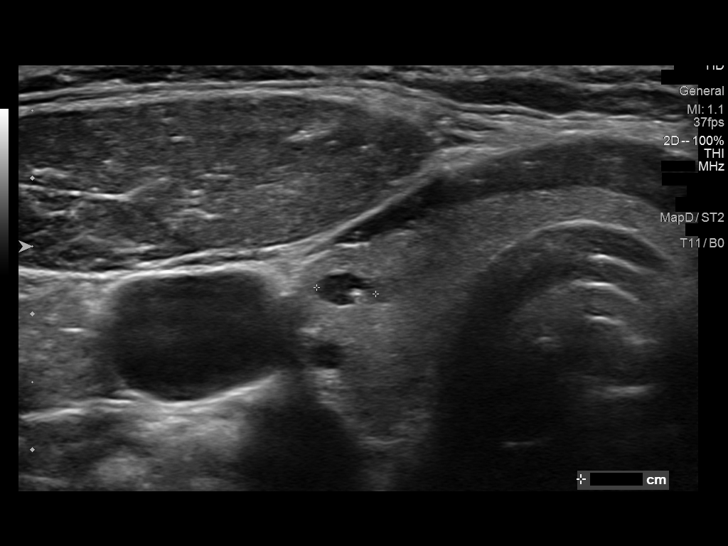
[im 23/39]
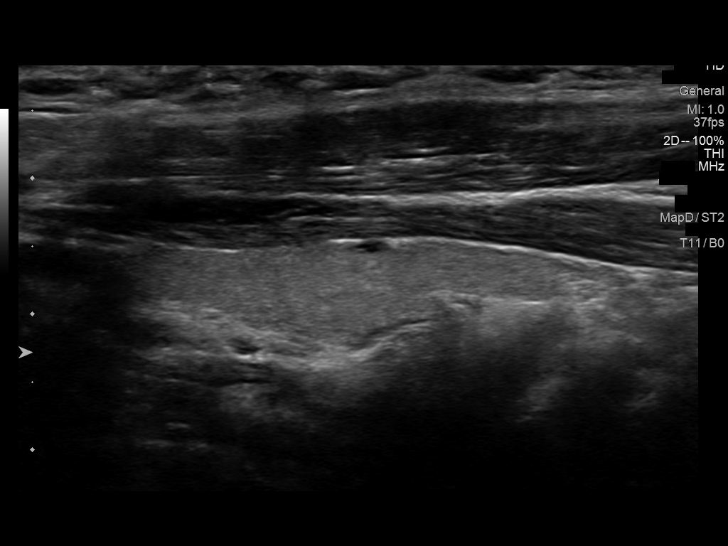
[im 26/39]
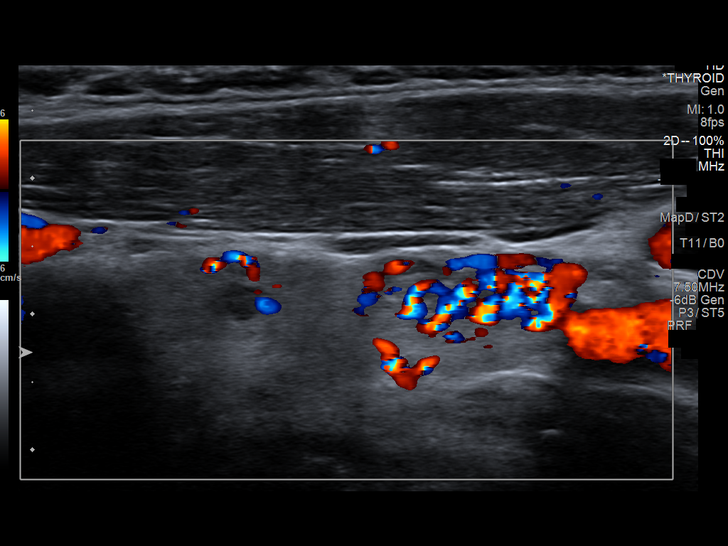
[im 29/39]
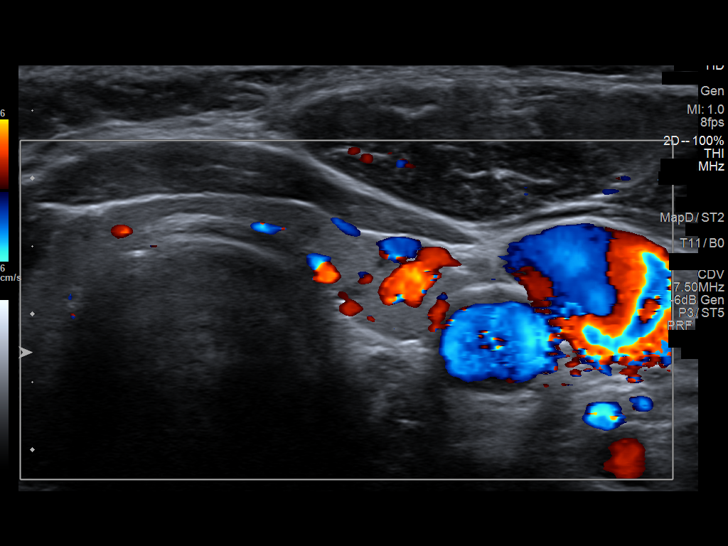
[im 32/39]
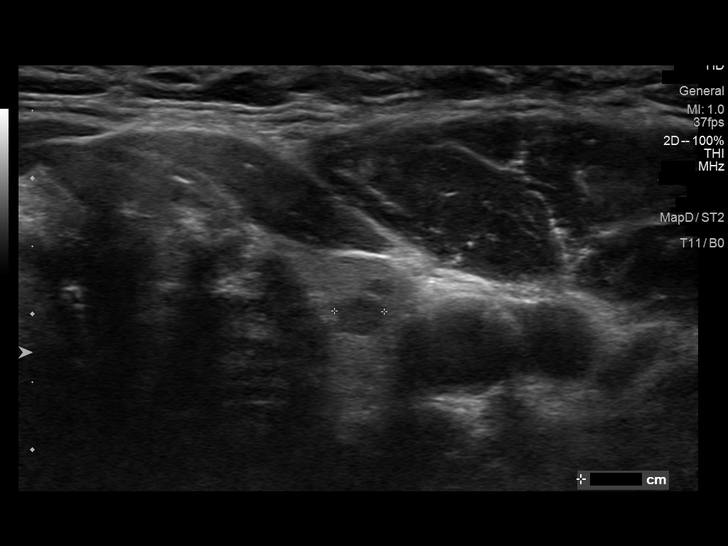
[im 35/39]
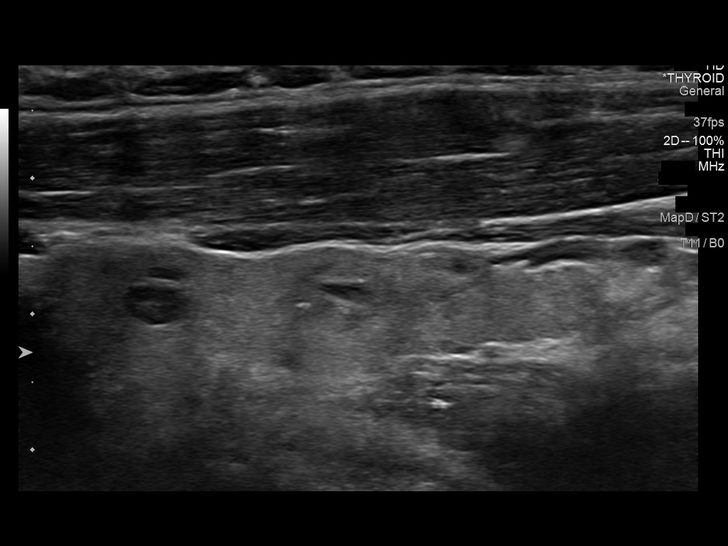
[im 39/39]
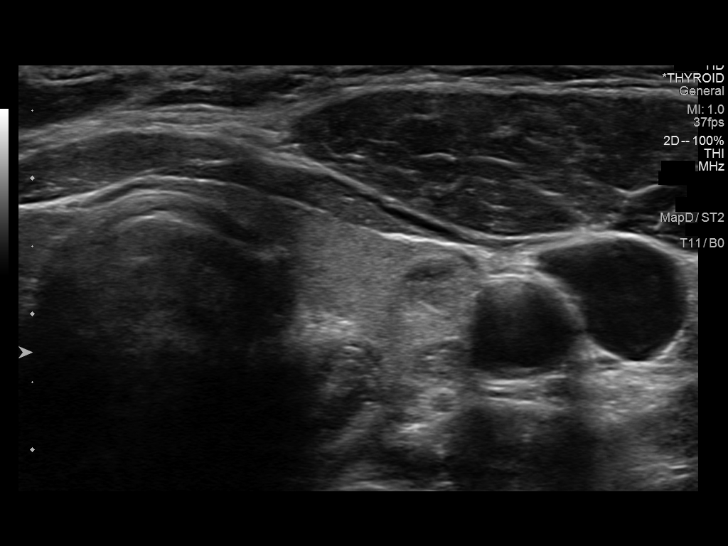

[13 of 25 positions shown; findings below may reference images not displayed]

FINDINGS: Right thyroid lobe

Measurements: 4.2 x 1.4 x 1.5 cm. Multiple right-sided thyroid
nodules as follows:

1. 4 mm hypoechoic solid nodule in the mid gland.
2. 11 x 7 x 6 mm complex predominantly solid nodule in the lateral
aspect of the mid to lower gland demonstrates minimal interval
change compared to 9 x 7 x 6 mm previously.
3. 4 mm hypoechoic nodule with internal colloid artifact in the
inferior aspect of the gland. The sonographic appearance is most
consistent with a benign colloid nodule.
Left thyroid lobe

Measurements: 4.0 x 1.3 x 1.5 cm.  Thyroid nodules as follows:

1. 5 mm hypoechoic solid nodule in the upper gland.
2. 16 x 7 x 10 mm mildly complex nodule in the mid gland.
Previously, this measured 13 x 8 x 10 mm.
Isthmus

Thickness: 0.2 cm.  No nodules visualized.

Lymphadenopathy

None visualized.
IMPRESSION: 1. Slight interval growth of the dominant thyroid nodules
bilaterally. The 1.6 cm predominantly solid left thyroid nodule now
meets consensus criteria for ultrasound-guided FNA biopsy.
Ultrasound-guided fine needle aspiration should be considered, as
per the consensus statement: Management of Thyroid Nodules Detected
at US: Society of Radiologists in Ultrasound Consensus Conference
2. The remaining sub centimeter nodules are essentially unchanged.

## 2016-11-03 DIAGNOSIS — H2513 Age-related nuclear cataract, bilateral: Secondary | ICD-10-CM | POA: Diagnosis not present

## 2016-11-03 DIAGNOSIS — H01022 Squamous blepharitis right lower eyelid: Secondary | ICD-10-CM | POA: Diagnosis not present

## 2016-11-03 DIAGNOSIS — H01021 Squamous blepharitis right upper eyelid: Secondary | ICD-10-CM | POA: Diagnosis not present

## 2016-11-03 DIAGNOSIS — H01024 Squamous blepharitis left upper eyelid: Secondary | ICD-10-CM | POA: Diagnosis not present

## 2016-11-03 DIAGNOSIS — H25013 Cortical age-related cataract, bilateral: Secondary | ICD-10-CM | POA: Diagnosis not present

## 2016-11-03 DIAGNOSIS — H01025 Squamous blepharitis left lower eyelid: Secondary | ICD-10-CM | POA: Diagnosis not present

## 2016-11-27 ENCOUNTER — Other Ambulatory Visit: Payer: Self-pay | Admitting: Cardiology

## 2017-01-26 DIAGNOSIS — Z86010 Personal history of colonic polyps: Secondary | ICD-10-CM | POA: Diagnosis not present

## 2017-01-26 DIAGNOSIS — K5730 Diverticulosis of large intestine without perforation or abscess without bleeding: Secondary | ICD-10-CM | POA: Diagnosis not present

## 2017-01-26 DIAGNOSIS — Z1211 Encounter for screening for malignant neoplasm of colon: Secondary | ICD-10-CM | POA: Diagnosis not present

## 2017-02-10 DIAGNOSIS — D125 Benign neoplasm of sigmoid colon: Secondary | ICD-10-CM | POA: Diagnosis not present

## 2017-02-10 DIAGNOSIS — K5730 Diverticulosis of large intestine without perforation or abscess without bleeding: Secondary | ICD-10-CM | POA: Diagnosis not present

## 2017-02-10 DIAGNOSIS — Z86010 Personal history of colonic polyps: Secondary | ICD-10-CM | POA: Diagnosis not present

## 2017-02-10 DIAGNOSIS — K635 Polyp of colon: Secondary | ICD-10-CM | POA: Diagnosis not present

## 2017-02-10 DIAGNOSIS — Z1211 Encounter for screening for malignant neoplasm of colon: Secondary | ICD-10-CM | POA: Diagnosis not present

## 2017-02-17 NOTE — Progress Notes (Signed)
Chief Complaint  Patient presents with  . Headache    x past 5 days not taking any otc medication for ha's, pt says she hasn't been feeling well  . eyes are tired and fatigued    x 2 1/2 months, eye sight is failing- per eye doc pt has a slow growing cataract  . faintness    feels like she will fall over when she stands x 2 1/2 months    HPI  She reports that she has worsening vision and feels like her eyes feels she is straining to see. She also feels like her eye lid is weak She had an eye exam March 2018 and was told she has cataracts but reports that she feels like she has pressure in the eyes and can't focus on print.   She reports that she has been feeling in her head like she is whoozy She denies taking any antihistamines She reports that she has been having a headache for 5 days She does not typically get headaches She reports that she gets headache in the crown of her head that is dull but can be sharp.  She reports that yesterday she had a break from the headache She reports that she does not wake up with the headache and it does not wake her from her sleep.      4 review of systems  Past Medical History:  Diagnosis Date  . Aortic stenosis, mild 07/2012   Mild AS with Mod MR -(Mean/Peak Gradient 20 mmHg/34 mmHg).  . Asthma   . Bilateral bunions    Bunionectomies performed  . Bronchitis, chronic ([place])   . Heart disease   . Hyperlipidemia   . Hypertension    Good control  . Inguinodynia    Bilateral groin pain  . Lower extremity edema    Chronic. Venous Duplex 11/06/11 SUMMARY: 1) Bilateral Lower Extremities: No evidence of DVT or thrombophlebitis.  2) Right Common Femoral Vein: Demonstrated mild valvular insufficiency with a greater than (1) sec of duration. Mildly abnormal LE Venous duplex Doppler.  . Mitral regurgitation due to cusp prolapse 07/2012   Mild AMVL prolapse, Mod MR  . Obesity   . Palpitations    Relatively well controlled  . Scoliosis    DG  Chest 2 View x-ray on 07/30/11 by Dr. Everlene Farrier shows a scoliosis.    Current Outpatient Medications  Medication Sig Dispense Refill  . aspirin EC 81 MG tablet Take 81 mg by mouth daily.    . Cholecalciferol (VITAMIN D-3) 1000 UNITS CAPS Take 1 capsule by mouth daily.    [name] Kitchen estradiol (ESTRACE) 2 MG tablet Take 2 mg by mouth daily.    . hydrochlorothiazide (HYDRODIURIL) 25 MG tablet TAKE 1 TABLET(25 MG) BY MOUTH DAILY 90 tablet 1  . lisinopril (PRINIVIL,ZESTRIL) 20 MG tablet TAKE 1 TABLET(20 MG) BY MOUTH DAILY 90 tablet 1  . amoxicillin (AMOXIL) 500 MG capsule Take 500 mg by mouth 3 (three) times daily.    . calcium-vitamin D (OSCAL WITH D) 250-125 MG-UNIT tablet Take by mouth.    [name] Kitchen ibuprofen (ADVIL,MOTRIN) 600 MG tablet Take 1 tablet (600 mg total) by mouth every 8 (eight) hours as needed. 30 tablet 0  . traMADol (ULTRAM) 50 MG tablet Take 1 tablet (50 mg total) by mouth every 6 (six) hours as needed. Reported on 08/20/2015 (Patient not taking: Reported on 10/14/2016) 30 tablet 0   No current facility-administered medications for this visit.     Allergies: No Known  Allergies  Past Surgical History:  Procedure Laterality Date  . ABDOMINAL HYSTERECTOMY    . ANKLE ARTHROSCOPY Left   . BUNIONECTOMY Bilateral   . KNEE ARTHROSCOPY Right   . Left knee open surgery    . TOTAL ABDOMINAL HYSTERECTOMY    . TRANSTHORACIC ECHOCARDIOGRAM  07/2012; 01/2013   a) 4/'14: mild-mod AS, Mod AI (mean/peak gradient: 18 mmHg / 31 mmhg).  Mild MVP w/ Mod MR, no MS.  Mildly elevated PAP (~37 mmHg) w/ Mod TR. EF 55-60%. GR 1 DD.;; b). 01/2013: EF 60-65%. No RWMA. Gr  DD. Mild AS (M / P Gradient 20 mmHg/34 mmHg). Mild AMVL Prolapse w/ Mod MR. Mod LA dilation. Mod TR.  [name] Kitchen TRANSTHORACIC ECHOCARDIOGRAM  01/2015; 01/2016   a) Normal LV size and function. GR 1 DD. Moderate LA Dilation. Calcified aortic valve with mild AS, moderate (2+) AI. Restricted posterior MV leaflet movement w/ Mod MR. Mode TR with mod elevated PA pressures  (45 mmHg);; b) Relatively stable. Moderate concentric LVH. Moderate AI with mild AS (Mean-PeaK Gradient: 19 / 36 mmHg). Mild MR. Mod LA dilation. PAP ~40 mmHg -> plan follow-up 2020.    Social History   Socioeconomic History  . Marital status: Married    Spouse name: Herbie Baltimore  . Number of children: 2  . Years of education: None  . Highest education level: None  Social Needs  . Financial resource strain: None  . Food insecurity - worry: None  . Food insecurity - inability: None  . Transportation needs - medical: None  . Transportation needs - non-medical: None  Occupational History  . Occupation: production    Employer: [employer]    Comment: check printing/shipping  Tobacco Use  . Smoking status: Never Smoker  . Smokeless tobacco: Never Used  Substance and Sexual Activity  . Alcohol use: No  . Drug use: No  . Sexual activity: No  Other Topics Concern  . None  Social History Narrative   Lives with her husband.  Their children live nearby.    Family History  Problem Relation Age of Onset  . Arthritis Mother   . Hyperlipidemia Mother   . Diabetes Mother   . Gout Mother   . Hypertension Mother   . Cancer Father        GI cancer  . Diabetes Maternal Grandfather   . Hypertension Brother   . Diabetes Brother   . Hypertension Brother      ROS Review of Systems See HPI Constitution: No fevers or chills No malaise No diaphoresis Skin: No rash or itching Eyes: no blurry vision, no double vision GU: no dysuria or hematuria Neuro: see hpi GI: no nausea or vomiting  Objective: Vitals:   02/18/17 1058  BP: 134/70  Pulse: (!) 118  Resp: 18  Temp: 99.4 F (37.4 C)  TempSrc: Oral  SpO2: 98%  Weight: 220 lb 6.4 oz (100 kg)  Height: 5\' 6"  (1.676 m)    Physical Exam   General: alert, oriented, in NAD Head: normocephalic, atraumatic, + frontal and maxillary sinus tenderness Eyes: EOM intact, no scleral icterus or conjunctival injection Ears: TM clear  bilaterally Nose: mucosa nonerythematous, nonedematous Throat: no pharyngeal exudate or erythema Lymph: no posterior auricular, submental or cervical lymph adenopathy Heart: normal rate, normal sinus rhythm, no murmurs Lungs: clear to auscultation bilaterally, no wheezing   CRANIAL NERVES: CN II: Visual fields are full to confrontation. Fundoscopic exam is normal with sharp discs and no vascular changes. Pupils are round  equal and briskly reactive to light. CN III, IV, VI: extraocular movement are normal. No ptosis. CN V: Facial sensation is intact to pinprick in all 3 divisions bilaterally. Corneal responses are intact.  CN VII: Face is symmetric with normal eye closure and smile. CN VIII: Hearing is normal to rubbing fingers CN IX, X: Palate elevates symmetrically. Phonation is normal. CN XI: Head turning and shoulder shrug are intact CN XII: Tongue is midline with normal movements and no atrophy.  Assessment and Plan Urvi was seen today for headache, eyes are tired and fatigued and faintness.  Diagnoses and all orders for this visit:  Sinus headache -     Cancel: CT MAXILLOFACIAL W CONTRAST; Future -     CT MAXILLOFACIAL W CONTRAST; Future  Vision changes -     Cancel: CT MAXILLOFACIAL W CONTRAST; Future -     CT MAXILLOFACIAL W CONTRAST; Future  Lightheadedness -     Cancel: CT MAXILLOFACIAL W CONTRAST; Future -     Cancel: POCT glycosylated hemoglobin (Hb A1C) -     Basic metabolic panel; Future -     POCT glucose (manual entry) -     CT MAXILLOFACIAL W CONTRAST; Future  Acute intractable tension-type headache -     Cancel: CT Maxillofacial W/Cm; Future -     Basic metabolic panel; Future -     CT MAXILLOFACIAL W CONTRAST; Future  Postnasal drip  Other orders -     Cancel: Flu Vaccine QUAD 36+ mos IM -     Cancel: MM Digital Screening; Future -     ibuprofen (ADVIL,MOTRIN) 600 MG tablet; Take 1 tablet (600 mg total) by mouth every 8 (eight) hours as  needed.  Advised pt to follow up for CT of sinus Ibuprofen for pain Work note given No cranial nerve deficits so outpatient work up advised    [employer]

## 2017-02-18 ENCOUNTER — Encounter: Payer: Self-pay | Admitting: Family Medicine

## 2017-02-18 ENCOUNTER — Ambulatory Visit (INDEPENDENT_AMBULATORY_CARE_PROVIDER_SITE_OTHER): Payer: PPO | Admitting: Family Medicine

## 2017-02-18 VITALS — BP 134/70 | HR 118 | Temp 99.4°F | Resp 18 | Ht 66.0 in | Wt 220.4 lb

## 2017-02-18 DIAGNOSIS — R0982 Postnasal drip: Secondary | ICD-10-CM | POA: Diagnosis not present

## 2017-02-18 DIAGNOSIS — R42 Dizziness and giddiness: Secondary | ICD-10-CM | POA: Diagnosis not present

## 2017-02-18 DIAGNOSIS — G44201 Tension-type headache, unspecified, intractable: Secondary | ICD-10-CM | POA: Diagnosis not present

## 2017-02-18 DIAGNOSIS — H539 Unspecified visual disturbance: Secondary | ICD-10-CM

## 2017-02-18 DIAGNOSIS — R51 Headache: Secondary | ICD-10-CM | POA: Diagnosis not present

## 2017-02-18 DIAGNOSIS — R519 Headache, unspecified: Secondary | ICD-10-CM

## 2017-02-18 MED ORDER — IBUPROFEN 600 MG PO TABS: 600.0000 mg | ORAL_TABLET | Freq: Three times a day (TID) | ORAL | 0 refills | Status: DC | PRN

## 2017-02-18 NOTE — Patient Instructions (Addendum)
Claritin for postnasal drip  You will get a kidney function test before the CT scan to check your kidneys.  IF you received an x-ray today, you will receive an invoice from [facility]. Please contact [facility] at [phone] with questions or concerns regarding your invoice.   IF you received labwork today, you will receive an invoice from [place]. Please contact [facility] at [phone] with questions or concerns regarding your invoice.   Our billing staff will not be able to assist you with questions regarding bills from these companies.  You will be contacted with the lab results as soon as they are available. The fastest way to get your results is to activate your My Chart account. Instructions are located on the last page of this paperwork. If you have not heard from [place] regarding the results in 2 weeks, please contact this office.     Sinus Headache A sinus headache occurs when the paranasal sinuses become clogged or swollen. Paranasal sinuses are air pockets within the bones of the face. Sinus headaches can range from mild to severe. What are the causes? A sinus headache can result from various conditions that affect the sinuses, such as:  Colds.  Sinus infections.  Allergies.  What are the signs or symptoms? The main symptom of this condition is a headache that may feel like pain or pressure in the face, forehead, ears, or upper teeth. People who have a sinus headache often have other symptoms, such as:  Congested or runny nose.  Fever.  Inability to smell.  Weather changes can make symptoms worse. How is this diagnosed? This condition may be diagnosed based on:  A physical exam and medical history.  Imaging tests, such as a CT scan and MRI, to check for problems with the sinuses.  A specialist may look into the sinuses with a tool that has a camera (endoscopy).  How is this treated? Treatment for this condition depends on the  cause.  Sinus pain that is caused by a sinus infection may be treated with antibiotic medicine.  Sinus pain that is caused by allergies may be helped by allergy medicines (antihistamines) and medicated nasal sprays.  Sinus pain that is caused by congestion may be helped by flushing the nose and sinuses with saline solution.  Follow these instructions at home:  Take medicines only as directed by your health care provider.  If you were prescribed an antibiotic medicine, finish all of it even if you start to feel better.  If you have congestion, use a nasal spray to help reduce pressure.  If directed, apply a warm, moist washcloth to your face to help relieve pain. Contact a health care provider if:  You have headaches more than one time each week.  You have sensitivity to light or sound.  You have a fever.  You feel sick to your stomach (nauseous) or you throw up (vomit).  Your headaches do not get better with treatment. Many people think that they have a sinus headache when they actually have migraines or tension headaches. Get help right away if:  You have vision problems.  You have sudden, severe pain in your face or head.  You have a seizure.  You are confused.  You have a stiff neck. This information is not intended to replace advice given to you by your health care provider. Make sure you discuss any questions you have with your health care provider. Document Released: 05/21/2004 Document Revised: 12/08/2015 Document Reviewed: 04/09/2014 Elsevier Interactive  Patient Education  [employer].

## 2017-02-23 ENCOUNTER — Telehealth: Payer: Self-pay | Admitting: Family Medicine

## 2017-02-23 NOTE — Telephone Encounter (Signed)
Pt called asking about her referral for a CT was working on prior [place] for insurance but the notes are incomplete once the notes are done I will send the prior auth out.[name] Kitchen  Please advise

## 2017-02-24 NOTE — Telephone Encounter (Signed)
Pt called and left vm for referrals checking on CT again. She also called back and was speaking with Angie and was advised that we are waiting on the prior auth before she can get this CT done. We will need ov notes closed for this. Pt also left in message that she was waiting on someone to set up a BMP as well. Please advise. Thanks!

## 2017-03-04 ENCOUNTER — Ambulatory Visit (INDEPENDENT_AMBULATORY_CARE_PROVIDER_SITE_OTHER): Payer: PPO

## 2017-03-04 ENCOUNTER — Ambulatory Visit (INDEPENDENT_AMBULATORY_CARE_PROVIDER_SITE_OTHER): Payer: No Typology Code available for payment source | Admitting: Urgent Care

## 2017-03-04 VITALS — BP 144/76 | HR 112 | Temp 99.6°F | Resp 18 | Ht 66.0 in | Wt 225.0 lb

## 2017-03-04 DIAGNOSIS — R51 Headache: Secondary | ICD-10-CM

## 2017-03-04 DIAGNOSIS — R059 Cough, unspecified: Secondary | ICD-10-CM

## 2017-03-04 DIAGNOSIS — R05 Cough: Secondary | ICD-10-CM

## 2017-03-04 DIAGNOSIS — R0602 Shortness of breath: Secondary | ICD-10-CM

## 2017-03-04 DIAGNOSIS — R519 Headache, unspecified: Secondary | ICD-10-CM

## 2017-03-04 MED ORDER — ALBUTEROL SULFATE HFA 108 (90 BASE) MCG/ACT IN AERS: 2.0000 | INHALATION_SPRAY | Freq: Four times a day (QID) | RESPIRATORY_TRACT | 1 refills | Status: DC | PRN

## 2017-03-04 MED ORDER — CETIRIZINE HCL 10 MG PO TABS: 10.0000 mg | ORAL_TABLET | Freq: Every day | ORAL | 11 refills | Status: DC

## 2017-03-04 MED ORDER — HYDROCODONE-HOMATROPINE 5-1.5 MG/5ML PO SYRP: 5.0000 mL | ORAL_SOLUTION | Freq: Every evening | ORAL | 0 refills | Status: DC | PRN

## 2017-03-04 MED ORDER — PREDNISONE 20 MG PO TABS: ORAL_TABLET | ORAL | 0 refills | Status: DC

## 2017-03-04 NOTE — Progress Notes (Signed)
  MRN: [id number] DOB: [birth year]  Subjective:   Lisa Miles is a 66 y.o. female presenting for chief complaint of Cough (x 2 wks); Sinusitis (x 2 wks); and Sore Throat (x 2 wks)  Reports 2+ week history of productive cough, sinus congestion, mild sinus pain, heaviness in her eyes. Cough elicits chest pain, shob, wheezing, constant tickle in her throat. Claritin, Mucinex, APAP with minimal relief. Denies fever, ear pain, n/v, abdominal pain, rashes. Denies smoking cigarettes. Has a history of asthma, used to use an albuterol inhaler ~10 years ago. She is currently being worked up by Dr. Nolon Rod for her headaches, has a maxillofacial CT scheduled, needs a bmet.  Lisa Miles has a current medication list which includes the following prescription(s): aspirin ec, calcium-vitamin d, vitamin d-3, estradiol, hydrochlorothiazide, ibuprofen, lisinopril, and tramadol. Also has No Known Allergies.  Lisa Miles  has a past medical history of Aortic stenosis, mild (07/2012), Asthma, Bilateral bunions, Bronchitis, chronic ([place]), Heart disease, Hyperlipidemia, Hypertension, Inguinodynia, Lower extremity edema, Mitral regurgitation due to cusp prolapse (07/2012), Obesity, Palpitations, and Scoliosis. Also  has a past surgical history that includes Bunionectomy (Bilateral); Knee arthroscopy (Right); Abdominal hysterectomy; Ankle arthroscopy (Left); Left knee open surgery; Total abdominal hysterectomy; transthoracic echocardiogram (07/2012; 01/2013); and transthoracic echocardiogram (01/2015; 01/2016).  Objective:   Vitals: BP (!) 144/76   Pulse (!) 112   Temp 99.6 F (37.6 C) (Oral)   Resp 18   Ht 5\' 6"  (1.676 m)   Wt 225 lb (102.1 kg)   SpO2 98%   BMI 36.32 kg/m   Physical Exam  Constitutional: She is oriented to [place], place, and time. She appears well-developed and well-nourished.  HENT:  TM's flat but intact bilaterally, no effusions or erythema. Nasal turbinates dry, nasal passages patent. Oropharynx with  moderate post-nasal drainage, mucous membranes moist.  Cardiovascular: Normal rate, regular rhythm and intact distal pulses. Exam reveals no gallop and no friction rub.  No murmur heard. Pulmonary/Chest: No respiratory distress. She has no wheezes. She has no rales.  Neurological: She is alert and oriented to [place], place, and time.   Dg Chest 2 View  Result Date: 03/04/2017 CLINICAL DATA:  cough, shortness of breath x over 2 weeks. EXAM: CHEST  2 VIEW COMPARISON:  Chest x-ray dated 10/17/2014. FINDINGS: Heart size and mediastinal contours are within normal limits. Lungs are clear. No pleural effusion or pneumothorax seen. Mild degenerative change within the midthoracic spine. No acute or suspicious osseous finding. IMPRESSION: No active cardiopulmonary disease. No evidence of pneumonia or pulmonary edema. Electronically Signed   By: Franki Cabot M.D.   On: 03/04/2017 17:52   Assessment and Plan :   1. Generalized headaches - Basic metabolic panel pending.  2. Cough 3. Shortness of breath - Will manage with short steroid course. Maintain loratadine as prescribed by Dr. Nolon Rod, add Zyrtec. Hydrate well. Use Hycodan for cough suppression. Use albuterol inhaler for shob, wheezing. Counseled patient on potential for adverse effects with medications prescribed today, patient verbalized understanding. Return-to-clinic precautions discussed, patient verbalized understanding.   Jaynee Eagles, PA-C [facility] [phone] 03/04/2017  5:22 PM

## 2017-03-04 NOTE — Patient Instructions (Addendum)
Cough, Adult Coughing is a reflex that clears your throat and your airways. Coughing helps to heal and protect your lungs. It is normal to cough occasionally, but a cough that happens with other symptoms or lasts a long time may be a sign of a condition that needs treatment. A cough may last only 2-3 weeks (acute), or it may last longer than 8 weeks (chronic). What are the causes? Coughing is commonly caused by:  Breathing in substances that irritate your lungs.  A viral or bacterial respiratory infection.  Allergies.  Asthma.  Postnasal drip.  Smoking.  Acid backing up from the stomach into the esophagus (gastroesophageal reflux).  Certain medicines.  Chronic lung problems, including COPD (or rarely, lung cancer).  Other medical conditions such as heart failure. Follow these instructions at home: Pay attention to any changes in your symptoms. Take these actions to help with your discomfort:  Take medicines only as told by your health care provider.  If you were prescribed an antibiotic medicine, take it as told by your health care provider. Do not stop taking the antibiotic even if you start to feel better.  Talk with your health care provider before you take a cough suppressant medicine.  Drink enough fluid to keep your urine clear or pale yellow.  If the air is dry, use a cold steam vaporizer or humidifier in your bedroom or your home to help loosen secretions.  Avoid anything that causes you to cough at work or at home.  If your cough is worse at night, try sleeping in a semi-upright position.  Avoid cigarette smoke. If you smoke, quit smoking. If you need help quitting, ask your health care provider.  Avoid caffeine.  Avoid alcohol.  Rest as needed. Contact a health care provider if:  You have new symptoms.  You cough up pus.  Your cough does not get better after 2-3 weeks, or your cough gets worse.  You cannot control your cough with suppressant medicines  and you are losing sleep.  You develop pain that is getting worse or pain that is not controlled with pain medicines.  You have a fever.  You have unexplained weight loss.  You have night sweats. Get help right away if:  You cough up blood.  You have difficulty breathing.  Your heartbeat is very fast. This information is not intended to replace advice given to you by your health care provider. Make sure you discuss any questions you have with your health care provider. Document Released: 10/10/2010 Document Revised: 09/19/2015 Document Reviewed: 06/20/2014 Elsevier Interactive Patient Education  2017 Elsevier Inc.     IF you received an x-ray today, you will receive an invoice from [facility]. Please contact [facility] at [phone] with questions or concerns regarding your invoice.   IF you received labwork today, you will receive an invoice from [facility]. Please contact [facility] at [phone] with questions or concerns regarding your invoice.   Our billing staff will not be able to assist you with questions regarding bills from these companies.  You will be contacted with the lab results as soon as they are available. The fastest way to get your results is to activate your My Chart account. Instructions are located on the last page of this paperwork. If you have not heard from us regarding the results in 2 weeks, please contact this office.      

## 2017-03-05 LAB — BASIC METABOLIC PANEL
BUN/Creatinine Ratio: 17 (ref 12–28)
BUN: 14 mg/dL (ref 8–27)
CO2: 24 mmol/L (ref 20–29)
Calcium: 9.1 mg/dL (ref 8.7–10.3)
Chloride: 101 mmol/L (ref 96–106)
Creatinine, Ser: 0.84 mg/dL (ref 0.57–1.00)
GFR calc Af Amer: 84 mL/min/{1.73_m2} (ref >59)
GFR calc non Af Amer: 73 mL/min/{1.73_m2} (ref >59)
Glucose: 125 mg/dL — ABNORMAL HIGH (ref 65–99)
Potassium: 3.9 mmol/L (ref 3.5–5.2)
Sodium: 138 mmol/L (ref 134–144)

## 2017-03-16 ENCOUNTER — Ambulatory Visit
Admission: RE | Admit: 2017-03-16 | Discharge: 2017-03-16 | Disposition: A | Discharge status: Home / Self Care | Payer: PPO | Source: Ambulatory Visit | Attending: Family Medicine | Admitting: Family Medicine

## 2017-03-16 DIAGNOSIS — G44201 Tension-type headache, unspecified, intractable: Secondary | ICD-10-CM

## 2017-03-16 DIAGNOSIS — H539 Unspecified visual disturbance: Secondary | ICD-10-CM

## 2017-03-16 DIAGNOSIS — R51 Headache: Secondary | ICD-10-CM

## 2017-03-16 DIAGNOSIS — R519 Headache, unspecified: Secondary | ICD-10-CM

## 2017-03-16 DIAGNOSIS — R42 Dizziness and giddiness: Secondary | ICD-10-CM

## 2017-03-16 MED ORDER — IOPAMIDOL (ISOVUE-M 300) INJECTION 61%
15.0000 mL | Freq: Once | INTRAMUSCULAR | Status: AC | PRN
  Administered 2017-03-16: 15 mL via INTRATHECAL

## 2017-03-19 ENCOUNTER — Telehealth: Payer: Self-pay | Admitting: Family Medicine

## 2017-03-19 MED ORDER — FLUCONAZOLE 150 MG PO TABS: 150.0000 mg | ORAL_TABLET | Freq: Once | ORAL | 0 refills | Status: AC

## 2017-03-19 MED ORDER — AMOXICILLIN-POT CLAVULANATE 875-125 MG PO TABS: 1.0000 | ORAL_TABLET | Freq: Two times a day (BID) | ORAL | 0 refills | Status: AC

## 2017-03-19 NOTE — Telephone Encounter (Signed)
Left voicemail for patient to pick up antibiotic Also advised dental follow up If not improvement of symptoms will need ENT referral

## 2017-03-22 ENCOUNTER — Telehealth: Payer: Self-pay | Admitting: Family Medicine

## 2017-03-22 NOTE — Telephone Encounter (Signed)
Copied from [place] [phone]. Topic: Quick Communication - See Telephone Encounter >> Mar 22, 2017 11:25 AM Hewitt Shorts wrote: CRM for notification. See Telephone encounter for: pt is questioning why she has to take the medication fluconazole 150mg  before she is going to take it   Best number [phone] ok to leave a message   03/22/17.

## 2017-03-22 NOTE — Telephone Encounter (Signed)
Return call given to pt and explained that Fluconazole was written for vaginal irritation and itching. Notified pt that with taking antibiotics these symptoms could occur and this medication is used as prevention. Pt denies having any vaginal irritation or itching at this time and states she will take the medication if the symptoms develop. Pt also having complaints of diarrhea, and pt notified that it is often a side effect of taking antibiotics. Pt verbalized understanding and no additional concerns voiced at this time.

## 2017-04-16 ENCOUNTER — Ambulatory Visit: Payer: Self-pay

## 2017-04-16 NOTE — Telephone Encounter (Signed)
Pt. called to report she is having intermittent bilateral shoulder pain with right > left.  Also, reported she has intermittent pain in the right arm, "in the muscle."  Described the (R) arm pain as feeling like a "pulsating" pain at times.   Stated the pain has been occurring for about 3 weeks. Unable to tell if there is swelling, warmth, or redness.  Denies stiffness of shoulder joints.  Unaware of any physical strain on shoulder joints.  Reported the pain is worse when she lays down.  Denies fever / chills.  Questioned about chest pain.  Stated she has chest pain on occasion; reported last chest pain about 2 weeks ago, and stated it lasted 3 minutes.  Denies chest pain at this time.  Reported no shortness of breath with chest pain.  Re: shoulder pain, reported has been using Bengay, and Ibuprofen. With temporary relief.  Per protocol, scheduled appt. On 04/19/17 with PCP.  Advised to go to the ER if has worsening of symptoms, recurrent chest pain, or shortness of breath.  Verb. Understanding; agrees with plan.  Care advice given per protocol.          Reason for Disposition . [1] MODERATE pain (e.g., interferes with normal activities) AND [2] present > 3 days  Answer Assessment - Initial Assessment Questions 1. ONSET: "When did the pain start?"     "I always have pain in my shoulders; the right side is worse" 2. LOCATION: "Where is the pain located?"     Bilateral shoulder joints and in right arm 3. PAIN: "How bad is the pain?" (Scale 1-10; or mild, moderate, severe)   - MILD (1-3): doesn't interfere with normal activities   - MODERATE (4-7): interferes with normal activities (e.g., work or school) or awakens from sleep   - SEVERE (8-10): excruciating pain, unable to do any normal activities, unable to move arm at all due to pain     Severe/ can be pulsating in the right arm 4. WORK OR EXERCISE: "Has there been any recent work or exercise that involved this part of the body?"     No activity  associated in starting the pain  5. CAUSE: "What do you think is causing the shoulder pain?"     Unknown 6. OTHER SYMPTOMS: "Do you have any other symptoms?" (e.g., neck pain, swelling, rash, fever, numbness, weakness)     "I don't think there is swelling or redness or warmth; have been using Bengay oint."  Also, reports taking Ibuprofen 600 mg for pain.  C/o having chest pain between breasts and to the left side, at times; last episode was about 2 weeks ago that lasted about 3 min. Denies shortness of breath with chest pain or shoulder pain.   Denies chest pain today.  7. PREGNANCY: "Is there any chance you are pregnant?" "When was your last menstrual period?"     no  Protocols used: SHOULDER PAIN-A-AH

## 2017-04-19 ENCOUNTER — Other Ambulatory Visit: Payer: Self-pay

## 2017-04-19 ENCOUNTER — Encounter: Payer: Self-pay | Admitting: Family Medicine

## 2017-04-19 ENCOUNTER — Ambulatory Visit (INDEPENDENT_AMBULATORY_CARE_PROVIDER_SITE_OTHER): Payer: PPO

## 2017-04-19 ENCOUNTER — Ambulatory Visit (INDEPENDENT_AMBULATORY_CARE_PROVIDER_SITE_OTHER): Payer: PPO | Admitting: Family Medicine

## 2017-04-19 VITALS — BP 130/70 | HR 98 | Temp 98.0°F | Resp 16 | Ht 66.0 in | Wt 225.0 lb

## 2017-04-19 DIAGNOSIS — M25511 Pain in right shoulder: Secondary | ICD-10-CM | POA: Diagnosis not present

## 2017-04-19 DIAGNOSIS — M47812 Spondylosis without myelopathy or radiculopathy, cervical region: Secondary | ICD-10-CM | POA: Diagnosis not present

## 2017-04-19 DIAGNOSIS — M79601 Pain in right arm: Secondary | ICD-10-CM

## 2017-04-19 DIAGNOSIS — R059 Cough, unspecified: Secondary | ICD-10-CM

## 2017-04-19 DIAGNOSIS — R05 Cough: Secondary | ICD-10-CM

## 2017-04-19 DIAGNOSIS — I10 Essential (primary) hypertension: Secondary | ICD-10-CM

## 2017-04-19 MED ORDER — OMEPRAZOLE 20 MG PO CPDR: 20.0000 mg | DELAYED_RELEASE_CAPSULE | Freq: Every day | ORAL | 3 refills | Status: DC

## 2017-04-19 NOTE — Patient Instructions (Addendum)
     IF you received an x-ray today, you will receive an invoice from [facility]. Please contact [facility] at [phone] with questions or concerns regarding your invoice.   IF you received labwork today, you will receive an invoice from [facility]. Please contact [facility] at [phone] with questions or concerns regarding your invoice.   Our billing staff will not be able to assist you with questions regarding bills from these companies.  You will be contacted with the lab results as soon as they are available. The fastest way to get your results is to activate your My Chart account. Instructions are located on the last page of this paperwork. If you have not heard from us regarding the results in 2 weeks, please contact this office.     Cough, Adult Coughing is a reflex that clears your throat and your airways. Coughing helps to heal and protect your lungs. It is normal to cough occasionally, but a cough that happens with other symptoms or lasts a long time may be a sign of a condition that needs treatment. A cough may last only 2-3 weeks (acute), or it may last longer than 8 weeks (chronic). What are the causes? Coughing is commonly caused by:  Breathing in substances that irritate your lungs.  A viral or bacterial respiratory infection.  Allergies.  Asthma.  Postnasal drip.  Smoking.  Acid backing up from the stomach into the esophagus (gastroesophageal reflux).  Certain medicines.  Chronic lung problems, including COPD (or rarely, lung cancer).  Other medical conditions such as heart failure.  Follow these instructions at home: Pay attention to any changes in your symptoms. Take these actions to help with your discomfort:  Take medicines only as told by your health care provider. ? If you were prescribed an antibiotic medicine, take it as told by your health care provider. Do not stop taking the antibiotic even if you start to feel better. ? Talk  with your health care provider before you take a cough suppressant medicine.  Drink enough fluid to keep your urine clear or pale yellow.  If the air is dry, use a cold steam vaporizer or humidifier in your bedroom or your home to help loosen secretions.  Avoid anything that causes you to cough at work or at home.  If your cough is worse at night, try sleeping in a semi-upright position.  Avoid cigarette smoke. If you smoke, quit smoking. If you need help quitting, ask your health care provider.  Avoid caffeine.  Avoid alcohol.  Rest as needed.  Contact a health care provider if:  You have new symptoms.  You cough up pus.  Your cough does not get better after 2-3 weeks, or your cough gets worse.  You cannot control your cough with suppressant medicines and you are losing sleep.  You develop pain that is getting worse or pain that is not controlled with pain medicines.  You have a fever.  You have unexplained weight loss.  You have night sweats. Get help right away if:  You cough up blood.  You have difficulty breathing.  Your heartbeat is very fast. This information is not intended to replace advice given to you by your health care provider. Make sure you discuss any questions you have with your health care provider. Document Released: 10/10/2010 Document Revised: 09/19/2015 Document Reviewed: 06/20/2014 Elsevier Interactive Patient Education  2018 Elsevier Inc.  

## 2017-04-19 NOTE — Progress Notes (Signed)
Chief Complaint  Patient presents with  . Shoulder Pain    x 2 weeks that radiates down the right arm.   . Cough    pt states the cough have not been better since her last OV     HPI  She is a right handed female She has been having shoulder pain for the past 2 weeks She reports that she has been taking ibuprofen for her shoulder pain She states that it is only on the right side She gets the pain in the shoulder joint but it also can be in the elbow or wrist It is not currently a continuous shooting pain but seems to be in the upper extremity on the right at one of the 3 locations such as the shoulder, elbow or wrist.  Denies weakness of the grip She denies neck pain  2 months of cough She states that she has been coughing for 2 months She states that her cough is worse at night She coughs so hard she leaks urine She gets clear mucus She stats that she has been using mucinex it helped her cough and congestion   4 review of systems  Past Medical History:  Diagnosis Date  . Aortic stenosis, mild 07/2012   Mild AS with Mod MR -(Mean/Peak Gradient 20 mmHg/34 mmHg).  . Asthma   . Bilateral bunions    Bunionectomies performed  . Bronchitis, chronic ([place])   . Heart disease   . Hyperlipidemia   . Hypertension    Good control  . Inguinodynia    Bilateral groin pain  . Lower extremity edema    Chronic. Venous Duplex 11/06/11 SUMMARY: 1) Bilateral Lower Extremities: No evidence of DVT or thrombophlebitis.  2) Right Common Femoral Vein: Demonstrated mild valvular insufficiency with a greater than (1) sec of duration. Mildly abnormal LE Venous duplex Doppler.  . Mitral regurgitation due to cusp prolapse 07/2012   Mild AMVL prolapse, Mod MR  . Obesity   . Palpitations    Relatively well controlled  . Scoliosis    DG Chest 2 View x-ray on 07/30/11 by Dr. Everlene Farrier shows a scoliosis.    Current Outpatient Medications  Medication Sig Dispense Refill  . albuterol (PROVENTIL  HFA;VENTOLIN HFA) 108 (90 Base) MCG/ACT inhaler Inhale 2 puffs every 6 (six) hours as needed into the lungs. 1 Inhaler 1  . aspirin 81 MG tablet Take by mouth.    . calcium-vitamin D (OSCAL WITH D) 250-125 MG-UNIT tablet Take by mouth.    . cetirizine (ZYRTEC) 10 MG tablet Take 1 tablet (10 mg total) daily by mouth. 30 tablet 11  . Cholecalciferol (VITAMIN D-3) 1000 UNITS CAPS Take 1 capsule by mouth daily.    [name] Kitchen estradiol (ESTRACE) 2 MG tablet Take 2 mg by mouth daily.    . hydrochlorothiazide (HYDRODIURIL) 25 MG tablet Take by mouth.    [name] Kitchen ibuprofen (ADVIL,MOTRIN) 600 MG tablet Take 1 tablet (600 mg total) by mouth every 8 (eight) hours as needed. 30 tablet 0  . lisinopril (PRINIVIL,ZESTRIL) 20 MG tablet Take by mouth.    . traMADol (ULTRAM) 50 MG tablet Take 1 tablet (50 mg total) by mouth every 6 (six) hours as needed. Reported on 08/20/2015 30 tablet 0  . omeprazole (PRILOSEC) 20 MG capsule Take 1 capsule (20 mg total) by mouth daily. 30 capsule 3   No current facility-administered medications for this visit.     Allergies: No Known Allergies  Past Surgical History:  Procedure Laterality Date  .  ABDOMINAL HYSTERECTOMY    . ANKLE ARTHROSCOPY Left   . BUNIONECTOMY Bilateral   . KNEE ARTHROSCOPY Right   . Left knee open surgery    . TOTAL ABDOMINAL HYSTERECTOMY    . TRANSTHORACIC ECHOCARDIOGRAM  07/2012; 01/2013   a) 4/'14: mild-mod AS, Mod AI (mean/peak gradient: 18 mmHg / 31 mmhg).  Mild MVP w/ Mod MR, no MS.  Mildly elevated PAP (~37 mmHg) w/ Mod TR. EF 55-60%. GR 1 DD.;; b). 01/2013: EF 60-65%. No RWMA. Gr  DD. Mild AS (M / P Gradient 20 mmHg/34 mmHg). Mild AMVL Prolapse w/ Mod MR. Mod LA dilation. Mod TR.  [name] Kitchen TRANSTHORACIC ECHOCARDIOGRAM  01/2015; 01/2016   a) Normal LV size and function. GR 1 DD. Moderate LA Dilation. Calcified aortic valve with mild AS, moderate (2+) AI. Restricted posterior MV leaflet movement w/ Mod MR. Mode TR with mod elevated PA pressures (45 mmHg);; b)  Relatively stable. Moderate concentric LVH. Moderate AI with mild AS (Mean-PeaK Gradient: 19 / 36 mmHg). Mild MR. Mod LA dilation. PAP ~40 mmHg -> plan follow-up 2020.    Social History   Socioeconomic History  . Marital status: Married    Spouse name: Herbie Baltimore  . Number of children: 2  . Years of education: None  . Highest education level: None  Social Needs  . Financial resource strain: None  . Food insecurity - worry: None  . Food insecurity - inability: None  . Transportation needs - medical: None  . Transportation needs - non-medical: None  Occupational History  . Occupation: production    Employer: [employer]    Comment: check printing/shipping  Tobacco Use  . Smoking status: Never Smoker  . Smokeless tobacco: Never Used  Substance and Sexual Activity  . Alcohol use: No  . Drug use: No  . Sexual activity: No  Other Topics Concern  . None  Social History Narrative   Lives with her husband.  Their children live nearby.    Family History  Problem Relation Age of Onset  . Arthritis Mother   . Hyperlipidemia Mother   . Diabetes Mother   . Gout Mother   . Hypertension Mother   . Cancer Father        GI cancer  . Diabetes Maternal Grandfather   . Hypertension Brother   . Diabetes Brother   . Hypertension Brother      ROS Review of Systems See HPI Constitution: No fevers or chills No malaise No diaphoresis Skin: No rash or itching Eyes: no blurry vision, no double vision GU: no dysuria or hematuria Neuro: no dizziness or headaches all others reviewed and negative   Objective: Vitals:   04/19/17 1100  BP: 130/70  Pulse: 98  Resp: 16  Temp: 98 F (36.7 C)  TempSrc: Oral  SpO2: 98%  Weight: 225 lb (102.1 kg)  Height: 5\' 6"  (1.676 m)    Physical Exam General: alert, oriented, in NAD Head: normocephalic, atraumatic, no sinus tenderness Eyes: EOM intact, no scleral icterus or conjunctival injection Ears: TM clear bilaterally Nose: mucosa  nonerythematous, nonedematous Throat: no pharyngeal exudate or erythema Lymph: no posterior auricular, submental or cervical lymph adenopathy Heart: normal rate, normal sinus rhythm, + systolic murmur  Lungs: clear to auscultation bilaterally, no wheezing  Musculoskeletal exam Reduced range of motion on the right with weakness with abduction, external rotation Pt is able to abduct to 100 degrees without pain Grip strength is normal bilaterally  IMPRESSION: No active cardiopulmonary disease.  Electronically Signed  By: Lorriane Shire M.D.   On: 04/19/2017 12:09  IMPRESSION: Minimal arthritic changes at the [facility] joint and glenohumeral joint.  Electronically Signed   By: Lorriane Shire M.D.   On: 04/19/2017 12:07    Assessment and Plan Lisa Miles was seen today for shoulder pain and cough.  Diagnoses and all orders for this visit:  Pain of right upper extremity -     DG Cervical Spine Complete; Future -     DG Shoulder Right; Future  Cough -     DG Chest 2 View; Future  Other orders -     omeprazole (PRILOSEC) 20 MG capsule; Take 1 capsule (20 mg total) by mouth daily.   Will treat this cough with PPI Will rule out other causes with CXR Lung exam clear today Continue mucinex but not with DM due to her heart murmur  Essential hypertension- avoid stimulants as this can raise bp   Omari Mcmanaway A Saragrace Selke

## 2017-04-22 ENCOUNTER — Ambulatory Visit: Payer: No Typology Code available for payment source | Admitting: Family Medicine

## 2017-05-31 ENCOUNTER — Other Ambulatory Visit: Payer: Self-pay | Admitting: Cardiology

## 2017-05-31 NOTE — Telephone Encounter (Signed)
Rx request sent to pharmacy.  

## 2017-06-02 ENCOUNTER — Telehealth: Payer: Self-pay | Admitting: Family Medicine

## 2017-06-02 DIAGNOSIS — R05 Cough: Secondary | ICD-10-CM

## 2017-06-02 DIAGNOSIS — R51 Headache: Secondary | ICD-10-CM

## 2017-06-02 DIAGNOSIS — R519 Headache, unspecified: Secondary | ICD-10-CM

## 2017-06-02 DIAGNOSIS — R059 Cough, unspecified: Secondary | ICD-10-CM

## 2017-06-02 NOTE — Telephone Encounter (Signed)
Please advise 

## 2017-06-02 NOTE — Telephone Encounter (Signed)
Copied from [place]. Topic: Referral - Request >> Jun 02, 2017 11:40 AM Margot Ables wrote: Reason for CRM: pt called stating she has been in office for respiratory illness several times and just doesn't feel better - she is requesting a referral to a specialist - please advise

## 2017-06-08 ENCOUNTER — Emergency Department (HOSPITAL_COMMUNITY): Payer: PPO

## 2017-06-08 ENCOUNTER — Emergency Department (HOSPITAL_COMMUNITY)
Admission: EM | Admit: 2017-06-08 | Discharge: 2017-06-08 | Disposition: A | Discharge status: Home / Self Care | Payer: PPO | Attending: Emergency Medicine | Admitting: Emergency Medicine

## 2017-06-08 ENCOUNTER — Encounter (HOSPITAL_COMMUNITY): Payer: Self-pay | Admitting: Emergency Medicine

## 2017-06-08 ENCOUNTER — Other Ambulatory Visit: Payer: Self-pay

## 2017-06-08 DIAGNOSIS — Z79899 Other long term (current) drug therapy: Secondary | ICD-10-CM | POA: Insufficient documentation

## 2017-06-08 DIAGNOSIS — I119 Hypertensive heart disease without heart failure: Secondary | ICD-10-CM | POA: Insufficient documentation

## 2017-06-08 DIAGNOSIS — J45901 Unspecified asthma with (acute) exacerbation: Secondary | ICD-10-CM | POA: Insufficient documentation

## 2017-06-08 DIAGNOSIS — Z7982 Long term (current) use of aspirin: Secondary | ICD-10-CM | POA: Diagnosis not present

## 2017-06-08 DIAGNOSIS — R05 Cough: Secondary | ICD-10-CM | POA: Diagnosis not present

## 2017-06-08 DIAGNOSIS — J40 Bronchitis, not specified as acute or chronic: Secondary | ICD-10-CM

## 2017-06-08 MED ORDER — BENZONATATE 100 MG PO CAPS: 100.0000 mg | ORAL_CAPSULE | Freq: Three times a day (TID) | ORAL | 0 refills | Status: DC

## 2017-06-08 MED ORDER — IPRATROPIUM-ALBUTEROL 0.5-2.5 (3) MG/3ML IN SOLN
3.0000 mL | Freq: Once | RESPIRATORY_TRACT | Status: AC
  Administered 2017-06-08: 3 mL via RESPIRATORY_TRACT
  Filled 2017-06-08: qty 3

## 2017-06-08 NOTE — ED Provider Notes (Signed)
[facility] EMERGENCY DEPARTMENT Provider Note   CSN: [id number] Arrival date & time: 06/08/17  1345     History   Chief Complaint Chief Complaint  Patient presents with  . Cough  . Sore Throat    [name] is a 67 y.o. female.  HPI   67 year old female presents today with complaints of cough.  She has had a 33-month history of symptoms.  She notes to need mucus production.  Patient has been seen numerous times by several providers.  She has had workup for indigestion, chest pain workup, and primary care workup.  She has had numerous chest x-rays with no significant findings, has been diagnosed with asthma/bronchitis.  Patient has taken antibiotics, prednisone, breathing treatments, Coricidin that improvement in her symptoms.  Patient notes this morning she did have some blood in her mucus.  She notes occasionally she can hear a wheeze in her chest.  She denies any smoking history, any abnormal exposures.  Patient does note that family members in the house are also experiencing cough.  Patient notes she does have a follow-up with a ENT specialist next month.  Denies any significant baseline changes of her cough.  Past Medical History:  Diagnosis Date  . Aortic stenosis, mild 07/2012   Mild AS with Mod MR -(Mean/Peak Gradient 20 mmHg/34 mmHg).  . Asthma   . Bilateral bunions    Bunionectomies performed  . Bronchitis, chronic ([place])   . Heart disease   . Hyperlipidemia   . Hypertension    Good control  . Inguinodynia    Bilateral groin pain  . Lower extremity edema    Chronic. Venous Duplex 11/06/11 SUMMARY: 1) Bilateral Lower Extremities: No evidence of DVT or thrombophlebitis.  2) Right Common Femoral Vein: Demonstrated mild valvular insufficiency with a greater than (1) sec of duration. Mildly abnormal LE Venous duplex Doppler.  . Mitral regurgitation due to cusp prolapse 07/2012   Mild AMVL prolapse, Mod MR  . Obesity   . Palpitations    Relatively well controlled  . Scoliosis    DG Chest 2 View x-ray on 07/30/11 by Dr. Everlene Farrier shows a scoliosis.    Patient Active Problem List   Diagnosis Date Noted  . Visual changes 10/14/2016  . Menopause 08/20/2015  . Chest pain at rest 08/31/2014  . Fungal dermatitis 05/23/2014  . Heart palpitations 10/19/2013  . Thyromegaly 03/28/2013  . Routine general medical examination at a health care facility 03/28/2013  . Obesity (BMI 30-39.9) 03/28/2013  . Lower extremity edema   . Inguinodynia 12/23/2012  . Essential hypertension 07/14/2012  . Bilateral groin pain 07/14/2012  . Dyslipidemia 05/30/2009  . Mitral regurgitation due to cusp prolapse - moderate MR with mild prolapse 05/30/2009  . Aortic valve disorder 05/30/2009  . ASTHMA UNSPECIFIED WITH EXACERBATION 04/02/2009    Past Surgical History:  Procedure Laterality Date  . ABDOMINAL HYSTERECTOMY    . ANKLE ARTHROSCOPY Left   . BUNIONECTOMY Bilateral   . KNEE ARTHROSCOPY Right   . Left knee open surgery    . TOTAL ABDOMINAL HYSTERECTOMY    . TRANSTHORACIC ECHOCARDIOGRAM  07/2012; 01/2013   a) 4/'14: mild-mod AS, Mod AI (mean/peak gradient: 18 mmHg / 31 mmhg).  Mild MVP w/ Mod MR, no MS.  Mildly elevated PAP (~37 mmHg) w/ Mod TR. EF 55-60%. GR 1 DD.;; b). 01/2013: EF 60-65%. No RWMA. Gr  DD. Mild AS (M / P Gradient 20 mmHg/34 mmHg). Mild AMVL Prolapse w/ Mod MR.  Mod LA dilation. Mod TR.  [name] Kitchen TRANSTHORACIC ECHOCARDIOGRAM  01/2015; 01/2016   a) Normal LV size and function. GR 1 DD. Moderate LA Dilation. Calcified aortic valve with mild AS, moderate (2+) AI. Restricted posterior MV leaflet movement w/ Mod MR. Mode TR with mod elevated PA pressures (45 mmHg);; b) Relatively stable. Moderate concentric LVH. Moderate AI with mild AS (Mean-PeaK Gradient: 19 / 36 mmHg). Mild MR. Mod LA dilation. PAP ~40 mmHg -> plan follow-up 2020.    OB History    No data available       Home Medications    Prior to Admission medications     Medication Sig Start Date End Date Taking? Authorizing Provider  albuterol (PROVENTIL HFA;VENTOLIN HFA) 108 (90 Base) MCG/ACT inhaler Inhale 2 puffs every 6 (six) hours as needed into the lungs. 03/04/17   Jaynee Eagles, PA-C  aspirin 81 MG tablet Take by mouth.    [provider]  benzonatate (TESSALON) 100 MG capsule Take 1 capsule (100 mg total) by mouth every 8 (eight) hours. 06/08/17   Bern Fare, Dellis Filbert, PA-C  calcium-vitamin D (OSCAL WITH D) 250-125 MG-UNIT tablet Take by mouth.    [provider]  cetirizine (ZYRTEC) 10 MG tablet Take 1 tablet (10 mg total) daily by mouth. 03/04/17   Jaynee Eagles, PA-C  Cholecalciferol (VITAMIN D-3) 1000 UNITS CAPS Take 1 capsule by mouth daily.    [provider]  estradiol (ESTRACE) 2 MG tablet Take 2 mg by mouth daily.    [provider]  hydrochlorothiazide (HYDRODIURIL) 25 MG tablet Take by mouth.    [provider]  hydrochlorothiazide (HYDRODIURIL) 25 MG tablet TAKE 1 TABLET(25 MG) BY MOUTH DAILY 05/31/17   Leonie Man, MD  ibuprofen (ADVIL,MOTRIN) 600 MG tablet Take 1 tablet (600 mg total) by mouth every 8 (eight) hours as needed. 02/18/17   Forrest Moron, MD  lisinopril (PRINIVIL,ZESTRIL) 20 MG tablet Take by mouth.    [provider]  lisinopril (PRINIVIL,ZESTRIL) 20 MG tablet TAKE 1 TABLET(20 MG) BY MOUTH DAILY 05/31/17   Leonie Man, MD  omeprazole (PRILOSEC) 20 MG capsule Take 1 capsule (20 mg total) by mouth daily. 04/19/17   Forrest Moron, MD  traMADol (ULTRAM) 50 MG tablet Take 1 tablet (50 mg total) by mouth every 6 (six) hours as needed. Reported on 08/20/2015 01/10/16   McVey, Gelene Mink, PA-C    Family History Family History  Problem Relation Age of Onset  . Arthritis Mother   . Hyperlipidemia Mother   . Diabetes Mother   . Gout Mother   . Hypertension Mother   . Cancer Father        GI cancer  . Diabetes Maternal Grandfather   . Hypertension Brother   . Diabetes  Brother   . Hypertension Brother     Social History Social History   Tobacco Use  . Smoking status: Never Smoker  . Smokeless tobacco: Never Used  Substance Use Topics  . Alcohol use: No  . Drug use: No     Allergies   Patient has no known allergies.   Review of Systems Review of Systems  All other systems reviewed and are negative.    Physical Exam Updated Vital Signs BP 124/70 (BP Location: Right Arm)   Pulse 74   Temp 98 F (36.7 C) (Oral)   Resp 16   SpO2 100%   Physical Exam  Constitutional: She is oriented to [place], place, and time. She appears well-developed  and well-nourished.  HENT:  Head: Normocephalic and atraumatic.  Mouth/Throat: Uvula is midline, oropharynx is clear and moist and mucous membranes are normal. No oropharyngeal exudate, posterior oropharyngeal edema, posterior oropharyngeal erythema or tonsillar abscesses.  Eyes: Conjunctivae are normal. Pupils are equal, round, and reactive to light. Right eye exhibits no discharge. Left eye exhibits no discharge. No scleral icterus.  Neck: Normal range of motion. No JVD present. No tracheal deviation present.  Cardiovascular: Normal rate, regular rhythm, normal heart sounds and intact distal pulses.  Pulmonary/Chest: Effort normal and breath sounds normal. No stridor. No respiratory distress. She has no wheezes. She has no rales. She exhibits no tenderness.  Neurological: She is alert and oriented to [place], place, and time. Coordination normal.  Skin: Skin is warm.  Psychiatric: She has a normal mood and affect. Her behavior is normal. Judgment and thought content normal.  Nursing note and vitals reviewed.    ED Treatments / Results  Labs (all labs ordered are listed, but only abnormal results are displayed) Labs Reviewed - No data to display  EKG  EKG Interpretation None       Radiology Dg Chest 2 View  Result Date: 06/08/2017 CLINICAL DATA:  Three months of cough. Episode of  hemoptysis today. The patient reports generalized body aches and headache currently. History of aortic stenosis and mitral regurgitation, asthma-chronic bronchitis, nonsmoker. EXAM: CHEST  2 VIEW COMPARISON:  Chest x-ray of April 19, 2017 FINDINGS: The lungs are adequately inflated. The interstitial markings are mildly prominent though stable. There is no pleural effusion. The heart is top-normal in size. The pulmonary vascularity is normal. There is mild biapical pleural thickening. There is mild multilevel degenerative disc disease of the thoracic spine. IMPRESSION: Mild chronic bronchitic-reactive airway changes. No acute pneumonia nor CHF. Top-normal cardiac size. Electronically Signed   By: David  Martinique M.D.   On: 06/08/2017 14:29    Procedures Procedures (including critical care time)  Medications Ordered in ED Medications  ipratropium-albuterol (DUONEB) 0.5-2.5 (3) MG/3ML nebulizer solution 3 mL (3 mLs Nebulization Given 06/08/17 1608)     Initial Impression / Assessment and Plan / ED Course  I have reviewed the triage vital signs and the nursing notes.  Pertinent labs & imaging results that were available during my care of the patient were reviewed by me and considered in my medical decision making (see chart for details).     Final Clinical Impressions(s) / ED Diagnoses   Final diagnoses:  Bronchitis    Labs:   Imaging: DG chest 2 view  Consults:  Therapeutics:  Discharge Meds: Tessalon  Assessment/Plan: 67 year old female presents today with cough.  She is very well-appearing in no acute distress.  She has here.  Patient with cough and mucus production, chest x-ray showing likely bronchitis.  Patient has been treated with steroids, antibiotics, cough medication in the past with no symptomatic improvement.  She was offered a breathing treatment here which did not significantly improve her symptoms.  Patient is supposed to follow-up with an ENT, I recommend that she  follows up as an outpatient with pulmonology giving her 65-month history.  She has no significant fever weight loss or any signs of malignancy.  Patient does not have any chest pain or significant shortness of breath, no signs of DVT or PE.  Patient is encouraged to make follow-up appointments as instructed, return to the emergency room with any new or worsening signs or symptoms.  Patient verbalized understanding and agreement to today's plan.  ED Discharge Orders        Ordered    benzonatate (TESSALON) 100 MG capsule  Every 8 hours     06/08/17 1640       Okey Regal, PA-C 06/08/17 1724    Blanchie Dessert, MD 06/08/17 2205

## 2017-06-08 NOTE — Discharge Instructions (Signed)
Please read attached information. If you experience any new or worsening signs or symptoms please return to the emergency room for evaluation. Please follow-up with your primary care provider or specialist as discussed.  °

## 2017-06-08 NOTE — ED Notes (Signed)
Declined W/C at D/C and was escorted to lobby by RN. 

## 2017-06-08 NOTE — ED Triage Notes (Signed)
Pt states for over 3 months she has had a productive cough with sore throat. Pt states she has been coughing up green mucus for 3 months, and this morning she coughed up bright red blood and this is the first time this has happened. Pt has appointment,ent for ENT in March but states unable to wait unable then.

## 2017-06-09 ENCOUNTER — Ambulatory Visit (INDEPENDENT_AMBULATORY_CARE_PROVIDER_SITE_OTHER): Payer: PPO | Admitting: Pulmonary Disease

## 2017-06-09 ENCOUNTER — Other Ambulatory Visit (INDEPENDENT_AMBULATORY_CARE_PROVIDER_SITE_OTHER): Payer: PPO

## 2017-06-09 ENCOUNTER — Encounter: Payer: Self-pay | Admitting: Pulmonary Disease

## 2017-06-09 VITALS — BP 126/76 | HR 87 | Ht 68.0 in | Wt 227.0 lb

## 2017-06-09 DIAGNOSIS — R059 Cough, unspecified: Secondary | ICD-10-CM

## 2017-06-09 DIAGNOSIS — R05 Cough: Secondary | ICD-10-CM

## 2017-06-09 LAB — CBC WITH DIFFERENTIAL/PLATELET
Basophils Absolute: 0 10*3/uL (ref 0.0–0.1)
Basophils Relative: 0.9 % (ref 0.0–3.0)
Eosinophils Absolute: 0.1 10*3/uL (ref 0.0–0.7)
Eosinophils Relative: 3.3 % (ref 0.0–5.0)
HCT: 38.5 % (ref 36.0–46.0)
Hemoglobin: 13.1 g/dL (ref 12.0–15.0)
Lymphocytes Relative: 39 % (ref 12.0–46.0)
Lymphs Abs: 1.7 10*3/uL (ref 0.7–4.0)
MCHC: 34.1 g/dL (ref 30.0–36.0)
MCV: 87.9 fl (ref 78.0–100.0)
Monocytes Absolute: 0.4 10*3/uL (ref 0.1–1.0)
Monocytes Relative: 9.5 % (ref 3.0–12.0)
Neutro Abs: 2.1 10*3/uL (ref 1.4–7.7)
Neutrophils Relative %: 47.3 % (ref 43.0–77.0)
Platelets: 289 10*3/uL (ref 150.0–400.0)
RBC: 4.37 Mil/uL (ref 3.87–5.11)
RDW: 13.4 % (ref 11.5–15.5)
WBC: 4.4 10*3/uL (ref 4.0–10.5)

## 2017-06-09 LAB — NITRIC OXIDE: Nitric Oxide: 8

## 2017-06-09 MED ORDER — OMEPRAZOLE 20 MG PO CPDR: 20.0000 mg | DELAYED_RELEASE_CAPSULE | Freq: Two times a day (BID) | ORAL | 2 refills | Status: DC

## 2017-06-09 MED ORDER — AZELASTINE-FLUTICASONE 137-50 MCG/ACT NA SUSP: 2.0000 | Freq: Every day | NASAL | 3 refills | Status: DC

## 2017-06-09 NOTE — Progress Notes (Signed)
Lisa Miles    [id number]    09-07-1950  Primary Care Physician:Stallings, Arlie Solomons, MD  Referring Physician: Forrest Moron, MD [place], [place] [address]  Chief complaint: Consult for bronchitis, asthma  HPI: 66 year old with history of bronchitis, asthma.   C/O recurrent episodes of cough, wheezing, mucus production for the past 3 months.  She has been evaluated by primary care and given omeprazole, prednisone, Mucinex with no improvement in symptoms.  Seen in the emergency room on 04/07/18 with cough, mucus production.  She was treated with nebulizers with improvement in symptoms.  Scheduled to follow-up with ENT.  She had been given Prilosec in the past by primary care but has not helped much.  Has symptoms of nonproductive cough, itchy throat, postnasal drip.  Denies any allergy symptoms or acid reflux.  Pets: No pets Occupation: Used to work in a Manufacturing engineer and sewing no known exposures, no mold, hot tub Exposures: No known exposures, no mold, hot tub Smoking history: Never smoker Travel History: Born in [place].  Immigrated to [place] in 1970s.  Lived in [place] since 1991.  Outpatient Encounter Medications as of 06/09/2017  Medication Sig  . albuterol (PROVENTIL HFA;VENTOLIN HFA) 108 (90 Base) MCG/ACT inhaler Inhale 2 puffs every 6 (six) hours as needed into the lungs.  [name] Kitchen aspirin 81 MG tablet Take by mouth.  . benzonatate (TESSALON PERLES) 100 MG capsule Take 100 mg by mouth 3 (three) times daily as needed for cough.  . calcium-vitamin D (OSCAL WITH D) 250-125 MG-UNIT tablet Take by mouth.  . Cholecalciferol (VITAMIN D-3) 1000 UNITS CAPS Take 1 capsule by mouth daily.  [name] Kitchen estradiol (ESTRACE) 2 MG tablet Take 2 mg by mouth daily.  . hydrochlorothiazide (HYDRODIURIL) 25 MG tablet TAKE 1 TABLET(25 MG) BY MOUTH DAILY  . ibuprofen (ADVIL,MOTRIN) 600 MG tablet Take 1 tablet (600 mg total) by mouth every 8 (eight) hours as needed.  [name] Kitchen  lisinopril (PRINIVIL,ZESTRIL) 20 MG tablet Take by mouth.  . traMADol (ULTRAM) 50 MG tablet Take 1 tablet (50 mg total) by mouth every 6 (six) hours as needed. Reported on 08/20/2015  . [DISCONTINUED] benzonatate (TESSALON) 100 MG capsule Take 1 capsule (100 mg total) by mouth every 8 (eight) hours.  . [DISCONTINUED] cetirizine (ZYRTEC) 10 MG tablet Take 1 tablet (10 mg total) daily by mouth.  . [DISCONTINUED] hydrochlorothiazide (HYDRODIURIL) 25 MG tablet Take by mouth.  . [DISCONTINUED] lisinopril (PRINIVIL,ZESTRIL) 20 MG tablet TAKE 1 TABLET(20 MG) BY MOUTH DAILY  . [DISCONTINUED] omeprazole (PRILOSEC) 20 MG capsule Take 1 capsule (20 mg total) by mouth daily.   No facility-administered encounter medications on file as of 06/09/2017.     Allergies as of 06/09/2017  . (No Known Allergies)    Past Medical History:  Diagnosis Date  . Aortic stenosis, mild 07/2012   Mild AS with Mod MR -(Mean/Peak Gradient 20 mmHg/34 mmHg).  . Asthma   . Bilateral bunions    Bunionectomies performed  . Bronchitis, chronic ([place])   . Heart disease   . Hyperlipidemia   . Hypertension    Good control  . Inguinodynia    Bilateral groin pain  . Lower extremity edema    Chronic. Venous Duplex 11/06/11 SUMMARY: 1) Bilateral Lower Extremities: No evidence of DVT or thrombophlebitis.  2) Right Common Femoral Vein: Demonstrated mild valvular insufficiency with a greater than (1) sec of duration. Mildly abnormal LE Venous duplex Doppler.  . Mitral  regurgitation due to cusp prolapse 07/2012   Mild AMVL prolapse, Mod MR  . Obesity   . Palpitations    Relatively well controlled  . Scoliosis    DG Chest 2 View x-ray on 07/30/11 by Dr. Everlene Farrier shows a scoliosis.    Past Surgical History:  Procedure Laterality Date  . ABDOMINAL HYSTERECTOMY    . ANKLE ARTHROSCOPY Left   . BUNIONECTOMY Bilateral   . KNEE ARTHROSCOPY Right   . Left knee open surgery    . TOTAL ABDOMINAL HYSTERECTOMY    . TRANSTHORACIC  ECHOCARDIOGRAM  07/2012; 01/2013   a) 4/'14: mild-mod AS, Mod AI (mean/peak gradient: 18 mmHg / 31 mmhg).  Mild MVP w/ Mod MR, no MS.  Mildly elevated PAP (~37 mmHg) w/ Mod TR. EF 55-60%. GR 1 DD.;; b). 01/2013: EF 60-65%. No RWMA. Gr  DD. Mild AS (M / P Gradient 20 mmHg/34 mmHg). Mild AMVL Prolapse w/ Mod MR. Mod LA dilation. Mod TR.  [name] Kitchen TRANSTHORACIC ECHOCARDIOGRAM  01/2015; 01/2016   a) Normal LV size and function. GR 1 DD. Moderate LA Dilation. Calcified aortic valve with mild AS, moderate (2+) AI. Restricted posterior MV leaflet movement w/ Mod MR. Mode TR with mod elevated PA pressures (45 mmHg);; b) Relatively stable. Moderate concentric LVH. Moderate AI with mild AS (Mean-PeaK Gradient: 19 / 36 mmHg). Mild MR. Mod LA dilation. PAP ~40 mmHg -> plan follow-up 2020.    Family History  Problem Relation Age of Onset  . Arthritis Mother   . Hyperlipidemia Mother   . Diabetes Mother   . Gout Mother   . Hypertension Mother   . Cancer Father        GI cancer  . Diabetes Maternal Grandfather   . Hypertension Brother   . Diabetes Brother   . Hypertension Brother     Social History   Socioeconomic History  . Marital status: Married    Spouse name: Herbie Baltimore  . Number of children: 2  . Years of education: Not on file  . Highest education level: Not on file  Social Needs  . Financial resource strain: Not on file  . Food insecurity - worry: Not on file  . Food insecurity - inability: Not on file  . Transportation needs - medical: Not on file  . Transportation needs - non-medical: Not on file  Occupational History  . Occupation: production    Employer: [employer]    Comment: check printing/shipping  Tobacco Use  . Smoking status: Never Smoker  . Smokeless tobacco: Never Used  Substance and Sexual Activity  . Alcohol use: No  . Drug use: No  . Sexual activity: No  Other Topics Concern  . Not on file  Social History Narrative   Lives with her husband.  Their children live  nearby.    Review of systems: Review of Systems  Constitutional: Negative for fever and chills.  HENT: Negative.   Eyes: Negative for blurred vision.  Respiratory: as per HPI  Cardiovascular: Negative for chest pain and palpitations.  Gastrointestinal: Negative for vomiting, diarrhea, blood per rectum. Genitourinary: Negative for dysuria, urgency, frequency and hematuria.  Musculoskeletal: Negative for myalgias, back pain and joint pain.  Skin: Negative for itching and rash.  Neurological: Negative for dizziness, tremors, focal weakness, seizures and loss of consciousness.  Endo/Heme/Allergies: Negative for environmental allergies.  Psychiatric/Behavioral: Negative for depression, suicidal ideas and hallucinations.  All other systems reviewed and are negative.  Physical Exam: Blood pressure 126/76, pulse 87, height 5'  8" (1.727 m), weight 227 lb (103 kg), SpO2 95 %. Gen:      No acute distress HEENT:  EOMI, sclera anicteric Neck:     No masses; no thyromegaly Lungs:    Clear to auscultation bilaterally; normal respiratory effort CV:         Regular rate and rhythm; no murmurs Abd:      + bowel sounds; soft, non-tender; no palpable masses, no distension Ext:    No edema; adequate peripheral perfusion Skin:      Warm and dry; no rash Neuro: alert and oriented x 3 Psych: normal mood and affect  Data Reviewed: CT abdomen pelvis 07/29/12-visualized lung bases are clear with no abnormalities Chest x-ray 06/08/17-mild interstitial markings, no acute lung abnormality. I have reviewed the images personally  FENO 06/09/1916  Echocardiogram 02/05/16-moderate concentric LVH, mild aortic stenosis, moderate aortic regurgitation, mild mitral regurgitation. PA peak pressure of 48 mm.  Assessment:  Evaluation for chronic cough Suspect upper airway cough from postnasal drip.  She is on ACE inhibitor but reports that she has been on this medication for the past 10 years without any problem.   Suspicion for asthma is low.  Treat postnasal drip with chlorpheniramine antihistamine and Dymista nasal spray Schedule pulmonary function test, CBC differential and blood allergy profile for evaluation of asthma. Restart Prilosec 20 mg twice daily for treatment of GERD.  If she continues to cough then will discuss with her cardiologist about switching ACE inhibitor to ARB  Plan/Recommendations: - Start chlorpheniramine 8 mg 3 times daily and Dymista nasal spray - PFTs, CBC differential, blood allergy profile - Restart Prilosec  Marshell Garfinkel MD [facility] Pager [phone] 06/09/2017, 11:33 AM  CC: Forrest Moron, MD

## 2017-06-09 NOTE — Patient Instructions (Signed)
We will start on chlorpheniramine 8 mg 3 times daily and Dymista nasal spray We will schedule you for pulmonary function test, CBC differential and blood allergy profile Restart Prilosec 20 mg twice daily Follow-up in 1-2 months.

## 2017-06-10 LAB — RESPIRATORY ALLERGY PROFILE REGION II ~~LOC~~

## 2017-06-10 LAB — INTERPRETATION:

## 2017-07-06 DIAGNOSIS — Z01419 Encounter for gynecological examination (general) (routine) without abnormal findings: Secondary | ICD-10-CM | POA: Diagnosis not present

## 2017-07-06 DIAGNOSIS — Z6837 Body mass index (BMI) 37.0-37.9, adult: Secondary | ICD-10-CM | POA: Diagnosis not present

## 2017-07-07 ENCOUNTER — Ambulatory Visit (INDEPENDENT_AMBULATORY_CARE_PROVIDER_SITE_OTHER): Admitting: Pulmonary Disease

## 2017-07-07 ENCOUNTER — Ambulatory Visit (INDEPENDENT_AMBULATORY_CARE_PROVIDER_SITE_OTHER): Payer: PPO | Admitting: Pulmonary Disease

## 2017-07-07 ENCOUNTER — Encounter: Payer: Self-pay | Admitting: Pulmonary Disease

## 2017-07-07 VITALS — BP 134/76 | HR 67 | Ht 64.5 in | Wt 227.0 lb

## 2017-07-07 DIAGNOSIS — R05 Cough: Secondary | ICD-10-CM

## 2017-07-07 DIAGNOSIS — R059 Cough, unspecified: Secondary | ICD-10-CM

## 2017-07-07 LAB — PULMONARY FUNCTION TEST
DL/VA % pred: 116 %
DL/VA: 5.64 ml/min/mmHg/L
DLCO cor % pred: 80 %
DLCO cor: 20.12 ml/min/mmHg
DLCO unc % pred: 79 %
DLCO unc: 19.93 ml/min/mmHg
FEF 25-75 Post: 2.55 L/sec
FEF 25-75 Pre: 2.86 L/sec
FEF2575-%Change-Post: -10 %
FEF2575-%Pred-Post: 136 %
FEF2575-%Pred-Pre: 153 %
FEV1-%Change-Post: -6 %
FEV1-%Pred-Post: 108 %
FEV1-%Pred-Pre: 115 %
FEV1-Post: 2.14 L
FEV1-Pre: 2.27 L
FEV1FVC-%Change-Post: 4 %
FEV1FVC-%Pred-Pre: 112 %
FEV6-%Change-Post: -10 %
FEV6-%Pred-Post: 95 %
FEV6-%Pred-Pre: 106 %
FEV6-Post: 2.33 L
FEV6-Pre: 2.6 L
FEV6FVC-%Pred-Post: 104 %
FEV6FVC-%Pred-Pre: 104 %
FVC-%Change-Post: -10 %
FVC-%Pred-Post: 92 %
FVC-%Pred-Pre: 102 %
FVC-Post: 2.33 L
FVC-Pre: 2.6 L
Post FEV1/FVC ratio: 92 %
Post FEV6/FVC ratio: 100 %
Pre FEV1/FVC ratio: 87 %
Pre FEV6/FVC Ratio: 100 %
RV % pred: 88 %
RV: 1.89 L
TLC % pred: 86 %
TLC: 4.44 L

## 2017-07-07 LAB — NITRIC OXIDE: Nitric Oxide: 11

## 2017-07-07 MED ORDER — PREDNISONE 10 MG PO TABS: ORAL_TABLET | ORAL | 0 refills | Status: DC

## 2017-07-07 MED ORDER — FLUTICASONE FUROATE-VILANTEROL 100-25 MCG/INH IN AEPB: 1.0000 | INHALATION_SPRAY | Freq: Every day | RESPIRATORY_TRACT | 0 refills | Status: AC

## 2017-07-07 NOTE — Progress Notes (Signed)
PFT done today. 

## 2017-07-07 NOTE — Progress Notes (Signed)
Lisa Miles    [id number]    [birth year]  Primary Care Physician:Stallings, Arlie Solomons, MD  Referring Physician: Forrest Moron, MD [place], [place] [address]  Chief complaint: Follow-up for cough  HPI: 67 year old with history of bronchitis, asthma.   C/O recurrent episodes of cough, wheezing, mucus production for the past 3 months.  She has been evaluated by primary care and given omeprazole, prednisone, Mucinex with no improvement in symptoms.  Seen in the emergency room on 04/07/18 with cough, mucus production.  She was treated with nebulizers with improvement in symptoms.  Scheduled to follow-up with ENT.  She had been given Prilosec in the past by primary care but has not helped much.  Has symptoms of nonproductive cough, itchy throat, postnasal drip.  Denies any allergy symptoms or acid reflux.  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  Immigrated to [place] in 1970s.  Lived in [place] since 1991.  Interim history: Has tried chlorpheniramine 4 mg qid, Dymista nasal spray and Prilosec twice daily without any improvement in symptoms She had PFTs today which were normal.  Outpatient Encounter Medications as of 07/07/2017  Medication Sig  . albuterol (PROVENTIL HFA;VENTOLIN HFA) 108 (90 Base) MCG/ACT inhaler Inhale 2 puffs every 6 (six) hours as needed into the lungs.  [name] Kitchen aspirin 81 MG tablet Take by mouth.  . Azelastine-Fluticasone (DYMISTA) 137-50 MCG/ACT SUSP Place 2 sprays into the nose daily.  . calcium-vitamin D (OSCAL WITH D) 250-125 MG-UNIT tablet Take by mouth.  . Cholecalciferol (VITAMIN D-3) 1000 UNITS CAPS Take 1 capsule by mouth daily.  [name] Kitchen estradiol (ESTRACE) 2 MG tablet Take 2 mg by mouth daily.  . hydrochlorothiazide (HYDRODIURIL) 25 MG tablet TAKE 1  TABLET(25 MG) BY MOUTH DAILY  . ibuprofen (ADVIL,MOTRIN) 600 MG tablet Take 1 tablet (600 mg total) by mouth every 8 (eight) hours as needed.  [name] Kitchen lisinopril (PRINIVIL,ZESTRIL) 20 MG tablet Take by mouth.  [name] Kitchen omeprazole (PRILOSEC) 20 MG capsule Take 1 capsule (20 mg total) by mouth 2 (two) times daily.  . benzonatate (TESSALON PERLES) 100 MG capsule Take 100 mg by mouth 3 (three) times daily as needed for cough.  . traMADol (ULTRAM) 50 MG tablet Take 1 tablet (50 mg total) by mouth every 6 (six) hours as needed. Reported on 08/20/2015 (Patient not taking: Reported on 07/07/2017)   No facility-administered encounter medications on file as of 07/07/2017.     Allergies as of 07/07/2017  . (No Known Allergies)    Past Medical History:  Diagnosis Date  . Aortic stenosis, mild 07/2012   Mild AS with Mod MR -(Mean/Peak Gradient 20 mmHg/34 mmHg).  . Asthma   . Bilateral bunions    Bunionectomies performed  . Bronchitis, chronic ([place])   . Heart disease   . Hyperlipidemia   . Hypertension    Good control  . Inguinodynia    Bilateral groin pain  . Lower extremity edema    Chronic. Venous Duplex 11/06/11 SUMMARY: 1) Bilateral Lower Extremities: No evidence of DVT or thrombophlebitis.  2) Right Common Femoral Vein: Demonstrated mild valvular insufficiency with a greater than (1) sec of duration. Mildly abnormal LE Venous duplex Doppler.  . Mitral regurgitation due to cusp prolapse 07/2012   Mild AMVL prolapse, Mod MR  .  Obesity   . Palpitations    Relatively well controlled  . Scoliosis    DG Chest 2 View x-ray on 07/30/11 by Dr. Everlene Farrier shows a scoliosis.    Past Surgical History:  Procedure Laterality Date  . ABDOMINAL HYSTERECTOMY    . ANKLE ARTHROSCOPY Left   . BUNIONECTOMY Bilateral   . KNEE ARTHROSCOPY Right   . Left knee open surgery    . TOTAL ABDOMINAL HYSTERECTOMY    . TRANSTHORACIC ECHOCARDIOGRAM  07/2012; 01/2013   a) 4/'14: mild-mod AS, Mod AI (mean/peak gradient: 18 mmHg / 31  mmhg).  Mild MVP w/ Mod MR, no MS.  Mildly elevated PAP (~37 mmHg) w/ Mod TR. EF 55-60%. GR 1 DD.;; b). 01/2013: EF 60-65%. No RWMA. Gr  DD. Mild AS (M / P Gradient 20 mmHg/34 mmHg). Mild AMVL Prolapse w/ Mod MR. Mod LA dilation. Mod TR.  [name] Kitchen TRANSTHORACIC ECHOCARDIOGRAM  01/2015; 01/2016   a) Normal LV size and function. GR 1 DD. Moderate LA Dilation. Calcified aortic valve with mild AS, moderate (2+) AI. Restricted posterior MV leaflet movement w/ Mod MR. Mode TR with mod elevated PA pressures (45 mmHg);; b) Relatively stable. Moderate concentric LVH. Moderate AI with mild AS (Mean-PeaK Gradient: 19 / 36 mmHg). Mild MR. Mod LA dilation. PAP ~40 mmHg -> plan follow-up 2020.    Family History  Problem Relation Age of Onset  . Arthritis Mother   . Hyperlipidemia Mother   . Diabetes Mother   . Gout Mother   . Hypertension Mother   . Cancer Father        GI cancer  . Diabetes Maternal Grandfather   . Hypertension Brother   . Diabetes Brother   . Hypertension Brother     Social History   Socioeconomic History  . Marital status: Married    Spouse name: Herbie Baltimore  . Number of children: 2  . Years of education: Not on file  . Highest education level: Not on file  Social Needs  . Financial resource strain: Not on file  . Food insecurity - worry: Not on file  . Food insecurity - inability: Not on file  . Transportation needs - medical: Not on file  . Transportation needs - non-medical: Not on file  Occupational History  . Occupation: production    Employer: [employer]    Comment: check printing/shipping  Tobacco Use  . Smoking status: Never Smoker  . Smokeless tobacco: Never Used  Substance and Sexual Activity  . Alcohol use: No  . Drug use: No  . Sexual activity: No  Other Topics Concern  . Not on file  Social History Narrative   Lives with her husband.  Their children live nearby.    Review of systems: Review of Systems  Constitutional: Negative for fever and chills.    HENT: Negative.   Eyes: Negative for blurred vision.  Respiratory: as per HPI  Cardiovascular: Negative for chest pain and palpitations.  Gastrointestinal: Negative for vomiting, diarrhea, blood per rectum. Genitourinary: Negative for dysuria, urgency, frequency and hematuria.  Musculoskeletal: Negative for myalgias, back pain and joint pain.  Skin: Negative for itching and rash.  Neurological: Negative for dizziness, tremors, focal weakness, seizures and loss of consciousness.  Endo/Heme/Allergies: Negative for environmental allergies.  Psychiatric/Behavioral: Negative for depression, suicidal ideas and hallucinations.  All other systems reviewed and are negative.  Physical Exam: Blood pressure 126/76, pulse 87, height 5\' 8"  (1.727 m), weight 227 lb (103 kg), SpO2 95 %. Gen:  No acute distress HEENT:  EOMI, sclera anicteric Neck:     No masses; no thyromegaly Lungs:    Clear to auscultation bilaterally; normal respiratory effort CV:         Regular rate and rhythm; no murmurs Abd:      + bowel sounds; soft, non-tender; no palpable masses, no distension Ext:    No edema; adequate peripheral perfusion Skin:      Warm and dry; no rash Neuro: alert and oriented x 3 Psych: normal mood and affect  Data Reviewed: CT abdomen pelvis 07/29/12-visualized lung bases are clear with no abnormalities Chest x-ray 06/08/17-mild interstitial markings, no acute lung abnormality. I have reviewed the images personally  FENO 06/09/17-8 FENO 07/03/17-11  Echocardiogram 02/05/16-moderate concentric LVH, mild aortic stenosis, moderate aortic regurgitation, mild mitral regurgitation. PA peak pressure of 48 mm.  PFTs 07/07/17 FVC 2.33 [92%], FEV1 2.14 [108%], F/F 92, TLC 86%, DLCO 79% Normal test  Assessment:  Evaluation for chronic cough Adequately treated for postnasal drip and GERD with ongoing symptoms.  She is on ACE inhibitor but reports that she has been on this medication for the past 10 years  without any problem.    PFTs reviewed which are normal.  Even if suspicion for asthma is low, we will try empiric prednisone and Breo inhaler for 2 weeks If the cough persists then I have asked her to contact Dr. Ellyn Hack to see if the ACE inhibitor can be switched to a different medication.  Plan/Recommendations: - Pred taper starting at 40 mg.  Reduce dose by 10 mg every 2 days - Breo inhaler for 2 weeks - Consider replacing ACE inhibitor.  Marshell Garfinkel MD [facility] Pager [phone] 07/07/2017, 2:37 PM  CC: Forrest Moron, MD

## 2017-07-07 NOTE — Patient Instructions (Addendum)
We will give a prednisone taper starting at 40 mg.  Reduce dose by 10 mg every 2 days We will give you a sample of breo 100.  Try this for 2 weeks If your cough continues then call your cardiologist and see if you can get off the lisinopril.

## 2017-08-06 DIAGNOSIS — H2513 Age-related nuclear cataract, bilateral: Secondary | ICD-10-CM | POA: Diagnosis not present

## 2017-08-06 DIAGNOSIS — H524 Presbyopia: Secondary | ICD-10-CM | POA: Diagnosis not present

## 2017-08-06 DIAGNOSIS — H52223 Regular astigmatism, bilateral: Secondary | ICD-10-CM | POA: Diagnosis not present

## 2017-08-06 DIAGNOSIS — Z961 Presence of intraocular lens: Secondary | ICD-10-CM | POA: Diagnosis not present

## 2017-08-17 DIAGNOSIS — Z1231 Encounter for screening mammogram for malignant neoplasm of breast: Secondary | ICD-10-CM | POA: Diagnosis not present

## 2017-08-28 ENCOUNTER — Other Ambulatory Visit: Payer: Self-pay | Admitting: Cardiology

## 2017-08-30 NOTE — Telephone Encounter (Signed)
REFILL 

## 2017-09-02 ENCOUNTER — Encounter: Payer: Self-pay | Admitting: Cardiology

## 2017-09-02 ENCOUNTER — Ambulatory Visit (INDEPENDENT_AMBULATORY_CARE_PROVIDER_SITE_OTHER): Payer: PPO | Admitting: Cardiology

## 2017-09-02 VITALS — BP 115/68 | HR 64 | Ht 64.5 in | Wt 227.0 lb

## 2017-09-02 DIAGNOSIS — R05 Cough: Secondary | ICD-10-CM

## 2017-09-02 DIAGNOSIS — I10 Essential (primary) hypertension: Secondary | ICD-10-CM

## 2017-09-02 DIAGNOSIS — I359 Nonrheumatic aortic valve disorder, unspecified: Secondary | ICD-10-CM | POA: Diagnosis not present

## 2017-09-02 DIAGNOSIS — R002 Palpitations: Secondary | ICD-10-CM | POA: Diagnosis not present

## 2017-09-02 DIAGNOSIS — R059 Cough, unspecified: Secondary | ICD-10-CM

## 2017-09-02 DIAGNOSIS — I340 Nonrheumatic mitral (valve) insufficiency: Secondary | ICD-10-CM | POA: Diagnosis not present

## 2017-09-02 MED ORDER — VALSARTAN 160 MG PO TABS: 160.0000 mg | ORAL_TABLET | Freq: Every day | ORAL | 3 refills | Status: DC

## 2017-09-02 NOTE — Progress Notes (Signed)
PCP: Forrest Moron, MD  Clinic Note: Chief Complaint  Patient presents with  . Follow-up  . Cardiac Valve Problem  . Cough    HPI: Lisa Miles is a 67 y.o. female with a PMH below who presents today for annual follow-up for valvular heart disease (mild aortic stenosis and mitral regurgitation with mild prolapse.  This is associated with hypertension, hyperlipidemia and palpitations..  Last echocardiogram from October 2017 showed moderate LVH.  Moderate AI with mild (AS - mean gradient19 mmHg) .  Moderate LA dilation.  Mild MR  Lisa Miles was last seen on August 04, 2016.  She is doing very well occasionally feeling some irregular heartbeats but nothing prolonged.  No dyspnea or chest pain symptoms.   Recent Hospitalizations: ER visit for bronchitis in February  Studies Personally Reviewed - (if available, images/films reviewed: From Epic Chart or Care Everywhere)  No new studies  Interval History: Lisa Miles returns today overall doing quite well.  She has some mild trace ankle edema which is pretty stable.  Her major concern today is that she still has this nagging cough on since her bronchitis episode back in February.  Sometimes a productive, and other times is not.  She has no fevers or chills to suggest ongoing illness.  Despite having a frequent cough, she did not have any chest tightness or pressure or dyspnea with either rest or exertion.  She does however note some palpitations but otherwise not.  She still  does what ever she wants to do without complaint or concern.  No chest pain or shortness of breath with rest or exertion. No PND, orthopnea or edema. No palpitations, lightheadedness, dizziness, weakness or syncope/near syncope. No TIA/amaurosis fugax symptoms.   No claudication.  ROS: A comprehensive was performed. Pertinent symptoms noted above Review of Systems  Constitutional: Negative for chills, fever, malaise/fatigue and weight loss.  HENT: Negative  for congestion and nosebleeds.   Respiratory: Positive for cough and sputum production (Only whitish sputum). Negative for shortness of breath and wheezing.   Gastrointestinal: Negative for abdominal pain, blood in stool, constipation, heartburn and melena.  Genitourinary: Negative for hematuria.  Musculoskeletal: Negative for joint pain.  Neurological: Negative for dizziness, tingling, tremors and weakness.  Psychiatric/Behavioral: Negative.   All other systems reviewed and are negative.   I have reviewed and (if needed) personally updated the patient's problem list, medications, allergies, past medical and surgical history, social and family history.   Past Medical History:  Diagnosis Date  . Aortic stenosis, mild 07/2012   Mild AS with Mod MR -(Mean/Peak Gradient 20 mmHg/34 mmHg).  . Asthma   . Bilateral bunions    Bunionectomies performed  . Bronchitis, chronic ([place])   . Heart disease   . Hyperlipidemia   . Hypertension    Good control  . Inguinodynia    Bilateral groin pain  . Lower extremity edema    Chronic. Venous Duplex 11/06/11 SUMMARY: 1) Bilateral Lower Extremities: No evidence of DVT or thrombophlebitis.  2) Right Common Femoral Vein: Demonstrated mild valvular insufficiency with a greater than (1) sec of duration. Mildly abnormal LE Venous duplex Doppler.  . Mitral regurgitation due to cusp prolapse 07/2012   Mild AMVL prolapse, Mod MR  . Obesity   . Palpitations    Relatively well controlled  . Scoliosis    DG Chest 2 View x-ray on 07/30/11 by Dr. Everlene Farrier shows a scoliosis.    Past Surgical History:  Procedure Laterality Date  .  ABDOMINAL HYSTERECTOMY    . ANKLE ARTHROSCOPY Left   . BUNIONECTOMY Bilateral   . KNEE ARTHROSCOPY Right   . Left knee open surgery    . TOTAL ABDOMINAL HYSTERECTOMY    . TRANSTHORACIC ECHOCARDIOGRAM  07/2012; 01/2013   a) 4/'14: mild-mod AS, Mod AI (mean/peak gradient: 18 mmHg / 31 mmhg).  Mild MVP w/ Mod MR, no MS.  Mildly elevated PAP  (~37 mmHg) w/ Mod TR. EF 55-60%. GR 1 DD.;; b). 01/2013: EF 60-65%. No RWMA. Gr  DD. Mild AS (M / P Gradient 20 mmHg/34 mmHg). Mild AMVL Prolapse w/ Mod MR. Mod LA dilation. Mod TR.  [name] Kitchen TRANSTHORACIC ECHOCARDIOGRAM  01/2015; 01/2016   a) Normal LV size and function. GR 1 DD. Moderate LA Dilation. Calcified aortic valve with mild AS, moderate (2+) AI. Restricted posterior MV leaflet movement w/ Mod MR. Mode TR with mod elevated PA pressures (45 mmHg);; b) Relatively stable. Moderate concentric LVH. Moderate AI with mild AS (Mean-PeaK Gradient: 19 / 36 mmHg). Mild MR. Mod LA dilation. PAP ~40 mmHg -> plan follow-up 2020.    Current Meds  Medication Sig  . albuterol (PROVENTIL HFA;VENTOLIN HFA) 108 (90 Base) MCG/ACT inhaler Inhale 2 puffs every 6 (six) hours as needed into the lungs.  [name] Kitchen aspirin 81 MG tablet Take by mouth.  . Azelastine-Fluticasone (DYMISTA) 137-50 MCG/ACT SUSP Place 2 sprays into the nose daily.  . benzonatate (TESSALON PERLES) 100 MG capsule Take 100 mg by mouth 3 (three) times daily as needed for cough.  . calcium-vitamin D (OSCAL WITH D) 250-125 MG-UNIT tablet Take by mouth.  . Cholecalciferol (VITAMIN D-3) 1000 UNITS CAPS Take 1 capsule by mouth daily.  [name] Kitchen estradiol (ESTRACE) 2 MG tablet Take 2 mg by mouth daily.  . hydrochlorothiazide (HYDRODIURIL) 25 MG tablet TAKE 1 TABLET(25 MG) BY MOUTH DAILY  . ibuprofen (ADVIL,MOTRIN) 600 MG tablet Take 1 tablet (600 mg total) by mouth every 8 (eight) hours as needed.  [name] Kitchen omeprazole (PRILOSEC) 20 MG capsule Take 1 capsule (20 mg total) by mouth 2 (two) times daily.  . [DISCONTINUED] lisinopril (PRINIVIL,ZESTRIL) 20 MG tablet TAKE 1 TABLET(20 MG) BY MOUTH DAILY    No Known Allergies  Social History   Tobacco Use  . Smoking status: Never Smoker  . Smokeless tobacco: Never Used  Substance Use Topics  . Alcohol use: No  . Drug use: No   Social History   Social History Narrative   Lives with her husband.  Their children live nearby.   - Retired - end of 2017.  family history includes Arthritis in her mother; Cancer in her father; Diabetes in her brother, maternal grandfather, and mother; Gout in her mother; Hyperlipidemia in her mother; Hypertension in her brother, brother, and mother.  Wt Readings from Last 3 Encounters:  09/02/17 227 lb (103 kg)  07/07/17 227 lb (103 kg)  06/09/17 227 lb (103 kg)    PHYSICAL EXAM BP 115/68   Pulse 64   Ht 5' 4.5" (1.638 m)   Wt 227 lb (103 kg)   BMI 38.36 kg/m  Physical Exam  Constitutional: She is oriented to [place], place, and time. She appears well-developed and well-nourished. No distress.  Moderately obese.  Well-groomed  HENT:  Head: Normocephalic and atraumatic.  Neck: No hepatojugular reflux and no JVD present. Carotid bruit is not present (Radiated aortic valve murmur).  Cardiovascular: Normal rate, regular rhythm and intact distal pulses.  Occasional extrasystoles are present. PMI is not displaced. Exam reveals decreased pulses (  difficult to palpate because of body habitus). Exam reveals no gallop and no friction rub.  Murmur heard. High-pitched blowing decrescendo holosystolic murmur is present with a grade of 1/6 at the lower left sternal border and apex radiating to the axilla. Pulmonary/Chest: Effort normal and breath sounds normal. No respiratory distress. She has no wheezes. She has no rales.  Abdominal: Soft. Bowel sounds are normal. She exhibits no distension. There is no tenderness. There is no rebound.  Unable to palpate HSM due to body habitus  Musculoskeletal: Normal range of motion. She exhibits edema (Trivial).  Neurological: She is alert and oriented to [place], place, and time.  Psychiatric: She has a normal mood and affect. Her behavior is normal. Judgment and thought content normal.     Adult ECG Report  Rate: 64;  Rhythm: normal sinus rhythm and Normal axis, intervals and durations.;   Narrative Interpretation: Normal EKG   Other studies  Reviewed: Additional studies/ records that were reviewed today include:  Recent Labs:   Lab Results  Component Value Date   CHOL 201 (H) 07/22/2016   HDL 63 07/22/2016   LDLCALC 112 (H) 07/22/2016   TRIG 129 07/22/2016   CHOLHDL 3.2 07/22/2016    ASSESSMENT / PLAN: Problem List Items Addressed This Visit    Mitral regurgitation due to cusp prolapse - moderate MR with mild prolapse - Primary (Chronic)    Mild MR & MVP on exam & Echo => due for f/u Echo this October.  Recommend SBE prophylaxis for GI-Endo & Dental procedures.      Relevant Medications   valsartan (DIOVAN) 160 MG tablet   Other Relevant Orders   EKG 12-Lead (Completed)   ECHOCARDIOGRAM COMPLETE   Heart palpitations    Stable - - may be feeling her temporal arterial pulse on her pillow.   Also feels her heart "skip" on occasion when coughing.  PACs noted previously on exam -- likely benign.      Relevant Orders   EKG 12-Lead (Completed)   Essential hypertension (Chronic)    Stable on current meds -- converting from ACE-I to ARB for cough.      Relevant Medications   valsartan (DIOVAN) 160 MG tablet   Cough    Persistent cough --> change from ACE-I to ARB (Valsartan 160mg )      Aortic valve disorder (Chronic)    Mild AS on recent echo - due for recheck this October.      Relevant Medications   valsartan (DIOVAN) 160 MG tablet   Other Relevant Orders   EKG 12-Lead (Completed)   ECHOCARDIOGRAM COMPLETE      I spent a total of 84minutes with the patient and chart review. >  50% of the time was spent in direct patient consultation.   Current medicines are reviewed at length with the patient today.  (+/- concerns) cough The following changes have been made:  see below  Patient Instructions  Medication instruction Stop lisinopril   Start valsartan 160 mg one tablet daily.    Schedule in OCT 2019  - [place] [address]. Your physician has requested that you have an  echocardiogram. Echocardiography is a painless test that uses sound waves to create images of your heart. It provides your doctor with information about the size and shape of your heart and how well your heart's chambers and valves are working. This procedure takes approximately one hour. There are no restrictions for this procedure.    Your physician wants you  to follow-up in 12 month with Dr Ellyn Hack. You will receive a reminder letter in the mail two months in advance. If you don't receive a letter, please call our office to schedule the follow-up appointment.   If you need a refill on your cardiac medications before your next appointment, please call your pharmacy.     Studies Ordered:   Orders Placed This Encounter  Procedures  . EKG 12-Lead  . ECHOCARDIOGRAM COMPLETE      Glenetta Hew, M.D., M.S. Interventional Cardiologist   Pager # [phone] Phone # [phone] [address] [place], [place] [address]   Thank you for choosing [facility] at [facility]!!

## 2017-09-02 NOTE — Patient Instructions (Signed)
Medication instruction Stop lisinopril   Start valsartan 160 mg one tablet daily.    Schedule in OCT 2019  - [place] [address]. Your physician has requested that you have an echocardiogram. Echocardiography is a painless test that uses sound waves to create images of your heart. It provides your doctor with information about the size and shape of your heart and how well your heart's chambers and valves are working. This procedure takes approximately one hour. There are no restrictions for this procedure.    Your physician wants you to follow-up in 12 month with Dr Ellyn Hack. You will receive a reminder letter in the mail two months in advance. If you don't receive a letter, please call our office to schedule the follow-up appointment.   If you need a refill on your cardiac medications before your next appointment, please call your pharmacy.

## 2017-09-06 ENCOUNTER — Encounter: Payer: Self-pay | Admitting: Cardiology

## 2017-09-06 NOTE — Assessment & Plan Note (Signed)
Mild MR & MVP on exam & Echo => due for f/u Echo this October.  Recommend SBE prophylaxis for GI-Endo & Dental procedures.

## 2017-09-06 NOTE — Assessment & Plan Note (Signed)
Persistent cough --> change from ACE-I to ARB (Valsartan 160mg )

## 2017-09-06 NOTE — Assessment & Plan Note (Signed)
Stable on current meds -- converting from ACE-I to ARB for cough.

## 2017-09-06 NOTE — Assessment & Plan Note (Signed)
Stable - - may be feeling her temporal arterial pulse on her pillow.   Also feels her heart "skip" on occasion when coughing.  PACs noted previously on exam -- likely benign.

## 2017-09-06 NOTE — Assessment & Plan Note (Signed)
Mild AS on recent echo - due for recheck this October.

## 2017-10-12 ENCOUNTER — Ambulatory Visit: Admitting: Pulmonary Disease

## 2017-10-21 ENCOUNTER — Other Ambulatory Visit: Payer: Self-pay

## 2017-10-21 ENCOUNTER — Ambulatory Visit (INDEPENDENT_AMBULATORY_CARE_PROVIDER_SITE_OTHER): Payer: PPO | Admitting: Urgent Care

## 2017-10-21 ENCOUNTER — Encounter: Payer: Self-pay | Admitting: Urgent Care

## 2017-10-21 ENCOUNTER — Ambulatory Visit (INDEPENDENT_AMBULATORY_CARE_PROVIDER_SITE_OTHER): Payer: PPO

## 2017-10-21 VITALS — BP 120/64 | HR 74 | Temp 98.3°F | Resp 17 | Ht 64.5 in | Wt 224.6 lb

## 2017-10-21 DIAGNOSIS — M7989 Other specified soft tissue disorders: Secondary | ICD-10-CM

## 2017-10-21 DIAGNOSIS — H579 Unspecified disorder of eye and adnexa: Secondary | ICD-10-CM

## 2017-10-21 DIAGNOSIS — E860 Dehydration: Secondary | ICD-10-CM | POA: Diagnosis not present

## 2017-10-21 DIAGNOSIS — R51 Headache: Secondary | ICD-10-CM | POA: Diagnosis not present

## 2017-10-21 DIAGNOSIS — M25471 Effusion, right ankle: Secondary | ICD-10-CM | POA: Diagnosis not present

## 2017-10-21 DIAGNOSIS — R739 Hyperglycemia, unspecified: Secondary | ICD-10-CM

## 2017-10-21 DIAGNOSIS — Z1231 Encounter for screening mammogram for malignant neoplasm of breast: Secondary | ICD-10-CM

## 2017-10-21 DIAGNOSIS — R519 Headache, unspecified: Secondary | ICD-10-CM

## 2017-10-21 DIAGNOSIS — Z1239 Encounter for other screening for malignant neoplasm of breast: Secondary | ICD-10-CM

## 2017-10-21 NOTE — Patient Instructions (Addendum)
Hydrate well with at least 2 liters (64 ounces of water, 1 gallon) of water daily. You may take 500mg  Tylenol every 6 hours for headaches.      Dehydration, Adult Dehydration is a condition in which there is not enough fluid or water in the body. This happens when you lose more fluids than you take in. Important organs, such as the kidneys, brain, and heart, cannot function without a proper amount of fluids. Any loss of fluids from the body can lead to dehydration. Dehydration can range from mild to severe. This condition should be treated right away to prevent it from becoming severe. What are the causes? This condition may be caused by:  Vomiting.  Diarrhea.  Excessive sweating, such as from heat exposure or exercise.  Not drinking enough fluid, especially: ? When ill. ? While doing activity that requires a lot of energy.  Excessive urination.  Fever.  Infection.  Certain medicines, such as medicines that cause the body to lose excess fluid (diuretics).  Inability to access safe drinking water.  Reduced physical ability to get adequate water and food.  What increases the risk? This condition is more likely to develop in people:  Who have a poorly controlled long-term (chronic) illness, such as diabetes, heart disease, or kidney disease.  Who are age 64 or older.  Who are disabled.  Who live in a place with high altitude.  Who play endurance sports.  What are the signs or symptoms? Symptoms of mild dehydration may include:  Thirst.  Dry lips.  Slightly dry mouth.  Dry, warm skin.  Dizziness. Symptoms of moderate dehydration may include:  Very dry mouth.  Muscle cramps.  Dark urine. Urine may be the color of tea.  Decreased urine production.  Decreased tear production.  Heartbeat that is irregular or faster than normal (palpitations).  Headache.  Light-headedness, especially when you stand up from a sitting position.  Fainting  (syncope). Symptoms of severe dehydration may include:  Changes in skin, such as: ? Cold and clammy skin. ? Blotchy (mottled) or pale skin. ? Skin that does not quickly return to normal after being lightly pinched and released (poor skin turgor).  Changes in body fluids, such as: ? Extreme thirst. ? No tear production. ? Inability to sweat when body temperature is high, such as in hot weather. ? Very little urine production.  Changes in vital signs, such as: ? Weak pulse. ? Pulse that is more than 100 beats a minute when sitting still. ? Rapid breathing. ? Low blood pressure.  Other changes, such as: ? Sunken eyes. ? Cold hands and feet. ? Confusion. ? Lack of energy (lethargy). ? Difficulty waking up from sleep. ? Short-term weight loss. ? Unconsciousness. How is this diagnosed? This condition is diagnosed based on your symptoms and a physical exam. Blood and urine tests may be done to help confirm the diagnosis. How is this treated? Treatment for this condition depends on the severity. Mild or moderate dehydration can often be treated at home. Treatment should be started right away. Do not wait until dehydration becomes severe. Severe dehydration is an emergency and it needs to be treated in a hospital. Treatment for mild dehydration may include:  Drinking more fluids.  Replacing salts and minerals in your blood (electrolytes) that you may have lost. Treatment for moderate dehydration may include:  Drinking an oral rehydration solution (ORS). This is a drink that helps you replace fluids and electrolytes (rehydrate). It can be found at pharmacies  and retail stores. Treatment for severe dehydration may include:  Receiving fluids through an IV tube.  Receiving an electrolyte solution through a feeding tube that is passed through your nose and into your stomach (nasogastric tube, or NG tube).  Correcting any abnormalities in electrolytes.  Treating the underlying cause  of dehydration. Follow these instructions at home:  If directed by your health care provider, drink an ORS: ? Make an ORS by following instructions on the package. ? Start by drinking small amounts, about  cup (120 mL) every 5-10 minutes. ? Slowly increase how much you drink until you have taken the amount recommended by your health care provider.  Drink enough clear fluid to keep your urine clear or pale yellow. If you were told to drink an ORS, finish the ORS first, then start slowly drinking other clear fluids. Drink fluids such as: ? Water. Do not drink only water. Doing that can lead to having too little salt (sodium) in the body (hyponatremia). ? Ice chips. ? Fruit juice that you have added water to (diluted fruit juice). ? Low-calorie sports drinks.  Avoid: ? Alcohol. ? Drinks that contain a lot of sugar. These include high-calorie sports drinks, fruit juice that is not diluted, and soda. ? Caffeine. ? Foods that are greasy or contain a lot of fat or sugar.  Take over-the-counter and prescription medicines only as told by your health care provider.  Do not take sodium tablets. This can lead to having too much sodium in the body (hypernatremia).  Eat foods that contain a healthy balance of electrolytes, such as bananas, oranges, potatoes, tomatoes, and spinach.  Keep all follow-up visits as told by your health care provider. This is important. Contact a health care provider if:  You have abdominal pain that: ? Gets worse. ? Stays in one area (localizes).  You have a rash.  You have a stiff neck.  You are more irritable than usual.  You are sleepier or more difficult to wake up than usual.  You feel weak or dizzy.  You feel very thirsty.  You have urinated only a small amount of very dark urine over 6-8 hours. Get help right away if:  You have symptoms of severe dehydration.  You cannot drink fluids without vomiting.  Your symptoms get worse with  treatment.  You have a fever.  You have a severe headache.  You have vomiting or diarrhea that: ? Gets worse. ? Does not go away.  You have blood or green matter (bile) in your vomit.  You have blood in your stool. This may cause stool to look black and tarry.  You have not urinated in 6-8 hours.  You faint.  Your heart rate while sitting still is over 100 beats a minute.  You have trouble breathing. This information is not intended to replace advice given to you by your health care provider. Make sure you discuss any questions you have with your health care provider. Document Released: 04/13/2005 Document Revised: 11/08/2015 Document Reviewed: 06/07/2015 Elsevier Interactive Patient Education  2018 [place] Headache Without Cause A headache is pain or discomfort felt around the head or neck area. There are many causes and types of headaches. In some cases, the cause may not be found. Follow these instructions at home: Managing pain  Take over-the-counter and prescription medicines only as told by your doctor.  Lie down in a dark, quiet room when you have a headache.  If directed,  apply ice to the head and neck area: ? Put ice in a plastic bag. ? Place a towel between your skin and the bag. ? Leave the ice on for 20 minutes, 2-3 times per day.  Use a heating pad or hot shower to apply heat to the head and neck area as told by your doctor.  Keep lights dim if bright lights bother you or make your headaches worse. Eating and drinking  Eat meals on a regular schedule.  Lessen how much alcohol you drink.  Lessen how much caffeine you drink, or stop drinking caffeine. General instructions  Keep all follow-up visits as told by your doctor. This is important.  Keep a journal to find out if certain things bring on headaches. For example, write down: ? What you eat and drink. ? How much sleep you get. ? Any change to your diet or  medicines.  Relax by getting a massage or doing other relaxing activities.  Lessen stress.  Sit up straight. Do not tighten (tense) your muscles.  Do not use tobacco products. This includes cigarettes, chewing tobacco, or e-cigarettes. If you need help quitting, ask your doctor.  Exercise regularly as told by your doctor.  Get enough sleep. This often means 7-9 hours of sleep. Contact a doctor if:  Your symptoms are not helped by medicine.  You have a headache that feels different than the other headaches.  You feel sick to your stomach (nauseous) or you throw up (vomit).  You have a fever. Get help right away if:  Your headache becomes really bad.  You keep throwing up.  You have a stiff neck.  You have trouble seeing.  You have trouble speaking.  You have pain in the eye or ear.  Your muscles are weak or you lose muscle control.  You lose your balance or have trouble walking.  You feel like you will pass out (faint) or you pass out.  You have confusion. This information is not intended to replace advice given to you by your health care provider. Make sure you discuss any questions you have with your health care provider. Document Released: 01/21/2008 Document Revised: 09/19/2015 Document Reviewed: 08/06/2014 Elsevier Interactive Patient Education  2018 [employer].     IF you received an x-ray today, you will receive an invoice from [facility]. Please contact [facility] at [phone] with questions or concerns regarding your invoice.   IF you received labwork today, you will receive an invoice from [facility]. Please contact [facility] at [phone] with questions or concerns regarding your invoice.   Our billing staff will not be able to assist you with questions regarding bills from these companies.  You will be contacted with the lab results as soon as they are available. The fastest way to get your results is to activate your My  Chart account. Instructions are located on the last page of this paperwork. If you have not heard from [place] regarding the results in 2 weeks, please contact this office.

## 2017-10-21 NOTE — Progress Notes (Signed)
MRN: [id number] DOB: Aug 28, 1950  Subjective:   Lisa Miles is a 67 y.o. female presenting for 2 week history of bilateral pressure behind her eyes, frontal-temporal headache. Feels blurred vision still despite having new glasses, eye exam was ~1 month ago. Denies sinus congestion, runny nose, confusion, dizzy, sore throat, cough, ear pain, difficulty with slurred speech, balance. Has not tried any medications for relief. Has ~8 ounces of water daily. Has grape and grapefruit juice. Does not drink alcohol. Denies smoking cigarettes.  Presenting for follow-up.  Patient's last office visit was 10/15/2017 with Dr. Carlota Raspberry.  Patient would also like to have her blood sugar checked she had a previous elevated blood sugar reading.  She also reported right lower leg swelling.  Admits that she does not move around a lot, has primarily a sedentary lifestyle.  Lisa Miles has a current medication list which includes the following prescription(s): calcium-vitamin d, vitamin d-3, estradiol, hydrochlorothiazide, albuterol, aspirin, azelastine-fluticasone, benzonatate, ibuprofen, omeprazole, and valsartan. Also has No Known Allergies.  Lisa Miles  has a past medical history of Aortic stenosis, mild (07/2012), Asthma, Bilateral bunions, Bronchitis, chronic ([place]), Heart disease, Hyperlipidemia, Hypertension, Inguinodynia, Lower extremity edema, Mitral regurgitation due to cusp prolapse (07/2012), Obesity, Palpitations, and Scoliosis. Also  has a past surgical history that includes Bunionectomy (Bilateral); Knee arthroscopy (Right); Abdominal hysterectomy; Ankle arthroscopy (Left); Left knee open surgery; Total abdominal hysterectomy; transthoracic echocardiogram (07/2012; 01/2013); and transthoracic echocardiogram (01/2015; 01/2016).  Objective:   Vitals: BP 120/64 (BP Location: Left Arm, Patient Position: Sitting, Cuff Size: Large)   Pulse 74   Temp 98.3 F (36.8 C) (Oral)   Resp 17   Ht 5' 4.5" (1.638 m)   Wt 224 lb  9.6 oz (101.9 kg)   SpO2 99%   BMI 37.96 kg/m   Physical Exam  Constitutional: She is oriented to [place], place, and time. She appears well-developed and well-nourished.  HENT:  Mouth/Throat: Oropharynx is clear and moist.  Eyes: Pupils are equal, round, and reactive to light. EOM are normal. Right eye exhibits no discharge. Left eye exhibits no discharge. No scleral icterus.  Neck: Normal range of motion. Neck supple. No thyromegaly present.  Cardiovascular: Normal rate, regular rhythm and intact distal pulses. Exam reveals no gallop and no friction rub.  No murmur heard. Pulmonary/Chest: No stridor. No respiratory distress. She has no wheezes. She has no rales. She exhibits no tenderness.  Musculoskeletal: She exhibits edema (right ankle, proximal/dorsal aspect of right foot).  Neurological: She is alert and oriented to [place], place, and time. She displays normal reflexes. No cranial nerve deficit. Coordination normal.  Negative pronator drift and Romberg test.  Speech is intact.  Skin: Skin is warm and dry.  Psychiatric: She has a normal mood and affect.   Dg Ankle Complete Right  Result Date: 10/21/2017 CLINICAL DATA:  Unprovoked right foot and ankle swelling EXAM: RIGHT ANKLE - COMPLETE 3+ VIEW COMPARISON:  None. FINDINGS: Soft tissue swelling without underlying fracture, erosion, or malalignment. No degenerative joint narrowing or spurring. IMPRESSION: Soft tissue swelling without osseous abnormality. Electronically Signed   By: Monte Fantasia M.D.   On: 10/21/2017 12:48   Dg Foot Complete Right  Result Date: 10/21/2017 CLINICAL DATA:  Unprovoked right ankle and foot swelling EXAM: RIGHT FOOT COMPLETE - 3+ VIEW COMPARISON:  None. FINDINGS: Soft tissue swelling about the hindfoot. No fracture or malalignment. No erosion or soft tissue gas. Hallux valgus with first metatarsal head ostectomy. IMPRESSION: Soft tissue swelling without acute osseous finding. Electronically  Signed   By:  Monte Fantasia M.D.   On: 10/21/2017 12:50    Assessment and Plan :   Elevated blood sugar - Plan: Hemoglobin A1c  Breast cancer screening - Plan: MM Digital Screening  Eye pressure  Generalized headaches  Dehydration  Swelling of lower leg  Ankle swelling, right - Plan: DG Ankle Complete Right, DG Foot Complete Right  Swelling of right foot - Plan: DG Ankle Complete Right, DG Foot Complete Right  Counseled patient that she likely has her headaches and head pressure from lack of hydration as she only does 8 ounces of water per day.  Her physical exam including neurologic exam is perfectly normal.  I reassured patient in this regard, she is to schedule Tylenol for headache while she tries to hydrate better.  X-rays were positive for soft tissue swelling only.  I do not suspect gout either with this patient given that there is no pain, redness or warmth.  Patient will try to increase her level of activity and wear compression stockings.  Return to clinic precautions discussed.  Otherwise follow-up with PCP, Dr. Nolon Rod.  Jaynee Eagles, PA-C [facility] [phone] 10/21/2017  11:42 AM

## 2017-10-22 LAB — HEMOGLOBIN A1C
Est. average glucose Bld gHb Est-mCnc: 114 mg/dL
Hgb A1c MFr Bld: 5.6 % (ref 4.8–5.6)

## 2017-11-29 ENCOUNTER — Other Ambulatory Visit: Payer: Self-pay | Admitting: Cardiology

## 2018-01-25 HISTORY — PX: TRANSTHORACIC ECHOCARDIOGRAM: SHX275

## 2018-02-02 ENCOUNTER — Other Ambulatory Visit: Payer: Self-pay

## 2018-02-02 ENCOUNTER — Ambulatory Visit (HOSPITAL_COMMUNITY): Payer: PPO | Attending: Cardiovascular Disease

## 2018-02-02 DIAGNOSIS — I340 Nonrheumatic mitral (valve) insufficiency: Secondary | ICD-10-CM | POA: Diagnosis not present

## 2018-02-02 DIAGNOSIS — I359 Nonrheumatic aortic valve disorder, unspecified: Secondary | ICD-10-CM | POA: Diagnosis not present

## 2018-02-04 ENCOUNTER — Other Ambulatory Visit: Payer: Self-pay | Admitting: *Deleted

## 2018-02-04 DIAGNOSIS — I359 Nonrheumatic aortic valve disorder, unspecified: Secondary | ICD-10-CM

## 2018-02-04 DIAGNOSIS — I340 Nonrheumatic mitral (valve) insufficiency: Secondary | ICD-10-CM

## 2018-02-04 NOTE — Progress Notes (Signed)
Notes recorded by Leonie Man, MD on 02/02/2018 at 5:54 PM EDT Echocardiogram result: ESSENTIALLY STABLE COMPARED TO LAST ECHOCARDIOGRAM. No major changes.  Normal pump function with an ejection fraction of 60 to 65%. The heart is moderately thickened, and does have abnormal relaxation which is normal for age with high blood pressure. Aortic valve still has mild stenosis with moderate regurgitation (mean gradient of 16 mmHg) Mitral valve has only mild regurgitation. The left atrium is severely dilated which goes along with having abnormal relaxation and aortic valve disease.  -We can continue to check this type of study follow-up every couple years. Glenetta Hew, MD  ORDER PLACED FOR ECHO IN 2 YEARS

## 2018-03-31 NOTE — Progress Notes (Signed)
Chief Complaint  Patient presents with  . body pain intermittent and back pain is more consistent x  m    ankle swelling right ankle, frequent urination    HPI  Vision changes Patient reports that she has visual acuity is worsening She states that she got new prescription March 2019 She reports now she is sees floaters She states that she is concerned about diabetes  Urinary Frequency She states that she also has increase urination as well  She states that she goes to the bathroom   Heart Murmurs She reports that she has not seen her heart doctor  She has a history of mild AS and moderate MR She reports that sometimes she hears her murmur and has to roll over in bed She denies shortness of breath  Chronic Throat Clearing She reports chronic throat clearing and it can precipitate a cough She reports that it is all day long and does not seem to improve She states that she sounds like a car engine She reports that omeprazole helped but it did not resolve      Past Medical History:  Diagnosis Date  . Aortic stenosis, mild 07/2012   Mild AS with Mod MR -(Mean/Peak Gradient 20 mmHg/34 mmHg).  . Asthma   . Bilateral bunions    Bunionectomies performed  . Bronchitis, chronic ([place])   . Heart disease   . Hyperlipidemia   . Hypertension    Good control  . Inguinodynia    Bilateral groin pain  . Lower extremity edema    Chronic. Venous Duplex 11/06/11 SUMMARY: 1) Bilateral Lower Extremities: No evidence of DVT or thrombophlebitis.  2) Right Common Femoral Vein: Demonstrated mild valvular insufficiency with a greater than (1) sec of duration. Mildly abnormal LE Venous duplex Doppler.  . Mitral regurgitation due to cusp prolapse 07/2012   Mild AMVL prolapse, Mod MR  . Obesity   . Palpitations    Relatively well controlled  . Scoliosis    DG Chest 2 View x-ray on 07/30/11 by Dr. Everlene Miles shows a scoliosis.    Current Outpatient Medications  Medication Sig Dispense Refill  .  aspirin 81 MG tablet Take by mouth.    . Azelastine-Fluticasone (DYMISTA) 137-50 MCG/ACT SUSP Place 2 sprays into the nose daily. 1 Bottle 3  . calcium-vitamin D (OSCAL WITH D) 250-125 MG-UNIT tablet Take by mouth.    . Cholecalciferol (VITAMIN D-3) 1000 UNITS CAPS Take 1 capsule by mouth daily.    [name] Kitchen estradiol (ESTRACE) 2 MG tablet Take 2 mg by mouth daily.    [name] Kitchen ibuprofen (ADVIL,MOTRIN) 600 MG tablet Take 1 tablet (600 mg total) by mouth every 8 (eight) hours as needed. 30 tablet 0  . omeprazole (PRILOSEC) 20 MG capsule Take 1 capsule (20 mg total) by mouth 2 (two) times daily. 60 capsule 2  . albuterol (PROVENTIL HFA;VENTOLIN HFA) 108 (90 Base) MCG/ACT inhaler Inhale 2 puffs every 6 (six) hours as needed into the lungs. (Patient not taking: Reported on 04/01/2018) 1 Inhaler 1  . benzonatate (TESSALON PERLES) 100 MG capsule Take 100 mg by mouth 3 (three) times daily as needed for cough.    . valsartan (DIOVAN) 320 MG tablet Take 1 tablet (320 mg total) by mouth daily. 90 tablet 1   No current facility-administered medications for this visit.     Allergies: No Known Allergies  Past Surgical History:  Procedure Laterality Date  . ABDOMINAL HYSTERECTOMY    . ANKLE ARTHROSCOPY Left   . BUNIONECTOMY Bilateral   .  KNEE ARTHROSCOPY Right   . Left knee open surgery    . TOTAL ABDOMINAL HYSTERECTOMY    . TRANSTHORACIC ECHOCARDIOGRAM  07/2012; 01/2013   a) 4/'14: mild-mod AS, Mod AI (mean/peak gradient: 18 mmHg / 31 mmhg).  Mild MVP w/ Mod MR, no MS.  Mildly elevated PAP (~37 mmHg) w/ Mod TR. EF 55-60%. GR 1 DD.;; b). 01/2013: EF 60-65%. No RWMA. Gr  DD. Mild AS (M / P Gradient 20 mmHg/34 mmHg). Mild AMVL Prolapse w/ Mod MR. Mod LA dilation. Mod TR.  [name] Kitchen TRANSTHORACIC ECHOCARDIOGRAM  01/2015; 01/2016   a) Normal LV size and function. GR 1 DD. Moderate LA Dilation. Calcified aortic valve with mild AS, moderate (2+) AI. Restricted posterior MV leaflet movement w/ Mod MR. Mode TR with mod elevated PA  pressures (45 mmHg);; b) Relatively stable. Moderate concentric LVH. Moderate AI with mild AS (Mean-PeaK Gradient: 19 / 36 mmHg). Mild MR. Mod LA dilation. PAP ~40 mmHg -> plan follow-up 2020.    Social History   Socioeconomic History  . Marital status: Married    Spouse name: Lisa Miles  . Number of children: 2  . Years of education: Not on file  . Highest education level: Not on file  Occupational History  . Occupation: production    Employer: [employer]    Comment: check printing/shipping  Social Needs  . Financial resource strain: Not on file  . Food insecurity:    Worry: Not on file    Inability: Not on file  . Transportation needs:    Medical: Not on file    Non-medical: Not on file  Tobacco Use  . Smoking status: Never Smoker  . Smokeless tobacco: Never Used  Substance and Sexual Activity  . Alcohol use: No  . Drug use: No  . Sexual activity: Never  Lifestyle  . Physical activity:    Days per week: Not on file    Minutes per session: Not on file  . Stress: Not on file  Relationships  . Social connections:    Talks on phone: Not on file    Gets together: Not on file    Attends religious service: Not on file    Active member of club or organization: Not on file    Attends meetings of clubs or organizations: Not on file    Relationship status: Not on file  Other Topics Concern  . Not on file  Social History Narrative   Lives with her husband.  Their children live nearby.    Family History  Problem Relation Age of Onset  . Arthritis Mother   . Hyperlipidemia Mother   . Diabetes Mother   . Gout Mother   . Hypertension Mother   . Cancer Father        GI cancer  . Diabetes Maternal Grandfather   . Hypertension Brother   . Diabetes Brother   . Hypertension Brother      ROS Review of Systems  Constitutional: Negative for activity change, appetite change, chills and fever.  HENT: Negative for congestion, nosebleeds, trouble swallowing and voice change.    Respiratory: see hpi Gastrointestinal: Negative for diarrhea, nausea and vomiting.  Genitourinary: Negative for difficulty urinating, dysuria, flank pain and hematuria. See hpi Musculoskeletal: Negative for back pain, joint swelling and neck pain.  Neurological: Negative for dizziness, speech difficulty, light-headedness and numbness.  See HPI. All other review of systems negative.   Objective: Vitals:   04/01/18 1534  BP: 131/78  Pulse: 66  Resp: 17  Temp: 98.3 F (36.8 C)  TempSrc: Oral  SpO2: 97%  Weight: 221 lb (100.2 kg)  Height: 5' 4.5" (1.638 m)    Physical Exam Physical Exam  Constitutional: Oriented to [place], place, and time. Appears well-developed and well-nourished.  HENT:  Head: Normocephalic and atraumatic.  Eyes: Conjunctivae and EOM are normal.  Cardiovascular: Normal rate, regular rhythm, normal heart sounds and intact distal pulses.  No murmur heard. Pulmonary/Chest: Effort normal and breath sounds normal. No stridor. No respiratory distress. Has no wheezes.  Neurological: Is alert and oriented to [place], place, and time.  Skin: Skin is warm. Capillary refill takes less than 2 seconds.  Psychiatric: Has a normal mood and affect. Behavior is normal. Judgment and thought content normal.   Assessment and Plan Lisa Miles was seen today for body pain intermittent and back pain is more consistent x  m.  Diagnoses and all orders for this visit:  OAB (overactive bladder)  Need for prophylactic vaccination and inoculation against influenza  Urinary frequency -     POCT urinalysis dipstick -     POCT glycosylated hemoglobin (Hb A1C)  Vision changes -     POCT glycosylated hemoglobin (Hb A1C)  Essential hypertension -     CMP14+EGFR -     valsartan (DIOVAN) 320 MG tablet; Take 1 tablet (320 mg total) by mouth daily.  Chronic throat clearing -     Ambulatory referral to ENT  Cough -     Ambulatory referral to ENT  Visual changes  Lower extremity  edema  Primary osteoarthritis involving multiple joints  Other orders -     Cancel: Flu Vaccine QUAD 36+ mos IM -     Flu vaccine HIGH DOSE PF (Fluzone High dose)   Problem List Items Addressed This Visit      Cardiovascular and Mediastinum   Essential hypertension (Chronic)    Patient was having polyuria with urinary incontinence thus d/c hctz and increased valsartan to 334m She should follow up with Cardiology as instructed.      Relevant Medications   valsartan (DIOVAN) 320 MG tablet   Other Relevant Orders   CMP14+EGFR (Completed)     Musculoskeletal and Integument   Primary osteoarthritis involving multiple joints    Reviewed xrays that have been done over the years including MRI. Shows arthritis in multiple joints. She was advised to use arthritis meds - tylenol or motrin         Genitourinary   OAB (overactive bladder) - Primary    Discussed stopping the HCTZ Will monitor and add ditropan or some other medication if symptoms persist. She will need to monitor her bp off hctz. Do bladder training and follow up in clinic.         Other   Lower extremity edema (Chronic)    Patient with overactive bladder who is having lower extremity edema as well.  Since the diuretic was cause more urinary symptoms it was decided to discontinue despite the benefit to the edema.  Will advise compression stocking and low salt diet      Visual changes    Likely related to underlying cataracts or lens disease Patient to follow up with Ophthalmology       Cough   Relevant Orders   Ambulatory referral to ENT   Chronic throat clearing    Improved with omeprazole in the past but has recurred and is chronic and becoming increasingly severe. Discuss laryngoscopy. Pt will follow up with ENT  Relevant Orders   Ambulatory referral to ENT   Urinary frequency   Relevant Orders   POCT urinalysis dipstick (Completed)   POCT glycosylated hemoglobin (Hb A1C) (Completed)    Other  Visit Diagnoses    Need for prophylactic vaccination and inoculation against influenza       Vision changes       Relevant Orders   POCT glycosylated hemoglobin (Hb A1C) (Completed)       Lisa Miles

## 2018-04-01 ENCOUNTER — Ambulatory Visit (INDEPENDENT_AMBULATORY_CARE_PROVIDER_SITE_OTHER): Payer: PPO | Admitting: Family Medicine

## 2018-04-01 ENCOUNTER — Other Ambulatory Visit: Payer: Self-pay

## 2018-04-01 ENCOUNTER — Encounter: Payer: Self-pay | Admitting: Family Medicine

## 2018-04-01 VITALS — BP 131/78 | HR 66 | Temp 98.3°F | Resp 17 | Ht 64.5 in | Wt 221.0 lb

## 2018-04-01 DIAGNOSIS — H539 Unspecified visual disturbance: Secondary | ICD-10-CM | POA: Diagnosis not present

## 2018-04-01 DIAGNOSIS — M8949 Other hypertrophic osteoarthropathy, multiple sites: Secondary | ICD-10-CM

## 2018-04-01 DIAGNOSIS — Z23 Encounter for immunization: Secondary | ICD-10-CM | POA: Diagnosis not present

## 2018-04-01 DIAGNOSIS — M150 Primary generalized (osteo)arthritis: Secondary | ICD-10-CM | POA: Diagnosis not present

## 2018-04-01 DIAGNOSIS — R350 Frequency of micturition: Secondary | ICD-10-CM | POA: Insufficient documentation

## 2018-04-01 DIAGNOSIS — R600 Localized edema: Secondary | ICD-10-CM

## 2018-04-01 DIAGNOSIS — R05 Cough: Secondary | ICD-10-CM

## 2018-04-01 DIAGNOSIS — R059 Cough, unspecified: Secondary | ICD-10-CM

## 2018-04-01 DIAGNOSIS — R6889 Other general symptoms and signs: Secondary | ICD-10-CM | POA: Diagnosis not present

## 2018-04-01 DIAGNOSIS — R0989 Other specified symptoms and signs involving the circulatory and respiratory systems: Secondary | ICD-10-CM | POA: Insufficient documentation

## 2018-04-01 DIAGNOSIS — I10 Essential (primary) hypertension: Secondary | ICD-10-CM | POA: Diagnosis not present

## 2018-04-01 DIAGNOSIS — M159 Polyosteoarthritis, unspecified: Secondary | ICD-10-CM

## 2018-04-01 DIAGNOSIS — N3281 Overactive bladder: Secondary | ICD-10-CM | POA: Diagnosis not present

## 2018-04-01 LAB — POCT URINALYSIS DIP (MANUAL ENTRY)
Bilirubin, UA: NEGATIVE
Blood, UA: NEGATIVE
Glucose, UA: NEGATIVE mg/dL
Ketones, POC UA: NEGATIVE mg/dL
Leukocytes, UA: NEGATIVE
Nitrite, UA: NEGATIVE
Protein Ur, POC: NEGATIVE mg/dL
Spec Grav, UA: 1.015 (ref 1.010–1.025)
Urobilinogen, UA: 0.2 E.U./dL
pH, UA: 7.5 (ref 5.0–8.0)

## 2018-04-01 LAB — POCT GLYCOSYLATED HEMOGLOBIN (HGB A1C): Hemoglobin A1C: 5.7 % — AB (ref 4.0–5.6)

## 2018-04-01 MED ORDER — VALSARTAN 320 MG PO TABS: 320.0000 mg | ORAL_TABLET | Freq: Every day | ORAL | 1 refills | Status: DC

## 2018-04-01 NOTE — Assessment & Plan Note (Signed)
Reviewed xrays that have been done over the years including MRI. Shows arthritis in multiple joints. She was advised to use arthritis meds - tylenol or motrin

## 2018-04-01 NOTE — Assessment & Plan Note (Signed)
Patient with overactive bladder who is having lower extremity edema as well.  Since the diuretic was cause more urinary symptoms it was decided to discontinue despite the benefit to the edema.  Will advise compression stocking and low salt diet

## 2018-04-01 NOTE — Assessment & Plan Note (Signed)
Patient was having polyuria with urinary incontinence thus d/c hctz and increased valsartan to 320mg  She should follow up with Cardiology as instructed.

## 2018-04-01 NOTE — Patient Instructions (Addendum)
If you have lab work done today you will be contacted with your lab results within the next 2 weeks.  If you have not heard from [place] then please contact us. The fastest way to get your results is to register for My Chart.   IF you received an x-ray today, you will receive an invoice from [facility]. Please contact [facility]iology at [phone] with questions or concerns regarding your invoice.   IF you received labwork today, you will receive an invoice from [place]. Please contact [facility] at [phone] with questions or concerns regarding your invoice.   Our billing staff will not be able to assist you with questions regarding bills from these companies.  You will be contacted with the lab results as soon as they are available. The fastest way to get your results is to activate your My Chart account. Instructions are located on the last page of this paperwork. If you have not heard from [place] regarding the results in 2 weeks, please contact this office.     Overactive Bladder, Adult Overactive bladder is a group of urinary symptoms. With overactive bladder, you may suddenly feel the need to pass urine (urinate) right away. After feeling this sudden urge, you might also leak urine if you cannot get to the bathroom fast enough (urinary incontinence). These symptoms might interfere with your daily work or social activities. Overactive bladder symptoms may also wake you up at night. Overactive bladder affects the nerve signals between your bladder and your brain. Your bladder may get the signal to empty before it is full. Very sensitive muscles can also make your bladder squeeze too soon. What are the causes? Many things can cause an overactive bladder. Possible causes include:  Urinary tract infection.  Infection of nearby tissues, such as the prostate.  Prostate enlargement.  Being pregnant with twins or more (multiples).  Surgery on the uterus or  urethra.  Bladder stones, inflammation, or tumors.  Drinking too much caffeine or alcohol.  Certain medicines, especially those that you take to help your body get rid of extra fluid (diuretics) by increasing urine production.  Muscle or nerve weakness, especially from: ? A spinal cord injury. ? Stroke. ? Multiple sclerosis. ? Parkinson disease.  Diabetes. This can cause a high urine volume that fills the bladder so quickly that the normal urge to urinate is triggered very strongly.  Constipation. A buildup of too much stool can put pressure on your bladder.  What increases the risk? You may be at greater risk for overactive bladder if you:  Are an older adult.  Smoke.  Are going through menopause.  Have prostate problems.  Have a neurological disease, such as stroke, dementia, Parkinson disease, or multiple sclerosis (MS).  Eat or drink things that irritate the bladder. These include alcohol, spicy food, and caffeine.  Are overweight or obese.  What are the signs or symptoms? The signs and symptoms of an overactive bladder include:  Sudden, strong urges to urinate.  Leaking urine.  Urinating eight or more times per day.  Waking up to urinate two or more times per night.  How is this diagnosed? Your health care provider may suspect overactive bladder based on your symptoms. The health care provider will do a physical exam and take your medical history. Blood or urine tests may also be done. For example, you might need to have a bladder function test to check how well you can hold your urine. You might also need  to see a health care provider who specializes in the urinary tract (urologist). How is this treated? Treatment for overactive bladder depends on the cause of your condition and whether it is mild or severe. Certain treatments can be done in your health care provider's office or clinic. You can also make lifestyle changes at home. Options include: Behavioral  Treatments  Biofeedback. A specialist uses sensors to help you become aware of your body's signals.  Keeping a daily log of when you need to urinate and what happens after the urge. This may help you manage your condition.  Bladder training. This helps you learn to control the urge to urinate by following a schedule that directs you to urinate at regular intervals (timed voiding). At first, you might have to wait a few minutes after feeling the urge. In time, you should be able to schedule bathroom visits an hour or more apart.  Kegel exercises. These are exercises to strengthen the pelvic floor muscles, which support the bladder. Toning these muscles can help you control urination, even if your bladder muscles are overactive. A specialist will teach you how to do these exercises correctly. They require daily practice.  Weight loss. If you are obese or overweight, losing weight might relieve your symptoms of overactive bladder. Talk to your health care provider about losing weight and whether there is a specific program or method that would work best for you.  Diet change. This might help if constipation is making your overactive bladder worse. Your health care provider or a dietitian can explain ways to change what you eat to ease constipation. You might also need to consume less alcohol and caffeine or drink other fluids at different times of the day.  Stopping smoking.  Wearing pads to absorb leakage while you wait for other treatments to take effect. Physical Treatments  Electrical stimulation. Electrodes send gentle pulses of electricity to strengthen the nerves or muscles that help to control the bladder. Sometimes, the electrodes are placed outside of the body. In other cases, they might be placed inside the body (implanted). This treatment can take several months to have an effect.  Supportive devices. Women may need a plastic device that fits into the vagina and supports the bladder  (pessary). Medicines Several medicines can help treat overactive bladder and are usually used along with other treatments. Some are injected into the muscles involved in urination. Others come in pill form. Your health care provider may prescribe:  Antispasmodics. These medicines block the signals that the nerves send to the bladder. This keeps the bladder from releasing urine at the wrong time.  Tricyclic antidepressants. These types of antidepressants also relax bladder muscles.  Surgery  You may have a device implanted to help manage the nerve signals that indicate when you need to urinate.  You may have surgery to implant electrodes for electrical stimulation.  Sometimes, very severe cases of overactive bladder require surgery to change the shape of the bladder. Follow these instructions at home:  Take medicines only as directed by your health care provider.  Use any implants or a pessary as directed by your health care provider.  Make any diet or lifestyle changes that are recommended by your health care provider. These might include: ? Drinking less fluid or drinking at different times of the day. If you need to urinate often during the night, you may need to stop drinking fluids early in the evening. ? Cutting down on caffeine or alcohol. Both can make an  overactive bladder worse. Caffeine is found in coffee, tea, and sodas. ? Doing Kegel exercises to strengthen muscles. ? Losing weight if you need to. ? Eating a healthy and balanced diet to prevent constipation.  Keep a journal or log to track how much and when you drink and also when you feel the need to urinate. This will help your health care provider to monitor your condition. Contact a health care provider if:  Your symptoms do not get better after treatment.  Your pain and discomfort are getting worse.  You have more frequent urges to urinate.  You have a fever. Get help right away if: You are not able to control  your bladder at all. This information is not intended to replace advice given to you by your health care provider. Make sure you discuss any questions you have with your health care provider. Document Released: 02/07/2009 Document Revised: 09/19/2015 Document Reviewed: 09/06/2013 Elsevier Interactive Patient Education  [employer].

## 2018-04-01 NOTE — Assessment & Plan Note (Signed)
Discussed stopping the HCTZ Will monitor and add ditropan or some other medication if symptoms persist. She will need to monitor her bp off hctz. Do bladder training and follow up in clinic.

## 2018-04-01 NOTE — Assessment & Plan Note (Signed)
Improved with omeprazole in the past but has recurred and is chronic and becoming increasingly severe. Discuss laryngoscopy. Pt will follow up with ENT

## 2018-04-01 NOTE — Assessment & Plan Note (Signed)
Likely related to underlying cataracts or lens disease Patient to follow up with Ophthalmology

## 2018-04-02 LAB — CMP14+EGFR
ALT: 14 IU/L (ref 0–32)
AST: 21 IU/L (ref 0–40)
Albumin/Globulin Ratio: 1.4 (ref 1.2–2.2)
Albumin: 3.7 g/dL (ref 3.6–4.8)
Alkaline Phosphatase: 45 IU/L (ref 39–117)
BUN/Creatinine Ratio: 17 (ref 12–28)
BUN: 13 mg/dL (ref 8–27)
Bilirubin Total: <0.2 mg/dL (ref 0.0–1.2)
CO2: 24 mmol/L (ref 20–29)
Calcium: 9.2 mg/dL (ref 8.7–10.3)
Chloride: 101 mmol/L (ref 96–106)
Creatinine, Ser: 0.75 mg/dL (ref 0.57–1.00)
GFR calc Af Amer: 95 mL/min/{1.73_m2} (ref >59)
GFR calc non Af Amer: 83 mL/min/{1.73_m2} (ref >59)
Globulin, Total: 2.7 g/dL (ref 1.5–4.5)
Glucose: 78 mg/dL (ref 65–99)
Potassium: 4 mmol/L (ref 3.5–5.2)
Sodium: 138 mmol/L (ref 134–144)
Total Protein: 6.4 g/dL (ref 6.0–8.5)

## 2018-04-04 ENCOUNTER — Ambulatory Visit: Payer: Self-pay | Admitting: *Deleted

## 2018-04-04 NOTE — Telephone Encounter (Signed)
Pt called with requesting a medication for a cough that she has had for a while. She saw her pcp in the office on Friday and she stated that a med was offered but she declined. It is not noted in the chart what medication she is referring to.  She denies fever. Cough is productive at times, white thin phlegm. She feels like the cough has gotten worst since Friday. Pt did not cough while talking on the phone with this RN.  She would like for Dr. Nolon Rod go call her  In a medication and or give her a call back please. Routing to flow at PCP. Advised to call back for increase in symptoms, pt voiced understanding.  Answer Assessment - Initial Assessment Questions 1. ONSET: "When did the cough begin?"      For a while 2. SEVERITY: "How bad is the cough today?"      Getting worst 3. RESPIRATORY DISTRESS: "Describe your breathing."      normal 4. FEVER: "Do you have a fever?" If so, ask: "What is your temperature, how was it measured, and when did it start?"     no 5. SPUTUM: "Describe the color of your sputum" (clear, white, yellow, green)     White and thin 6. HEMOPTYSIS: "Are you coughing up any blood?" If so ask: "How much?" (flecks, streaks, tablespoons, etc.)     no 7. CARDIAC HISTORY: "Do you have any history of heart disease?" (e.g., heart attack, congestive heart failure)      Heart murmur 8. LUNG HISTORY: "Do you have any history of lung disease?"  (e.g., pulmonary embolus, asthma, emphysema)     no 9. PE RISK FACTORS: "Do you have a history of blood clots?" (or: recent major surgery, recent prolonged travel, bedridden)     no 10. OTHER SYMPTOMS: "Do you have any other symptoms?" (e.g., runny nose, wheezing, chest pain)       no 11. PREGNANCY: "Is there any chance you are pregnant?" "When was your last menstrual period?"       no 12. TRAVEL: "Have you traveled out of the country in the last month?" (e.g., travel history, exposures)       no  Protocols used: [place]

## 2018-04-06 ENCOUNTER — Ambulatory Visit: Payer: PPO | Admitting: Emergency Medicine

## 2018-04-06 NOTE — Telephone Encounter (Signed)
Please see note below. 

## 2018-04-12 ENCOUNTER — Telehealth: Payer: Self-pay | Admitting: Family Medicine

## 2018-04-12 NOTE — Telephone Encounter (Signed)
Copied from [place] [id number]. Topic: General - Other >> Apr 12, 2018  4:11 PM Mcneil, Ja-Kwan wrote: Reason for CRM: Pt called in stating she is still in need of medication that was offered. Pt states she has not heard back from anyone and the pharmacy has not received a prescription as of yet. Pt requests call back. Cb# [phone]

## 2018-04-14 NOTE — Telephone Encounter (Signed)
Please advise 

## 2018-04-17 MED ORDER — BENZONATATE 100 MG PO CAPS: 100.0000 mg | ORAL_CAPSULE | Freq: Three times a day (TID) | ORAL | 3 refills | Status: DC | PRN

## 2018-04-17 NOTE — Telephone Encounter (Signed)
I have sent in [place] for her cough.  I would like her to continue her omeprazole and follow up with ENT for evaluation for this cough.

## 2018-04-18 ENCOUNTER — Telehealth: Payer: Self-pay | Admitting: *Deleted

## 2018-04-18 DIAGNOSIS — H25049 Posterior subcapsular polar age-related cataract, unspecified eye: Secondary | ICD-10-CM | POA: Diagnosis not present

## 2018-04-18 NOTE — Telephone Encounter (Signed)
Spoke with patient to advise her that Lisa Miles have been sent to the pharmacy for her cough and note for taking Omeprazole per Dr. Kaleen Mask note. Per pt she stated that "she haven't taken this in months, once the bottle was empty." I advised her that Dr. Marshell Garfinkel had given her 2 refills. Pt stated that "she will check with pharmacy."

## 2018-05-12 DIAGNOSIS — H25812 Combined forms of age-related cataract, left eye: Secondary | ICD-10-CM | POA: Diagnosis not present

## 2018-05-12 DIAGNOSIS — Z01818 Encounter for other preprocedural examination: Secondary | ICD-10-CM | POA: Diagnosis not present

## 2018-05-17 DIAGNOSIS — R0982 Postnasal drip: Secondary | ICD-10-CM | POA: Diagnosis not present

## 2018-05-17 DIAGNOSIS — K219 Gastro-esophageal reflux disease without esophagitis: Secondary | ICD-10-CM | POA: Diagnosis not present

## 2018-05-23 DIAGNOSIS — H2512 Age-related nuclear cataract, left eye: Secondary | ICD-10-CM | POA: Diagnosis not present

## 2018-05-23 DIAGNOSIS — H25812 Combined forms of age-related cataract, left eye: Secondary | ICD-10-CM | POA: Diagnosis not present

## 2018-07-06 ENCOUNTER — Other Ambulatory Visit: Payer: Self-pay

## 2018-07-06 ENCOUNTER — Encounter: Payer: Self-pay | Admitting: Family Medicine

## 2018-07-06 ENCOUNTER — Ambulatory Visit (INDEPENDENT_AMBULATORY_CARE_PROVIDER_SITE_OTHER): Payer: PPO | Admitting: Family Medicine

## 2018-07-06 VITALS — BP 127/79 | HR 69 | Temp 98.5°F | Resp 16 | Ht 64.5 in | Wt 229.6 lb

## 2018-07-06 DIAGNOSIS — R6889 Other general symptoms and signs: Secondary | ICD-10-CM | POA: Diagnosis not present

## 2018-07-06 DIAGNOSIS — H539 Unspecified visual disturbance: Secondary | ICD-10-CM | POA: Diagnosis not present

## 2018-07-06 DIAGNOSIS — R7303 Prediabetes: Secondary | ICD-10-CM | POA: Diagnosis not present

## 2018-07-06 DIAGNOSIS — R0989 Other specified symptoms and signs involving the circulatory and respiratory systems: Secondary | ICD-10-CM

## 2018-07-06 DIAGNOSIS — I10 Essential (primary) hypertension: Secondary | ICD-10-CM

## 2018-07-06 DIAGNOSIS — N3281 Overactive bladder: Secondary | ICD-10-CM | POA: Diagnosis not present

## 2018-07-06 LAB — CMP14+EGFR
ALT: 11 IU/L (ref 0–32)
AST: 16 IU/L (ref 0–40)
Albumin/Globulin Ratio: 1.5 (ref 1.2–2.2)
Albumin: 3.8 g/dL (ref 3.8–4.8)
Alkaline Phosphatase: 46 IU/L (ref 39–117)
BUN/Creatinine Ratio: 18 (ref 12–28)
BUN: 14 mg/dL (ref 8–27)
Bilirubin Total: <0.2 mg/dL (ref 0.0–1.2)
CO2: 22 mmol/L (ref 20–29)
Calcium: 9.2 mg/dL (ref 8.7–10.3)
Chloride: 101 mmol/L (ref 96–106)
Creatinine, Ser: 0.8 mg/dL (ref 0.57–1.00)
GFR calc Af Amer: 88 mL/min/{1.73_m2} (ref >59)
GFR calc non Af Amer: 77 mL/min/{1.73_m2} (ref >59)
Globulin, Total: 2.6 g/dL (ref 1.5–4.5)
Glucose: 86 mg/dL (ref 65–99)
Potassium: 4.7 mmol/L (ref 3.5–5.2)
Sodium: 136 mmol/L (ref 134–144)
Total Protein: 6.4 g/dL (ref 6.0–8.5)

## 2018-07-06 LAB — POCT URINALYSIS DIP (MANUAL ENTRY)
Bilirubin, UA: NEGATIVE
Glucose, UA: NEGATIVE mg/dL
Ketones, POC UA: NEGATIVE mg/dL
Leukocytes, UA: NEGATIVE
Nitrite, UA: NEGATIVE
Spec Grav, UA: 1.02 (ref 1.010–1.025)
Urobilinogen, UA: 0.2 E.U./dL
pH, UA: 7 (ref 5.0–8.0)

## 2018-07-06 LAB — LIPID PANEL
Chol/HDL Ratio: 2.6 ratio (ref 0.0–4.4)
Cholesterol, Total: 180 mg/dL (ref 100–199)
HDL: 68 mg/dL (ref >39)
LDL Calculated: 91 mg/dL (ref 0–99)
Triglycerides: 104 mg/dL (ref 0–149)
VLDL Cholesterol Cal: 21 mg/dL (ref 5–40)

## 2018-07-06 LAB — HEMOGLOBIN A1C
Est. average glucose Bld gHb Est-mCnc: 108 mg/dL
Hgb A1c MFr Bld: 5.4 % (ref 4.8–5.6)

## 2018-07-06 LAB — GLUCOSE, POCT (MANUAL RESULT ENTRY): POC Glucose: 91 mg/dl (ref 70–99)

## 2018-07-06 MED ORDER — AMOXICILLIN 500 MG PO TABS: 2000.0000 mg | ORAL_TABLET | Freq: Once | ORAL | 0 refills | Status: DC | PRN

## 2018-07-06 NOTE — Patient Instructions (Addendum)
If you have lab work done today you will be contacted with your lab results within the next 2 weeks.  If you have not heard from [place] then please contact us. The fastest way to get your results is to register for My Chart.   IF you received an x-ray today, you will receive an invoice from [facility]. Please contact [facility]iology at [phone] with questions or concerns regarding your invoice.   IF you received labwork today, you will receive an invoice from [place]. Please contact [facility] at [phone] with questions or concerns regarding your invoice.   Our billing staff will not be able to assist you with questions regarding bills from these companies.  You will be contacted with the lab results as soon as they are available. The fastest way to get your results is to activate your My Chart account. Instructions are located on the last page of this paperwork. If you have not heard from [place] regarding the results in 2 weeks, please contact this office.     Preventing Type 2 Diabetes Mellitus Type 2 diabetes (type 2 diabetes mellitus) is a long-term (chronic) disease that affects blood sugar (glucose) levels. Normally, a hormone called insulin allows glucose to enter cells in the body. The cells use glucose for energy. In type 2 diabetes, one or both of these problems may be present:  The body does not make enough insulin.  The body does not respond properly to insulin that it makes (insulin resistance). Insulin resistance or lack of insulin causes excess glucose to build up in the blood instead of going into cells. As a result, high blood glucose (hyperglycemia) develops, which can cause many complications. Being overweight or obese and having an inactive (sedentary) lifestyle can increase your risk for diabetes. Type 2 diabetes can be delayed or prevented by making certain nutrition and lifestyle changes. What nutrition changes can be made?   Eat healthy meals and  snacks regularly. Keep a healthy snack with you for when you get hungry between meals, such as fruit or a handful of nuts.  Eat lean meats and proteins that are low in saturated fats, such as chicken, fish, egg whites, and beans. Avoid processed meats.  Eat plenty of fruits and vegetables and plenty of grains that have not been processed (whole grains). It is recommended that you eat: ? 1?2 cups of fruit every day. ? 2?3 cups of vegetables every day. ? 6?8 oz of whole grains every day, such as oats, whole wheat, bulgur, brown rice, quinoa, and millet.  Eat low-fat dairy products, such as milk, yogurt, and cheese.  Eat foods that contain healthy fats, such as nuts, avocado, olive oil, and canola oil.  Drink water throughout the day. Avoid drinks that contain added sugar, such as soda or sweet tea.  Follow instructions from your health care provider about specific eating or drinking restrictions.  Control how much food you eat at a time (portion size). ? Check food labels to find out the serving sizes of foods. ? Use a kitchen scale to weigh amounts of foods.  Saute or steam food instead of frying it. Cook with water or broth instead of oils or butter.  Limit your intake of: ? Salt (sodium). Have no more than 1 tsp (2,400 mg) of sodium a day. If you have heart disease or high blood pressure, have less than ? tsp (1,500 mg) of sodium a day. ? Saturated fat. This is fat that is solid  at room temperature, such as butter or fat on meat. What lifestyle changes can be made? Activity   Do moderate-intensity physical activity for at least 30 minutes on at least 5 days of the week, or as much as told by your health care provider.  Ask your health care provider what activities are safe for you. A mix of physical activities may be best, such as walking, swimming, cycling, and strength training.  Try to add physical activity into your day. For example: ? Park in spots that are farther away  than usual, so that you walk more. For example, park in a far corner of the parking lot when you go to the office or the grocery store. ? Take a walk during your lunch break. ? Use stairs instead of elevators or escalators. Weight Loss  Lose weight as directed. Your health care provider can determine how much weight loss is best for you and can help you lose weight safely.  If you are overweight or obese, you may be instructed to lose at least 5?7 % of your body weight. Alcohol and Tobacco   Limit alcohol intake to no more than 1 drink a day for nonpregnant women and 2 drinks a day for men. One drink equals 12 oz of beer, 5 oz of wine, or 1 oz of hard liquor.  Do not use any tobacco products, such as cigarettes, chewing tobacco, and e-cigarettes. If you need help quitting, ask your health care provider. Work With [place] Provider  Have your blood glucose tested regularly, as told by your health care provider.  Discuss your risk factors and how you can reduce your risk for diabetes.  Get screening tests as told by your health care provider. You may have screening tests regularly, especially if you have certain risk factors for type 2 diabetes.  Make an appointment with a diet and nutrition specialist (registered dietitian). A registered dietitian can help you make a healthy eating plan and can help you understand portion sizes and food labels. Why are these changes important?  It is possible to prevent or delay type 2 diabetes and related health problems by making lifestyle and nutrition changes.  It can be difficult to recognize signs of type 2 diabetes. The best way to avoid possible damage to your body is to take actions to prevent the disease before you develop symptoms. What can happen if changes are not made?  Your blood glucose levels may keep increasing. Having high blood glucose for a long time is dangerous. Too much glucose in your blood can damage your blood vessels,  heart, kidneys, nerves, and eyes.  You may develop prediabetes or type 2 diabetes. Type 2 diabetes can lead to many chronic health problems and complications, such as: ? Heart disease. ? Stroke. ? Blindness. ? Kidney disease. ? Depression. ? Poor circulation in the feet and legs, which could lead to surgical removal (amputation) in severe cases. Where to find support  Ask your health care provider to recommend a registered dietitian, diabetes educator, or weight loss program.  Look for local or online weight loss groups.  Join a gym, fitness club, or outdoor activity group, such as a walking club. Where to find more information To learn more about diabetes and diabetes prevention, visit:  American Diabetes Association (ADA): www.diabetes.[employer] of Diabetes and Digestive and Kidney Diseases: FindSpin.nl To learn more about healthy eating, visit:  The U.S. Department of Agriculture Scientist, research (physical sciences)), Choose My Plate: http://wiley-williams.com/  Office of Disease Prevention and Health Promotion (ODPHP), Dietary Guidelines: SurferLive.at Summary  You can reduce your risk for type 2 diabetes by increasing your physical activity, eating healthy foods, and losing weight as directed.  Talk with your health care provider about your risk for type 2 diabetes. Ask about any blood tests or screening tests that you need to have. This information is not intended to replace advice given to you by your health care provider. Make sure you discuss any questions you have with your health care provider. Document Released: 08/05/2015 Document Revised: 03/25/2017 Document Reviewed: 06/04/2015 Elsevier Interactive Patient Education  2019 [employer].

## 2018-07-06 NOTE — Progress Notes (Signed)
Established Patient Office Visit  Subjective:  Patient ID: Lisa Miles, female    DOB: [birth year]  Age: 68 y.o. MRN: [id number]  CC:  Chief Complaint  Patient presents with  . overactive bladder    3 month f/u    HPI Jaylon A Morgan presents for   Vision changes Overactive bladder Pt reports that she always has a feeling like something is in her eyes She had cataract surgery 3 weeks ago She will be getting new lenses  She has overactive bladder She was not started on the medication for the bladder  Hypertension: Patient here for follow-up of elevated blood pressure. She is exercising intermittently and is adherent to low salt diet.  Blood pressure is well controlled at home. Cardiac symptoms none. Patient denies chest pain, claudication, dyspnea, exertional chest pressure/discomfort and irregular heart beat.  Cardiovascular risk factors: dyslipidemia, hypertension and female gender. Use of agents associated with hypertension: none. History of target organ damage: none. BP Readings from Last 3 Encounters:  07/06/18 127/79  04/01/18 131/78  10/21/17 120/64    Prediabetes Lab Results  Component Value Date   HGBA1C 5.7 (A) 04/01/2018   Pt reports that she is working on weight loss [facility] She denies chest pains with exercise Wt Readings from Last 3 Encounters:  07/06/18 229 lb 9.6 oz (104.1 kg)  04/01/18 221 lb (100.2 kg)  10/21/17 224 lb 9.6 oz (101.9 kg)      Past Medical History:  Diagnosis Date  . Aortic stenosis, mild 07/2012   Mild AS with Mod MR -(Mean/Peak Gradient 20 mmHg/34 mmHg).  . Asthma   . Bilateral bunions    Bunionectomies performed  . Bronchitis, chronic ([place])   . Heart disease   . Hyperlipidemia   . Hypertension    Good control  . Inguinodynia    Bilateral groin pain  . Lower extremity edema    Chronic. Venous Duplex 11/06/11 SUMMARY: 1) Bilateral Lower Extremities: No evidence of DVT or thrombophlebitis.  2) Right  Common Femoral Vein: Demonstrated mild valvular insufficiency with a greater than (1) sec of duration. Mildly abnormal LE Venous duplex Doppler.  . Mitral regurgitation due to cusp prolapse 07/2012   Mild AMVL prolapse, Mod MR  . Obesity   . Palpitations    Relatively well controlled  . Scoliosis    DG Chest 2 View x-ray on 07/30/11 by Dr. Everlene Farrier shows a scoliosis.    Past Surgical History:  Procedure Laterality Date  . ABDOMINAL HYSTERECTOMY    . ANKLE ARTHROSCOPY Left   . BUNIONECTOMY Bilateral   . KNEE ARTHROSCOPY Right   . Left knee open surgery    . TOTAL ABDOMINAL HYSTERECTOMY    . TRANSTHORACIC ECHOCARDIOGRAM  07/2012; 01/2013   a) 4/'14: mild-mod AS, Mod AI (mean/peak gradient: 18 mmHg / 31 mmhg).  Mild MVP w/ Mod MR, no MS.  Mildly elevated PAP (~37 mmHg) w/ Mod TR. EF 55-60%. GR 1 DD.;; b). 01/2013: EF 60-65%. No RWMA. Gr  DD. Mild AS (M / P Gradient 20 mmHg/34 mmHg). Mild AMVL Prolapse w/ Mod MR. Mod LA dilation. Mod TR.  [name] Kitchen TRANSTHORACIC ECHOCARDIOGRAM  01/2015; 01/2016   a) Normal LV size and function. GR 1 DD. Moderate LA Dilation. Calcified aortic valve with mild AS, moderate (2+) AI. Restricted posterior MV leaflet movement w/ Mod MR. Mode TR with mod elevated PA pressures (45 mmHg);; b) Relatively stable. Moderate concentric LVH. Moderate AI with mild  AS (Mean-PeaK Gradient: 19 / 36 mmHg). Mild MR. Mod LA dilation. PAP ~40 mmHg -> plan follow-up 2020.    Family History  Problem Relation Age of Onset  . Arthritis Mother   . Hyperlipidemia Mother   . Diabetes Mother   . Gout Mother   . Hypertension Mother   . Cancer Father        GI cancer  . Diabetes Maternal Grandfather   . Hypertension Brother   . Diabetes Brother   . Hypertension Brother     Social History   Socioeconomic History  . Marital status: Married    Spouse name: Herbie [place]  . Number of children: 2  . Years of education: Not on file  . Highest education level: Not on file  Occupational History  .  Occupation: production    Employer: [employer]    Comment: check printing/shipping  Social Needs  . Financial resource strain: Not on file  . Food insecurity:    Worry: Not on file    Inability: Not on file  . Transportation needs:    Medical: Not on file    Non-medical: Not on file  Tobacco Use  . Smoking status: Never Smoker  . Smokeless tobacco: Never Used  Substance and Sexual Activity  . Alcohol use: No  . Drug use: No  . Sexual activity: Never  Lifestyle  . Physical activity:    Days per week: Not on file    Minutes per session: Not on file  . Stress: Not on file  Relationships  . Social connections:    Talks on phone: Not on file    Gets together: Not on file    Attends religious service: Not on file    Active member of club or organization: Not on file    Attends meetings of clubs or organizations: Not on file    Relationship status: Not on file  . Intimate partner violence:    Fear of current or ex partner: Not on file    Emotionally abused: Not on file    Physically abused: Not on file    Forced sexual activity: Not on file  Other Topics Concern  . Not on file  Social History Narrative   Lives with her husband.  Their children live nearby.    Outpatient Medications Prior to Visit  Medication Sig Dispense Refill  . albuterol (PROVENTIL HFA;VENTOLIN HFA) 108 (90 Base) MCG/ACT inhaler Inhale 2 puffs every 6 (six) hours as needed into the lungs. 1 Inhaler 1  . aspirin 81 MG tablet Take by mouth.    . calcium-vitamin D (OSCAL WITH D) 250-125 MG-UNIT tablet Take by mouth.    . Cholecalciferol (VITAMIN D-3) 1000 UNITS CAPS Take 1 capsule by mouth daily.    [name] Kitchen estradiol (ESTRACE) 2 MG tablet Take 2 mg by mouth daily.    . famotidine (PEPCID) 20 MG tablet Take 20 mg by mouth 2 (two) times daily.    [name] Kitchen ibuprofen (ADVIL,MOTRIN) 600 MG tablet Take 1 tablet (600 mg total) by mouth every 8 (eight) hours as needed. 30 tablet 0  . valsartan (DIOVAN) 320 MG tablet Take 1  tablet (320 mg total) by mouth daily. 90 tablet 1  . Azelastine-Fluticasone (DYMISTA) 137-50 MCG/ACT SUSP Place 2 sprays into the nose daily. (Patient not taking: Reported on 07/06/2018) 1 Bottle 3  . benzonatate (TESSALON PERLES) 100 MG capsule Take 1 capsule (100 mg total) by mouth 3 (three) times daily as needed for cough. (Patient not  taking: Reported on 07/06/2018) 20 capsule 3  . omeprazole (PRILOSEC) 20 MG capsule Take 1 capsule (20 mg total) by mouth 2 (two) times daily. (Patient not taking: Reported on 07/06/2018) 60 capsule 2   No facility-administered medications prior to visit.     No Known Allergies  ROS Review of Systems Review of Systems  Constitutional: Negative for activity change, appetite change, chills and fever.  HENT: Negative for congestion, nosebleeds, trouble swallowing and voice change.   Respiratory: Negative for cough, shortness of breath and wheezing.   Gastrointestinal: Negative for diarrhea, nausea and vomiting.  Genitourinary: Negative for difficulty urinating, dysuria, flank pain and hematuria.  Musculoskeletal: Negative for back pain, joint swelling and neck pain.  Neurological: Negative for dizziness, speech difficulty, light-headedness and numbness.  See HPI. All other review of systems negative.     Objective:    Physical Exam  BP 127/79 (BP Location: Right Arm, Patient Position: Sitting, Cuff Size: Large)   Pulse 69   Temp 98.5 F (36.9 C) (Oral)   Resp 16   Ht 5' 4.5" (1.638 m)   Wt 229 lb 9.6 oz (104.1 kg)   SpO2 95%   BMI 38.80 kg/m  Wt Readings from Last 3 Encounters:  07/06/18 229 lb 9.6 oz (104.1 kg)  04/01/18 221 lb (100.2 kg)  10/21/17 224 lb 9.6 oz (101.9 kg)   Physical Exam  Constitutional: Oriented to [place], place, and time. Appears well-developed and well-nourished.  HENT:  Head: Normocephalic and atraumatic.  Eyes: Conjunctivae and EOM are normal.  Cardiovascular: Normal rate, regular rhythm, normal heart sounds and  intact distal pulses.  No murmur heard. Pulmonary/Chest: Effort normal and breath sounds normal. No stridor. No respiratory distress. Has no wheezes.  Neurological: Is alert and oriented to [place], place, and time.  Skin: Skin is warm. Capillary refill takes less than 2 seconds.  Psychiatric: Has a normal mood and affect. Behavior is normal. Judgment and thought content normal.    Health Maintenance Due  Topic Date Due  . PNA vac Low Risk Adult (2 of 2 - PPSV23) 07/22/2017    There are no preventive care reminders to display for this patient.  Lab Results  Component Value Date   TSH 3.160 07/22/2016   Lab Results  Component Value Date   WBC 4.4 06/09/2017   HGB 13.1 06/09/2017   HCT 38.5 06/09/2017   MCV 87.9 06/09/2017   PLT 289.0 06/09/2017   Lab Results  Component Value Date   NA 138 04/01/2018   K 4.0 04/01/2018   CO2 24 04/01/2018   GLUCOSE 78 04/01/2018   BUN 13 04/01/2018   CREATININE 0.75 04/01/2018   BILITOT <0.2 04/01/2018   ALKPHOS 45 04/01/2018   AST 21 04/01/2018   ALT 14 04/01/2018   PROT 6.4 04/01/2018   ALBUMIN 3.7 04/01/2018   CALCIUM 9.2 04/01/2018   ANIONGAP 14 03/29/2014   GFR 98.74 05/23/2014   Lab Results  Component Value Date   CHOL 201 (H) 07/22/2016   Lab Results  Component Value Date   HDL 63 07/22/2016   Lab Results  Component Value Date   LDLCALC 112 (H) 07/22/2016   Lab Results  Component Value Date   TRIG 129 07/22/2016   Lab Results  Component Value Date   CHOLHDL 3.2 07/22/2016   Lab Results  Component Value Date   HGBA1C 5.7 (A) 04/01/2018      Assessment & Plan:   Problem List Items Addressed This Visit  Cardiovascular and Mediastinum   Essential hypertension (Chronic)   Relevant Orders   CMP14+EGFR   Lipid panel     Other   Chronic throat clearing    Other Visit Diagnoses    Overactive bladder    -  Primary   Relevant Orders   POCT urinalysis dipstick (Completed)   Vision changes        Prediabetes       Relevant Orders   POCT glucose (manual entry) (Completed)   Hemoglobin A1c   Need for vaccination        Vision changes Follows with Ophthalmology Discussed vision and pt is scheduled for new lenses s/p cataract surgery   Overactive bladder- stable Will not recommend anticholinergic meds due to risk of vision changes which patient has been dealing with from the Ophthalmologist  Prediabetes -  Discussed exercise for 10 minutes daily -   Discussed diabetes prevention  Hypertension  Patient's blood pressure is at goal of 139/89 or less. Condition is stable. Continue current medications and treatment plan. I recommend that you exercise for 30-45 minutes 5 days a week. I also recommend a balanced diet with fruits and vegetables every day, lean meats, and little fried foods. The DASH diet (you can find this online) is a good example of this.   No orders of the defined types were placed in this encounter.   Follow-up: Return in about 6 months (around 01/06/2019).    Forrest Moron, MD

## 2018-07-20 DIAGNOSIS — K219 Gastro-esophageal reflux disease without esophagitis: Secondary | ICD-10-CM | POA: Diagnosis not present

## 2018-07-20 DIAGNOSIS — J310 Chronic rhinitis: Secondary | ICD-10-CM | POA: Diagnosis not present

## 2018-07-20 DIAGNOSIS — R1312 Dysphagia, oropharyngeal phase: Secondary | ICD-10-CM | POA: Diagnosis not present

## 2018-07-20 DIAGNOSIS — R0982 Postnasal drip: Secondary | ICD-10-CM | POA: Diagnosis not present

## 2018-07-20 DIAGNOSIS — J343 Hypertrophy of nasal turbinates: Secondary | ICD-10-CM | POA: Diagnosis not present

## 2018-08-17 DIAGNOSIS — Z6838 Body mass index (BMI) 38.0-38.9, adult: Secondary | ICD-10-CM | POA: Diagnosis not present

## 2018-08-17 DIAGNOSIS — Z124 Encounter for screening for malignant neoplasm of cervix: Secondary | ICD-10-CM | POA: Diagnosis not present

## 2018-08-17 DIAGNOSIS — R350 Frequency of micturition: Secondary | ICD-10-CM | POA: Diagnosis not present

## 2018-08-24 ENCOUNTER — Telehealth: Payer: Self-pay

## 2018-08-24 NOTE — Telephone Encounter (Signed)
Virtual Visit Pre-Appointment Phone Call  "(Lisa Miles), I am calling you today to discuss your upcoming appointment. We are currently trying to limit exposure to the virus that causes COVID-19 by seeing patients at home rather than in the office."  "What is the BEST phone number to call the day of the visit?" - [phone]  1. "Do you have or have access to (through a family member/friend) a smartphone with video capability that we can use for your visit?" a. If yes - list this number in appt notes as "cell" (if different from BEST phone #) and list the appointment type as a VIDEO visit in appointment notes  2. Confirm consent - "In the setting of the current Covid19 crisis, you are scheduled for a (video) visit with your provider on (May 13) at (11:20am ).  Just as we do with many in-office visits, in order for you to participate in this visit, we must obtain consent.  If you'd like, I can send this to your mychart (if signed up) or email for you to review.  Otherwise, I can obtain your verbal consent now.  All virtual visits are billed to your insurance company just like a normal visit would be.  By agreeing to a virtual visit, we'd like you to understand that the technology does not allow for your provider to perform an examination, and thus may limit your provider's ability to fully assess your condition. If your provider identifies any concerns that need to be evaluated in [place], we will make arrangements to do so.  Finally, though the technology is pretty good, we cannot assure that it will always work on either your or our end, and in the setting of a video visit, we may have to convert it to a phone-only visit.  In either situation, we cannot ensure that we have a secure connection.  Are you willing to proceed?" STAFF: Did the patient verbally acknowledge consent to telehealth visit? Document YES/NO here: YES  3. Advise patient to be prepared - "Two hours prior to your appointment, go  ahead and check your blood pressure, pulse, oxygen saturation, and your weight (if you have the equipment to check those) and write them all down. When your visit starts, your provider will ask you for this information. If you have an Apple Watch or Kardia device, please plan to have heart rate information ready on the day of your appointment. Please have a pen and paper handy nearby the day of the visit as well."  4. Give patient instructions for MyChart download to smartphone OR [email or web address] as below if video visit (depending on what platform provider is using)  5. Inform patient they will receive a phone call 15 minutes prior to their appointment time (may be from unknown caller ID) so they should be prepared to answer    [place] has been deemed a candidate for a follow-up tele-health visit to limit community exposure during the Covid-19 pandemic. I spoke with the patient via phone to ensure availability of phone/video source, confirm preferred email & phone number, and discuss instructions and expectations.  I reminded Lisa Miles Reason to be prepared with any vital sign and/or heart rhythm information that could potentially be obtained via home monitoring, at the time of her visit. I reminded Lisa Miles to expect a phone call prior to her visit.  [name], [place] 08/24/2018 4:27 PM   INSTRUCTIONS FOR DOWNLOADING THE MYCHART APP TO  SMARTPHONE  - The patient must first make sure to have activated MyChart and know their login information - If Apple, go to [employer] and type in MyChart in the search bar and download the app. If Android, ask patient to go to [employer] and type in [place] in the search bar and download the app. The app is free but as with any other app downloads, their phone may require them to verify saved payment information or Apple/Android password.  - The patient will need to then log into the app with their MyChart username and  password, and select [facility] as their healthcare provider to link the account. When it is time for your visit, go to the MyChart app, find appointments, and click Begin Video Visit. Be sure to Select Allow for your device to access the Microphone and Camera for your visit. You will then be connected, and your provider will be with you shortly.  **If they have any issues connecting, or need assistance please contact MyChart service desk [phone])**  **If using a computer, in order to ensure the best quality for their visit they will need to use either of the following Internet Browsers: [employer], or Google Chrome**  IF USING DOXIMITY or DOXY.ME - The patient will receive a link just prior to their visit by text.     FULL LENGTH CONSENT FOR TELE-HEALTH VISIT   I hereby voluntarily request, consent and authorize [facility] and its employed or contracted physicians, physician assistants, nurse practitioners or other licensed health care professionals (the Practitioner), to provide me with telemedicine health care services (the "Services") as deemed necessary by the treating Practitioner. I acknowledge and consent to receive the Services by the Practitioner via telemedicine. I understand that the telemedicine visit will involve communicating with the Practitioner through live audiovisual communication technology and the disclosure of certain medical information by electronic transmission. I acknowledge that I have been given the opportunity to request an in-[place] assessment or other available alternative prior to the telemedicine visit and am voluntarily participating in the telemedicine visit.  I understand that I have the right to withhold or withdraw my consent to the use of telemedicine in the course of my care at any time, without affecting my right to future care or treatment, and that the Practitioner or I may terminate the telemedicine visit at any time. I  understand that I have the right to inspect all information obtained and/or recorded in the course of the telemedicine visit and may receive copies of available information for a reasonable fee.  I understand that some of the potential risks of receiving the Services via telemedicine include:  [name] Kitchen Delay or interruption in medical evaluation due to technological equipment failure or disruption; . Information transmitted may not be sufficient (e.g. poor resolution of images) to allow for appropriate medical decision making by the Practitioner; and/or  . In rare instances, security protocols could fail, causing a breach of personal health information.  Furthermore, I acknowledge that it is my responsibility to provide information about my medical history, conditions and care that is complete and accurate to the best of my ability. I acknowledge that Practitioner's advice, recommendations, and/or decision may be based on factors not within their control, such as incomplete or inaccurate data provided by me or distortions of diagnostic images or specimens that may result from electronic transmissions. I understand that the practice of medicine is not an exact science and that Practitioner makes no warranties or  guarantees regarding treatment outcomes. I acknowledge that I will receive a copy of this consent concurrently upon execution via email to the email address I last provided but may also request a printed copy by calling the office of [facility].    I understand that my insurance will be billed for this visit.   I have read or had this consent read to me. . I understand the contents of this consent, which adequately explains the benefits and risks of the Services being provided via telemedicine.  . I have been provided ample opportunity to ask questions regarding this consent and the Services and have had my questions answered to my satisfaction. . I give my informed consent for the services to be  provided through the use of telemedicine in my medical care  By participating in this telemedicine visit I agree to the above.

## 2018-09-06 ENCOUNTER — Telehealth: Payer: Self-pay | Admitting: Cardiology

## 2018-09-06 NOTE — Telephone Encounter (Signed)
Due to the recent COVID-19 pandemic, we are transitioning in-[place] office visits to tele-medicine visits in an effort to decrease unnecessary exposure to our patients, their families, and staff. These visits are billed to your insurance just like a normal visit is. We also encourage you to sign up for MyChart if you have not already done so. You will need a smartphone if possible. For patients that do not have this, we can still complete the visit using a regular telephone but do prefer a smartphone to enable video when possible. You may have a family member that lives with you that can help. If possible, we also ask that you have a blood pressure cuff and scale at home to measure your blood pressure, heart rate and weight prior to your scheduled appointment. Patients with clinical needs that need an in-[place] evaluation and testing will still be able to come to the office if absolutely necessary. If you have any questions, feel free to call our office.    YOUR PROVIDER WILL BE USING DOXIMITY or DOXY.ME - The staff will give you instructions on receiving your link to join the meeting the day of your visit.    THE DAY OF YOUR APPOINTMENT  Approximately 15 minutes prior to your scheduled appointment, you will receive a telephone call from one of [facility] team - your caller ID may say "Unknown caller."  Our staff will confirm medications, vital signs for the day and any symptoms you may be experiencing. Please have this information available prior to the time of visit start. It may also be helpful for you to have a pad of paper and pen handy for any instructions given during your visit. They will also walk you through joining the smartphone meeting if this is a video visit.    CONSENT FOR TELE-HEALTH VISIT - PLEASE REVIEW  I hereby voluntarily request, consent and authorize [facility] and its employed or contracted physicians, physician assistants, nurse practitioners or other licensed health care  professionals (the Practitioner), to provide me with telemedicine health care services (the "Services") as deemed necessary by the treating Practitioner. I acknowledge and consent to receive the Services by the Practitioner via telemedicine. I understand that the telemedicine visit will involve communicating with the Practitioner through live audiovisual communication technology and the disclosure of certain medical information by electronic transmission. I acknowledge that I have been given the opportunity to request an in-[place] assessment or other available alternative prior to the telemedicine visit and am voluntarily participating in the telemedicine visit.   I understand that I have the right to withhold or withdraw my consent to the use of telemedicine in the course of my care at any time, without affecting my right to future care or treatment, and that the Practitioner or I may terminate the telemedicine visit at any time. I understand that I have the right to inspect all information obtained and/or recorded in the course of the telemedicine visit and may receive copies of available information for a reasonable fee.  I understand that some of the potential risks of receiving the Services via telemedicine include:   Delay or interruption in medical evaluation due to technological equipment failure or disruption;  Information transmitted may not be sufficient (e.g. poor resolution of images) to allow for appropriate medical decision making by the Practitioner; and/or  In rare instances, security protocols could fail, causing a breach of personal health information.   Furthermore, I acknowledge that it is my responsibility to provide information about my   medical history, conditions and care that is complete and accurate to the best of my ability. I acknowledge that Practitioner's advice, recommendations, and/or decision may be based on factors not within their control, such as incomplete or  inaccurate data provided by me or distortions of diagnostic images or specimens that may result from electronic transmissions. I understand that the practice of medicine is not an exact science and that Practitioner makes no warranties or guarantees regarding treatment outcomes. I acknowledge that I will receive a copy of this consent concurrently upon execution via email to the email address I last provided but may also request a printed copy by calling the office of [facility].     I understand that my insurance will be billed for this visit.   I have read or had this consent read to me.  I understand the contents of this consent, which adequately explains the benefits and risks of the Services being provided via telemedicine.  I have been provided ample opportunity to ask questions regarding this consent and the Services and have had my questions answered to my satisfaction.  I give my informed consent for the services to be provided through the use of telemedicine in my medical care   By participating in this telemedicine visit I agree to the above.  

## 2018-09-07 ENCOUNTER — Encounter: Payer: Self-pay | Admitting: Cardiology

## 2018-09-07 ENCOUNTER — Telehealth: Payer: Self-pay | Admitting: *Deleted

## 2018-09-07 ENCOUNTER — Telehealth (INDEPENDENT_AMBULATORY_CARE_PROVIDER_SITE_OTHER): Payer: PPO | Admitting: Cardiology

## 2018-09-07 VITALS — BP 153/82 | HR 88 | Ht 64.5 in | Wt 228.0 lb

## 2018-09-07 DIAGNOSIS — R002 Palpitations: Secondary | ICD-10-CM | POA: Diagnosis not present

## 2018-09-07 DIAGNOSIS — I10 Essential (primary) hypertension: Secondary | ICD-10-CM

## 2018-09-07 DIAGNOSIS — I340 Nonrheumatic mitral (valve) insufficiency: Secondary | ICD-10-CM | POA: Diagnosis not present

## 2018-09-07 DIAGNOSIS — I359 Nonrheumatic aortic valve disorder, unspecified: Secondary | ICD-10-CM

## 2018-09-07 NOTE — Assessment & Plan Note (Signed)
Needs a pretty stable.  She has mentioned before the sensation of feeling fluttering in her chest that but is also noted feeling it or hearing it up in the ear.  She says today that this is the first time she felt that up in her head but I reviewed my previous notes and she did indicate having the sensation before.  Probably just simply feeling the PACs made worse by Vin-Parikh regurgitation. Relatively benign as there were PACs noted on EKG.   As long as her symptoms are not prolonged with a feeling the going very fast, I would probably avoid treating.  However, if she does need additional blood pressure control, would use a beta-blocker

## 2018-09-07 NOTE — Assessment & Plan Note (Signed)
By recent echo, was there is mild MR with no evidence of mitral prolapse.  I explained the pathophysiology of mitral vegetation helped blood pressure and fax this.  Her blood pressure little bit high today, will need to monitor closely.  However as her mitral valve is stable, we will simply follow-up with an echo next year.

## 2018-09-07 NOTE — Patient Instructions (Addendum)
Medication Instructions:   No change --   Please monitor BP level to make sure not consistently > 140/90 mmHg.  As long as heart skipping sensation is stable, would prefer not to treat with a medication.  Most likely benign extra /early beats.  If you need a refill on your cardiac medications before your next appointment, please call your pharmacy.   Lab work: None  If you have labs (blood work) drawn today and your tests are completely normal, you will receive your results only by: [name] Kitchen MyChart Message (if you have MyChart) OR . A paper copy in the mail If you have any lab test that is abnormal or we need to change your treatment, we will call you to review the results.  Testing/Procedures: None  Follow-Up: At [facility], you and your health needs are our priority.  As part of our continuing mission to provide you with exceptional heart care, we have created designated Provider Care Teams.  These Care Teams include your primary Cardiologist (physician) and Advanced Practice Providers (APPs -  Physician Assistants and Nurse Practitioners) who all work together to provide you with the care you need, when you need it. . You will need a follow up appointment in  12 months MAY 2021.  Please call our office 2 months in advance to schedule this appointment.  You may see Glenetta Hew, MD or one of the following Advanced Practice Providers on your designated Care Team:   . Rosaria Ferries, PA-C . Jory Sims, DNP, ANP  Any Other Special Instructions Will Be Listed Below (If Applicable).

## 2018-09-07 NOTE — Assessment & Plan Note (Signed)
Mild aortic stenosis with moderate regurgitation by recent echo.  This is been stable.  Okay to follow-up in 2021.  Continue blood pressure control and other risk factor modification.  Monitor for symptoms.

## 2018-09-07 NOTE — Progress Notes (Signed)
Virtual Visit via Video Note   This visit type was conducted due to national recommendations for restrictions regarding the COVID-19 Pandemic (e.g. social distancing) in an effort to limit this patient's exposure and mitigate transmission in our community.  Due to her co-morbid illnesses, this patient is at least at moderate risk for complications without adequate follow up.  This format is felt to be most appropriate for this patient at this time.  All issues noted in this document were discussed and addressed.  A limited physical exam was performed with this format.  Please refer to the patient's chart for her consent to telehealth for [facility].   Patient has given verbal permission to conduct this visit via virtual appointment and to bill insurance 09/06/2018 9:52 AM     Evaluation Performed:  Follow-up visit  Date:  09/07/2018   ID:  Lisa Miles, DOB 05-04-1950, MRN [id number]  Patient Location: Home Provider Location: Home  PCP:  Forrest Moron, MD  Cardiologist:  Glenetta Hew, MD  Electrophysiologist:  None   Chief Complaint:  Annual f/u - Aortic & Mitral Regurgitation.  History of Present Illness:    Lisa Miles is a 68 y.o. female with PMH notable for for heart disease/moderate LVH along with moderate aortic insufficiency and mild aortic stenosis who presents via audio/video conferencing for a telehealth visit today.  Kailani Brass Brymer was last seen in May 2019.  Doing well.  Had a nagging cough but that is about it.  No dyspnea.  No chest tightness or pressure. -> Follow-up echocardiogram ordered for October 2019-reviewed below.  Stable.  No mention of MVP.  Interval History:  Doing OK until last week or so -- having mucous / congestion & sore throat.  Hoping to get PCP evaluation & ? PNA vaccine. + cough (always there - trying to clear mucous).  On 5/10 - had HA; could feel Heart Beat vibrating in head & throat. Otherwise can fell occasionally in chest -feels  the heart beat.  This is the 1st time in the head.   Usually does note a little fast beating & can feel/hear it.  Resolves spontaneously. Occurs ~5-10/month.  Cardiovascular ROS: no chest pain or dyspnea on exertion positive for - palpitations and Irregular sensation of "hearing her heart beating, quivering "---noted above negative for - edema, orthopnea, paroxysmal nocturnal dyspnea, rapid heart rate, shortness of breath or Syncope/near syncope, TIA/amaurosis fugax  The patient does not have symptoms concerning for COVID-19 infection (fever, chills, cough, or new shortness of breath).  The patient is practicing social distancing.  ROS:  Please see the history of present illness.    Review of Systems  Constitutional: Negative for malaise/fatigue.  HENT: Positive for congestion and sore throat.   Respiratory: Positive for cough (with mucous congestion).   Musculoskeletal: Positive for joint pain (OA pains).  Neurological: Positive for headaches (with congestion).  Endo/Heme/Allergies: Positive for environmental allergies.    Past Medical History:  Diagnosis Date  . Aortic stenosis, mild 07/2012   Mild AS with Mod MR -(Mean/Peak Gradient 20 mmHg/34 mmHg).  . Asthma   . Bilateral bunions    Bunionectomies performed  . Bronchitis, chronic ([place])   . Heart disease   . Hyperlipidemia   . Hypertension    Good control  . Inguinodynia    Bilateral groin pain  . Lower extremity edema    Chronic. Venous Duplex 11/06/11 SUMMARY: 1) Bilateral Lower Extremities: No evidence of DVT or thrombophlebitis.  2)  Right Common Femoral Vein: Demonstrated mild valvular insufficiency with a greater than (1) sec of duration. Mildly abnormal LE Venous duplex Doppler.  . Mitral regurgitation 07/2012   Mild AMVL prolapse, Mod MR -- no MVP noted in 01/2018  . Obesity   . Palpitations    Relatively well controlled  . Scoliosis    DG Chest 2 View x-ray on 07/30/11 by Dr. Everlene Farrier shows a scoliosis.   Past Surgical  History:  Procedure Laterality Date  . ABDOMINAL HYSTERECTOMY    . ANKLE ARTHROSCOPY Left   . BUNIONECTOMY Bilateral   . KNEE ARTHROSCOPY Right   . Left knee open surgery    . TOTAL ABDOMINAL HYSTERECTOMY    . TRANSTHORACIC ECHOCARDIOGRAM  07/2012; 01/2013   a) 4/'14: mild-mod AS, Mod AI (mean/peak gradient: 18 mmHg / 31 mmhg).  Mild MVP w/ Mod MR, no MS.  Mildly elevated PAP (~37 mmHg) w/ Mod TR. EF 55-60%. GR 1 DD.;; b). 01/2013: EF 60-65%. No RWMA. Gr  DD. Mild AS (M / P Gradient 20 mmHg/34 mmHg). Mild AMVL Prolapse w/ Mod MR. Mod LA dilation. Mod TR.  [name] Kitchen TRANSTHORACIC ECHOCARDIOGRAM  01/2015; 01/2016   a) Normal LV size and function. GR 1 DD. Moderate LA Dilation. Calcified aortic valve with mild AS, moderate (2+) AI. Restricted posterior MV leaflet movement w/ Mod MR. Mode TR with mod elevated PA pressures (45 mmHg);; b) Relatively stable. Moderate concentric LVH. Moderate AI with mild AS (Mean-PeaK Gradient: 19 / 36 mmHg). Mild MR. Mod LA dilation. PAP ~40 mmHg -> plan follow-up 2020.  [name] Kitchen TRANSTHORACIC ECHOCARDIOGRAM  01/2018   Normal LV size.  Moderate concentric LVH.  EF 60 to 65%.  No R WMA.  GR 1 DD (high filling pressures).  Mild AS (mean gradient 16 mmHg) with moderate AI.  Severe LA dilation.  Mild MR.     Current Meds  Medication Sig  . albuterol (PROVENTIL HFA;VENTOLIN HFA) 108 (90 Base) MCG/ACT inhaler Inhale 2 puffs every 6 (six) hours as needed into the lungs.  [name] Kitchen amoxicillin (AMOXIL) 500 MG tablet Take 4 tablets (2,000 mg total) by mouth once as needed for up to 1 dose. Take prior to dental procedures  . aspirin 81 MG tablet Take by mouth.  . calcium-vitamin D (OSCAL WITH D) 250-125 MG-UNIT tablet Take 1 tablet by mouth daily.   . Cholecalciferol (VITAMIN D-3) 1000 UNITS CAPS Take 1 capsule by mouth daily.  [name] Kitchen estradiol (ESTRACE) 2 MG tablet Take 2 mg by mouth daily.  [name] Kitchen ibuprofen (ADVIL,MOTRIN) 600 MG tablet Take 1 tablet (600 mg total) by mouth every 8 (eight) hours as needed.   . valsartan (DIOVAN) 320 MG tablet Take 1 tablet (320 mg total) by mouth daily.     Allergies:   Patient has no known allergies.   Social History   Tobacco Use  . Smoking status: Never Smoker  . Smokeless tobacco: Never Used  Substance Use Topics  . Alcohol use: No  . Drug use: No     Family Hx: The patient's family history includes Arthritis in her mother; Cancer in her father; Diabetes in her brother, maternal grandfather, and mother; Gout in her mother; Hyperlipidemia in her mother; Hypertension in her brother, brother, and mother.   Prior CV studies:   The following studies were reviewed today: . TTE 01/2018: Normal LV size.  Moderate concentric LVH.  EF 60 to 65%.  No R WMA.  GR 1 DD (high filling pressures).  Mild AS (mean gradient  16 mmHg) with moderate AI.  Severe LA dilation.  Mild MR.  Labs/Other Tests and Data Reviewed:    EKG:  No ECG reviewed.  Recent Labs: 07/06/2018: ALT 11; BUN 14; Creatinine, Ser 0.80; Potassium 4.7; Sodium 136   Recent Lipid Panel Lab Results  Component Value Date   CHOL 180 07/06/2018   HDL 68 07/06/2018   LDLCALC 91 07/06/2018   TRIG 104 07/06/2018   CHOLHDL 2.6 07/06/2018     Wt Readings from Last 3 Encounters:  09/07/18 228 lb (103.4 kg)  07/06/18 229 lb 9.6 oz (104.1 kg)  04/01/18 221 lb (100.2 kg)     Objective:    Vital Signs:  BP (!) 153/82   Pulse 88   Ht 5' 4.5" (1.638 m)   Wt 228 lb (103.4 kg)   BMI 38.53 kg/m   - has HA. Yesterday was 135/70.  VITAL SIGNS:  reviewed GEN:  Well nourished, well developed female in no acute distress. RESPIRATORY:  normal respiratory effort, symmetric expansion CARDIOVASCULAR:  no peripheral edema NEURO:  alert and oriented x 3, no obvious focal deficit PSYCH:  normal affect   ASSESSMENT & PLAN:    Problem List Items Addressed This Visit    Aortic valve disorder - Primary (Chronic)    Mild aortic stenosis with moderate regurgitation by recent echo.  This is been stable.   Okay to follow-up in 2021.  Continue blood pressure control and other risk factor modification.  Monitor for symptoms.      Essential hypertension (Chronic)    Blood pressure is pretty high today, but usually is not.  She is on valsartan 300 mg daily. Next medicine to add with her having some palpitations would be a beta-blocker if she continues to be elevated.  Would probably try low-dose carvedilol.  However usually her blood pressures been controlled and her palpitations are pretty minor.  At this time we will simply discontinue ARB.      Heart palpitations (Chronic)    Needs a pretty stable.  She has mentioned before the sensation of feeling fluttering in her chest that but is also noted feeling it or hearing it up in the ear.  She says today that this is the first time she felt that up in her head but I reviewed my previous notes and she did indicate having the sensation before.  Probably just simply feeling the PACs made worse by Vin-Parikh regurgitation. Relatively benign as there were PACs noted on EKG.   As long as her symptoms are not prolonged with a feeling the going very fast, I would probably avoid treating.  However, if she does need additional blood pressure control, would use a beta-blocker      Mild mitral insufficiency (Chronic)    By recent echo, was there is mild MR with no evidence of mitral prolapse.  I explained the pathophysiology of mitral vegetation helped blood pressure and fax this.  Her blood pressure little bit high today, will need to monitor closely.  However as her mitral valve is stable, we will simply follow-up with an echo next year.         COVID-19 Education: The signs and symptoms of COVID-19 were discussed with the patient and how to seek care for testing (follow up with PCP or arrange E-visit).   The importance of social distancing was discussed today.  Time:   Today, I have spent 22 minutes with the patient with telehealth technology  discussing the above problems.  Medication Adjustments/Labs and Tests Ordered: Current medicines are reviewed at length with the patient today.  Concerns regarding medicines are outlined above.  Medication Instructions:  No change --   Please monitor BP level to make sure not consistently > 140/90 mmHg.  As long as heart skipping sensation is stable, would prefer not to treat with a medication.  Most likely benign extra /early beats.  Tests Ordered: No orders of the defined types were placed in this encounter.  no need for an Echocardiogram this year.  Will check next year in the fall.   Medication Changes: No orders of the defined types were placed in this encounter. n/a  Disposition:  Follow up in 1 year(s)    Signed, Glenetta Hew, MD  09/07/2018 1:31 PM    [facility]

## 2018-09-07 NOTE — Assessment & Plan Note (Signed)
Blood pressure is pretty high today, but usually is not.  She is on valsartan 300 mg daily. Next medicine to add with her having some palpitations would be a beta-blocker if she continues to be elevated.  Would probably try low-dose carvedilol.  However usually her blood pressures been controlled and her palpitations are pretty minor.  At this time we will simply discontinue ARB.

## 2018-09-07 NOTE — Telephone Encounter (Signed)
SPOKE TO A PATIENT  INSTRUCTION GIVEN FROM TE VISIT 5/13 .  AVS SUMMARY WILL BE SENT VIA MYCHART. PATIENT VERBALIZED UNDERSTANDING.

## 2018-09-13 ENCOUNTER — Telehealth (INDEPENDENT_AMBULATORY_CARE_PROVIDER_SITE_OTHER): Payer: PPO | Admitting: Family Medicine

## 2018-09-13 ENCOUNTER — Other Ambulatory Visit: Payer: Self-pay

## 2018-09-13 DIAGNOSIS — R05 Cough: Secondary | ICD-10-CM

## 2018-09-13 DIAGNOSIS — R059 Cough, unspecified: Secondary | ICD-10-CM

## 2018-09-13 NOTE — Progress Notes (Signed)
Telemedicine Encounter- SOAP NOTE Established Patient  I discussed the limitations, risks, security and privacy concerns of performing an evaluation and management service by telephone and the availability of in [place] appointments. I also discussed with the patient that there may be a patient responsible charge related to this service. The patient expressed understanding and agreed to proceed.  This telephone encounter was conducted with the patient's (or proxy's) verbal consent via audio telecommunications: yes Patient was instructed to have this encounter in a suitably private space; and to only have persons present to whom they give permission to participate. In addition, patient identity was confirmed by use of name plus two identifiers (DOB and address).  I spent a total of 20 min talking with the patient or their proxy. Cc: ongoing cough for 2 years-no fever  Subjective   Lisa Miles is a 68 y.o.female established patient. Telephone visit today for recurrent cough noted for several years. Pt states she saw an ENT for difficulty swallow and was evaluated and given medication. Pt can not remember medication she was given. Pt states she has used albuterol but does not feel that has helped her symptoms. Pt states in the distant past additional medications were given.  Pt has NO FEVER, no s/t, no diarrhea and has experienced the same cough intermittently since 2018. Pt states she has not tried mucinex. Pt non smoker. Pt has worked in [employer] in the past. No prior allergy/asthma evaluation. Pt has not had a pulmonary function test    Patient Active Problem List   Diagnosis Date Noted  . Primary osteoarthritis involving multiple joints 04/01/2018  . Chronic throat clearing 04/01/2018  . Urinary frequency 04/01/2018  . OAB (overactive bladder) 04/01/2018  . Cough 09/02/2017  . Visual changes 10/14/2016  . Menopause 08/20/2015  . Fungal dermatitis 05/23/2014  . Heart palpitations  10/19/2013  . Thyromegaly 03/28/2013  . Routine general medical examination at a health care facility 03/28/2013  . Obesity (BMI 30-39.9) 03/28/2013  . Lower extremity edema   . Inguinodynia 12/23/2012  . Essential hypertension 07/14/2012  . Bilateral groin pain 07/14/2012  . Dyslipidemia 05/30/2009  . Mild mitral insufficiency 05/30/2009  . Aortic valve disorder 05/30/2009  . ASTHMA UNSPECIFIED WITH EXACERBATION 04/02/2009    Past Medical History:  Diagnosis Date  . Aortic stenosis, mild 07/2012   Mild AS with Mod MR -(Mean/Peak Gradient 20 mmHg/34 mmHg).  . Asthma   . Bilateral bunions    Bunionectomies performed  . Bronchitis, chronic ([place])   . Heart disease   . Hyperlipidemia   . Hypertension    Good control  . Inguinodynia    Bilateral groin pain  . Lower extremity edema    Chronic. Venous Duplex 11/06/11 SUMMARY: 1) Bilateral Lower Extremities: No evidence of DVT or thrombophlebitis.  2) Right Common Femoral Vein: Demonstrated mild valvular insufficiency with a greater than (1) sec of duration. Mildly abnormal LE Venous duplex Doppler.  . Mitral regurgitation 07/2012   Mild AMVL prolapse, Mod MR -- no MVP noted in 01/2018  . Obesity   . Palpitations    Relatively well controlled  . Scoliosis    DG Chest 2 View x-ray on 07/30/11 by Dr. Everlene Farrier shows a scoliosis.    Current Outpatient Medications  Medication Sig Dispense Refill  . albuterol (PROVENTIL HFA;VENTOLIN HFA) 108 (90 Base) MCG/ACT inhaler Inhale 2 puffs every 6 (six) hours as needed into the lungs. 1 Inhaler 1  . amoxicillin (AMOXIL) 500 MG tablet Take  4 tablets (2,000 mg total) by mouth once as needed for up to 1 dose. Take prior to dental procedures 8 tablet 0  . aspirin 81 MG tablet Take by mouth.    . calcium-vitamin D (OSCAL WITH D) 250-125 MG-UNIT tablet Take 1 tablet by mouth daily.     . Cholecalciferol (VITAMIN D-3) 1000 UNITS CAPS Take 1 capsule by mouth daily.    [name] Kitchen estradiol (ESTRACE) 2 MG tablet Take  2 mg by mouth daily.    [name] Kitchen ibuprofen (ADVIL,MOTRIN) 600 MG tablet Take 1 tablet (600 mg total) by mouth every 8 (eight) hours as needed. 30 tablet 0  . valsartan (DIOVAN) 320 MG tablet Take 1 tablet (320 mg total) by mouth daily. 90 tablet 1   No current facility-administered medications for this visit.     No Known Allergies  Social History   Socioeconomic History  . Marital status: Married    Spouse name: Herbie Baltimore  . Number of children: 2  . Years of education: Not on file  . Highest education level: Not on file  Occupational History  . Occupation: production    Employer: [employer]    Comment: check printing/shipping  Social Needs  . Financial resource strain: Not on file  . Food insecurity:    Worry: Not on file    Inability: Not on file  . Transportation needs:    Medical: Not on file    Non-medical: Not on file  Tobacco Use  . Smoking status: Never Smoker  . Smokeless tobacco: Never Used  Substance and Sexual Activity  . Alcohol use: No  . Drug use: No  . Sexual activity: Never  Lifestyle  . Physical activity:    Days per week: Not on file    Minutes per session: Not on file  . Stress: Not on file  Relationships  . Social connections:    Talks on phone: Not on file    Gets together: Not on file    Attends religious service: Not on file    Active member of club or organization: Not on file    Attends meetings of clubs or organizations: Not on file    Relationship status: Not on file  . Intimate partner violence:    Fear of current or ex partner: Not on file    Emotionally abused: Not on file    Physically abused: Not on file    Forced sexual activity: Not on file  Other Topics Concern  . Not on file  Social History Narrative   Lives with her husband.  Their children live nearby.    ROS CONSTITUTIONAL: no fever EENT:, no facial tenderness, no sore throat CV: no chest pain RESP: no SOB, cough-intermittent for the last 2 years-no evaluation-given  albuterol and treated for bronchitis GI: no heartburn or bowel changes  Objective   Vitals as reported by the patient: None-televisit Diagnoses and all orders for this visit:  Cough   Pt with intermittent cough for 2 years. NO fever. No change in activity. Pt with concern for risk of COVID due to chronic medical problems and cough. Pt would like continued work up for this concern. No formalized testing in the past. Trial of mucinex DM otc BID. Referral to allergy and asthma I discussed the assessment and treatment plan with the patient. The patient was provided an opportunity to ask questions and all were answered. The patient agreed with the plan and demonstrated an understanding of the instructions.   The patient  was advised to call back or seek an in[place] evaluation if the symptoms worsen or if the condition fails to improve as anticipated.  I provided  20 minutes of non-face-to-face time during this encounter.  Naomi Fitton Hannah Beat, MD  Primary Care at [facility] 09-13-18

## 2018-09-13 NOTE — Progress Notes (Signed)
Pt states she has had a cough for months and it will not go away. She states there is no SHOB.

## 2018-09-14 ENCOUNTER — Telehealth: Payer: Self-pay | Admitting: Family Medicine

## 2018-09-14 NOTE — Telephone Encounter (Signed)
Copied from [place] [id number]. Topic: Referral - Status >> Sep 14, 2018  4:27 PM Margot Ables wrote: Reason for CRM: Pt called stating she received call from Allergy and Asthma but that her and Dr. Holly Bodily talked about a referral to pulmonary specialist. Pt requesting f/u with pulmonary. Please advise.

## 2018-09-21 DIAGNOSIS — Z1231 Encounter for screening mammogram for malignant neoplasm of breast: Secondary | ICD-10-CM | POA: Diagnosis not present

## 2018-09-21 NOTE — Telephone Encounter (Signed)
Pt stated she has an appt on 09/28/18 with Dr. Nelva Bush but she would like to get a referral to a pulmonary doctor who also specializes in allergy and asthma.

## 2018-09-22 ENCOUNTER — Other Ambulatory Visit: Payer: Self-pay

## 2018-09-22 ENCOUNTER — Ambulatory Visit (INDEPENDENT_AMBULATORY_CARE_PROVIDER_SITE_OTHER): Payer: PPO | Admitting: Family Medicine

## 2018-09-22 ENCOUNTER — Telehealth: Payer: Self-pay | Admitting: Family Medicine

## 2018-09-22 DIAGNOSIS — Z23 Encounter for immunization: Secondary | ICD-10-CM

## 2018-09-22 NOTE — Telephone Encounter (Signed)
Please advise. Dgaddy, CMA 

## 2018-09-26 ENCOUNTER — Telehealth: Payer: Self-pay | Admitting: Family Medicine

## 2018-09-26 DIAGNOSIS — H04123 Dry eye syndrome of bilateral lacrimal glands: Secondary | ICD-10-CM | POA: Diagnosis not present

## 2018-09-26 DIAGNOSIS — J410 Simple chronic bronchitis: Secondary | ICD-10-CM

## 2018-09-26 NOTE — Telephone Encounter (Signed)
Copied from [place] [id number]. Topic: General - Other >> Sep 26, 2018 12:06 PM Celene Kras A wrote: Reason for CRM: Pt called requesting to get a referral to a pulmonary specialist and is requesting to have it in the  network. Please advise.

## 2018-09-26 NOTE — Telephone Encounter (Signed)
Pt is requesting referral for pulmonary,. Last ov states that she has a cough that is not going away

## 2018-09-27 NOTE — Telephone Encounter (Signed)
Referral has been placed. 

## 2018-09-27 NOTE — Telephone Encounter (Signed)
Patient came in for shot

## 2018-09-28 ENCOUNTER — Ambulatory Visit: Payer: PPO | Admitting: Allergy

## 2018-11-07 ENCOUNTER — Other Ambulatory Visit: Payer: Self-pay | Admitting: Family Medicine

## 2018-11-07 ENCOUNTER — Ambulatory Visit (INDEPENDENT_AMBULATORY_CARE_PROVIDER_SITE_OTHER): Payer: PPO | Admitting: Family Medicine

## 2018-11-07 ENCOUNTER — Other Ambulatory Visit: Payer: Self-pay

## 2018-11-07 VITALS — BP 122/82 | Ht 65.0 in | Wt 227.0 lb

## 2018-11-07 DIAGNOSIS — I10 Essential (primary) hypertension: Secondary | ICD-10-CM

## 2018-11-07 DIAGNOSIS — Z0000 Encounter for general adult medical examination without abnormal findings: Secondary | ICD-10-CM | POA: Diagnosis not present

## 2018-11-07 NOTE — Patient Instructions (Signed)
Thank you for taking time to come for your Medicare Wellness Visit. I appreciate your ongoing commitment to your health goals. Please review the following plan we discussed and let me know if I can assist you in the future.  Jill Ruppe LPN  Preventive Care 68 Years and Older, Female Preventive care refers to lifestyle choices and visits with your health care provider that can promote health and wellness. This includes:  A yearly physical exam. This is also called an annual well check.  Regular dental and eye exams.  Immunizations.  Screening for certain conditions.  Healthy lifestyle choices, such as diet and exercise. What can I expect for my preventive care visit? Physical exam Your health care provider will check:  Height and weight. These may be used to calculate body mass index (BMI), which is a measurement that tells if you are at a healthy weight.  Heart rate and blood pressure.  Your skin for abnormal spots. Counseling Your health care provider may ask you questions about:  Alcohol, tobacco, and drug use.  Emotional well-being.  Home and relationship well-being.  Sexual activity.  Eating habits.  History of falls.  Memory and ability to understand (cognition).  Work and work environment.  Pregnancy and menstrual history. What immunizations do I need?  Influenza (flu) vaccine  This is recommended every year. Tetanus, diphtheria, and pertussis (Tdap) vaccine  You may need a Td booster every 10 years. Varicella (chickenpox) vaccine  You may need this vaccine if you have not already been vaccinated. Zoster (shingles) vaccine  You may need this after age 60. Pneumococcal conjugate (PCV13) vaccine  One dose is recommended after age 65. Pneumococcal polysaccharide (PPSV23) vaccine  One dose is recommended after age 65. Measles, mumps, and rubella (MMR) vaccine  You may need at least one dose of MMR if you were born in 1957 or later. You may also  need a second dose. Meningococcal conjugate (MenACWY) vaccine  You may need this if you have certain conditions. Hepatitis A vaccine  You may need this if you have certain conditions or if you travel or work in places where you may be exposed to hepatitis A. Hepatitis B vaccine  You may need this if you have certain conditions or if you travel or work in places where you may be exposed to hepatitis B. Haemophilus influenzae type b (Hib) vaccine  You may need this if you have certain conditions. You may receive vaccines as individual doses or as more than one vaccine together in one shot (combination vaccines). Talk with your health care provider about the risks and benefits of combination vaccines. What tests do I need? Blood tests  Lipid and cholesterol levels. These may be checked every 5 years, or more frequently depending on your overall health.  Hepatitis C test.  Hepatitis B test. Screening  Lung cancer screening. You may have this screening every year starting at age 55 if you have a 30-pack-year history of smoking and currently smoke or have quit within the past 15 years.  Colorectal cancer screening. All adults should have this screening starting at age 50 and continuing until age 75. Your health care provider may recommend screening at age 45 if you are at increased risk. You will have tests every 1-10 years, depending on your results and the type of screening test.  Diabetes screening. This is done by checking your blood sugar (glucose) after you have not eaten for a while (fasting). You may have this done every 1-3   years.  Mammogram. This may be done every 1-2 years. Talk with your health care provider about how often you should have regular mammograms.  BRCA-related cancer screening. This may be done if you have a family history of breast, ovarian, tubal, or peritoneal cancers. Other tests  Sexually transmitted disease (STD) testing.  Bone density scan. This is done  to screen for osteoporosis. You may have this done starting at age 6. Follow these instructions at home: Eating and drinking  Eat a diet that includes fresh fruits and vegetables, whole grains, lean protein, and low-fat dairy products. Limit your intake of foods with high amounts of sugar, saturated fats, and salt.  Take vitamin and mineral supplements as recommended by your health care provider.  Do not drink alcohol if your health care provider tells you not to drink.  If you drink alcohol: ? Limit how much you have to 0-1 drink a day. ? Be aware of how much alcohol is in your drink. In the [place] one drink equals one 12 oz bottle of beer (355 mL), one 5 oz glass of wine (148 mL), or one 1 oz glass of hard liquor (44 mL). Lifestyle  Take daily care of your teeth and gums.  Stay active. Exercise for at least 30 minutes on 5 or more days each week.  Do not use any products that contain nicotine or tobacco, such as cigarettes, e-cigarettes, and chewing tobacco. If you need help quitting, ask your health care provider.  If you are sexually active, practice safe sex. Use a condom or other form of protection in order to prevent STIs (sexually transmitted infections).  Talk with your health care provider about taking a low-dose aspirin or statin. What's next?  Go to your health care provider once a year for a well check visit.  Ask your health care provider how often you should have your eyes and teeth checked.  Stay up to date on all vaccines. This information is not intended to replace advice given to you by your health care provider. Make sure you discuss any questions you have with your health care provider. Document Released: 05/10/2015 Document Revised: 04/07/2018 Document Reviewed: 04/07/2018 Elsevier Patient Education  2020 [employer].

## 2018-11-07 NOTE — Progress Notes (Signed)
Presents today for [employer] Visit   Date of last exam: 09/22/2018  Interpreter used for this visit? No  Appointment done in office at [place] [facility]  Patient Care Team: Forrest Moron, MD as PCP - General (Internal Medicine) Leonie Man, MD as PCP - Cardiology (Cardiology) Leonie Man, MD as Consulting Physician (Cardiology) Maisie Fus, MD as Consulting Physician (Obstetrics and Gynecology)   Other items to address today:  Discussed Eye/Dental Discussed immunizations Patient will call schedule f/u with ENT  Other Screening: Last screening for diabetes: 07/06/2018 Last lipid screening:07/06/2018  ADVANCE DIRECTIVES: Discussed: yes On File: no Materials Provided: yes    Immunization status:  Immunization History  Administered Date(s) Administered  . Influenza Whole 03/27/2009  . Influenza, High Dose Seasonal PF 04/01/2018  . Influenza,inj,Quad PF,6+ Mos 03/15/2012, 03/28/2013, 07/22/2016  . Pneumococcal Conjugate-13 07/22/2016  . Pneumococcal Polysaccharide-23 04/02/2009, 09/22/2018  . Tdap 03/28/2013     There are no preventive care reminders to display for this patient.   Functional Status Survey: Is the patient deaf or have difficulty hearing?: No Does the patient have difficulty seeing, even when wearing glasses/contacts?: No Does the patient have difficulty concentrating, remembering, or making decisions?: No Does the patient have difficulty walking or climbing stairs?: No Does the patient have difficulty dressing or bathing?: No Does the patient have difficulty doing errands alone such as visiting a doctor's office or shopping?: No   6CIT Screen 11/07/2018 11/07/2018  What Year? 0 points 0 points  What month? 0 points 0 points  What time? 0 points 0 points  Count back from 20 0 points 0 points  Months in reverse 0 points 0 points  Repeat phrase 0 points -  Total Score 0 -        Clinical Support from  11/07/2018 in Primary Care at [place]  AUDIT-C Score  0       Home Environment:    Lives in one story home  Has trouble with knees can get up if there is a railings Yes scattered rug No grab bars No clutter  Has adequate lighting  Patient able to get up 30 seconds from sitting position        Patient Active Problem List   Diagnosis Date Noted  . Primary osteoarthritis involving multiple joints 04/01/2018  . Chronic throat clearing 04/01/2018  . Urinary frequency 04/01/2018  . OAB (overactive bladder) 04/01/2018  . Cough 09/02/2017  . Visual changes 10/14/2016  . Menopause 08/20/2015  . Fungal dermatitis 05/23/2014  . Heart palpitations 10/19/2013  . Thyromegaly 03/28/2013  . Routine general medical examination at a health care facility 03/28/2013  . Obesity (BMI 30-39.9) 03/28/2013  . Lower extremity edema   . Inguinodynia 12/23/2012  . Essential hypertension 07/14/2012  . Bilateral groin pain 07/14/2012  . Dyslipidemia 05/30/2009  . Mild mitral insufficiency 05/30/2009  . Aortic valve disorder 05/30/2009  . ASTHMA UNSPECIFIED WITH EXACERBATION 04/02/2009     Past Medical History:  Diagnosis Date  . Aortic stenosis, mild 07/2012   Mild AS with Mod MR -(Mean/Peak Gradient 20 mmHg/34 mmHg).  . Asthma   . Bilateral bunions    Bunionectomies performed  . Bronchitis, chronic ([place])   . Heart disease   . Hyperlipidemia   . Hypertension    Good control  . Inguinodynia    Bilateral groin pain  . Lower extremity edema    Chronic. Venous Duplex 11/06/11 SUMMARY: 1) Bilateral Lower Extremities:  No evidence of DVT or thrombophlebitis.  2) Right Common Femoral Vein: Demonstrated mild valvular insufficiency with a greater than (1) sec of duration. Mildly abnormal LE Venous duplex Doppler.  . Mitral regurgitation 07/2012   Mild AMVL prolapse, Mod MR -- no MVP noted in 01/2018  . Obesity   . Palpitations    Relatively well controlled  . Scoliosis    DG Chest 2 View  x-ray on 07/30/11 by Dr. Everlene Farrier shows a scoliosis.     Past Surgical History:  Procedure Laterality Date  . ABDOMINAL HYSTERECTOMY    . ANKLE ARTHROSCOPY Left   . BUNIONECTOMY Bilateral   . KNEE ARTHROSCOPY Right   . Left knee open surgery    . TOTAL ABDOMINAL HYSTERECTOMY    . TRANSTHORACIC ECHOCARDIOGRAM  07/2012; 01/2013   a) 4/'14: mild-mod AS, Mod AI (mean/peak gradient: 18 mmHg / 31 mmhg).  Mild MVP w/ Mod MR, no MS.  Mildly elevated PAP (~37 mmHg) w/ Mod TR. EF 55-60%. GR 1 DD.;; b). 01/2013: EF 60-65%. No RWMA. Gr  DD. Mild AS (M / P Gradient 20 mmHg/34 mmHg). Mild AMVL Prolapse w/ Mod MR. Mod LA dilation. Mod TR.  [name] Kitchen TRANSTHORACIC ECHOCARDIOGRAM  01/2015; 01/2016   a) Normal LV size and function. GR 1 DD. Moderate LA Dilation. Calcified aortic valve with mild AS, moderate (2+) AI. Restricted posterior MV leaflet movement w/ Mod MR. Mode TR with mod elevated PA pressures (45 mmHg);; b) Relatively stable. Moderate concentric LVH. Moderate AI with mild AS (Mean-PeaK Gradient: 19 / 36 mmHg). Mild MR. Mod LA dilation. PAP ~40 mmHg -> plan follow-up 2020.  [name] Kitchen TRANSTHORACIC ECHOCARDIOGRAM  01/2018   Normal LV size.  Moderate concentric LVH.  EF 60 to 65%.  No R WMA.  GR 1 DD (high filling pressures).  Mild AS (mean gradient 16 mmHg) with moderate AI.  Severe LA dilation.  Mild MR.     Family History  Problem Relation Age of Onset  . Arthritis Mother   . Hyperlipidemia Mother   . Diabetes Mother   . Gout Mother   . Hypertension Mother   . Cancer Father        GI cancer  . Diabetes Maternal Grandfather   . Hypertension Brother   . Diabetes Brother   . Hypertension Brother      Social History   Socioeconomic History  . Marital status: Married    Spouse name: Herbie Baltimore  . Number of children: 2  . Years of education: Not on file  . Highest education level: Not on file  Occupational History  . Occupation: production    Employer: [employer]    Comment: check printing/shipping   Social Needs  . Financial resource strain: Not on file  . Food insecurity    Worry: Not on file    Inability: Not on file  . Transportation needs    Medical: Not on file    Non-medical: Not on file  Tobacco Use  . Smoking status: Never Smoker  . Smokeless tobacco: Never Used  Substance and Sexual Activity  . Alcohol use: No  . Drug use: No  . Sexual activity: Never  Lifestyle  . Physical activity    Days per week: Not on file    Minutes per session: Not on file  . Stress: Not on file  Relationships  . Social Herbalist on phone: Not on file    Gets together: Not on file    Attends  religious service: Not on file    Active member of club or organization: Not on file    Attends meetings of clubs or organizations: Not on file    Relationship status: Not on file  . Intimate partner violence    Fear of current or ex partner: Not on file    Emotionally abused: Not on file    Physically abused: Not on file    Forced sexual activity: Not on file  Other Topics Concern  . Not on file  Social History Narrative   Lives with her husband.  Their children live nearby.     No Known Allergies   Prior to Admission medications   Medication Sig Start Date End Date Taking? Authorizing Provider  aspirin 81 MG tablet Take by mouth.   Yes [provider]  Cholecalciferol (VITAMIN D-3) 1000 UNITS CAPS Take 1 capsule by mouth daily.   Yes [provider]  estradiol (ESTRACE) 2 MG tablet Take 2 mg by mouth daily.   Yes [provider]  valsartan (DIOVAN) 320 MG tablet Take 1 tablet (320 mg total) by mouth daily. KEEP UPCOMING APPOINTMENT 11/07/18  Yes Delia Chimes A, MD  albuterol (PROVENTIL HFA;VENTOLIN HFA) 108 (90 Base) MCG/ACT inhaler Inhale 2 puffs every 6 (six) hours as needed into the lungs. Patient not taking: Reported on 11/07/2018 03/04/17   Jaynee Eagles, PA-C  amoxicillin (AMOXIL) 500 MG tablet Take 4 tablets (2,000 mg total) by mouth once as  needed for up to 1 dose. Take prior to dental procedures 07/06/18   Forrest Moron, MD  calcium-vitamin D (OSCAL WITH D) 250-125 MG-UNIT tablet Take 1 tablet by mouth daily.     [provider]  ibuprofen (ADVIL,MOTRIN) 600 MG tablet Take 1 tablet (600 mg total) by mouth every 8 (eight) hours as needed. Patient not taking: Reported on 11/07/2018 02/18/17   Forrest Moron, MD     Depression screen [facility] 2/9 11/07/2018 09/13/2018 07/06/2018 04/01/2018 10/21/2017  Decreased Interest 0 0 0 0 0  Down, Depressed, Hopeless 0 0 0 0 0  PHQ - 2 Score 0 0 0 0 0     Fall Risk  11/07/2018 09/13/2018 07/06/2018 04/01/2018 10/21/2017  Falls in the past year? 0 0 0 0 No  Number falls in past yr: 0 - 0 - -  Injury with Fall? 0 0 0 - -  Follow up Falls evaluation completed;Education provided;Falls prevention discussed - Falls evaluation completed - -      PHYSICAL EXAM: Ht 5\' 5"  (1.651 m)   Wt 227 lb (103 kg)   BMI 37.77 kg/m    Wt Readings from Last 3 Encounters:  11/07/18 227 lb (103 kg)  09/07/18 228 lb (103.4 kg)  07/06/18 229 lb 9.6 oz (104.1 kg)     No exam data present    Physical Exam   Education/Counseling provided regarding diet and exercise, prevention of chronic diseases, smoking/tobacco cessation, if applicable, and reviewed "Covered Medicare Preventive Services."

## 2018-12-06 ENCOUNTER — Other Ambulatory Visit: Payer: Self-pay | Admitting: *Deleted

## 2018-12-06 DIAGNOSIS — Z20822 Contact with and (suspected) exposure to covid-19: Secondary | ICD-10-CM

## 2018-12-08 LAB — NOVEL CORONAVIRUS, NAA: SARS-CoV-2, NAA: NOT DETECTED

## 2019-01-06 ENCOUNTER — Other Ambulatory Visit: Payer: Self-pay

## 2019-01-06 ENCOUNTER — Ambulatory Visit (INDEPENDENT_AMBULATORY_CARE_PROVIDER_SITE_OTHER): Payer: PPO | Admitting: Family Medicine

## 2019-01-06 DIAGNOSIS — Z23 Encounter for immunization: Secondary | ICD-10-CM | POA: Diagnosis not present

## 2019-01-06 NOTE — Progress Notes (Signed)
Nurse visit for high does flu shot

## 2019-02-04 ENCOUNTER — Other Ambulatory Visit: Payer: Self-pay | Admitting: Family Medicine

## 2019-02-04 DIAGNOSIS — I10 Essential (primary) hypertension: Secondary | ICD-10-CM

## 2019-05-06 ENCOUNTER — Other Ambulatory Visit: Payer: Self-pay | Admitting: Family Medicine

## 2019-05-06 DIAGNOSIS — I10 Essential (primary) hypertension: Secondary | ICD-10-CM

## 2019-07-05 ENCOUNTER — Other Ambulatory Visit: Payer: Self-pay

## 2019-07-05 ENCOUNTER — Telehealth (INDEPENDENT_AMBULATORY_CARE_PROVIDER_SITE_OTHER): Payer: PPO | Admitting: Family Medicine

## 2019-07-05 DIAGNOSIS — R519 Headache, unspecified: Secondary | ICD-10-CM

## 2019-07-05 DIAGNOSIS — H539 Unspecified visual disturbance: Secondary | ICD-10-CM | POA: Diagnosis not present

## 2019-07-05 NOTE — Progress Notes (Signed)
Telemedicine Encounter- SOAP NOTE Established Patient  This telephone encounter was conducted with the patient's (or proxy's) verbal consent via audio telecommunications: yes/no: Yes Patient was instructed to have this encounter in a suitably private space; and to only have persons present to whom they give permission to participate. In addition, patient identity was confirmed by use of name plus two identifiers (DOB and address).  I discussed the limitations, risks, security and privacy concerns of performing an evaluation and management service by telephone and the availability of in [place] appointments. I also discussed with the patient that there may be a patient responsible charge related to this service. The patient expressed understanding and agreed to proceed.  I spent a total of TIME; 0 MIN TO 60 MIN: 15 minutes talking with the patient or their proxy.  Chief Complaint  Patient presents with  . Headache    ha's 6 day a week, ibuprofen for ha and pain level 7/10. Thinks it is her eyes, she went to see Eye dr and given new rx for glasses -will receive new glasses in 1-2 weeks.  Per pt difficulty  driving at night and  thinks new glasses may help with this.    . mucus buildup in throat    food/liquids get trapped in throat and cause choking, consistent mucus buildup and always coughing to clear throat.    Subjective   Lisa Miles is a 69 y.o. established patient. Telephone visit today for  HPI  She reports that her vision is getting worse She states that she got new glasses She states that her balance is getting worse She has headaches six days a week She reports that she tries to avoid driving at night because she cannot see as well as she would like to see. She reports that the headache is 7/10 She reports that the headaches first started about 2-3 months ago She denies worsening of the headache  She states that it is there pretty much all the time.  She denies nausea  or tinnitus She denies muscle weakness She denies facial droop   BP Readings from Last 3 Encounters:  11/07/18 122/82  09/07/18 (!) 153/82  07/06/18 127/79     Patient Active Problem List   Diagnosis Date Noted  . Primary osteoarthritis involving multiple joints 04/01/2018  . Chronic throat clearing 04/01/2018  . Urinary frequency 04/01/2018  . OAB (overactive bladder) 04/01/2018  . Cough 09/02/2017  . Visual changes 10/14/2016  . Menopause 08/20/2015  . Fungal dermatitis 05/23/2014  . Heart palpitations 10/19/2013  . Thyromegaly 03/28/2013  . Routine general medical examination at a health care facility 03/28/2013  . Obesity (BMI 30-39.9) 03/28/2013  . Lower extremity edema   . Inguinodynia 12/23/2012  . Essential hypertension 07/14/2012  . Bilateral groin pain 07/14/2012  . Dyslipidemia 05/30/2009  . Mild mitral insufficiency 05/30/2009  . Aortic valve disorder 05/30/2009  . ASTHMA UNSPECIFIED WITH EXACERBATION 04/02/2009    Past Medical History:  Diagnosis Date  . Aortic stenosis, mild 07/2012   Mild AS with Mod MR -(Mean/Peak Gradient 20 mmHg/34 mmHg).  . Asthma   . Bilateral bunions    Bunionectomies performed  . Bronchitis, chronic ([place])   . Heart disease   . Hyperlipidemia   . Hypertension    Good control  . Inguinodynia    Bilateral groin pain  . Lower extremity edema    Chronic. Venous Duplex 11/06/11 SUMMARY: 1) Bilateral Lower Extremities: No evidence of DVT or thrombophlebitis.  2) Right Common Femoral Vein: Demonstrated mild valvular insufficiency with a greater than (1) sec of duration. Mildly abnormal LE Venous duplex Doppler.  . Mitral regurgitation 07/2012   Mild AMVL prolapse, Mod MR -- no MVP noted in 01/2018  . Obesity   . Palpitations    Relatively well controlled  . Scoliosis    DG Chest 2 View x-ray on 07/30/11 by Dr. Everlene Farrier shows a scoliosis.    Current Outpatient Medications  Medication Sig Dispense Refill  . aspirin 81 MG tablet Take  by mouth.    . calcium-vitamin D (OSCAL WITH D) 250-125 MG-UNIT tablet Take 1 tablet by mouth daily.     . Cholecalciferol (VITAMIN D-3) 1000 UNITS CAPS Take 1 capsule by mouth daily.    [name] Kitchen estradiol (ESTRACE) 2 MG tablet Take 2 mg by mouth daily.    [name] Kitchen ibuprofen (ADVIL,MOTRIN) 600 MG tablet Take 1 tablet (600 mg total) by mouth every 8 (eight) hours as needed. 30 tablet 0  . valsartan (DIOVAN) 320 MG tablet Take 1 tablet by mouth once daily 90 tablet 0  . albuterol (PROVENTIL HFA;VENTOLIN HFA) 108 (90 Base) MCG/ACT inhaler Inhale 2 puffs every 6 (six) hours as needed into the lungs. (Patient not taking: Reported on 11/07/2018) 1 Inhaler 1  . amoxicillin (AMOXIL) 500 MG tablet Take 4 tablets (2,000 mg total) by mouth once as needed for up to 1 dose. Take prior to dental procedures (Patient not taking: Reported on 07/05/2019) 8 tablet 0   No current facility-administered medications for this visit.    No Known Allergies  Social History   Socioeconomic History  . Marital status: Married    Spouse name: Herbie Baltimore  . Number of children: 2  . Years of education: Not on file  . Highest education level: Not on file  Occupational History  . Occupation: production    Employer: [employer]    Comment: check printing/shipping  Tobacco Use  . Smoking status: Never Smoker  . Smokeless tobacco: Never Used  Substance and Sexual Activity  . Alcohol use: No  . Drug use: No  . Sexual activity: Never  Other Topics Concern  . Not on file  Social History Narrative   Lives with her husband.  Their children live nearby.   Social Determinants of Health   Financial Resource Strain:   . Difficulty of Paying Living Expenses: Not on file  Food Insecurity:   . Worried About Charity fundraiser in the Last Year: Not on file  . Ran Out of Food in the Last Year: Not on file  Transportation Needs:   . Lack of Transportation (Medical): Not on file  . Lack of Transportation (Non-Medical): Not on file    Physical Activity:   . Days of Exercise per Week: Not on file  . Minutes of Exercise per Session: Not on file  Stress:   . Feeling of Stress : Not on file  Social Connections:   . Frequency of Communication with Friends and Family: Not on file  . Frequency of Social Gatherings with Friends and Family: Not on file  . Attends Religious Services: Not on file  . Active Member of Clubs or Organizations: Not on file  . Attends Archivist Meetings: Not on file  . Marital Status: Not on file  Intimate Partner Violence:   . Fear of Current or Ex-Partner: Not on file  . Emotionally Abused: Not on file  . Physically Abused: Not on file  . Sexually  Abused: Not on file    ROS See hpi  Objective   Unable to do visit   Vitals as reported by the patient: There were no vitals filed for this visit.  Ivory was seen today for headache and mucus buildup in throat.  Diagnoses and all orders for this visit:  Frequent headaches-  Will refer to Neurology regarding vision changes since the eye doctor is documenting 20/20 vision -     Ambulatory referral to Neurology  Vision Changes - discussed wearing new glasses    I discussed the assessment and treatment plan with the patient. The patient was provided an opportunity to ask questions and all were answered. The patient agreed with the plan and demonstrated an understanding of the instructions.   The patient was advised to call back or seek an in-[place] evaluation if the symptoms worsen or if the condition fails to improve as anticipated.  I provided 15 minutes of non-face-to-face time during this encounter.  Forrest Moron, MD  Primary Care at [facility]

## 2019-07-05 NOTE — Patient Instructions (Signed)
° ° ° °  If you have lab work done today you will be contacted with your lab results within the next 2 weeks.  If you have not heard from us then please contact us. The fastest way to get your results is to register for My Chart. ° ° °IF you received an x-ray today, you will receive an invoice from [facility]. Please contact [facility] at [phone] with questions or concerns regarding your invoice.  ° °IF you received labwork today, you will receive an invoice from [facility]. Please contact [facility] at [phone] with questions or concerns regarding your invoice.  ° °Our billing staff will not be able to assist you with questions regarding bills from these companies. ° °You will be contacted with the lab results as soon as they are available. The fastest way to get your results is to activate your My Chart account. Instructions are located on the last page of this paperwork. If you have not heard from us regarding the results in 2 weeks, please contact this office. °  ° ° ° °

## 2019-07-07 NOTE — Progress Notes (Signed)
Scheduled

## 2019-08-02 ENCOUNTER — Other Ambulatory Visit: Payer: Self-pay | Admitting: Family Medicine

## 2019-08-02 ENCOUNTER — Ambulatory Visit (INDEPENDENT_AMBULATORY_CARE_PROVIDER_SITE_OTHER): Payer: PPO | Admitting: Family Medicine

## 2019-08-02 ENCOUNTER — Other Ambulatory Visit: Payer: Self-pay

## 2019-08-02 ENCOUNTER — Encounter: Payer: Self-pay | Admitting: Family Medicine

## 2019-08-02 VITALS — BP 116/62 | HR 63 | Temp 97.5°F | Resp 17 | Ht 65.0 in | Wt 219.0 lb

## 2019-08-02 DIAGNOSIS — R6889 Other general symptoms and signs: Secondary | ICD-10-CM | POA: Diagnosis not present

## 2019-08-02 DIAGNOSIS — R7303 Prediabetes: Secondary | ICD-10-CM

## 2019-08-02 DIAGNOSIS — R05 Cough: Secondary | ICD-10-CM

## 2019-08-02 DIAGNOSIS — R059 Cough, unspecified: Secondary | ICD-10-CM

## 2019-08-02 DIAGNOSIS — R0989 Other specified symptoms and signs involving the circulatory and respiratory systems: Secondary | ICD-10-CM | POA: Diagnosis not present

## 2019-08-02 DIAGNOSIS — R350 Frequency of micturition: Secondary | ICD-10-CM | POA: Diagnosis not present

## 2019-08-02 DIAGNOSIS — R198 Other specified symptoms and signs involving the digestive system and abdomen: Secondary | ICD-10-CM

## 2019-08-02 LAB — POCT URINALYSIS DIP (MANUAL ENTRY)
Bilirubin, UA: NEGATIVE
Glucose, UA: NEGATIVE mg/dL
Leukocytes, UA: NEGATIVE
Nitrite, UA: NEGATIVE
Spec Grav, UA: >=1.03 — AB (ref 1.010–1.025)
Urobilinogen, UA: 0.2 E.U./dL
pH, UA: 5.5 (ref 5.0–8.0)

## 2019-08-02 LAB — POCT GLYCOSYLATED HEMOGLOBIN (HGB A1C): Hemoglobin A1C: 4.6 % (ref 4.0–5.6)

## 2019-08-02 MED ORDER — ESOMEPRAZOLE MAGNESIUM 20 MG PO CPDR: 20.0000 mg | DELAYED_RELEASE_CAPSULE | Freq: Two times a day (BID) | ORAL | 1 refills | Status: DC

## 2019-08-02 NOTE — Patient Instructions (Addendum)
Wt Readings from Last 3 Encounters:  08/02/19 219 lb (99.3 kg)  11/07/18 227 lb (103 kg)  09/07/18 228 lb (103.4 kg)      If you have lab work done today you will be contacted with your lab results within the next 2 weeks.  If you have not heard from [place] then please contact us. The fastest way to get your results is to register for My Chart.   IF you received an x-ray today, you will receive an invoice from [facility]. Please contact [facility] at [phone] with questions or concerns regarding your invoice.   IF you received labwork today, you will receive an invoice from [facility]. Please contact [facility] at [phone] with questions or concerns regarding your invoice.   Our billing staff will not be able to assist you with questions regarding bills from these companies.  You will be contacted with the lab results as soon as they are available. The fastest way to get your results is to activate your My Chart account. Instructions are located on the last page of this paperwork. If you have not heard from [place] regarding the results in 2 weeks, please contact this office.     Globus Pharyngeus Globus pharyngeus is a condition that makes it feel like you have a lump in your throat. It may also feel like you have something stuck in the front of your throat. This feeling may come and go. It is not painful, and it does not make it harder to swallow food or liquid. Globus pharyngeus does not cause changes that a health care provider can see during a physical exam. This condition usually goes away without treatment. What are the causes? Often, no cause can be found. The most common cause of globus pharyngeus is a condition that causes stomach juices to flow back up into the throat (gastroesophageal reflux). Other possible causes include:  Overstimulation of nerves that control swallowing.  Irritation of nerves that control swallowing (neuralgia).  An enlarged gland in  the lower neck (thyroid gland).  Growth of tonsil tissue at the base of the tongue (lingual tonsil).  Anxiety.  Depression. What are the signs or symptoms? The main symptom of this condition is a feeling of a lump in your throat. This feeling usually comes and goes. How is this diagnosed? This condition may be diagnosed after other conditions have been ruled out. You may have tests, such as:  A swallow study.  Ear, nose, and throat evaluation.  An exam of your throat using a thin, flexible tube with a light and camera on the end (endoscopy). How is this treated? This condition may go away on its own, without treatment. In some cases, antidepressant medicines may be helpful. Follow these instructions at home:   Follow instructions from your health care provider about eating or drinking restrictions.  Take over-the-counter and prescription medicines only as told by your health care provider.  Keep all follow-up visits as told by your health care provider. This is important.  Follow instructions from your health care provider about home care for gastroesophageal reflux. Your health care provider may recommend that you: ? Do not eat or drink anything that causes heartburn. ? Do not eat heavy meals close to bedtime. ? Do not drink caffeine. ? Do not drink alcohol. ? Raise the head of your bed. ? Sleep on your left side. Contact a health care provider if:  Your symptoms get worse.  You have throat pain.  You have  trouble swallowing.  Food or liquid comes back up into your mouth.  You lose weight without trying. Get help right away if:  You develop swelling in your throat. Summary  Globus pharyngeus is a condition that makes it feel like you have a lump in your throat.  This condition usually goes away without treatment. This information is not intended to replace advice given to you by your health care provider. Make sure you discuss any questions you have with your  health care provider. Document Revised: 03/26/2017 Document Reviewed: 12/18/2015 Elsevier Patient Education  2020 [employer].

## 2019-08-02 NOTE — Telephone Encounter (Signed)
Patient requesting 90 days supply for medication only written for 60 day supply 0 RF- sent for review of request. ( Patient seen in office today.

## 2019-08-02 NOTE — Progress Notes (Signed)
Established Patient Office Visit  Subjective:  Patient ID: Lisa Miles, female    DOB: 30-Oct-1950  Age: 69 y.o. MRN: [id number]  CC:  Chief Complaint  Patient presents with  . Hypertension    recheck    HPI [place] Razzak Michetti presents for   Hypertension: Patient here for follow-up of elevated blood pressure. She is exercising and is adherent to low salt diet.  Blood pressure is well controlled at home. Cardiac symptoms none. Patient denies chest pain, claudication, dyspnea and fatigue.  Cardiovascular risk factors: hypertension. Use of agents associated with hypertension: none. History of target organ damage: none.  She is taking valsartan for her blood pressures.  BP Readings from Last 3 Encounters:  08/02/19 116/62  11/07/18 122/82  09/07/18 (!) 153/82   Globus sensation  She reports that under her tongue to the back of her throat is sore once every month. Her right nostril is partially blocked She has constant throat clearing and coughing . She feels like it is hard to swallow or allow food to pass.  She is moving mucus up and sometimes streaks of blood.   Past Medical History:  Diagnosis Date  . Aortic stenosis, mild 07/2012   Mild AS with Mod MR -(Mean/Peak Gradient 20 mmHg/34 mmHg).  . Asthma   . Bilateral bunions    Bunionectomies performed  . Bronchitis, chronic ([place])   . Heart disease   . Hyperlipidemia   . Hypertension    Good control  . Inguinodynia    Bilateral groin pain  . Lower extremity edema    Chronic. Venous Duplex 11/06/11 SUMMARY: 1) Bilateral Lower Extremities: No evidence of DVT or thrombophlebitis.  2) Right Common Femoral Vein: Demonstrated mild valvular insufficiency with a greater than (1) sec of duration. Mildly abnormal LE Venous duplex Doppler.  . Mitral regurgitation 07/2012   Mild AMVL prolapse, Mod MR -- no MVP noted in 01/2018  . Obesity   . Palpitations    Relatively well controlled  . Scoliosis    DG Chest 2 View x-ray on 07/30/11  by Dr. Everlene Farrier shows a scoliosis.    Past Surgical History:  Procedure Laterality Date  . ABDOMINAL HYSTERECTOMY    . ANKLE ARTHROSCOPY Left   . BUNIONECTOMY Bilateral   . KNEE ARTHROSCOPY Right   . Left knee open surgery    . TOTAL ABDOMINAL HYSTERECTOMY    . TRANSTHORACIC ECHOCARDIOGRAM  07/2012; 01/2013   a) 4/'14: mild-mod AS, Mod AI (mean/peak gradient: 18 mmHg / 31 mmhg).  Mild MVP w/ Mod MR, no MS.  Mildly elevated PAP (~37 mmHg) w/ Mod TR. EF 55-60%. GR 1 DD.;; b). 01/2013: EF 60-65%. No RWMA. Gr  DD. Mild AS (M / P Gradient 20 mmHg/34 mmHg). Mild AMVL Prolapse w/ Mod MR. Mod LA dilation. Mod TR.  [name] Kitchen TRANSTHORACIC ECHOCARDIOGRAM  01/2015; 01/2016   a) Lisa LV size and function. GR 1 DD. Moderate LA Dilation. Calcified aortic valve with mild AS, moderate (2+) AI. Restricted posterior MV leaflet movement w/ Mod MR. Mode TR with mod elevated PA pressures (45 mmHg);; b) Relatively stable. Moderate concentric LVH. Moderate AI with mild AS (Mean-PeaK Gradient: 19 / 36 mmHg). Mild MR. Mod LA dilation. PAP ~40 mmHg -> plan follow-up 2020.  [name] Kitchen TRANSTHORACIC ECHOCARDIOGRAM  01/2018   Lisa LV size.  Moderate concentric LVH.  EF 60 to 65%.  No R WMA.  GR 1 DD (high filling pressures).  Mild AS (mean gradient 16 mmHg) with moderate  AI.  Severe LA dilation.  Mild MR.    Family History  Problem Relation Age of Onset  . Arthritis Mother   . Hyperlipidemia Mother   . Diabetes Mother   . Gout Mother   . Hypertension Mother   . Cancer Father        GI cancer  . Diabetes Maternal Grandfather   . Hypertension Brother   . Diabetes Brother   . Hypertension Brother     Social History   Socioeconomic History  . Marital status: Married    Spouse name: Herbie Baltimore  . Number of children: 2  . Years of education: Not on file  . Highest education level: Not on file  Occupational History  . Occupation: production    Employer: [employer]    Comment: check printing/shipping  Tobacco Use  . Smoking  status: Never Smoker  . Smokeless tobacco: Never Used  Substance and Sexual Activity  . Alcohol use: No  . Drug use: No  . Sexual activity: Never  Other Topics Concern  . Not on file  Social History Narrative   Lives with her husband.  Their children live nearby.   Social Determinants of Health   Financial Resource Strain:   . Difficulty of Paying Living Expenses:   Food Insecurity:   . Worried About Charity fundraiser in the Last Year:   . Arboriculturist in the Last Year:   Transportation Needs:   . Film/video editor (Medical):   [name] Kitchen Lack of Transportation (Non-Medical):   Physical Activity:   . Days of Exercise per Week:   . Minutes of Exercise per Session:   Stress:   . Feeling of Stress :   Social Connections:   . Frequency of Communication with Friends and Family:   . Frequency of Social Gatherings with Friends and Family:   . Attends Religious Services:   . Active Member of Clubs or Organizations:   . Attends Archivist Meetings:   [name] Kitchen Marital Status:   Intimate Partner Violence:   . Fear of Current or Ex-Partner:   . Emotionally Abused:   [name] Kitchen Physically Abused:   . Sexually Abused:     Outpatient Medications Prior to Visit  Medication Sig Dispense Refill  . aspirin 81 MG tablet Take by mouth.    . calcium-vitamin D (OSCAL WITH D) 250-125 MG-UNIT tablet Take 1 tablet by mouth daily.     . Cholecalciferol (VITAMIN D-3) 1000 UNITS CAPS Take 1 capsule by mouth daily.    [name] Kitchen estradiol (ESTRACE) 2 MG tablet Take 2 mg by mouth daily.    . valsartan (DIOVAN) 320 MG tablet Take 1 tablet by mouth once daily 90 tablet 0  . albuterol (PROVENTIL HFA;VENTOLIN HFA) 108 (90 Base) MCG/ACT inhaler Inhale 2 puffs every 6 (six) hours as needed into the lungs. (Patient not taking: Reported on 11/07/2018) 1 Inhaler 1  . amoxicillin (AMOXIL) 500 MG tablet Take 4 tablets (2,000 mg total) by mouth once as needed for up to 1 dose. Take prior to dental procedures (Patient not  taking: Reported on 07/05/2019) 8 tablet 0  . ibuprofen (ADVIL,MOTRIN) 600 MG tablet Take 1 tablet (600 mg total) by mouth every 8 (eight) hours as needed. (Patient not taking: Reported on 08/02/2019) 30 tablet 0   No facility-administered medications prior to visit.    No Known Allergies  ROS Review of Systems Review of Systems  Constitutional: Negative for activity change, appetite change, chills and fever.  HENT: Negative for congestion, nosebleeds, trouble swallowing and voice change.   Respiratory: Negative for cough, shortness of breath and wheezing.   Gastrointestinal: Negative for diarrhea, nausea and vomiting.  Genitourinary: Negative for difficulty urinating, dysuria, flank pain and hematuria.  Musculoskeletal: Negative for back pain, joint swelling and neck pain.  Neurological: Negative for dizziness, speech difficulty, light-headedness and numbness.  See HPI. All other review of systems negative.     Objective:    Physical Exam  BP 116/62 (BP Location: Left Arm, Patient Position: Sitting, Cuff Size: Large)   Pulse 63   Temp (!) 97.5 F (36.4 C) (Temporal)   Resp 17   Ht 5\' 5"  (1.651 m)   Wt 219 lb (99.3 kg)   SpO2 99%   BMI 36.44 kg/m  Wt Readings from Last 3 Encounters:  08/02/19 219 lb (99.3 kg)  11/07/18 227 lb (103 kg)  09/07/18 228 lb (103.4 kg)   Physical Exam  Constitutional: Oriented to [place], place, and time. Appears well-developed and well-nourished.  HENT:  Head: Normocephalic and atraumatic.  Eyes: Conjunctivae and EOM are Lisa.  Cardiovascular: Lisa rate, regular rhythm, Lisa heart sounds and intact distal pulses.  No murmur heard. Pulmonary/Chest: Effort Lisa and breath sounds Lisa. No stridor. No respiratory distress. Has no wheezes. Abdomen: non-distended, normoactive bs, soft, nont-tender  Neurological: Is alert and oriented to [place], place, and time.  Skin: Skin is warm. Capillary refill takes less than 2 seconds.  Psychiatric:  Has a Lisa mood and affect. Behavior is Lisa. Judgment and thought content Lisa.    There are no preventive care reminders to display for this patient.  There are no preventive care reminders to display for this patient.  Lab Results  Component Value Date   TSH 3.160 07/22/2016   Lab Results  Component Value Date   WBC 4.4 06/09/2017   HGB 13.1 06/09/2017   HCT 38.5 06/09/2017   MCV 87.9 06/09/2017   PLT 289.0 06/09/2017   Lab Results  Component Value Date   NA 136 07/06/2018   K 4.7 07/06/2018   CO2 22 07/06/2018   GLUCOSE 86 07/06/2018   BUN 14 07/06/2018   CREATININE 0.80 07/06/2018   BILITOT <0.2 07/06/2018   ALKPHOS 46 07/06/2018   AST 16 07/06/2018   ALT 11 07/06/2018   PROT 6.4 07/06/2018   ALBUMIN 3.8 07/06/2018   CALCIUM 9.2 07/06/2018   ANIONGAP 14 03/29/2014   GFR 98.74 05/23/2014   Lab Results  Component Value Date   CHOL 180 07/06/2018   Lab Results  Component Value Date   HDL 68 07/06/2018   Lab Results  Component Value Date   LDLCALC 91 07/06/2018   Lab Results  Component Value Date   TRIG 104 07/06/2018   Lab Results  Component Value Date   CHOLHDL 2.6 07/06/2018   Lab Results  Component Value Date   HGBA1C 4.6 08/02/2019      Assessment & Plan:   Problem List Items Addressed This Visit         Hypertension, essential - Patient's blood pressure is at goal of 139/89 or less. Condition is stable. Continue current medications and treatment plan. I recommend that you exercise for 30-45 minutes 5 days a week. I also recommend a balanced diet with fruits and vegetables every day, lean meats, and little fried foods. The DASH diet (you can find this online) is a good example of this.  Cough   Relevant Orders   Ambulatory referral to  Gastroenterology   Chronic throat clearing   Relevant Orders   Ambulatory referral to Gastroenterology   Urinary frequency - Primary No uti, this is probably stress from coughing   Relevant  Orders   POCT urinalysis dipstick (Completed)    Other Visit Diagnoses    Prediabetes    - now Lisa, will resolve this issue   Relevant Orders   POCT glycosylated hemoglobin (Hb A1C) (Completed)   Globus sensation    -  Referral to GI placed, added nexium, advised small meals.    Relevant Orders   Ambulatory referral to Gastroenterology      Meds ordered this encounter  Medications  . esomeprazole (NEXIUM) 20 MG capsule    Sig: Take 1 capsule (20 mg total) by mouth 2 (two) times daily before a meal.    Dispense:  60 capsule    Refill:  1    Follow-up: Return in about 2 months (around 10/02/2019) for chronic cough and throat clearing and follow up on GI visit with Maximiano Coss.   A total of 25 minutes were spent face-to-face with the patient during this encounter and over half of that time was spent on counseling and coordination of care.  Forrest Moron, MD

## 2019-08-02 NOTE — Telephone Encounter (Signed)
Patient is requesting a refill of the following medications: Requested Prescriptions   Pending Prescriptions Disp Refills  . esomeprazole (NEXIUM) 20 MG capsule [Pharmacy Med Name: ESOMEPRAZOLE MAGNESIUM 20MG  DR CAPS] 180 capsule     Sig: TAKE 1 CAPSULE(20 MG) BY MOUTH TWICE DAILY BEFORE A MEAL    Date of patient request: 08/02/2019 Last office visit: 08/02/2019 Date of last refill: 08/02/2019 Last refill amount: 60 capsules 1 refilled Follow up time period per chart:follow up appointment scheduled   Patient would like a 90 day supply instead of 60. Please Advise.

## 2019-08-07 ENCOUNTER — Other Ambulatory Visit: Payer: Self-pay | Admitting: Family Medicine

## 2019-08-07 DIAGNOSIS — I10 Essential (primary) hypertension: Secondary | ICD-10-CM

## 2019-08-08 NOTE — Progress Notes (Signed)
[id number]    Provider:  Dr Jaynee Eagles Requesting Provider: Forrest Moron, MD Primary Care Provider:  Forrest Moron, MD  CC:  Morning headache  HPI:  Lisa Miles is a 69 y.o. female here as requested by Forrest Moron, MD for frequent headaches.  Past medical history scoliosis, palpitations, obesity, hypertension, hyperlipidemia, heart disease, asthma and aortic stenosis,arthritis. I reviewed Delia Chimes notes, patient with headache 6 days a week, went to eye doctor to get new prescription. I reviewed back to 2018 and the only other mention of headaches I saw was in 2018, "generalized headaches", no other information seen in chart, at that time a CT maxillofacial was ordered to eval for sinus disease causing headaches which showed air fluid levels mxillary sinuses that are suggestive of acute sinusitis (personally reviewed images) and possibly a dental infection. No brain imaging was seen.  Headaches started last year, late last year, she wakes up with a headache every day. She coughs all the time and this makes the headache worse. She feels fatigued later in the day, she doesn't nap but she goes to bed late, her right nostrl is blocked, she says flat to sleep or on her sides, the headaches are in the back of the eyes, and in the head, lurry vision, no history of migraines, no light/sound sensitivity, no autonomic symptoms, worse in the mornings supine, may improve during the day and sometimes it is not as bad but sometimes it is bad, progressive, worsening. She has a lot of gerd problems and coughing and is worsening. No weakness but sometimes when stands feels a little imbalanced. She also forgets a lot. No inciting events, she has blurry vision.pulsingin the ear.  No other focal neurologic deficits, associated symptoms, inciting events or modifiable factors.  Reviewed notes, labs and imaging from outside physicians, which showed:  meds tried include lisinopril,  meloicam, robaxin, prednisone, tramadol, valsartan  Review of Systems: Patient complains of symptoms per HPI as well as the following symptoms: headaches, fatigue. Pertinent negatives and positives per HPI. All others negative.   Social History   Socioeconomic History  . Marital status: Married    Spouse name: Herbie Baltimore  . Number of children: 2  . Years of education: Not on file  . Highest education level: Not on file  Occupational History  . Occupation: production    Employer: [employer]    Comment: check printing/shipping  Tobacco Use  . Smoking status: Never Smoker  . Smokeless tobacco: Never Used  Substance and Sexual Activity  . Alcohol use: No  . Drug use: No  . Sexual activity: Never  Other Topics Concern  . Not on file  Social History Narrative   Lives with her husband.  Their children live nearby.   Right handed   Caffeine: 1 cup/day   Social Determinants of Health   Financial Resource Strain:   . Difficulty of Paying Living Expenses:   Food Insecurity:   . Worried About Charity fundraiser in the Last Year:   . Arboriculturist in the Last Year:   Transportation Needs:   . Film/video editor (Medical):   [name] Kitchen Lack of Transportation (Non-Medical):   Physical Activity:   . Days of Exercise per Week:   . Minutes of Exercise per Session:   Stress:   . Feeling of Stress :   Social Connections:   . Frequency of Communication with Friends and Family:   . Frequency of Social Gatherings  with Friends and Family:   . Attends Religious Services:   . Active Member of Clubs or Organizations:   . Attends Archivist Meetings:   [name] Kitchen Marital Status:   Intimate Partner Violence:   . Fear of Current or Ex-Partner:   . Emotionally Abused:   [name] Kitchen Physically Abused:   . Sexually Abused:     Family History  Problem Relation Age of Onset  . Arthritis Mother   . Hyperlipidemia Mother   . Diabetes Mother   . Gout Mother   . Hypertension Mother   . Cancer  Father        GI cancer  . Diabetes Maternal Grandfather   . Hypertension Brother   . Diabetes Brother   . Hypertension Brother   . Migraines Neg Hx   . Headache Neg Hx     Past Medical History:  Diagnosis Date  . Aortic stenosis, mild 07/2012   Mild AS with Mod MR -(Mean/Peak Gradient 20 mmHg/34 mmHg).  . Asthma   . Bilateral bunions    Bunionectomies performed  . Bronchitis, chronic ([place])   . Heart disease   . Hyperlipidemia   . Hypertension    Good control  . Inguinodynia    Bilateral groin pain  . Lower extremity edema    Chronic. Venous Duplex 11/06/11 SUMMARY: 1) Bilateral Lower Extremities: No evidence of DVT or thrombophlebitis.  2) Right Common Femoral Vein: Demonstrated mild valvular insufficiency with a greater than (1) sec of duration. Mildly abnormal LE Venous duplex Doppler.  . Mitral regurgitation 07/2012   Mild AMVL prolapse, Mod MR -- no MVP noted in 01/2018  . Obesity   . Palpitations    Relatively well controlled  . Scoliosis    DG Chest 2 View x-ray on 07/30/11 by Dr. Everlene Farrier shows a scoliosis.    Patient Active Problem List   Diagnosis Date Noted  . Primary osteoarthritis involving multiple joints 04/01/2018  . Chronic throat clearing 04/01/2018  . Urinary frequency 04/01/2018  . OAB (overactive bladder) 04/01/2018  . Cough 09/02/2017  . Visual changes 10/14/2016  . Menopause 08/20/2015  . Fungal dermatitis 05/23/2014  . Heart palpitations 10/19/2013  . Thyromegaly 03/28/2013  . Routine general medical examination at a health care facility 03/28/2013  . Obesity (BMI 30-39.9) 03/28/2013  . Lower extremity edema   . Inguinodynia 12/23/2012  . Essential hypertension 07/14/2012  . Bilateral groin pain 07/14/2012  . Dyslipidemia 05/30/2009  . Mild mitral insufficiency 05/30/2009  . Aortic valve disorder 05/30/2009  . ASTHMA UNSPECIFIED WITH EXACERBATION 04/02/2009    Past Surgical History:  Procedure Laterality Date  . ABDOMINAL HYSTERECTOMY      . ANKLE ARTHROSCOPY Left   . BUNIONECTOMY Bilateral   . KNEE ARTHROSCOPY Right   . Left knee open surgery     pt thinks she only had shots in L knee, not surgery  . TOTAL ABDOMINAL HYSTERECTOMY    . TRANSTHORACIC ECHOCARDIOGRAM  07/2012; 01/2013   a) 4/'14: mild-mod AS, Mod AI (mean/peak gradient: 18 mmHg / 31 mmhg).  Mild MVP w/ Mod MR, no MS.  Mildly elevated PAP (~37 mmHg) w/ Mod TR. EF 55-60%. GR 1 DD.;; b). 01/2013: EF 60-65%. No RWMA. Gr  DD. Mild AS (M / P Gradient 20 mmHg/34 mmHg). Mild AMVL Prolapse w/ Mod MR. Mod LA dilation. Mod TR.  [name] Kitchen TRANSTHORACIC ECHOCARDIOGRAM  01/2015; 01/2016   a) Normal LV size and function. GR 1 DD. Moderate LA Dilation. Calcified aortic valve with  mild AS, moderate (2+) AI. Restricted posterior MV leaflet movement w/ Mod MR. Mode TR with mod elevated PA pressures (45 mmHg);; b) Relatively stable. Moderate concentric LVH. Moderate AI with mild AS (Mean-PeaK Gradient: 19 / 36 mmHg). Mild MR. Mod LA dilation. PAP ~40 mmHg -> plan follow-up 2020.  [name] Kitchen TRANSTHORACIC ECHOCARDIOGRAM  01/2018   Normal LV size.  Moderate concentric LVH.  EF 60 to 65%.  No R WMA.  GR 1 DD (high filling pressures).  Mild AS (mean gradient 16 mmHg) with moderate AI.  Severe LA dilation.  Mild MR.    Current Outpatient Medications  Medication Sig Dispense Refill  . aspirin 81 MG tablet Take 81 mg by mouth daily.     . calcium-vitamin D (OSCAL WITH D) 250-125 MG-UNIT tablet Take 1 tablet by mouth daily.     . Cholecalciferol (VITAMIN D-3) 1000 UNITS CAPS Take 1 capsule by mouth daily.    [name] Kitchen esomeprazole (NEXIUM) 20 MG capsule Take 1 capsule (20 mg total) by mouth 2 (two) times daily before a meal. 60 capsule 1  . estradiol (ESTRACE) 2 MG tablet Take 2 mg by mouth daily.    . valsartan (DIOVAN) 320 MG tablet Take 1 tablet by mouth once daily 90 tablet 0   No current facility-administered medications for this visit.    Allergies as of 08/09/2019  . (No Known Allergies)    Vitals: BP  130/77 (BP Location: Left Arm, Patient Position: Sitting)   Pulse 80   Temp (!) 97.2 F (36.2 C) Comment: taken at front  Ht 5\' 6"  (1.676 m)   Wt 217 lb (98.4 kg)   BMI 35.02 kg/m  Last Weight:  Wt Readings from Last 1 Encounters:  08/09/19 217 lb (98.4 kg)   Last Height:   Ht Readings from Last 1 Encounters:  08/09/19 5\' 6"  (1.676 m)     Physical exam: Exam: Gen: NAD, conversant, well nourised, obese, well groomed                     CV: RRR, no MRG. No Carotid Bruits. No peripheral edema, warm, nontender Eyes: Conjunctivae clear without exudates or hemorrhage  Neuro: Detailed Neurologic Exam  Speech:    Speech is normal; fluent and spontaneous with normal comprehension.  Cognition:    The patient is oriented to [place], place, and time;     recent and remote memory intact;     language fluent;     normal attention, concentration,     fund of knowledge Cranial Nerves:    The pupils are equal, round, and reactive to light. The fundi are normal and spontaneous venous pulsations are present. Visual fields are full to finger confrontation. Extraocular movements are intact. Trigeminal sensation is intact and the muscles of mastication are normal. The face is symmetric. The palate elevates in the midline. Hearing intact. Voice is normal. Shoulder shrug is normal. The tongue has normal motion without fasciculations.   Coordination:    No dysmetria   Gait:    Slightly antalgic but overal normal native gait.   Motor Observation:    No asymmetry, no atrophy, and no involuntary movements noted. Tone:    Normal muscle tone.    Posture:    Posture is normal. normal erect    Strength:    Strength is V/V in the upper and lower limbs.      Sensation: intact to LT     Reflex Exam:  DTR's:  Deep tendon reflexes in the upper and lower extremities are 1+ bilaterally.   Toes:    The toes are equiv bilaterally.   Clonus:    Clonus is absent.    Assessment/Plan:  69 y.o.  female here as requested by Forrest Moron, MD for frequent headaches.  Past medical history scoliosis, palpitations, obesity, hypertension, hyperlipidemia, heart disease, asthma and aortic stenosis,arthritis.  She has exertional and supine headaches needs evaluation: MRI brain due to concerning symptoms of morning headaches, positional headaches,vision changes  to look for space occupying mass, chiari or intracranial hypertension (pseudotumor). Also needs MRA to evaluate for aneurhytm due to exertional headaches worsen with cough.  - Wake ups up daily with morning headaches, memory loss, some fatigue later in the day, coughing and gerd, morning headaches worse in the morning and headache improves during thel day. She has no history of migraines, headaches started last last year, no migrainous features, no autonomic features feel she needs a sleep evaluation for sleep apnea or hypoventiltion/hypoxia causing morning headaches.   Orders Placed This Encounter  Procedures  . MR BRAIN W WO CONTRAST  . MR ANGIO HEAD WO CONTRAST  . CBC  . Comprehensive metabolic panel  . TSH  . Ambulatory referral to Sleep Studies     Cc: Forrest Moron, MD,    Sarina Ill, MD  [facility] [address] [place], [place] [address]  Phone [phone] Fax [phone]  I spent 60 minutes of face-to-face and non-face-to-face time with patient on the  1. Morning headache   2. Memory loss   3. Cough headache   4. Exertional headache   5. Daily headache   6. Worsening headaches   7. New onset of headaches after age 55   8. Positional headache   9. Blurry vision   10. Pulsatile tinnitus    diagnosis.  This included previsit chart review, lab review, study review, order entry, electronic health record documentation, patient education on the different diagnostic and therapeutic options, counseling and coordination of care, risks and benefits of management, compliance, or  risk factor reduction

## 2019-08-09 ENCOUNTER — Ambulatory Visit (INDEPENDENT_AMBULATORY_CARE_PROVIDER_SITE_OTHER): Payer: PPO | Admitting: Neurology

## 2019-08-09 ENCOUNTER — Encounter: Payer: Self-pay | Admitting: Neurology

## 2019-08-09 ENCOUNTER — Other Ambulatory Visit: Payer: Self-pay

## 2019-08-09 ENCOUNTER — Telehealth: Payer: Self-pay | Admitting: Neurology

## 2019-08-09 VITALS — BP 130/77 | HR 80 | Temp 97.2°F | Ht 66.0 in | Wt 217.0 lb

## 2019-08-09 DIAGNOSIS — H93A9 Pulsatile tinnitus, unspecified ear: Secondary | ICD-10-CM

## 2019-08-09 DIAGNOSIS — G4484 Primary exertional headache: Secondary | ICD-10-CM | POA: Diagnosis not present

## 2019-08-09 DIAGNOSIS — R413 Other amnesia: Secondary | ICD-10-CM

## 2019-08-09 DIAGNOSIS — R519 Headache, unspecified: Secondary | ICD-10-CM

## 2019-08-09 DIAGNOSIS — R51 Headache with orthostatic component, not elsewhere classified: Secondary | ICD-10-CM | POA: Diagnosis not present

## 2019-08-09 DIAGNOSIS — H538 Other visual disturbances: Secondary | ICD-10-CM | POA: Diagnosis not present

## 2019-08-09 DIAGNOSIS — G4483 Primary cough headache: Secondary | ICD-10-CM

## 2019-08-09 NOTE — Patient Instructions (Signed)
MRI of the brain and blood vessels of the head Sleep evaluation   Sleep Apnea Sleep apnea is a condition in which breathing pauses or becomes shallow during sleep. Episodes of sleep apnea usually last 10 seconds or longer, and they may occur as many as 20 times an hour. Sleep apnea disrupts your sleep and keeps your body from getting the rest that it needs. This condition can increase your risk of certain health problems, including:  Heart attack.  Stroke.  Obesity.  Diabetes.  Heart failure.  Irregular heartbeat. What are the causes? There are three kinds of sleep apnea:  Obstructive sleep apnea. This kind is caused by a blocked or collapsed airway.  Central sleep apnea. This kind happens when the part of the brain that controls breathing does not send the correct signals to the muscles that control breathing.  Mixed sleep apnea. This is a combination of obstructive and central sleep apnea. The most common cause of this condition is a collapsed or blocked airway. An airway can collapse or become blocked if:  Your throat muscles are abnormally relaxed.  Your tongue and tonsils are larger than normal.  You are overweight.  Your airway is smaller than normal. What increases the risk? You are more likely to develop this condition if you:  Are overweight.  Smoke.  Have a smaller than normal airway.  Are elderly.  Are female.  Drink alcohol.  Take sedatives or tranquilizers.  Have a family history of sleep apnea. What are the signs or symptoms? Symptoms of this condition include:  Trouble staying asleep.  Daytime sleepiness and tiredness.  Irritability.  Loud snoring.  Morning headaches.  Trouble concentrating.  Forgetfulness.  Decreased interest in sex.  Unexplained sleepiness.  Mood swings.  Personality changes.  Feelings of depression.  Waking up often during the night to urinate.  Dry mouth.  Sore throat. How is this diagnosed? This  condition may be diagnosed with:  A medical history.  A physical exam.  A series of tests that are done while you are sleeping (sleep study). These tests are usually done in a sleep lab, but they may also be done at home. How is this treated? Treatment for this condition aims to restore normal breathing and to ease symptoms during sleep. It may involve managing health issues that can affect breathing, such as high blood pressure or obesity. Treatment may include:  Sleeping on your side.  Using a decongestant if you have nasal congestion.  Avoiding the use of depressants, including alcohol, sedatives, and narcotics.  Losing weight if you are overweight.  Making changes to your diet.  Quitting smoking.  Using a device to open your airway while you sleep, such as: ? An oral appliance. This is a custom-made mouthpiece that shifts your lower jaw forward. ? A continuous positive airway pressure (CPAP) device. This device blows air through a mask when you breathe out (exhale). ? A nasal expiratory positive airway pressure (EPAP) device. This device has valves that you put into each nostril. ? A bi-level positive airway pressure (BPAP) device. This device blows air through a mask when you breathe in (inhale) and breathe out (exhale).  Having surgery if other treatments do not work. During surgery, excess tissue is removed to create a wider airway. It is important to get treatment for sleep apnea. Without treatment, this condition can lead to:  High blood pressure.  Coronary artery disease.  In men, an inability to achieve or maintain an erection (impotence).  Reduced thinking abilities. Follow these instructions at home: Lifestyle  Make any lifestyle changes that your health care provider recommends.  Eat a healthy, well-balanced diet.  Take steps to lose weight if you are overweight.  Avoid using depressants, including alcohol, sedatives, and narcotics.  Do not use any  products that contain nicotine or tobacco, such as cigarettes, e-cigarettes, and chewing tobacco. If you need help quitting, ask your health care provider. General instructions  Take over-the-counter and prescription medicines only as told by your health care provider.  If you were given a device to open your airway while you sleep, use it only as told by your health care provider.  If you are having surgery, make sure to tell your health care provider you have sleep apnea. You may need to bring your device with you.  Keep all follow-up visits as told by your health care provider. This is important. Contact a health care provider if:  The device that you received to open your airway during sleep is uncomfortable or does not seem to be working.  Your symptoms do not improve.  Your symptoms get worse. Get help right away if:  You develop: ? Chest pain. ? Shortness of breath. ? Discomfort in your back, arms, or stomach.  You have: ? Trouble speaking. ? Weakness on one side of your body. ? Drooping in your face. These symptoms may represent a serious problem that is an emergency. Do not wait to see if the symptoms will go away. Get medical help right away. Call your local emergency services (911 in the [place] Do not drive yourself to the hospital. Summary  Sleep apnea is a condition in which breathing pauses or becomes shallow during sleep.  The most common cause is a collapsed or blocked airway.  The goal of treatment is to restore normal breathing and to ease symptoms during sleep. This information is not intended to replace advice given to you by your health care provider. Make sure you discuss any questions you have with your health care provider. Document Revised: 09/28/2018 Document Reviewed: 12/07/2017 Elsevier Patient Education  [employer].

## 2019-08-09 NOTE — Telephone Encounter (Signed)
health team order sent to GI. No auth they will reach out to the patient to schedule.  

## 2019-08-10 LAB — CBC
Hematocrit: 36.4 % (ref 34.0–46.6)
Hemoglobin: 12.3 g/dL (ref 11.1–15.9)
MCH: 29.9 pg (ref 26.6–33.0)
MCHC: 33.8 g/dL (ref 31.5–35.7)
MCV: 89 fL (ref 79–97)
Platelets: 358 x10E3/uL (ref 150–450)
RBC: 4.11 x10E6/uL (ref 3.77–5.28)
RDW: 12.3 % (ref 11.7–15.4)
WBC: 4.3 x10E3/uL (ref 3.4–10.8)

## 2019-08-10 LAB — COMPREHENSIVE METABOLIC PANEL WITH GFR
ALT: 19 IU/L (ref 0–32)
AST: 18 IU/L (ref 0–40)
Albumin/Globulin Ratio: 1.1 — ABNORMAL LOW (ref 1.2–2.2)
Albumin: 3.5 g/dL — ABNORMAL LOW (ref 3.8–4.8)
Alkaline Phosphatase: 53 IU/L (ref 39–117)
BUN/Creatinine Ratio: 9 — ABNORMAL LOW (ref 12–28)
BUN: 8 mg/dL (ref 8–27)
Bilirubin Total: 0.2 mg/dL (ref 0.0–1.2)
CO2: 23 mmol/L (ref 20–29)
Calcium: 9 mg/dL (ref 8.7–10.3)
Chloride: 101 mmol/L (ref 96–106)
Creatinine, Ser: 0.91 mg/dL (ref 0.57–1.00)
GFR calc Af Amer: 75 mL/min/1.73
GFR calc non Af Amer: 65 mL/min/1.73
Globulin, Total: 3.1 g/dL (ref 1.5–4.5)
Glucose: 100 mg/dL — ABNORMAL HIGH (ref 65–99)
Potassium: 4.5 mmol/L (ref 3.5–5.2)
Sodium: 135 mmol/L (ref 134–144)
Total Protein: 6.6 g/dL (ref 6.0–8.5)

## 2019-08-10 LAB — TSH: TSH: 1.77 u[IU]/mL (ref 0.450–4.500)

## 2019-08-21 DIAGNOSIS — Z01419 Encounter for gynecological examination (general) (routine) without abnormal findings: Secondary | ICD-10-CM | POA: Diagnosis not present

## 2019-08-21 DIAGNOSIS — Z6837 Body mass index (BMI) 37.0-37.9, adult: Secondary | ICD-10-CM | POA: Diagnosis not present

## 2019-08-24 ENCOUNTER — Ambulatory Visit (INDEPENDENT_AMBULATORY_CARE_PROVIDER_SITE_OTHER): Payer: PPO | Admitting: Neurology

## 2019-08-24 ENCOUNTER — Other Ambulatory Visit: Payer: Self-pay

## 2019-08-24 ENCOUNTER — Encounter: Payer: Self-pay | Admitting: Neurology

## 2019-08-24 VITALS — BP 125/71 | HR 67 | Temp 98.3°F | Ht 66.0 in | Wt 216.0 lb

## 2019-08-24 DIAGNOSIS — G478 Other sleep disorders: Secondary | ICD-10-CM

## 2019-08-24 DIAGNOSIS — R002 Palpitations: Secondary | ICD-10-CM | POA: Diagnosis not present

## 2019-08-24 DIAGNOSIS — I359 Nonrheumatic aortic valve disorder, unspecified: Secondary | ICD-10-CM

## 2019-08-24 DIAGNOSIS — I340 Nonrheumatic mitral (valve) insufficiency: Secondary | ICD-10-CM | POA: Diagnosis not present

## 2019-08-24 DIAGNOSIS — Z72821 Inadequate sleep hygiene: Secondary | ICD-10-CM | POA: Insufficient documentation

## 2019-08-24 DIAGNOSIS — Z87898 Personal history of other specified conditions: Secondary | ICD-10-CM | POA: Diagnosis not present

## 2019-08-24 DIAGNOSIS — E669 Obesity, unspecified: Secondary | ICD-10-CM

## 2019-08-24 MED ORDER — MELATONIN 1 MG/4ML PO LIQD: 4.0000 mL | Freq: Every day | ORAL | 0 refills | Status: DC

## 2019-08-24 NOTE — Progress Notes (Signed)
SLEEP MEDICINE CLINIC    Provider:  Larey Seat, MD  Primary Care Physician:  [name]     Referring Provider: Dr Jaynee Eagles, MD       Chief Complaint according to patient   Patient presents with:    . New Patient (Initial Visit)     Referral by Dr Jaynee Eagles, MD for morning headaches.  presents today because she wakes up with morning headaches. Dr Jaynee Eagles referred to assess if OSA is reason. pt admits to snoring but unsure of apnea events      HISTORY OF PRESENT ILLNESS:  Lisa Miles is a 69 y.o. year-old [place] American female patient and seen here upon Dr. Cathren Laine  a referral on 08/24/2019    Lisa Miles 4/29.2021, a right -handed African American female with a possible sleep disorder.  She has a past medical history of Aortic stenosis, mild (07/2012), Asthma, Bilateral bunions, Bronchitis, chronic (HCC), Heart murmur - aortic valve disease, Hyperlipidemia, Hypertension, Mitral regurgitation (07/2012), Obesity, Palpitations, and Thoracolumbal Scoliosis.     Sleep relevant medical history: Nocturia 2-3 times, incontinence - urge and stress. Right nasion restricted due to deviated septum -snoring, morning headaches. HTN in AM. GERD or pharyngeal reflux, phlegm.  Coughing.   Family medical /sleep history: no other family member with OSA, not insomnia, no sleep walkers.    Social history:  Patient is retired from Buyer, retail, paper dust,  and lives in a household with 3 persons/ she lives with spouse, daughter and grandson. Family status is married. Pets are present. Her grandson has a hamster.  Tobacco use: none .  ETOH use ; not in years, Caffeine intake in form of Coffee( in AM ) Soda( coke  1/week ) Tea ( hot tea when stuffy ) No energy drinks. Regular exercise in form of walking .   Hobbies : following news.     Sleep habits are as follows: The patient's dinner time is between 5-6 PM. The patient goes to bed at 9 PM in a  cool, not quiet and dark room- the TV is on - and goes easily to sleep at 3 AM and she works on her computer- takes her lab top to bed. Once asleep, she continues to sleep for 2-3 hours, wakes for the first of severeal bathroom breaks, the first time at 6  AM.   The preferred sleep position is her sides and back, with the support of 2-3 pillows. Dreams are reportedly infrequent. 9.30 AM is the usual rise time. The patient wakes up with an alarm, doesn't use the snooze function. The patient averages less than 6 hours of sleep. .  She reports not feeling refreshed or restored in AM, with symptoms such as dry mouth, morning headaches ( dull , throbbing- retro-orbital pressure, radiating to the top ) , and residual fatigue. Naps are taken frequently, not scheduled.     Review of Systems: Out of a complete 14 system review, the patient complains of only the following symptoms, and all other reviewed systems are negative.:  Fatigue, sleepiness , snoring, fragmented sleep, prolonged sleep phase.   How likely are you to doze in the following situations: 0 = not likely, 1 = slight chance, 2 = moderate chance, 3 = high chance   Sitting and Reading? Watching Television? Sitting inactive in a public place (theater or meeting)? As a passenger in a car for an hour without a break? Lying down  in the afternoon when circumstances permit? Sitting and talking to someone? Sitting quietly after lunch without alcohol? In a car, while stopped for a few minutes in traffic?   Total = 10/ 24 points   FSS endorsed at 48/ 63 points.  DS at 5/ 15 points.   Social History   Socioeconomic History  . Marital status: Married    Spouse name: Herbie Baltimore  . Number of children: 2  . Years of education: Not on file  . Highest education level: Not on file  Occupational History  . Occupation: production    Employer: [employer]    Comment: check printing/shipping  Tobacco Use  . Smoking status: Never Smoker  .  Smokeless tobacco: Never Used  Substance and Sexual Activity  . Alcohol use: No  . Drug use: No  . Sexual activity: Never  Other Topics Concern  . Not on file  Social History Narrative   Lives with her husband.  Their children live nearby.   Right handed   Caffeine: 1 cup/day   Social Determinants of Health   Financial Resource Strain:   . Difficulty of Paying Living Expenses:   Food Insecurity:   . Worried About Charity fundraiser in the Last Year:   . Arboriculturist in the Last Year:   Transportation Needs:   . Film/video editor (Medical):   [name] Kitchen Lack of Transportation (Non-Medical):   Physical Activity:   . Days of Exercise per Week:   . Minutes of Exercise per Session:   Stress:   . Feeling of Stress :   Social Connections:   . Frequency of Communication with Friends and Family:   . Frequency of Social Gatherings with Friends and Family:   . Attends Religious Services:   . Active Member of Clubs or Organizations:   . Attends Archivist Meetings:   [name] Kitchen Marital Status:     Family History  Problem Relation Age of Onset  . Arthritis Mother   . Hyperlipidemia Mother   . Diabetes Mother   . Gout Mother   . Hypertension Mother   . Cancer Father        GI cancer  . Diabetes Maternal Grandfather   . Hypertension Brother   . Diabetes Brother   . Hypertension Brother   . Migraines Neg Hx   . Headache Neg Hx     Past Medical History:  Diagnosis Date  . Aortic stenosis, mild 07/2012   Mild AS with Mod MR -(Mean/Peak Gradient 20 mmHg/34 mmHg).  . Asthma   . Bilateral bunions    Bunionectomies performed  . Bronchitis, chronic ([place])   . Heart disease   . Hyperlipidemia   . Hypertension    Good control  . Inguinodynia    Bilateral groin pain  . Lower extremity edema    Chronic. Venous Duplex 11/06/11 SUMMARY: 1) Bilateral Lower Extremities: No evidence of DVT or thrombophlebitis.  2) Right Common Femoral Vein: Demonstrated mild valvular insufficiency  with a greater than (1) sec of duration. Mildly abnormal LE Venous duplex Doppler.  . Mitral regurgitation 07/2012   Mild AMVL prolapse, Mod MR -- no MVP noted in 01/2018  . Obesity   . Palpitations    Relatively well controlled  . Scoliosis    DG Chest 2 View x-ray on 07/30/11 by Dr. Everlene Farrier shows a scoliosis.    Past Surgical History:  Procedure Laterality Date  . ABDOMINAL HYSTERECTOMY    . ANKLE ARTHROSCOPY Left   .  BUNIONECTOMY Bilateral   . KNEE ARTHROSCOPY Right   . Left knee open surgery     pt thinks she only had shots in L knee, not surgery  . TOTAL ABDOMINAL HYSTERECTOMY    . TRANSTHORACIC ECHOCARDIOGRAM  07/2012; 01/2013   a) 4/'14: mild-mod AS, Mod AI (mean/peak gradient: 18 mmHg / 31 mmhg).  Mild MVP w/ Mod MR, no MS.  Mildly elevated PAP (~37 mmHg) w/ Mod TR. EF 55-60%. GR 1 DD.;; b). 01/2013: EF 60-65%. No RWMA. Gr  DD. Mild AS (M / P Gradient 20 mmHg/34 mmHg). Mild AMVL Prolapse w/ Mod MR. Mod LA dilation. Mod TR.  [name] Kitchen TRANSTHORACIC ECHOCARDIOGRAM  01/2015; 01/2016   a) Normal LV size and function. GR 1 DD. Moderate LA Dilation. Calcified aortic valve with mild AS, moderate (2+) AI. Restricted posterior MV leaflet movement w/ Mod MR. Mode TR with mod elevated PA pressures (45 mmHg);; b) Relatively stable. Moderate concentric LVH. Moderate AI with mild AS (Mean-PeaK Gradient: 19 / 36 mmHg). Mild MR. Mod LA dilation. PAP ~40 mmHg -> plan follow-up 2020.  [name] Kitchen TRANSTHORACIC ECHOCARDIOGRAM  Dr. Ellyn Hack - [place]   01/2018   Normal LV size.  Moderate concentric LVH.  EF 60 to 65%.  No R WMA.  GR 1 DD (high filling pressures).  Mild AS (mean gradient 16 mmHg) with moderate AI.  Severe LA dilation.  Mild MR.     Current Outpatient Medications on File Prior to Visit  Medication Sig Dispense Refill  . aspirin 81 MG tablet Take 81 mg by mouth daily.     . calcium-vitamin D (OSCAL WITH D) 250-125 MG-UNIT tablet Take 1 tablet by mouth daily.     . Cholecalciferol (VITAMIN D-3) 1000 UNITS  CAPS Take 1 capsule by mouth daily.    [name] Kitchen esomeprazole (NEXIUM) 20 MG capsule Take 1 capsule (20 mg total) by mouth 2 (two) times daily before a meal. 60 capsule 1  . estradiol (ESTRACE) 2 MG tablet Take 2 mg by mouth daily.    . valsartan (DIOVAN) 320 MG tablet Take 1 tablet by mouth once daily 90 tablet 0   No current facility-administered medications on file prior to visit.    No Known Allergies  Physical exam:  Today's Vitals   08/24/19 1301  BP: 125/71  Pulse: 67  Temp: 98.3 F (36.8 C)  Weight: 216 lb (98 kg)  Height: 5\' 6"  (1.676 m)   Body mass index is 34.86 kg/m.   Wt Readings from Last 3 Encounters:  08/24/19 216 lb (98 kg)  08/09/19 217 lb (98.4 kg)  08/02/19 219 lb (99.3 kg)     Ht Readings from Last 3 Encounters:  08/24/19 5\' 6"  (1.676 m)  08/09/19 5\' 6"  (1.676 m)  08/02/19 5\' 5"  (1.651 m)      General: The patient is awake, alert and appears not in acute distress. The patient is well groomed. Head: Normocephalic, atraumatic.  Neck is supple. Mallampati  ,  neck circumference:15.5  inches . Nasal airflow is patent.  Retrognathia is not  seen.  Dental status: dentures Cardiovascular:  Regular rate and cardiac rhythm. Respiratory: Lungs are clear to auscultation.  Skin:  Without evidence of ankle edema, or rash. Trunk: The patient's posture is erect.   Neurologic exam : The patient is awake and alert, oriented to place and time.   Memory subjective described as intact.  Attention span & concentration ability appears normal.  Speech is fluent,  without  dysarthria, dysphonia or  aphasia.  Mood and affect are appropriate.   Cranial nerves: no loss of smell or taste reported - COVID fully vaccinated as of  March 2021/  Pupils are equal and briskly reactive to light. Funduscopic exam deferred.   Extraocular movements in vertical and horizontal planes were intact and without nystagmus.  No Diplopia. Visual fields by finger perimetry are intact. Hearing  was intact to soft voice and tuning fork   Facial sensation intact to fine touch. Facial motor strength is symmetric and tongue and uvula move midline.  Neck ROM : rotation, tilt and flexion extension were normal for age and shoulder shrug was symmetrical.    Motor exam:  Symmetric bulk, tone and ROM.   Normal tone without cog wheeling, symmetric grip strength .   Sensory:  Fine touch, pinprick and vibration were normal.  Proprioception tested in the upper extremities was normal.   Coordination: Rapid alternating movements in the fingers/hands were of normal speed.  The Finger-to-nose maneuver was intact without evidence of ataxia, dysmetria or tremor.   Gait and station: Patient could rise unassisted from a seated position, walked without assistive device.  Stance is of normal width/ base and the patient turned with 3 steps.  Toe and heel walk were deferred.  Deep tendon reflexes: in the upper and lower extremities are symmetric and intact. No clonus.  Babinski response was deferred.     Lisa Miles is an established patient of Dr. Michail Sermon whom she had seen on 08-09-19.  She was referred for headache management and she has some medical conditions that certainly could contribute to her problem has been hypertension hyperlipidemia heart disease in the form of aortic valve stenosis and there is and the mitral valve regurgitation, history of asthma but when she presented to Dr. Jaynee Eagles her headaches were ongoing for about 6 headaches per week and she went in the morning out of bed having a headache also the intensity was low.  It was mostly retro-orbital pressure sometimes radiating up to the corona she did not have associated vision trouble she did not feel dizzy and she did not feel nauseated.  Looking into bright light does not accelerate her headaches.  She had a CT of the maxilla first facial scalp before for sinus disease suggestive of some sinusitis.  The headaches themselves are a  rather new phenomenon that started in the year 2020 and she wakes now up with a headache every day but the headaches do not wake her.  She does have a deviated septum with a left sided nasal airflow constriction, she has a lot of but used to be classified as acid reflux problems sometimes leading to coughing and often leading to phlegm.  There is also some postnasal drip the tickling in the throat.  Risk factors that apnea could be part of this consist of her current body mass index which is just Below 35, but her upper airway anatomy and neck size are in normal range.  I also reviewed her sleep habits and there is definitely room for improvement.  I would like for her to spend time in the bed when she intends to sleep.  Instead of being in bed from 9 PM to 9 AM is barely 6 hours of sleep she should try to eliminate the screen and electronic devices from the bedroom and rather work on her computer and her office or desk space somewhere else, finishing up 30 minutes before she intends to go to bed.  This  way bluelight is less likely to prolong her sleep latency.  It would less likely interfere with melatonin at light.  Especially if the TV is in the bedroom on in the background admitting lights and sounds all night. She is a very modest consumer of caffeine she does not smoke she does not use alcohol, there are very few other tips I can give her but I will give her the [facility] for insomnia and the trouble sleeping booklet today.  I will order a sleep study to screen her for possible apnea which means either home sleep test or an in lab attended sleep study.  To yield enough sleep time in the lab she would have to change her sleep habits to go to bed before midnight and initiate sleep before midnight.  Her sinus disease has always affected the left side, and seems related to nasal obstruction, which can make CPAP use very difficult.    After spending a total time of 40  minutes face to face and additional  time for physical and neurologic examination, review of laboratory studies,  personal review of imaging studies, reports and results of other testing and review of referral information / records as far as provided in visit, I have made my Plan, which  is to proceed with: HST  or PSG.  I would like to thank Dr Jaynee Eagles, MD  for allowing me to meet with and to take care of this pleasant patient.   In short, Lisa Miles is presenting with poor sleep hygiene , needing rules and routines for successful, efficient sleep. I plan to follow up either personally or through our NP within 203  month.   CC: I will share my notes with PCP   Electronically signed by: Larey Seat, MD 08/24/2019 1:13 PM  [facility] Board certified by The [employer] of Sleep Medicine and Diplomate of the [employer] of Sleep Medicine. Board certified In Neurology through the [place], Fellow of the [employer] of Neurology. Medical Director of [employer].

## 2019-08-24 NOTE — Patient Instructions (Signed)
Please remember to try to maintain good sleep hygiene, which means: Keep a regular sleep and wake schedule, try not to exercise or have a meal within 2 hours of your bedtime, try to keep your bedroom conducive for sleep, that is, cool and dark, without light distractors such as an illuminated alarm clock, and refrain from watching TV right before sleep or in the middle of the night. DO not keep the TV or radio on during the night. Also, try not to use or play on electronic devices at bedtime, such as your cell phone, tablet PC or laptop. If you like to read at bedtime on an electronic device, try to dim the background light as much as possible. Do not eat in the middle of the night.   We will request a sleep study. It will likely be a Home Sleep Test.   You may use melatonin to help to sleep quicker.     We will look for hypoxemia  and snoring or sleep apnea.   For chronic insomnia, you are best followed by a psychiatrist and/or sleep psychologist.   We will call you with the sleep study results and make a follow up appointment if needed.

## 2019-09-05 ENCOUNTER — Ambulatory Visit
Admission: RE | Admit: 2019-09-05 | Discharge: 2019-09-05 | Disposition: A | Discharge status: Home / Self Care | Payer: PPO | Source: Ambulatory Visit | Attending: Neurology | Admitting: Neurology

## 2019-09-05 ENCOUNTER — Ambulatory Visit
Admission: RE | Admit: 2019-09-05 | Discharge: 2019-09-05 | Disposition: A | Discharge status: Home / Self Care | Source: Ambulatory Visit | Attending: Neurology | Admitting: Neurology

## 2019-09-05 ENCOUNTER — Other Ambulatory Visit: Payer: Self-pay

## 2019-09-05 DIAGNOSIS — R413 Other amnesia: Secondary | ICD-10-CM

## 2019-09-05 DIAGNOSIS — R51 Headache with orthostatic component, not elsewhere classified: Secondary | ICD-10-CM

## 2019-09-05 DIAGNOSIS — G4484 Primary exertional headache: Secondary | ICD-10-CM

## 2019-09-05 DIAGNOSIS — G4483 Primary cough headache: Secondary | ICD-10-CM

## 2019-09-05 DIAGNOSIS — H538 Other visual disturbances: Secondary | ICD-10-CM

## 2019-09-05 DIAGNOSIS — R519 Headache, unspecified: Secondary | ICD-10-CM

## 2019-09-05 DIAGNOSIS — H93A9 Pulsatile tinnitus, unspecified ear: Secondary | ICD-10-CM

## 2019-09-05 MED ORDER — GADOBENATE DIMEGLUMINE 529 MG/ML IV SOLN
20.0000 mL | Freq: Once | INTRAVENOUS | Status: AC | PRN
  Administered 2019-09-05: 20 mL via INTRAVENOUS

## 2019-09-19 ENCOUNTER — Ambulatory Visit (INDEPENDENT_AMBULATORY_CARE_PROVIDER_SITE_OTHER): Payer: PPO | Admitting: Cardiology

## 2019-09-19 ENCOUNTER — Encounter: Payer: Self-pay | Admitting: Cardiology

## 2019-09-19 ENCOUNTER — Other Ambulatory Visit: Payer: Self-pay

## 2019-09-19 VITALS — BP 128/70 | HR 58 | Temp 97.3°F | Ht 66.0 in | Wt 214.0 lb

## 2019-09-19 DIAGNOSIS — I340 Nonrheumatic mitral (valve) insufficiency: Secondary | ICD-10-CM | POA: Diagnosis not present

## 2019-09-19 DIAGNOSIS — R002 Palpitations: Secondary | ICD-10-CM

## 2019-09-19 DIAGNOSIS — I359 Nonrheumatic aortic valve disorder, unspecified: Secondary | ICD-10-CM | POA: Diagnosis not present

## 2019-09-19 DIAGNOSIS — E669 Obesity, unspecified: Secondary | ICD-10-CM | POA: Diagnosis not present

## 2019-09-19 DIAGNOSIS — R600 Localized edema: Secondary | ICD-10-CM | POA: Diagnosis not present

## 2019-09-19 DIAGNOSIS — E785 Hyperlipidemia, unspecified: Secondary | ICD-10-CM | POA: Diagnosis not present

## 2019-09-19 NOTE — Patient Instructions (Signed)
Medication Instructions:  No chnages *If you need a refill on your cardiac medications before your next appointment, please call your pharmacy*   Lab Work: Not needed I   Testing/Procedures: Will be schedule at [address]- Oct 2021 Your physician has requested that you have an echocardiogram. Echocardiography is a painless test that uses sound waves to create images of your heart. It provides your doctor with information about the size and shape of your heart and how well your heart's chambers and valves are working. This procedure takes approximately one hour. There are no restrictions for this procedure.     Follow-Up: At [facility], you and your health needs are our priority.  As part of our continuing mission to provide you with exceptional heart care, we have created designated Provider Care Teams.  These Care Teams include your primary Cardiologist (physician) and Advanced Practice Providers (APPs -  Physician Assistants and Nurse Practitioners) who all work together to provide you with the care you need, when you need it.    Your next appointment:   12 month(s)  The format for your next appointment:   In [place]  Provider:   Glenetta Hew, MD   Other Instructions

## 2019-09-19 NOTE — Progress Notes (Signed)
Primary Care Provider: Forrest Moron, MD Cardiologist: Glenetta Hew, MD Electrophysiologist: None  Clinic Note: Chief Complaint  Patient presents with  . Follow-up    12 months.  . Cardiac Valve Problem    Mild MR, AS with moderate AR    HPI:    Lisa Miles is a 69 y.o. female with a PMH notable for hypertensive heart disease (moderate LVH) along with moderate aortic insufficiency mild aortic stenosis who presents today for annual follow-up.  Lisa Miles was last seen on on Sep 07, 2018 via telemedicine.  She was doing quite well at that time just having a little bit of allergy related congestion.  She noted that she felt her heart beating in and vibrating when she had a headache but otherwise not really noted significant palpitations.  Sometimes she can feel it beating in the side of her head.  Other than the irregular heartbeat and sensation of beating no real symptoms.  No syncope or near syncope.  No angina or heart failure.  Recent Hospitalizations: None  Reviewed  CV studies:    The following studies were reviewed today: (if available, images/films reviewed: From Epic Chart or Care Everywhere) . None:   Interval History:   Lisa Miles returns now for in [facility] evaluation with no major complaints.  She feels like she is doing well.  She is lost some weight (gained because of lack of exercise) because of better eating, but notes that she has gotten out of the habit of doing exercise during the whole Covid shutdown phase.  She still feels some irregular heartbeats off and on, but not all that significant.  Nothing prolonged.  No syncope or near syncope.  She has not had any angina or heart failure symptoms.  She has been forwarded for sleep study evaluation scheduled for June 16.  As for the coughing and congestion symptoms she had last year she actually ended up being prescribed omeprazole as it was thought to be related to GERD.  It does seem to have helped  but she still has some of the allergy related congestion.  CV Review of Symptoms (Summary) Cardiovascular ROS: positive for - irregular heartbeat and Some exertional dyspnea mostly because she is out of shape now with deconditioning due to lack of exercise. negative for - chest pain, edema, orthopnea, paroxysmal nocturnal dyspnea, rapid heart rate, shortness of breath or Syncope/near syncope, none lightheadedness, dizziness or wooziness., TIA/amaurosis fugax.  Claudication  The patient does not have symptoms concerning for COVID-19 infection (fever, chills, cough, or new shortness of breath).  The patient is practicing social distancing & Masking.    REVIEWED OF SYSTEMS   Review of Systems  Constitutional: Positive for weight loss. Negative for malaise/fatigue.  HENT: Negative for congestion and nosebleeds.   Respiratory: Positive for cough (Thought to be related to GERD). Negative for shortness of breath.   Gastrointestinal: Negative for blood in stool and melena.  Genitourinary: Negative for hematuria.  Neurological: Negative for dizziness and focal weakness.  Psychiatric/Behavioral: Negative for depression. The patient does not have insomnia (Not getting restorative sleep.  Due for OSA evaluation).    I have reviewed and (if needed) personally updated the patient's problem list, medications, allergies, past medical and surgical history, social and family history.   PAST MEDICAL HISTORY   Past Medical History:  Diagnosis Date  . Aortic stenosis, mild 07/2012   Mild AS with Mod MR -(Mean/Peak Gradient 20 mmHg/34 mmHg).  . Asthma   .  Bilateral bunions    Bunionectomies performed  . Bronchitis, chronic ([place])   . Heart disease   . Hyperlipidemia   . Hypertension    Good control  . Inguinodynia    Bilateral groin pain  . Lower extremity edema    Chronic. Venous Duplex 11/06/11 SUMMARY: 1) Bilateral Lower Extremities: No evidence of DVT or thrombophlebitis.  2) Right Common Femoral  Vein: Demonstrated mild valvular insufficiency with a greater than (1) sec of duration. Mildly abnormal LE Venous duplex Doppler.  . Mitral regurgitation 07/2012   Mild AMVL prolapse, Mod MR -- no MVP noted in 01/2018  . Obesity   . Palpitations    Relatively well controlled  . Scoliosis    DG Chest 2 View x-ray on 07/30/11 by Dr. Everlene Farrier shows a scoliosis.    PAST SURGICAL HISTORY   Past Surgical History:  Procedure Laterality Date  . ABDOMINAL HYSTERECTOMY    . ANKLE ARTHROSCOPY Left   . BUNIONECTOMY Bilateral   . KNEE ARTHROSCOPY Right   . Left knee open surgery     pt thinks she only had shots in L knee, not surgery  . TOTAL ABDOMINAL HYSTERECTOMY    . TRANSTHORACIC ECHOCARDIOGRAM  07/2012; 01/2013   a) 4/'14: mild-mod AS, Mod AI (mean/peak gradient: 18 mmHg / 31 mmhg).  Mild MVP w/ Mod MR, no MS.  Mildly elevated PAP (~37 mmHg) w/ Mod TR. EF 55-60%. GR 1 DD.;; b). 01/2013: EF 60-65%. No RWMA. Gr  DD. Mild AS (M / P Gradient 20 mmHg/34 mmHg). Mild AMVL Prolapse w/ Mod MR. Mod LA dilation. Mod TR.  [name] Kitchen TRANSTHORACIC ECHOCARDIOGRAM  01/2015; 01/2016   a) Normal LV size and function. GR 1 DD. Moderate LA Dilation. Calcified aortic valve with mild AS, moderate (2+) AI. Restricted posterior MV leaflet movement w/ Mod MR. Mode TR with mod elevated PA pressures (45 mmHg);; b) Relatively stable. Moderate concentric LVH. Moderate AI with mild AS (Mean-PeaK Gradient: 19 / 36 mmHg). Mild MR. Mod LA dilation. PAP ~40 mmHg -> plan follow-up 2020.  [name] Kitchen TRANSTHORACIC ECHOCARDIOGRAM  01/2018   Normal LV size.  Moderate concentric LVH.  EF 60 to 65%.  No R WMA.  GR 1 DD (high filling pressures).  Mild AS (mean gradient 16 mmHg) with moderate AI.  Severe LA dilation.  Mild MR.    MEDICATIONS/ALLERGIES   Current Meds  Medication Sig  . aspirin 81 MG tablet Take 81 mg by mouth daily.   . calcium-vitamin D (OSCAL WITH D) 250-125 MG-UNIT tablet Take 1 tablet by mouth daily.   . Cholecalciferol (VITAMIN D-3)  1000 UNITS CAPS Take 1 capsule by mouth daily.  [name] Kitchen esomeprazole (NEXIUM) 20 MG capsule Take 1 capsule (20 mg total) by mouth 2 (two) times daily before a meal.  . estradiol (ESTRACE) 2 MG tablet Take 2 mg by mouth daily.  . Melatonin 1 MG/4ML LIQD Take 4 mLs (1 mg total) by mouth at bedtime.  . valsartan (DIOVAN) 320 MG tablet Take 1 tablet by mouth once daily    No Known Allergies  SOCIAL HISTORY/FAMILY HISTORY   Reviewed in Epic:  Pertinent findings: No changes  OBJCTIVE -PE, EKG, labs   Wt Readings from Last 3 Encounters:  09/19/19 214 lb (97.1 kg)  08/24/19 216 lb (98 kg)  08/09/19 217 lb (98.4 kg)    Physical Exam: BP 128/70 (BP Location: Left Arm, Patient Position: Sitting, Cuff Size: Normal)   Pulse (!) 58   Temp (!) 97.3 F (  36.3 C)   Ht 5\' 6"  (1.676 m)   Wt 214 lb (97.1 kg)   BMI 34.54 kg/m  Physical Exam  Constitutional: She appears well-developed and well-nourished. No distress.  Healthy-appearing.  Well-groomed  HENT:  Head: Normocephalic and atraumatic.  Neck: No hepatojugular reflux and no JVD present. Carotid bruit is not present (Radiated aortic murmur).  Cardiovascular: Normal rate, regular rhythm and normal pulses.  Extrasystoles are present. PMI is not displaced. Exam reveals no gallop and no S4.  Murmur heard. High-pitched harsh crescendo-decrescendo midsystolic murmur is present with a grade of 1/6 at the upper right sternal border radiating to the neck. High-pitched blowing holosystolic murmur of grade 1/6 is also present at the lower left sternal border and apex radiating to the axilla.  Decrescendo early diastolic murmur is present at the upper right sternal border. Musculoskeletal:     Cervical back: Normal range of motion and neck supple.  Vitals reviewed.   Adult ECG Report  Rate: 58 ;  Rhythm: sinus bradycardia and Normal axis, intervals and durations;   Narrative Interpretation: Normal EKG.  Recent Labs: March 2021: TC 180, TG 47, HDL 41.   LDL not reported on K PN.  A1c 5.3, CR 0.66.  K+ 4.6.  Hgb 13.2 Lab Results  Component Value Date   CHOL 180 07/06/2018   HDL 68 07/06/2018   LDLCALC 91 07/06/2018   TRIG 104 07/06/2018   CHOLHDL 2.6 07/06/2018   Lab Results  Component Value Date   CREATININE 0.91 08/09/2019   BUN 8 08/09/2019   NA 135 08/09/2019   K 4.5 08/09/2019   CL 101 08/09/2019   CO2 23 08/09/2019   Lab Results  Component Value Date   TSH 1.770 08/09/2019    ASSESSMENT/PLAN    Problem List Items Addressed This Visit    Aortic valve disorder - Primary (Chronic)    Mild aortic stenosis with moderate irritation by recent echo.  Plan is to follow-up echo in October of this year. Continue blood pressure control.  Thankfully, no symptoms.       Dyslipidemia (Chronic)    Unfortunate I do not have all of the labs.  LDL goal is less than 100.  The trajectory of her lipid panel would suggest that the March 2021 LDL would have been better than previous study in March 2020 which was 91.  She is not on any type of medication.  Continue to monitor.  Low threshold to consider adding medical therapy.      Mild mitral insufficiency (Chronic)    Not regurgitation noted on echo likely not significant enough to contribute to exertional dyspnea in the absence of profound hypertension.  Following up every couple years with direct involvement of aortic valve disease.  Plan: 2D echo in October      Lower extremity edema (Chronic)    Not a great candidate for diuretic because of urinary incontinence.  Would recommend foot elevation and support stockings.      Heart palpitations (Chronic)    Thankfully, these are relatively stable.  We are avoiding AV nodal agents because of resting bradycardia.      Relevant Orders   EKG 12-Lead (Completed)   Obesity (BMI 30-39.9)       COVID-19 Education: The signs and symptoms of COVID-19 were discussed with the patient and how to seek care for testing (follow up with  PCP or arrange E-visit).   The importance of social distancing and COVID-19 vaccination was discussed today.  I spent a total of 78minutes with the patient. >  50% of the time was spent in direct patient consultation.  Additional time spent with chart review  / charting (studies, outside notes, etc): 6 Total Time: 26 min   Current medicines are reviewed at length with the patient today.  (+/- concerns) none  Notice: This dictation was prepared with Dragon dictation along with smaller phrase technology. Any transcriptional errors that result from this process are unintentional and may not be corrected upon review.  Patient Instructions / Medication Changes & Studies & Tests Ordered   Patient Instructions  Medication Instructions:  No chnages *If you need a refill on your cardiac medications before your next appointment, please call your pharmacy*   Lab Work: Not needed I   Testing/Procedures: Will be schedule at [facility]  [address]- Oct 2021 Your physician has requested that you have an echocardiogram. Echocardiography is a painless test that uses sound waves to create images of your heart. It provides your doctor with information about the size and shape of your heart and how well your heart's chambers and valves are working. This procedure takes approximately one hour. There are no restrictions for this procedure.     Follow-Up: At [facility], you and your health needs are our priority.  As part of our continuing mission to provide you with exceptional heart care, we have created designated Provider Care Teams.  These Care Teams include your primary Cardiologist (physician) and Advanced Practice Providers (APPs -  Physician Assistants and Nurse Practitioners) who all work together to provide you with the care you need, when you need it.    Your next appointment:   12 month(s)  The format for your next appointment:   In [place]  Provider:   Glenetta Hew,  MD   Other Instructions     Studies Ordered:   Orders Placed This Encounter  Procedures  . EKG 12-Lead     Glenetta Hew, M.D., M.S. Interventional Cardiologist   Pager # [phone] Phone # [phone] [place], [place] [address]   Thank you for choosing Heartcare at [facility]!!

## 2019-09-22 DIAGNOSIS — Z1231 Encounter for screening mammogram for malignant neoplasm of breast: Secondary | ICD-10-CM | POA: Diagnosis not present

## 2019-09-27 ENCOUNTER — Encounter: Payer: Self-pay | Admitting: Cardiology

## 2019-09-27 NOTE — Assessment & Plan Note (Addendum)
Not regurgitation noted on echo likely not significant enough to contribute to exertional dyspnea in the absence of profound hypertension.  Following up every couple years with direct involvement of aortic valve disease.  Plan: 2D echo in October

## 2019-09-27 NOTE — Assessment & Plan Note (Addendum)
Thankfully, these are relatively stable.  We are avoiding AV nodal agents because of resting bradycardia.

## 2019-09-27 NOTE — Assessment & Plan Note (Signed)
Mild aortic stenosis with moderate irritation by recent echo.  Plan is to follow-up echo in October of this year. Continue blood pressure control.  Thankfully, no symptoms.

## 2019-09-27 NOTE — Assessment & Plan Note (Signed)
Unfortunate I do not have all of the labs.  LDL goal is less than 100.  The trajectory of her lipid panel would suggest that the March 2021 LDL would have been better than previous study in March 2020 which was 91.  She is not on any type of medication.  Continue to monitor.  Low threshold to consider adding medical therapy.

## 2019-09-27 NOTE — Assessment & Plan Note (Signed)
Not a great candidate for diuretic because of urinary incontinence.  Would recommend foot elevation and support stockings.

## 2019-10-04 ENCOUNTER — Other Ambulatory Visit: Payer: Self-pay

## 2019-10-04 ENCOUNTER — Encounter: Payer: Self-pay | Admitting: Registered Nurse

## 2019-10-04 ENCOUNTER — Ambulatory Visit (INDEPENDENT_AMBULATORY_CARE_PROVIDER_SITE_OTHER): Payer: PPO | Admitting: Registered Nurse

## 2019-10-04 VITALS — BP 147/76 | HR 80 | Temp 97.8°F | Resp 18 | Ht 66.0 in | Wt 215.0 lb

## 2019-10-04 DIAGNOSIS — R109 Unspecified abdominal pain: Secondary | ICD-10-CM | POA: Diagnosis not present

## 2019-10-04 LAB — POCT URINALYSIS DIP (CLINITEK)
Bilirubin, UA: NEGATIVE
Glucose, UA: NEGATIVE mg/dL
Ketones, POC UA: NEGATIVE mg/dL
Leukocytes, UA: NEGATIVE
Nitrite, UA: NEGATIVE
POC PROTEIN,UA: 30 — AB
Spec Grav, UA: >=1.03 — AB (ref 1.010–1.025)
Urobilinogen, UA: 0.2 E.U./dL
pH, UA: 5.5 (ref 5.0–8.0)

## 2019-10-04 NOTE — Patient Instructions (Signed)
° ° ° °  If you have lab work done today you will be contacted with your lab results within the next 2 weeks.  If you have not heard from us then please contact us. The fastest way to get your results is to register for My Chart. ° ° °IF you received an x-ray today, you will receive an invoice from [facility]. Please contact [facility] at [phone] with questions or concerns regarding your invoice.  ° °IF you received labwork today, you will receive an invoice from [facility]. Please contact [facility] at [phone] with questions or concerns regarding your invoice.  ° °Our billing staff will not be able to assist you with questions regarding bills from these companies. ° °You will be contacted with the lab results as soon as they are available. The fastest way to get your results is to activate your My Chart account. Instructions are located on the last page of this paperwork. If you have not heard from us regarding the results in 2 weeks, please contact this office. °  ° ° ° °

## 2019-10-04 NOTE — Progress Notes (Signed)
Established Patient Office Visit  Subjective:  Patient ID: Lisa Miles, female    DOB: 01-May-1950  Age: 69 y.o. MRN: [id number]  CC:  Chief Complaint  Patient presents with  . Follow-up    2 month follow up chronic cough , but patient states she is doing well with that and the medication worked. Now patient have some stomach pain when laying down she states it feels like when she moves from her side to back it feels likes stomach hurts more    [name] presents for follow up  Globus sensation: resolved with esomeprazole use daily. No further concerns. Denies dysphagia, no more streaking of blood.  However, does note some L sided abdominal pain. Cannot pin point upper or lower quadrant. States it is present when she has been lying on her L side for some time, and then when she changes position, she feels as though something is "moving" and it is painful. Denies constipation, diarrhea, nausea, vomiting, blood in stool, melena, brbpr, change to diet or exercise, acute onset of this pain, or other hx of chronic GI issues.  No other concerns at this time.  Past Medical History:  Diagnosis Date  . Aortic stenosis, mild 07/2012   Mild AS with Mod MR -(Mean/Peak Gradient 20 mmHg/34 mmHg).  . Asthma   . Bilateral bunions    Bunionectomies performed  . Bronchitis, chronic ([place])   . Heart disease   . Hyperlipidemia   . Hypertension    Good control  . Inguinodynia    Bilateral groin pain  . Lower extremity edema    Chronic. Venous Duplex 11/06/11 SUMMARY: 1) Bilateral Lower Extremities: No evidence of DVT or thrombophlebitis.  2) Right Common Femoral Vein: Demonstrated mild valvular insufficiency with a greater than (1) sec of duration. Mildly abnormal LE Venous duplex Doppler.  . Mitral regurgitation 07/2012   Mild AMVL prolapse, Mod MR -- no MVP noted in 01/2018  . Obesity   . Palpitations    Relatively well controlled  . Scoliosis    DG Chest 2 View x-ray on 07/30/11  by Dr. Everlene Farrier shows a scoliosis.    Past Surgical History:  Procedure Laterality Date  . ABDOMINAL HYSTERECTOMY    . ANKLE ARTHROSCOPY Left   . BUNIONECTOMY Bilateral   . KNEE ARTHROSCOPY Right   . Left knee open surgery     pt thinks she only had shots in L knee, not surgery  . TOTAL ABDOMINAL HYSTERECTOMY    . TRANSTHORACIC ECHOCARDIOGRAM  07/2012; 01/2013   a) 4/'14: mild-mod AS, Mod AI (mean/peak gradient: 18 mmHg / 31 mmhg).  Mild MVP w/ Mod MR, no MS.  Mildly elevated PAP (~37 mmHg) w/ Mod TR. EF 55-60%. GR 1 DD.;; b). 01/2013: EF 60-65%. No RWMA. Gr  DD. Mild AS (M / P Gradient 20 mmHg/34 mmHg). Mild AMVL Prolapse w/ Mod MR. Mod LA dilation. Mod TR.  [name] Kitchen TRANSTHORACIC ECHOCARDIOGRAM  01/2015; 01/2016   a) Normal LV size and function. GR 1 DD. Moderate LA Dilation. Calcified aortic valve with mild AS, moderate (2+) AI. Restricted posterior MV leaflet movement w/ Mod MR. Mode TR with mod elevated PA pressures (45 mmHg);; b) Relatively stable. Moderate concentric LVH. Moderate AI with mild AS (Mean-PeaK Gradient: 19 / 36 mmHg). Mild MR. Mod LA dilation. PAP ~40 mmHg -> plan follow-up 2020.  [name] Kitchen TRANSTHORACIC ECHOCARDIOGRAM  01/2018   Normal LV size.  Moderate concentric LVH.  EF 60 to 65%.  No  R WMA.  GR 1 DD (high filling pressures).  Mild AS (mean gradient 16 mmHg) with moderate AI.  Severe LA dilation.  Mild MR.    Family History  Problem Relation Age of Onset  . Arthritis Mother   . Hyperlipidemia Mother   . Diabetes Mother   . Gout Mother   . Hypertension Mother   . Cancer Father        GI cancer  . Diabetes Maternal Grandfather   . Hypertension Brother   . Diabetes Brother   . Hypertension Brother   . Migraines Neg Hx   . Headache Neg Hx     Social History   Socioeconomic History  . Marital status: Married    Spouse name: Herbie Baltimore  . Number of children: 2  . Years of education: Not on file  . Highest education level: Not on file  Occupational History  . Occupation:  production    Employer: [employer]    Comment: check printing/shipping  Tobacco Use  . Smoking status: Never Smoker  . Smokeless tobacco: Never Used  Substance and Sexual Activity  . Alcohol use: No  . Drug use: No  . Sexual activity: Never  Other Topics Concern  . Not on file  Social History Narrative   Lives with her husband.  Their children live nearby.   Right handed   Caffeine: 1 cup/day   Social Determinants of Health   Financial Resource Strain:   . Difficulty of Paying Living Expenses:   Food Insecurity:   . Worried About Charity fundraiser in the Last Year:   . Arboriculturist in the Last Year:   Transportation Needs:   . Film/video editor (Medical):   [name] Kitchen Lack of Transportation (Non-Medical):   Physical Activity:   . Days of Exercise per Week:   . Minutes of Exercise per Session:   Stress:   . Feeling of Stress :   Social Connections:   . Frequency of Communication with Friends and Family:   . Frequency of Social Gatherings with Friends and Family:   . Attends Religious Services:   . Active Member of Clubs or Organizations:   . Attends Archivist Meetings:   [name] Kitchen Marital Status:   Intimate Partner Violence:   . Fear of Current or Ex-Partner:   . Emotionally Abused:   [name] Kitchen Physically Abused:   . Sexually Abused:     Outpatient Medications Prior to Visit  Medication Sig Dispense Refill  . aspirin 81 MG tablet Take 81 mg by mouth daily.     . calcium-vitamin D (OSCAL WITH D) 250-125 MG-UNIT tablet Take 1 tablet by mouth daily.     . Cholecalciferol (VITAMIN D-3) 1000 UNITS CAPS Take 1 capsule by mouth daily.    [name] Kitchen esomeprazole (NEXIUM) 20 MG capsule Take 1 capsule (20 mg total) by mouth 2 (two) times daily before a meal. 60 capsule 1  . estradiol (ESTRACE) 2 MG tablet Take 2 mg by mouth daily.    . Melatonin 1 MG/4ML LIQD Take 4 mLs (1 mg total) by mouth at bedtime. 4 mL 0  . valsartan (DIOVAN) 320 MG tablet Take 1 tablet by mouth once daily 90  tablet 0   No facility-administered medications prior to visit.    No Known Allergies  ROS Review of Systems  Constitutional: Negative.   HENT: Negative.   Eyes: Negative.   Respiratory: Negative.   Cardiovascular: Negative.   Gastrointestinal: Positive for abdominal pain.  Negative for abdominal distention, anal bleeding, blood in stool, constipation, diarrhea, nausea, rectal pain and vomiting.  Endocrine: Negative.   Genitourinary: Negative.   Musculoskeletal: Negative.   Skin: Negative.   Allergic/Immunologic: Negative.   Neurological: Negative.   Hematological: Negative.   Psychiatric/Behavioral: Negative.   All other systems reviewed and are negative.     Objective:    Physical Exam  Constitutional: She appears well-developed and well-nourished. No distress.  Cardiovascular: Normal rate and regular rhythm.  Pulmonary/Chest: Effort normal and breath sounds normal.  Abdominal: Soft. Bowel sounds are normal. She exhibits no distension and no mass. There is no abdominal tenderness. There is no rebound and no guarding.  Skin: Skin is warm and dry. No rash noted. She is not diaphoretic. No erythema. No pallor.  Psychiatric: She has a normal mood and affect. Her behavior is normal. Judgment and thought content normal.  Nursing note and vitals reviewed.   BP (!) 147/76   Pulse 80   Temp 97.8 F (36.6 C) (Temporal)   Resp 18   Ht 5\' 6"  (1.676 m)   Wt 215 lb (97.5 kg)   SpO2 98%   BMI 34.70 kg/m  Wt Readings from Last 3 Encounters:  10/04/19 215 lb (97.5 kg)  09/19/19 214 lb (97.1 kg)  08/24/19 216 lb (98 kg)     There are no preventive care reminders to display for this patient.  There are no preventive care reminders to display for this patient.  Lab Results  Component Value Date   TSH 1.770 08/09/2019   Lab Results  Component Value Date   WBC 4.3 08/09/2019   HGB 12.3 08/09/2019   HCT 36.4 08/09/2019   MCV 89 08/09/2019   PLT 358 08/09/2019   Lab  Results  Component Value Date   NA 135 08/09/2019   K 4.5 08/09/2019   CO2 23 08/09/2019   GLUCOSE 100 (H) 08/09/2019   BUN 8 08/09/2019   CREATININE 0.91 08/09/2019   BILITOT 0.2 08/09/2019   ALKPHOS 53 08/09/2019   AST 18 08/09/2019   ALT 19 08/09/2019   PROT 6.6 08/09/2019   ALBUMIN 3.5 (L) 08/09/2019   CALCIUM 9.0 08/09/2019   ANIONGAP 14 03/29/2014   GFR 98.74 05/23/2014   Lab Results  Component Value Date   CHOL 180 07/06/2018   Lab Results  Component Value Date   HDL 68 07/06/2018   Lab Results  Component Value Date   LDLCALC 91 07/06/2018   Lab Results  Component Value Date   TRIG 104 07/06/2018   Lab Results  Component Value Date   CHOLHDL 2.6 07/06/2018   Lab Results  Component Value Date   HGBA1C 4.6 08/02/2019      Assessment & Plan:   Problem List Items Addressed This Visit    None    Visit Diagnoses    Abdominal pain, unspecified abdominal location    -  Primary   Relevant Orders   POCT URINALYSIS DIP (CLINITEK) (Completed)   Left sided abdominal pain       Relevant Orders   CBC   Lipase   Amylase   Comprehensive metabolic panel      No orders of the defined types were placed in this encounter.   Follow-up: No follow-ups on file.   PLAN  Concern for msk vs intraabdominal cause for pain  POCT UA showing some proteinuria but no evidence of UTI  Will draw labs and follow up as warranted  Patient encouraged to  call clinic with any questions, comments, or concerns.  Maximiano Coss, NP

## 2019-10-05 LAB — COMPREHENSIVE METABOLIC PANEL
ALT: 13 IU/L (ref 0–32)
AST: 16 IU/L (ref 0–40)
Albumin/Globulin Ratio: 1.3 (ref 1.2–2.2)
Albumin: 3.7 g/dL — ABNORMAL LOW (ref 3.8–4.8)
Alkaline Phosphatase: 53 IU/L (ref 48–121)
BUN/Creatinine Ratio: 13 (ref 12–28)
BUN: 12 mg/dL (ref 8–27)
Bilirubin Total: 0.2 mg/dL (ref 0.0–1.2)
CO2: 25 mmol/L (ref 20–29)
Calcium: 9.3 mg/dL (ref 8.7–10.3)
Chloride: 104 mmol/L (ref 96–106)
Creatinine, Ser: 0.96 mg/dL (ref 0.57–1.00)
GFR calc Af Amer: 70 mL/min/{1.73_m2} (ref >59)
GFR calc non Af Amer: 61 mL/min/{1.73_m2} (ref >59)
Globulin, Total: 2.9 g/dL (ref 1.5–4.5)
Glucose: 101 mg/dL — ABNORMAL HIGH (ref 65–99)
Potassium: 4.6 mmol/L (ref 3.5–5.2)
Sodium: 140 mmol/L (ref 134–144)
Total Protein: 6.6 g/dL (ref 6.0–8.5)

## 2019-10-05 LAB — CBC
Hematocrit: 40 % (ref 34.0–46.6)
Hemoglobin: 13.3 g/dL (ref 11.1–15.9)
MCH: 29.5 pg (ref 26.6–33.0)
MCHC: 33.3 g/dL (ref 31.5–35.7)
MCV: 89 fL (ref 79–97)
Platelets: 242 10*3/uL (ref 150–450)
RBC: 4.51 x10E6/uL (ref 3.77–5.28)
RDW: 13.4 % (ref 11.7–15.4)
WBC: 3.6 10*3/uL (ref 3.4–10.8)

## 2019-10-05 LAB — AMYLASE: Amylase: 128 U/L — ABNORMAL HIGH (ref 31–110)

## 2019-10-05 LAB — LIPASE: Lipase: 84 U/L — ABNORMAL HIGH (ref 14–72)

## 2019-10-11 ENCOUNTER — Ambulatory Visit (INDEPENDENT_AMBULATORY_CARE_PROVIDER_SITE_OTHER): Payer: PPO | Admitting: Neurology

## 2019-10-11 DIAGNOSIS — I340 Nonrheumatic mitral (valve) insufficiency: Secondary | ICD-10-CM

## 2019-10-11 DIAGNOSIS — G4733 Obstructive sleep apnea (adult) (pediatric): Secondary | ICD-10-CM

## 2019-10-11 DIAGNOSIS — R600 Localized edema: Secondary | ICD-10-CM

## 2019-10-11 DIAGNOSIS — Z72821 Inadequate sleep hygiene: Secondary | ICD-10-CM

## 2019-10-11 DIAGNOSIS — I359 Nonrheumatic aortic valve disorder, unspecified: Secondary | ICD-10-CM

## 2019-10-11 DIAGNOSIS — G478 Other sleep disorders: Secondary | ICD-10-CM

## 2019-10-13 DIAGNOSIS — H25811 Combined forms of age-related cataract, right eye: Secondary | ICD-10-CM | POA: Diagnosis not present

## 2019-11-02 ENCOUNTER — Telehealth: Payer: Self-pay | Admitting: Neurology

## 2019-11-02 DIAGNOSIS — G4733 Obstructive sleep apnea (adult) (pediatric): Secondary | ICD-10-CM | POA: Insufficient documentation

## 2019-11-02 NOTE — Addendum Note (Signed)
Addended by: Larey Seat on: 11/02/2019 12:56 PM   Modules accepted: Orders

## 2019-11-02 NOTE — Telephone Encounter (Signed)
Called and reviewed the sleep study results with the pt. I reviewed in detail with the patient what the study showed and advised that Dr Brett Fairy recommends Auto CPAP. Reviewed what that was and at this time the patient wants to decline set up. Advised the patient that she has about 5 mths where if she decides we can then send out the orders otherwise she would have to start the process over. Pt verbalized understanding. Pt had no questions at this time but was encouraged to call back if questions arise.

## 2019-11-02 NOTE — Procedures (Signed)
  Patient Information     First Name: Lisa Last Name: Miles ID: [id number]  Birth Date: 09-Jun-2050 Age: 69 Gender: Female  Referring Provider: Sarina Ill, MD BMI: 34.7 (W=216 lb, H=5' 6'')  Neck Circ.:  16 '' Epworth:  10/24   Sleep Study Information    Study Date: 10/12/19 S/H/A Version: 001.001.001.001 / 4.1.1528 / 77  History:    Lisa Miles was seen on 4/29.2021,  she is a right -handed African American female with a medical history of Aortic stenosis, mild (07/2012), Asthma, Bilateral bunions, Bronchitis, chronic ([place]), Heart murmur - aortic valve disease, Hyperlipidemia, Hypertension, Mitral regurgitation (07/2012), Obesity, Palpitations, and Thoracolumbar Scoliosis. Referral by Dr Jaynee Eagles for morning headache evaluation. Reports nocturia, snoring, urinary urge incontinence, GERD, coughing.            Summary & Diagnosis:    Moderate degree of OSA was noted at AHI of 23.3/h, associated with REM sleep at 40.1/h. There was brady-tachycardia recorded, no prolonged hypoxia was seen.   Recommendations:     This REM dependent sleep apnea would need CPAP treatment. The lack of hypoxemia however makes apnea unlikely the cause for morning headaches in this patient.  Humidified Auto CPAP with a setting of 6-16 cm water, 2 cm EPR and mask of patient's choice and comfort.   Interpreting Physician: Larey Seat, MD                         Start Study Time: End Study Time: Total Recording Time:          12:36:01 AM 8:34:15 AM   7 h, 58 min  Total Sleep Time % REM of Sleep Time:  6 h, 50 min  19.4    Mean: 95 Minimum: 87 Maximum: 99  Mean of Desaturations Nadirs (%):   92  Oxygen Desaturation. %: 4-9 10-20 >20 Total  Events Number Total  28 100.0  0 0.0  0 0.0  28 100.0  Oxygen Saturation: <90 <=88 <85 <80 <70  Duration (minutes): Sleep % 0.6 0.4 0.1 0.1 0.0 0.0 0.0 0.0 0.0 0.0     Respiratory Indices       Total Events REM NREM All Night   pRDI: pAHI: ODI:  146  138  28 42.7 40.1 9.6 20.3 19.2 3.6 24.6 23.3 4.7       Pulse Rate Statistics during Sleep (BPM)      Mean: 57 Minimum: 38 Maximum: 129    Sleep Summary  Oxygen Saturation Statistics   Indices are calculated using technically valid sleep time of 5 h, 55 min. pRDI/pAHI are calculated using 02  desaturations ? 3%                 Sleep Stages Chart

## 2019-11-02 NOTE — Telephone Encounter (Signed)
-----   Message from Larey Seat, MD sent at 11/02/2019 12:56 PM EDT ----- Moderate degree of OSA was noted at AHI of 23.3/h, associated with REM sleep at 40.1/h. There was brady-tachycardia recorded, no prolonged hypoxia was seen.   Recommendations:    This REM dependent sleep apnea would need CPAP treatment. The lack of hypoxemia however makes apnea unlikely the cause for morning headaches in this patient.  Humidified Auto CPAP with a setting of 6-16 cm water, 2 cm EPR and mask of patient's choice and comfort.

## 2019-11-03 DIAGNOSIS — Z01818 Encounter for other preprocedural examination: Secondary | ICD-10-CM | POA: Diagnosis not present

## 2019-11-03 DIAGNOSIS — H25811 Combined forms of age-related cataract, right eye: Secondary | ICD-10-CM | POA: Diagnosis not present

## 2019-11-03 DIAGNOSIS — H2511 Age-related nuclear cataract, right eye: Secondary | ICD-10-CM | POA: Diagnosis not present

## 2019-11-08 ENCOUNTER — Other Ambulatory Visit: Payer: Self-pay

## 2019-11-08 ENCOUNTER — Ambulatory Visit: Payer: PPO | Admitting: Registered Nurse

## 2019-11-08 VITALS — BP 147/76 | Ht 66.0 in | Wt 215.0 lb

## 2019-11-08 DIAGNOSIS — Z0000 Encounter for general adult medical examination without abnormal findings: Secondary | ICD-10-CM

## 2019-11-08 DIAGNOSIS — I10 Essential (primary) hypertension: Secondary | ICD-10-CM

## 2019-11-08 MED ORDER — VALSARTAN 320 MG PO TABS: 320.0000 mg | ORAL_TABLET | Freq: Every day | ORAL | 0 refills | Status: DC

## 2019-11-08 NOTE — Patient Instructions (Addendum)
Thank you for taking time to come for your Medicare Wellness Visit. I appreciate your ongoing commitment to your health goals. Please review the following plan we discussed and let me know if I can assist you in the future.  Lisa Ellerman LPN  reventive Care 65 Years and Older, Female Preventive care refers to lifestyle choices and visits with your health care provider that can promote health and wellness. This includes:  A yearly physical exam. This is also called an annual well check.  Regular dental and eye exams.  Immunizations.  Screening for certain conditions.  Healthy lifestyle choices, such as diet and exercise. What can I expect for my preventive care visit? Physical exam Your health care provider will check:  Height and weight. These may be used to calculate body mass index (BMI), which is a measurement that tells if you are at a healthy weight.  Heart rate and blood pressure.  Your skin for abnormal spots. Counseling Your health care provider may ask you questions about:  Alcohol, tobacco, and drug use.  Emotional well-being.  Home and relationship well-being.  Sexual activity.  Eating habits.  History of falls.  Memory and ability to understand (cognition).  Work and work environment.  Pregnancy and menstrual history. What immunizations do I need?  Influenza (flu) vaccine  This is recommended every year. Tetanus, diphtheria, and pertussis (Tdap) vaccine  You may need a Td booster every 10 years. Varicella (chickenpox) vaccine  You may need this vaccine if you have not already been vaccinated. Zoster (shingles) vaccine  You may need this after age 60. Pneumococcal conjugate (PCV13) vaccine  One dose is recommended after age 65. Pneumococcal polysaccharide (PPSV23) vaccine  One dose is recommended after age 65. Measles, mumps, and rubella (MMR) vaccine  You may need at least one dose of MMR if you were born in 1957 or later. You may also  need a second dose. Meningococcal conjugate (MenACWY) vaccine  You may need this if you have certain conditions. Hepatitis A vaccine  You may need this if you have certain conditions or if you travel or work in places where you may be exposed to hepatitis A. Hepatitis B vaccine  You may need this if you have certain conditions or if you travel or work in places where you may be exposed to hepatitis B. Haemophilus influenzae type b (Hib) vaccine  You may need this if you have certain conditions. You may receive vaccines as individual doses or as more than one vaccine together in one shot (combination vaccines). Talk with your health care provider about the risks and benefits of combination vaccines. What tests do I need? Blood tests  Lipid and cholesterol levels. These may be checked every 5 years, or more frequently depending on your overall health.  Hepatitis C test.  Hepatitis B test. Screening  Lung cancer screening. You may have this screening every year starting at age 55 if you have a 30-pack-year history of smoking and currently smoke or have quit within the past 15 years.  Colorectal cancer screening. All adults should have this screening starting at age 50 and continuing until age 75. Your health care provider may recommend screening at age 45 if you are at increased risk. You will have tests every 1-10 years, depending on your results and the type of screening test.  Diabetes screening. This is done by checking your blood sugar (glucose) after you have not eaten for a while (fasting). You may have this done every 1-3   years.  Mammogram. This may be done every 1-2 years. Talk with your health care provider about how often you should have regular mammograms.  BRCA-related cancer screening. This may be done if you have a family history of breast, ovarian, tubal, or peritoneal cancers. Other tests  Sexually transmitted disease (STD) testing.  Bone density scan. This is done  to screen for osteoporosis. You may have this done starting at age 94. Follow these instructions at home: Eating and drinking  Eat a diet that includes fresh fruits and vegetables, whole grains, lean protein, and low-fat dairy products. Limit your intake of foods with high amounts of sugar, saturated fats, and salt.  Take vitamin and mineral supplements as recommended by your health care provider.  Do not drink alcohol if your health care provider tells you not to drink.  If you drink alcohol: ? Limit how much you have to 0-1 drink a day. ? Be aware of how much alcohol is in your drink. In the [place] one drink equals one 12 oz bottle of beer (355 mL), one 5 oz glass of wine (148 mL), or one 1 oz glass of hard liquor (44 mL). Lifestyle  Take daily care of your teeth and gums.  Stay active. Exercise for at least 30 minutes on 5 or more days each week.  Do not use any products that contain nicotine or tobacco, such as cigarettes, e-cigarettes, and chewing tobacco. If you need help quitting, ask your health care provider.  If you are sexually active, practice safe sex. Use a condom or other form of protection in order to prevent STIs (sexually transmitted infections).  Talk with your health care provider about taking a low-dose aspirin or statin. What's next?  Go to your health care provider once a year for a well check visit.  Ask your health care provider how often you should have your eyes and teeth checked.  Stay up to date on all vaccines. This information is not intended to replace advice given to you by your health care provider. Make sure you discuss any questions you have with your health care provider. Document Revised: 04/07/2018 Document Reviewed: 04/07/2018 Elsevier Patient Education  2020 [employer].

## 2019-11-08 NOTE — Progress Notes (Signed)
Presents today for [employer] Visit   Date of last exam: 10-04-2019  Interpreter used for this visit? No  I connected with  [name] on 11/08/19 by a telephone application and verified that I am speaking with the correct Lisa Miles using two identifiers.   I discussed the limitations of evaluation and management by telemedicine. The patient expressed understanding and agreed to proceed.   Patient Care Team: Forrest Moron, MD as PCP - General (Internal Medicine) Leonie Man, MD as PCP - Cardiology (Cardiology) Leonie Man, MD as Consulting Physician (Cardiology) Maisie Fus, MD as Consulting Physician (Obstetrics and Gynecology)   Other items to address today:   Discussed Eye/Dental Discussed immunizations   Other Screening: Last screening for diabetes: 10/04/2019 Last lipid screening: 07/06/2018  ADVANCE DIRECTIVES: Discussed:  no On File: no Materials Provided: yes  Immunization status:  Immunization History  Administered Date(s) Administered  . Fluad Quad(high Dose 65+) 01/06/2019  . Influenza Whole 03/27/2009  . Influenza, High Dose Seasonal PF 04/01/2018  . Influenza,inj,Quad PF,6+ Mos 03/15/2012, 03/28/2013, 07/22/2016  . Moderna SARS-COVID-2 Vaccination 06/10/2019, 07/08/2019  . Pneumococcal Conjugate-13 07/22/2016  . Pneumococcal Polysaccharide-23 04/02/2009, 09/22/2018  . Tdap 03/28/2013     There are no preventive care reminders to display for this patient.   Functional Status Survey: Is the patient deaf or have difficulty hearing?: No Does the patient have difficulty seeing, even when wearing glasses/contacts?: No Does the patient have difficulty concentrating, remembering, or making decisions?: No Does the patient have difficulty walking or climbing stairs?: No Does the patient have difficulty dressing or bathing?: No Does the patient have difficulty doing errands alone such as visiting a doctor's office or  shopping?: No   6CIT Screen 11/08/2019 11/07/2018 11/07/2018  What Year? 0 points 0 points 0 points  What month? 0 points 0 points 0 points  What time? 0 points 0 points 0 points  Count back from 20 0 points 0 points 0 points  Months in reverse 0 points 0 points 0 points  Repeat phrase 0 points 0 points -  Total Score 0 0 -        Office Visit from 11/07/2018 in [place] at [facility]  AUDIT-C Score 0       Home Environment:   No trouble climbing stairs (some knee pain) Lives in one story home No grab bars  Yes  scattered rugs (grabbers) Adequate lighting    Patient Active Problem List   Diagnosis Date Noted  . OSA (obstructive sleep apnea) 11/02/2019  . Non-restorative sleep 08/24/2019  . Inadequate sleep hygiene 08/24/2019  . History of snoring 08/24/2019  . Primary osteoarthritis involving multiple joints 04/01/2018  . Chronic throat clearing 04/01/2018  . Urinary frequency 04/01/2018  . OAB (overactive bladder) 04/01/2018  . Cough 09/02/2017  . Visual changes 10/14/2016  . Menopause 08/20/2015  . Fungal dermatitis 05/23/2014  . Heart palpitations 10/19/2013  . Thyromegaly 03/28/2013  . Routine general medical examination at a health care facility 03/28/2013  . Obesity (BMI 30-39.9) 03/28/2013  . Lower extremity edema   . Inguinodynia 12/23/2012  . Essential hypertension 07/14/2012  . Bilateral groin pain 07/14/2012  . Dyslipidemia 05/30/2009  . Mild mitral insufficiency 05/30/2009  . Aortic valve disorder 05/30/2009  . ASTHMA UNSPECIFIED WITH EXACERBATION 04/02/2009     Past Medical History:  Diagnosis Date  . Aortic stenosis, mild 07/2012   Mild AS with Mod MR -(Mean/Peak Gradient 20 mmHg/34 mmHg).  [name] Kitchen  Arthritis    Phreesia 11/06/2019  . Asthma   . Bilateral bunions    Bunionectomies performed  . Bronchitis, chronic ([place])   . Cataract    Phreesia 11/06/2019  . Heart disease   . Heart murmur    [employer]reesia 11/06/2019  . Hyperlipidemia   .  Hypertension    Good control  . Inguinodynia    Bilateral groin pain  . Lower extremity edema    Chronic. Venous Duplex 11/06/11 SUMMARY: 1) Bilateral Lower Extremities: No evidence of DVT or thrombophlebitis.  2) Right Common Femoral Vein: Demonstrated mild valvular insufficiency with a greater than (1) sec of duration. Mildly abnormal LE Venous duplex Doppler.  . Mitral regurgitation 07/2012   Mild AMVL prolapse, Mod MR -- no MVP noted in 01/2018  . Obesity   . Palpitations    Relatively well controlled  . Scoliosis    DG Chest 2 View x-ray on 07/30/11 by Dr. Everlene Farrier shows a scoliosis.     Past Surgical History:  Procedure Laterality Date  . ABDOMINAL HYSTERECTOMY    . ANKLE ARTHROSCOPY Left   . BUNIONECTOMY Bilateral   . EYE SURGERY N/A    Phreesia 11/06/2019  . KNEE ARTHROSCOPY Right   . Left knee open surgery     pt thinks she only had shots in L knee, not surgery  . TOTAL ABDOMINAL HYSTERECTOMY    . TRANSTHORACIC ECHOCARDIOGRAM  07/2012; 01/2013   a) 4/'14: mild-mod AS, Mod AI (mean/peak gradient: 18 mmHg / 31 mmhg).  Mild MVP w/ Mod MR, no MS.  Mildly elevated PAP (~37 mmHg) w/ Mod TR. EF 55-60%. GR 1 DD.;; b). 01/2013: EF 60-65%. No RWMA. Gr  DD. Mild AS (M / P Gradient 20 mmHg/34 mmHg). Mild AMVL Prolapse w/ Mod MR. Mod LA dilation. Mod TR.  [name] Kitchen TRANSTHORACIC ECHOCARDIOGRAM  01/2015; 01/2016   a) Normal LV size and function. GR 1 DD. Moderate LA Dilation. Calcified aortic valve with mild AS, moderate (2+) AI. Restricted posterior MV leaflet movement w/ Mod MR. Mode TR with mod elevated PA pressures (45 mmHg);; b) Relatively stable. Moderate concentric LVH. Moderate AI with mild AS (Mean-PeaK Gradient: 19 / 36 mmHg). Mild MR. Mod LA dilation. PAP ~40 mmHg -> plan follow-up 2020.  [name] Kitchen TRANSTHORACIC ECHOCARDIOGRAM  01/2018   Normal LV size.  Moderate concentric LVH.  EF 60 to 65%.  No R WMA.  GR 1 DD (high filling pressures).  Mild AS (mean gradient 16 mmHg) with moderate AI.  Severe LA  dilation.  Mild MR.     Family History  Problem Relation Age of Onset  . Arthritis Mother   . Hyperlipidemia Mother   . Diabetes Mother   . Gout Mother   . Hypertension Mother   . Cancer Father        GI cancer  . Diabetes Maternal Grandfather   . Hypertension Brother   . Diabetes Brother   . Hypertension Brother   . Migraines Neg Hx   . Headache Neg Hx      Social History   Socioeconomic History  . Marital status: Married    Spouse name: Herbie Baltimore  . Number of children: 2  . Years of education: Not on file  . Highest education level: Not on file  Occupational History  . Occupation: production    Employer: [employer]    Comment: check printing/shipping  Tobacco Use  . Smoking status: Never Smoker  . Smokeless tobacco: Never Used  Vaping Use  .  Vaping Use: Never used  Substance and Sexual Activity  . Alcohol use: No  . Drug use: No  . Sexual activity: Never  Other Topics Concern  . Not on file  Social History Narrative   Lives with her husband.  Their children live nearby.   Right handed   Caffeine: 1 cup/day   Social Determinants of Health   Financial Resource Strain:   . Difficulty of Paying Living Expenses:   Food Insecurity:   . Worried About Charity fundraiser in the Last Year:   . Arboriculturist in the Last Year:   Transportation Needs:   . Film/video editor (Medical):   [name] Kitchen Lack of Transportation (Non-Medical):   Physical Activity:   . Days of Exercise per Week:   . Minutes of Exercise per Session:   Stress:   . Feeling of Stress :   Social Connections:   . Frequency of Communication with Friends and Family:   . Frequency of Social Gatherings with Friends and Family:   . Attends Religious Services:   . Active Member of Clubs or Organizations:   . Attends Archivist Meetings:   [name] Kitchen Marital Status:   Intimate Partner Violence:   . Fear of Current or Ex-Partner:   . Emotionally Abused:   [name] Kitchen Physically Abused:   . Sexually  Abused:      No Known Allergies   Prior to Admission medications   Medication Sig Start Date End Date Taking? Authorizing Provider  aspirin 81 MG tablet Take 81 mg by mouth daily.    Yes [provider]  calcium-vitamin D (OSCAL WITH D) 250-125 MG-UNIT tablet Take 1 tablet by mouth daily.    Yes [provider]  Cholecalciferol (VITAMIN D-3) 1000 UNITS CAPS Take 1 capsule by mouth daily.   Yes [provider]  estradiol (ESTRACE) 2 MG tablet Take 2 mg by mouth daily.   Yes [provider]  esomeprazole (NEXIUM) 20 MG capsule Take 1 capsule (20 mg total) by mouth 2 (two) times daily before a meal. Patient not taking: Reported on 11/08/2019 08/02/19   Forrest Moron, MD  Melatonin 1 MG/4ML LIQD Take 4 mLs (1 mg total) by mouth at bedtime. Patient not taking: Reported on 11/08/2019 08/24/19   Dohmeier, Asencion Partridge, MD  valsartan (DIOVAN) 320 MG tablet Take 1 tablet by mouth once daily 08/07/19   Forrest Moron, MD     Depression screen [facility] 2/9 11/08/2019 10/04/2019 08/02/2019 07/05/2019 11/07/2018  Decreased Interest 0 0 1 0 0  Down, Depressed, Hopeless 0 0 0 0 0  PHQ - 2 Score 0 0 1 0 0     Fall Risk  11/08/2019 10/04/2019 08/24/2019 08/02/2019 08/02/2019  Falls in the past year? 0 0 0 0 0  Number falls in past yr: 0 0 - 0 0  Injury with Fall? 0 0 - 0 0  Follow up Falls evaluation completed;Education provided Falls evaluation completed - Falls evaluation completed -      PHYSICAL EXAM: BP (!) 147/76 Comment: taken from a previous visit/ patient not in clinic  Ht 5\' 6"  (1.676 m)   Wt 215 lb (97.5 kg)   BMI 34.70 kg/m    Wt Readings from Last 3 Encounters:  11/08/19 215 lb (97.5 kg)  10/04/19 215 lb (97.5 kg)  09/19/19 214 lb (97.1 kg)      Education/Counseling provided regarding diet and exercise, prevention of chronic diseases, smoking/tobacco cessation, if applicable, and  reviewed "Covered Medicare Preventive Services."

## 2019-11-22 DIAGNOSIS — H209 Unspecified iridocyclitis: Secondary | ICD-10-CM | POA: Diagnosis not present

## 2019-12-06 DIAGNOSIS — H209 Unspecified iridocyclitis: Secondary | ICD-10-CM | POA: Diagnosis not present

## 2019-12-25 ENCOUNTER — Other Ambulatory Visit: Payer: Self-pay | Admitting: Family Medicine

## 2019-12-25 ENCOUNTER — Other Ambulatory Visit: Payer: Self-pay | Admitting: Registered Nurse

## 2019-12-25 NOTE — Telephone Encounter (Signed)
Medication: esomeprazole (NEXIUM) 20 MG capsule [[id number]]   Has the patient contacted their pharmacy? YES (Agent: If no, request that the patient contact the pharmacy for the refill.) (Agent: If yes, when and what did the pharmacy advise?)  Preferred Pharmacy (with phone number or street name): [place], [place] [address]  Agent: Please be advised that RX refills may take up to 3 business days. We ask that you follow-up with your pharmacy.

## 2019-12-25 NOTE — Telephone Encounter (Signed)
Requested medication (s) are due for refill today unsure  Requested medication (s) are on the active medication list -yes  Future visit scheduled -no  Last refill: 4 months ago  Notes to clinic: Request for medication:  Patient not taking:   Reported on 11/08/2019  Route:   Oral    Requested Prescriptions  Pending Prescriptions Disp Refills   esomeprazole (NEXIUM) 20 MG capsule 60 capsule 1    Sig: Take 1 capsule (20 mg total) by mouth 2 (two) times daily before a meal.      Gastroenterology: Proton Pump Inhibitors Passed - 12/25/2019 12:08 PM      Passed - Valid encounter within last 12 months    Recent Outpatient Visits           2 months ago Abdominal pain, unspecified abdominal location   [facility], Delfino Lovett, NP   4 months ago Urinary frequency   Primary Care at [facility], Arlie Solomons, MD   5 months ago Frequent headaches   Primary Care at [facility], Arlie Solomons, MD   11 months ago Need for influenza vaccination   [facility] at [facility], Lilia Argue, MD   1 year ago Medicare annual wellness visit, subsequent   Primary Care at [place], MD                  Requested Prescriptions  Pending Prescriptions Disp Refills   esomeprazole ([place]) 20 MG capsule 60 capsule 1    Sig: Take 1 capsule (20 mg total) by mouth 2 (two) times daily before a meal.      Gastroenterology: Proton Pump Inhibitors Passed - 12/25/2019 12:08 PM      Passed - Valid encounter within last 12 months    Recent Outpatient Visits           2 months ago Abdominal pain, unspecified abdominal location   [facility] at [facility], Delfino Lovett, NP   4 months ago Urinary frequency   Primary Care at [facility], Arlie Solomons, MD   5 months ago Frequent headaches   Primary Care at [facility], Arlie Solomons, MD   11 months ago Need for influenza vaccination   [facility] at [facility], Lilia Argue, MD   1 year ago Medicare annual wellness visit, subsequent    Primary Care at [facility], Arlie Solomons, MD

## 2020-01-03 DIAGNOSIS — H209 Unspecified iridocyclitis: Secondary | ICD-10-CM | POA: Diagnosis not present

## 2020-01-31 ENCOUNTER — Ambulatory Visit (HOSPITAL_COMMUNITY): Payer: PPO | Attending: Cardiovascular Disease

## 2020-01-31 ENCOUNTER — Other Ambulatory Visit: Payer: Self-pay

## 2020-01-31 DIAGNOSIS — I359 Nonrheumatic aortic valve disorder, unspecified: Secondary | ICD-10-CM

## 2020-01-31 DIAGNOSIS — I340 Nonrheumatic mitral (valve) insufficiency: Secondary | ICD-10-CM | POA: Diagnosis not present

## 2020-01-31 HISTORY — PX: TRANSTHORACIC ECHOCARDIOGRAM: SHX275

## 2020-01-31 LAB — ECHOCARDIOGRAM COMPLETE
AR max vel: 1.44 cm2
AV Area VTI: 1.54 cm2
AV Area mean vel: 1.54 cm2
AV Mean grad: 15 mmHg
AV Peak grad: 29.8 mmHg
Ao pk vel: 2.73 m/s
Area-P 1/2: 3.53 cm2
MV M vel: 5.24 m/s
MV Peak grad: 109.8 mmHg
P 1/2 time: 499 msec
Radius: 1.1 cm
S' Lateral: 3.6 cm

## 2020-02-03 ENCOUNTER — Other Ambulatory Visit: Payer: Self-pay | Admitting: Registered Nurse

## 2020-02-03 DIAGNOSIS — I10 Essential (primary) hypertension: Secondary | ICD-10-CM

## 2020-02-03 NOTE — Telephone Encounter (Signed)
Requested Prescriptions  Pending Prescriptions Disp Refills  . valsartan (DIOVAN) 320 MG tablet [Pharmacy Med Name: Valsartan 320 MG Oral Tablet] 90 tablet 0    Sig: Take 1 tablet by mouth once daily     Cardiovascular:  Angiotensin Receptor Blockers Failed - 02/03/2020  5:06 PM      Failed - Last BP in normal range    BP Readings from Last 1 Encounters:  11/08/19 (!) 147/76         Passed - Cr in normal range and within 180 days    Creat  Date Value Ref Range Status  01/10/2016 0.85 0.50 - 0.99 mg/dL Final    Comment:      For patients > or = 69 years of age: The upper reference limit for Creatinine is approximately 13% higher for people identified as African-American.      Creatinine, Ser  Date Value Ref Range Status  10/04/2019 0.96 0.57 - 1.00 mg/dL Final         Passed - K in normal range and within 180 days    Potassium  Date Value Ref Range Status  10/04/2019 4.6 3.5 - 5.2 mmol/L Final         Passed - Patient is not pregnant      Passed - Valid encounter within last 6 months    Recent Outpatient Visits          4 months ago Abdominal pain, unspecified abdominal location   Primary Care at [place], NP   6 months ago Urinary frequency   Primary Care at [facility], Arlie Solomons, MD   7 months ago Frequent headaches   Primary Care at [facility], Arlie Solomons, MD   1 year ago Need for influenza vaccination   [facility] at [facility], Lilia Argue, MD   1 year ago Medicare annual wellness visit, subsequent   Primary Care at [place], MD      Future Appointments            In 2 weeks Leonie Man, MD [facility], [facility]

## 2020-02-22 ENCOUNTER — Ambulatory Visit (INDEPENDENT_AMBULATORY_CARE_PROVIDER_SITE_OTHER): Payer: PPO | Admitting: Cardiology

## 2020-02-22 ENCOUNTER — Other Ambulatory Visit: Payer: Self-pay

## 2020-02-22 ENCOUNTER — Encounter: Payer: Self-pay | Admitting: Cardiology

## 2020-02-22 VITALS — BP 162/68 | HR 61 | Ht 66.0 in | Wt 211.4 lb

## 2020-02-22 DIAGNOSIS — I359 Nonrheumatic aortic valve disorder, unspecified: Secondary | ICD-10-CM | POA: Diagnosis not present

## 2020-02-22 DIAGNOSIS — E785 Hyperlipidemia, unspecified: Secondary | ICD-10-CM

## 2020-02-22 DIAGNOSIS — I051 Rheumatic mitral insufficiency: Secondary | ICD-10-CM

## 2020-02-22 DIAGNOSIS — R002 Palpitations: Secondary | ICD-10-CM

## 2020-02-22 DIAGNOSIS — I340 Nonrheumatic mitral (valve) insufficiency: Secondary | ICD-10-CM | POA: Diagnosis not present

## 2020-02-22 DIAGNOSIS — I10 Essential (primary) hypertension: Secondary | ICD-10-CM | POA: Diagnosis not present

## 2020-02-22 NOTE — Progress Notes (Signed)
Primary Care Provider: Forrest Moron, MD Cardiologist: Glenetta Hew, MD Electrophysiologist: None  Clinic Note: Chief Complaint  Patient presents with  . Follow-up    Echocardiogram results  . Cardiac Valve Problem    Both aortic and mitral valve disease.--Now severe MR   HPI:    Lisa Miles is a 69 y.o. female with a PMH notable for HYPERTENSIVE HEART DISEASE, MODERATE-SEVERE AI/MILD AS, MITRAL REGURGITATION who presents today for follow-up of echocardiogram  Lisa Miles was last seen on Sep 19, 2019 for in [place] visit.  She really had no major complaints just she put all of her weight because lack of exercise.  Therefore change her diet.  Not getting out much because of Covid.  Notes intermittent irregular heartbeats off and on but nothing significant.  No angina or heart failure symptoms. -> Had pending sleep evaluation.  Recent Hospitalizations: None  Reviewed  CV studies:    The following studies were reviewed today: (if available, images/films reviewed: From Epic Chart or Care Everywhere) . TTE January 31, 2020: EF 65 to 70% with normal wall motion..  Severe MR with restricted PMVL movement.  (Recommend TEE).  Moderate aortic valve thickening with mild to moderate stenosis, and moderate aortic regurgitation.  Normal RV function.  Mildly elevated pressures.  Severe LA dilation.  o Compared to Last Visit, Mitral Vegetation Now Severe   Interval History:   Lisa Miles is here for closer than expected follow-up because of the results of her echocardiogram.  What she notes is that she still has the occasional rapid heart rate spells but they do not last very long.  Maybe a minute or 2.  Usually occur with rest and she does baby is a little bit dizzy or short of breath with it but nothing prolonged.  The only really shortness of breath she feels is because she is got some nasal congestion.  Mild edema but no PND or orthopnea.  She sleeps with 2 pillows for comfort.   She sleeps with her feet elevated because the right leg seems to be little more swollen than the left, but nothing dramatic.  Denies any chest pain or pressure with rest or exertion.  No exertional dyspnea.    CV Review of Symptoms (Summary): positive for - Mild exertional dyspnea if she exerts herself more than usual, mild end of day edema and occasional rapid heart rates. negative for - chest pain, irregular heartbeat, orthopnea, paroxysmal nocturnal dyspnea, shortness of breath or Syncope/near syncope, TIA/amaurosis fugax or claudication  The patient does not have symptoms concerning for COVID-19 infection (fever, chills, cough, or new shortness of breath).   REVIEWED OF SYSTEMS   Review of Systems  Constitutional: Negative for malaise/fatigue (Does not have a lot of energy) and weight loss (Her weight is down a little bit, but not necessarily intentional).  HENT: Positive for congestion (Sinus congestion with occasional sore throat).   Respiratory: Negative for shortness of breath (Not really-more related to congestion).   Gastrointestinal: Negative for blood in stool and melena.  Genitourinary: Negative for hematuria.  Musculoskeletal: Negative for falls and joint pain.  Neurological: Negative for dizziness, focal weakness and weakness.  Psychiatric/Behavioral: Negative for memory loss. The patient is not nervous/anxious and does not have insomnia.    I have reviewed and (if needed) personally updated the patient's problem list, medications, allergies, past medical and surgical history, social and family history.   PAST MEDICAL HISTORY   Past Medical History:  Diagnosis  Date  . Aortic stenosis, mild 07/2012   Mild AS with Mod MR -(Mean/Peak Gradient 20 mmHg/34 mmHg).  . Arthritis    Phreesia 11/06/2019  . Asthma   . Bilateral bunions    Bunionectomies performed  . Bronchitis, chronic ([place])   . Cataract    Phreesia 11/06/2019  . Heart disease   . Heart murmur    Phreesia  11/06/2019  . Hyperlipidemia   . Hypertension    Good control  . Inguinodynia    Bilateral groin pain  . Lower extremity edema    Chronic. Venous Duplex 11/06/11 SUMMARY: 1) Bilateral Lower Extremities: No evidence of DVT or thrombophlebitis.  2) Right Common Femoral Vein: Demonstrated mild valvular insufficiency with a greater than (1) sec of duration. Mildly abnormal LE Venous duplex Doppler.  . Mitral regurgitation 07/2012   Mild AMVL prolapse, Mod MR -- no MVP noted in 01/2018  . Obesity   . Palpitations    Relatively well controlled  . Scoliosis    DG Chest 2 View x-ray on 07/30/11 by Dr. Everlene Farrier shows a scoliosis.    PAST SURGICAL HISTORY   Past Surgical History:  Procedure Laterality Date  . ABDOMINAL HYSTERECTOMY    . ANKLE ARTHROSCOPY Left   . BUNIONECTOMY Bilateral   . EYE SURGERY N/A    Phreesia 11/06/2019  . KNEE ARTHROSCOPY Right   . Left knee open surgery     pt thinks she only had shots in L knee, not surgery  . TOTAL ABDOMINAL HYSTERECTOMY    . TRANSTHORACIC ECHOCARDIOGRAM  07/2012; 01/2013   a) 4/'14: mild-mod AS, Mod AI (mean/peak gradient: 18 mmHg / 31 mmhg).  Mild MVP w/ Mod MR, no MS.  Mildly elevated PAP (~37 mmHg) w/ Mod TR. EF 55-60%. GR 1 DD.;; b). 01/2013: EF 60-65%. No RWMA. Gr  DD. Mild AS (M / P Gradient 20 mmHg/34 mmHg). Mild AMVL Prolapse w/ Mod MR. Mod LA dilation. Mod TR.  [name] Kitchen TRANSTHORACIC ECHOCARDIOGRAM  01/2015; 01/2016   a) Normal LV size and function. GR 1 DD. Moderate LA Dilation. Calcified aortic valve with mild AS, moderate (2+) AI. Restricted posterior MV leaflet movement w/ Mod MR. Mode TR with mod elevated PA pressures (45 mmHg);; b) Relatively stable. Moderate concentric LVH. Moderate AI with mild AS (Mean-PeaK Gradient: 19 / 36 mmHg). Mild MR. Mod LA dilation. PAP ~40 mmHg -> plan follow-up 2020.  [name] Kitchen TRANSTHORACIC ECHOCARDIOGRAM  01/2018   Normal LV size.  Moderate concentric LVH.  EF 60 to 65%.  No R WMA.  GR 1 DD (high filling pressures).  Mild  AS (mean gradient 16 mmHg) with moderate AI.  Severe LA dilation.  Mild MR.  . TRANSTHORACIC ECHOCARDIOGRAM  01/31/2020    EF 65 to 70% with normal wall motion..  Severe MR with restricted PMVL movement.  (Recommend TEE).  Moderate aortic valve thickening with mild to moderate stenosis, and moderate aortic regurgitation.  Normal RV function.  Mildly elevated pressures.  Severe LA dilation.     Immunization History  Administered Date(s) Administered  . Fluad Quad(high Dose 65+) 01/06/2019  . Influenza Whole 03/27/2009  . Influenza, High Dose Seasonal PF 04/01/2018  . Influenza,inj,Quad PF,6+ Mos 03/15/2012, 03/28/2013, 07/22/2016  . Moderna SARS-COVID-2 Vaccination 06/10/2019, 07/08/2019  . Pneumococcal Conjugate-13 07/22/2016  . Pneumococcal Polysaccharide-23 04/02/2009, 09/22/2018  . Tdap 03/28/2013    MEDICATIONS/ALLERGIES   Current Meds  Medication Sig  . aspirin 81 MG tablet Take 81 mg by mouth daily.   [name] Kitchen  calcium-vitamin D (OSCAL WITH D) 250-125 MG-UNIT tablet Take 1 tablet by mouth daily.   . Cholecalciferol (VITAMIN D-3) 1000 UNITS CAPS Take 1 capsule by mouth daily.  [name] Kitchen esomeprazole (NEXIUM) 20 MG capsule TAKE 1 CAPSULE(20 MG) BY MOUTH TWICE DAILY BEFORE A MEAL  . estradiol (ESTRACE) 2 MG tablet Take 2 mg by mouth daily.  . Melatonin 1 MG/4ML LIQD Take 4 mLs (1 mg total) by mouth at bedtime.  . valsartan (DIOVAN) 320 MG tablet Take 1 tablet by mouth once daily    No Known Allergies  SOCIAL HISTORY/FAMILY HISTORY   Reviewed in Epic:  Pertinent findings: No changes  OBJCTIVE -PE, EKG, labs   Wt Readings from Last 3 Encounters:  02/22/20 211 lb 6.4 oz (95.9 kg)  11/08/19 215 lb (97.5 kg)  10/04/19 215 lb (97.5 kg)    Physical Exam: BP (!) 162/68   Pulse 61   Ht 5\' 6"  (1.676 m)   Wt 211 lb 6.4 oz (95.9 kg)   BMI 34.12 kg/m  Physical Exam Vitals reviewed.  Constitutional:      General: She is not in acute distress.    Appearance: Normal appearance. She is  obese. She is not ill-appearing or toxic-appearing.  HENT:     Head: Normocephalic and atraumatic.  Neck:     Vascular: No carotid bruit (Radiated aortic murmur), hepatojugular reflux or JVD.  Cardiovascular:     Rate and Rhythm: Normal rate and regular rhythm. Occasional extrasystoles are present.    Chest Wall: PMI is not displaced.     Pulses: Intact distal pulses.     Heart sounds: Murmur heard. High-pitched harsh crescendo-decrescendo mid to late systolic murmur is present with a grade of 3/6 at the upper right sternal border radiating to the neck. High-pitched blowing holosystolic murmur of grade 3/6 is also present at the lower left sternal border and apex radiating to the axilla and back. High-pitched blowing decrescendo early diastolic murmur is present with a grade of 1/4 at the upper right sternal border radiating to the apex.  No friction rub.  Pulmonary:     Effort: Pulmonary effort is normal. No respiratory distress.     Breath sounds: Normal breath sounds.  Chest:     Chest wall: No tenderness.  Abdominal:     General: Bowel sounds are normal. There is distension.     Palpations: Abdomen is soft. There is no mass (No HSM).  Musculoskeletal:        General: Swelling (Trivial-1+ bilateral LE) present. Normal range of motion.     Cervical back: Normal range of motion and neck supple.  Lymphadenopathy:     Cervical: No cervical adenopathy.  Neurological:     General: No focal deficit present.     Mental Status: She is alert and oriented to Shook, place, and time.  Psychiatric:        Mood and Affect: Mood normal.        Behavior: Behavior normal.        Thought Content: Thought content normal.        Judgment: Judgment normal.     Adult ECG Report  Rate: 61 ;  Rhythm: normal sinus rhythm and LVH otherwise normal axis, intervals and durations.;   Narrative Interpretation: Stable EKG  Recent Labs:    Lab Results  Component Value Date   CHOL 180 07/06/2018   HDL  68 07/06/2018   LDLCALC 91 07/06/2018   TRIG 104 07/06/2018   CHOLHDL 2.6  07/06/2018   Lab Results  Component Value Date   CREATININE 0.96 10/04/2019   BUN 12 10/04/2019   NA 140 10/04/2019   K 4.6 10/04/2019   CL 104 10/04/2019   CO2 25 10/04/2019   Lab Results  Component Value Date   TSH 1.770 08/09/2019    ASSESSMENT/PLAN    Problem List Items Addressed This Visit    Aortic valve disorder (Chronic)    She has combination mild to moderate aortic stenosis as well as moderate regurgitation in the setting of mitral valve disease as well.  Plan is for TEE to evaluate mitral regurgitation we can also assess the aortic valve at the same time.  Based on results of TEE, may consider right and left heart catheterization and referral to CVTS.[name] Kitchen  Thankfully, she is not yet symptomatic aside some mild exertional dyspnea.      Dyslipidemia (Chronic)    Last labs available are from March 2020, and have been followed by PCP. She should be due for getting labs checked soon of the labs were checked in June of this year.  If we were to go for cardiac catheterization, I would check a fasting lipid panel.      Severe mitral regurgitation - Primary (Chronic)    Interestingly, the mitral vegetation became notably worse compared to last Echo in 2019.   There does seem to be a slight worsening of exam.  I would not expect him to be the mitral valve that would become severe before the aortic valve.  There is appearance of possible rheumatic mitral disease versus even potentially ischemic.  Interestingly LVEF is well-preserved which would argue against ischemic.   She is relatively asymptomatic and therefore we are not rushed to proceed with valve repair unless there is signs of cord damage etc.  Because of some subtle abnormalities, the recommendation was TEE for better assessment.  Pending results of TEE will decide whether or not to proceed directly to referral for valve surgery.  We will  hold off on doing right left heart cath.  Discussed concerning symptoms, and she really is not having that much in the way of any exertional dyspnea or chest pain.  She is, however, somewhat sedentary.      Essential hypertension (Chronic)    Blood pressure still high today.  We have severe valvular disease, need to make sure that her pressures are improved.  She does have some mild edema. Plan: Add HCTZ 25 mg daily.  Continue valsartan.  Would not do beta-blocker for now his resting heart rate is 61.      Relevant Orders   EKG 12-Lead (Completed)   Heart palpitations (Chronic)    Relatively stable.  Avoiding AV nodal agents due to borderline resting bradycardia.       Other Visit Diagnoses    Rheumatic mitral regurgitation       Relevant Orders   EKG 12-Lead (Completed)   Basic metabolic panel   CBC      Basic procedure details along with risks, benefits, alternatives and indications of TEE responded patient and all questions were answered.   She agrees to proceed with TEE.   COVID-19 Education: The signs and symptoms of COVID-19 were discussed with the patient and how to seek care for testing (follow up with PCP or arrange E-visit).   The importance of social distancing and COVID-19 vaccination was discussed today. The patient is practicing social distancing & Masking.   I spent a total of 53minutes with  the patient spent in direct patient consultation.  Additional time spent with chart review  / charting (studies, outside notes, etc): 15 Total Time: 48 min   Current medicines are reviewed at length with the patient today.  (+/- concerns) n/a  This visit occurred during the SARS-CoV-2 public health emergency.  Safety protocols were in place, including screening questions prior to the visit, additional usage of staff PPE, and extensive cleaning of exam room while observing appropriate contact time as indicated for disinfecting solutions.  Notice: This dictation was  prepared with Dragon dictation along with smaller phrase technology. Any transcriptional errors that result from this process are unintentional and may not be corrected upon review.  Patient Instructions / Medication Changes & Studies & Tests Ordered   Patient Instructions  Medication Instructions:  NO CHANGES  *If you need a refill on your cardiac medications before your next appointment, please call your pharmacy*   Lab Work: CBC BMP COVID TESTING SEE ACCOMPANYING  INSTRUCTIONS   If you have labs (blood work) drawn today and your tests are completely normal, you will receive your results only by: [name] Kitchen MyChart Message (if you have MyChart) OR . A paper copy in the mail If you have any lab test that is abnormal or we need to change your treatment, we will call you to review the results.   Testing/Procedures: Your physician has requested that you have an TRANSESOPHAGEAL  echocardiogram. Echocardiography is a painless test that uses sound waves to create images of your heart. It provides your doctor with information about the size and shape of your heart and how well your heart's chambers and valves are working. This procedure takes approximately one hour. There are no restrictions for this procedure.    Follow-Up: At [facility], you and your health needs are our priority.  As part of our continuing mission to provide you with exceptional heart care, we have created designated Provider Care Teams.  These Care Teams include your primary Cardiologist (physician) and Advanced Practice Providers (APPs -  Physician Assistants and Nurse Practitioners) who all work together to provide you with the care you need, when you need it.     Your next appointment:   1 month(s)  The format for your next appointment:   In [place]  Provider:   Glenetta Hew, MD   Other Instructions   Dear Mrs Burditt, You are scheduled for a TEE on  Wednesday Mar 13, 2020  with Dr. Audie Box.  Please arrive at  the [facility] (Main Entrance A) at [facility]: [place], [place] [address] at 8 am.   DIET: Nothing to eat or drink after midnight except a sip of water with medications (see medication instructions below)  Medication Instructions: No changes   Labs:  cbc, bmp  Nov 12,2021Come to the lab at [address] between the hours of 8:00 am and 4:00 pm. You do not have to be fasting.  You will need a COVID-19  Test on Mar 09, 2020 at 10:50 am 3 days  prior to your procedure. This is a Drive Up Visit at [address] [place], [place] [address]. Someone will direct you to the appropriate testing line. Stay in your car and someone will be with you shortly. You must have a responsible [place] to drive you home and stay in the waiting area during your procedure. Failure to do so could result in cancellation.    Bring your insurance cards.  *  Special Note: Every effort is made to have your procedure done on time. Occasionally there are emergencies that occur at the hospital that may cause delays. Please be patient if a delay does occur.        Studies Ordered:   Orders Placed This Encounter  Procedures  . Basic metabolic panel  . CBC  . EKG 12-Lead     Glenetta Hew, M.D., M.S. Interventional Cardiologist   Pager # [phone] Phone # [phone] [address] [place], [place] [address]   Thank you for choosing Heartcare at [facility]!!

## 2020-02-22 NOTE — Patient Instructions (Addendum)
Medication Instructions:  NO CHANGES  *If you need a refill on your cardiac medications before your next appointment, please call your pharmacy*   Lab Work: CBC BMP COVID TESTING SEE ACCOMPANYING  INSTRUCTIONS   If you have labs (blood work) drawn today and your tests are completely normal, you will receive your results only by: [name] Kitchen MyChart Message (if you have MyChart) OR . A paper copy in the mail If you have any lab test that is abnormal or we need to change your treatment, we will call you to review the results.   Testing/Procedures: Your physician has requested that you have an TRANSESOPHAGEAL  echocardiogram. Echocardiography is a painless test that uses sound waves to create images of your heart. It provides your doctor with information about the size and shape of your heart and how well your heart's chambers and valves are working. This procedure takes approximately one hour. There are no restrictions for this procedure.    Follow-Up: At [facility], you and your health needs are our priority.  As part of our continuing mission to provide you with exceptional heart care, we have created designated Provider Care Teams.  These Care Teams include your primary Cardiologist (physician) and Advanced Practice Providers (APPs -  Physician Assistants and Nurse Practitioners) who all work together to provide you with the care you need, when you need it.     Your next appointment:   1 month(s)  The format for your next appointment:   In [place]  Provider:   Glenetta Hew, MD   Other Instructions   Dear Lisa Miles, You are scheduled for a TEE on  Wednesday Mar 13, 2020  with Dr. Audie Box.  Please arrive at the [facility] (Main Entrance A) at [facility]: [place], [place] [address] at 8 am.   DIET: Nothing to eat or drink after midnight except a sip of water with medications (see medication instructions below)  Medication Instructions: No  changes   Labs:  cbc, bmp  Nov 12,2021Come to the lab at [address] between the hours of 8:00 am and 4:00 pm. You do not have to be fasting.  You will need a COVID-19  Test on Mar 09, 2020 at 10:50 am 3 days  prior to your procedure. This is a Drive Up Visit at [address] [place], [place] [address]. Someone will direct you to the appropriate testing line. Stay in your car and someone will be with you shortly. You must have a responsible [place] to drive you home and stay in the waiting area during your procedure. Failure to do so could result in cancellation.    Bring your insurance cards.  *Special Note: Every effort is made to have your procedure done on time. Occasionally there are emergencies that occur at the hospital that may cause delays. Please be patient if a delay does occur.

## 2020-02-22 NOTE — H&P (View-Only) (Signed)
Primary Care Provider: Forrest Moron, MD Cardiologist: Glenetta Hew, MD Electrophysiologist: None  Clinic Note: Chief Complaint  Patient presents with  . Follow-up    Echocardiogram results  . Cardiac Valve Problem    Both aortic and mitral valve disease.--Now severe MR   HPI:    Lisa Miles is a 69 y.o. female with a PMH notable for HYPERTENSIVE HEART DISEASE, MODERATE-SEVERE AI/MILD AS, MITRAL REGURGITATION who presents today for follow-up of echocardiogram  Lisa Miles was last seen on Sep 19, 2019 for in [place] visit.  She really had no major complaints just she put all of her weight because lack of exercise.  Therefore change her diet.  Not getting out much because of Covid.  Notes intermittent irregular heartbeats off and on but nothing significant.  No angina or heart failure symptoms. -> Had pending sleep evaluation.  Recent Hospitalizations: None  Reviewed  CV studies:    The following studies were reviewed today: (if available, images/films reviewed: From Epic Chart or Care Everywhere) . TTE January 31, 2020: EF 65 to 70% with normal wall motion..  Severe MR with restricted PMVL movement.  (Recommend TEE).  Moderate aortic valve thickening with mild to moderate stenosis, and moderate aortic regurgitation.  Normal RV function.  Mildly elevated pressures.  Severe LA dilation.  o Compared to Last Visit, Mitral Vegetation Now Severe   Interval History:   Lisa Miles is here for closer than expected follow-up because of the results of her echocardiogram.  What she notes is that she still has the occasional rapid heart rate spells but they do not last very long.  Maybe a minute or 2.  Usually occur with rest and she does baby is a little bit dizzy or short of breath with it but nothing prolonged.  The only really shortness of breath she feels is because she is got some nasal congestion.  Mild edema but no PND or orthopnea.  She sleeps with 2 pillows for comfort.   She sleeps with her feet elevated because the right leg seems to be little more swollen than the left, but nothing dramatic.  Denies any chest pain or pressure with rest or exertion.  No exertional dyspnea.    CV Review of Symptoms (Summary): positive for - Mild exertional dyspnea if she exerts herself more than usual, mild end of day edema and occasional rapid heart rates. negative for - chest pain, irregular heartbeat, orthopnea, paroxysmal nocturnal dyspnea, shortness of breath or Syncope/near syncope, TIA/amaurosis fugax or claudication  The patient does not have symptoms concerning for COVID-19 infection (fever, chills, cough, or new shortness of breath).   REVIEWED OF SYSTEMS   Review of Systems  Constitutional: Negative for malaise/fatigue (Does not have a lot of energy) and weight loss (Her weight is down a little bit, but not necessarily intentional).  HENT: Positive for congestion (Sinus congestion with occasional sore throat).   Respiratory: Negative for shortness of breath (Not really-more related to congestion).   Gastrointestinal: Negative for blood in stool and melena.  Genitourinary: Negative for hematuria.  Musculoskeletal: Negative for falls and joint pain.  Neurological: Negative for dizziness, focal weakness and weakness.  Psychiatric/Behavioral: Negative for memory loss. The patient is not nervous/anxious and does not have insomnia.    I have reviewed and (if needed) personally updated the patient's problem list, medications, allergies, past medical and surgical history, social and family history.   PAST MEDICAL HISTORY   Past Medical History:  Diagnosis  Date  . Aortic stenosis, mild 07/2012   Mild AS with Mod MR -(Mean/Peak Gradient 20 mmHg/34 mmHg).  . Arthritis    Phreesia 11/06/2019  . Asthma   . Bilateral bunions    Bunionectomies performed  . Bronchitis, chronic ([place])   . Cataract    Phreesia 11/06/2019  . Heart disease   . Heart murmur    Phreesia  11/06/2019  . Hyperlipidemia   . Hypertension    Good control  . Inguinodynia    Bilateral groin pain  . Lower extremity edema    Chronic. Venous Duplex 11/06/11 SUMMARY: 1) Bilateral Lower Extremities: No evidence of DVT or thrombophlebitis.  2) Right Common Femoral Vein: Demonstrated mild valvular insufficiency with a greater than (1) sec of duration. Mildly abnormal LE Venous duplex Doppler.  . Mitral regurgitation 07/2012   Mild AMVL prolapse, Mod MR -- no MVP noted in 01/2018  . Obesity   . Palpitations    Relatively well controlled  . Scoliosis    DG Chest 2 View x-ray on 07/30/11 by Dr. Everlene Farrier shows a scoliosis.    PAST SURGICAL HISTORY   Past Surgical History:  Procedure Laterality Date  . ABDOMINAL HYSTERECTOMY    . ANKLE ARTHROSCOPY Left   . BUNIONECTOMY Bilateral   . EYE SURGERY N/A    Phreesia 11/06/2019  . KNEE ARTHROSCOPY Right   . Left knee open surgery     pt thinks she only had shots in L knee, not surgery  . TOTAL ABDOMINAL HYSTERECTOMY    . TRANSTHORACIC ECHOCARDIOGRAM  07/2012; 01/2013   a) 4/'14: mild-mod AS, Mod AI (mean/peak gradient: 18 mmHg / 31 mmhg).  Mild MVP w/ Mod MR, no MS.  Mildly elevated PAP (~37 mmHg) w/ Mod TR. EF 55-60%. GR 1 DD.;; b). 01/2013: EF 60-65%. No RWMA. Gr  DD. Mild AS (M / P Gradient 20 mmHg/34 mmHg). Mild AMVL Prolapse w/ Mod MR. Mod LA dilation. Mod TR.  [name] Kitchen TRANSTHORACIC ECHOCARDIOGRAM  01/2015; 01/2016   a) Normal LV size and function. GR 1 DD. Moderate LA Dilation. Calcified aortic valve with mild AS, moderate (2+) AI. Restricted posterior MV leaflet movement w/ Mod MR. Mode TR with mod elevated PA pressures (45 mmHg);; b) Relatively stable. Moderate concentric LVH. Moderate AI with mild AS (Mean-PeaK Gradient: 19 / 36 mmHg). Mild MR. Mod LA dilation. PAP ~40 mmHg -> plan follow-up 2020.  [name] Kitchen TRANSTHORACIC ECHOCARDIOGRAM  01/2018   Normal LV size.  Moderate concentric LVH.  EF 60 to 65%.  No R WMA.  GR 1 DD (high filling pressures).  Mild  AS (mean gradient 16 mmHg) with moderate AI.  Severe LA dilation.  Mild MR.  . TRANSTHORACIC ECHOCARDIOGRAM  01/31/2020    EF 65 to 70% with normal wall motion..  Severe MR with restricted PMVL movement.  (Recommend TEE).  Moderate aortic valve thickening with mild to moderate stenosis, and moderate aortic regurgitation.  Normal RV function.  Mildly elevated pressures.  Severe LA dilation.     Immunization History  Administered Date(s) Administered  . Fluad Quad(high Dose 65+) 01/06/2019  . Influenza Whole 03/27/2009  . Influenza, High Dose Seasonal PF 04/01/2018  . Influenza,inj,Quad PF,6+ Mos 03/15/2012, 03/28/2013, 07/22/2016  . Moderna SARS-COVID-2 Vaccination 06/10/2019, 07/08/2019  . Pneumococcal Conjugate-13 07/22/2016  . Pneumococcal Polysaccharide-23 04/02/2009, 09/22/2018  . Tdap 03/28/2013    MEDICATIONS/ALLERGIES   Current Meds  Medication Sig  . aspirin 81 MG tablet Take 81 mg by mouth daily.   [name] Kitchen  calcium-vitamin D (OSCAL WITH D) 250-125 MG-UNIT tablet Take 1 tablet by mouth daily.   . Cholecalciferol (VITAMIN D-3) 1000 UNITS CAPS Take 1 capsule by mouth daily.  [name] Kitchen esomeprazole (NEXIUM) 20 MG capsule TAKE 1 CAPSULE(20 MG) BY MOUTH TWICE DAILY BEFORE A MEAL  . estradiol (ESTRACE) 2 MG tablet Take 2 mg by mouth daily.  . Melatonin 1 MG/4ML LIQD Take 4 mLs (1 mg total) by mouth at bedtime.  . valsartan (DIOVAN) 320 MG tablet Take 1 tablet by mouth once daily    No Known Allergies  SOCIAL HISTORY/FAMILY HISTORY   Reviewed in Epic:  Pertinent findings: No changes  OBJCTIVE -PE, EKG, labs   Wt Readings from Last 3 Encounters:  02/22/20 211 lb 6.4 oz (95.9 kg)  11/08/19 215 lb (97.5 kg)  10/04/19 215 lb (97.5 kg)    Physical Exam: BP (!) 162/68   Pulse 61   Ht 5\' 6"  (1.676 m)   Wt 211 lb 6.4 oz (95.9 kg)   BMI 34.12 kg/m  Physical Exam Vitals reviewed.  Constitutional:      General: She is not in acute distress.    Appearance: Normal appearance. She is  obese. She is not ill-appearing or toxic-appearing.  HENT:     Head: Normocephalic and atraumatic.  Neck:     Vascular: No carotid bruit (Radiated aortic murmur), hepatojugular reflux or JVD.  Cardiovascular:     Rate and Rhythm: Normal rate and regular rhythm. Occasional extrasystoles are present.    Chest Wall: PMI is not displaced.     Pulses: Intact distal pulses.     Heart sounds: Murmur heard. High-pitched harsh crescendo-decrescendo mid to late systolic murmur is present with a grade of 3/6 at the upper right sternal border radiating to the neck. High-pitched blowing holosystolic murmur of grade 3/6 is also present at the lower left sternal border and apex radiating to the axilla and back. High-pitched blowing decrescendo early diastolic murmur is present with a grade of 1/4 at the upper right sternal border radiating to the apex.  No friction rub.  Pulmonary:     Effort: Pulmonary effort is normal. No respiratory distress.     Breath sounds: Normal breath sounds.  Chest:     Chest wall: No tenderness.  Abdominal:     General: Bowel sounds are normal. There is distension.     Palpations: Abdomen is soft. There is no mass (No HSM).  Musculoskeletal:        General: Swelling (Trivial-1+ bilateral LE) present. Normal range of motion.     Cervical back: Normal range of motion and neck supple.  Lymphadenopathy:     Cervical: No cervical adenopathy.  Neurological:     General: No focal deficit present.     Mental Status: She is alert and oriented to Lisa Miles, place, and time.  Psychiatric:        Mood and Affect: Mood normal.        Behavior: Behavior normal.        Thought Content: Thought content normal.        Judgment: Judgment normal.     Adult ECG Report  Rate: 61 ;  Rhythm: normal sinus rhythm and LVH otherwise normal axis, intervals and durations.;   Narrative Interpretation: Stable EKG  Recent Labs:    Lab Results  Component Value Date   CHOL 180 07/06/2018   HDL  68 07/06/2018   LDLCALC 91 07/06/2018   TRIG 104 07/06/2018   CHOLHDL 2.6  07/06/2018   Lab Results  Component Value Date   CREATININE 0.96 10/04/2019   BUN 12 10/04/2019   NA 140 10/04/2019   K 4.6 10/04/2019   CL 104 10/04/2019   CO2 25 10/04/2019   Lab Results  Component Value Date   TSH 1.770 08/09/2019    ASSESSMENT/PLAN    Problem List Items Addressed This Visit    Aortic valve disorder (Chronic)    She has combination mild to moderate aortic stenosis as well as moderate regurgitation in the setting of mitral valve disease as well.  Plan is for TEE to evaluate mitral regurgitation we can also assess the aortic valve at the same time.  Based on results of TEE, may consider right and left heart catheterization and referral to CVTS.[name] Kitchen  Thankfully, she is not yet symptomatic aside some mild exertional dyspnea.      Dyslipidemia (Chronic)    Last labs available are from March 2020, and have been followed by PCP. She should be due for getting labs checked soon of the labs were checked in June of this year.  If we were to go for cardiac catheterization, I would check a fasting lipid panel.      Severe mitral regurgitation - Primary (Chronic)    Interestingly, the mitral vegetation became notably worse compared to last Echo in 2019.   There does seem to be a slight worsening of exam.  I would not expect him to be the mitral valve that would become severe before the aortic valve.  There is appearance of possible rheumatic mitral disease versus even potentially ischemic.  Interestingly LVEF is well-preserved which would argue against ischemic.   She is relatively asymptomatic and therefore we are not rushed to proceed with valve repair unless there is signs of cord damage etc.  Because of some subtle abnormalities, the recommendation was TEE for better assessment.  Pending results of TEE will decide whether or not to proceed directly to referral for valve surgery.  We will  hold off on doing right left heart cath.  Discussed concerning symptoms, and she really is not having that much in the way of any exertional dyspnea or chest pain.  She is, however, somewhat sedentary.      Essential hypertension (Chronic)    Blood pressure still high today.  We have severe valvular disease, need to make sure that her pressures are improved.  She does have some mild edema. Plan: Add HCTZ 25 mg daily.  Continue valsartan.  Would not do beta-blocker for now his resting heart rate is 61.      Relevant Orders   EKG 12-Lead (Completed)   Heart palpitations (Chronic)    Relatively stable.  Avoiding AV nodal agents due to borderline resting bradycardia.       Other Visit Diagnoses    Rheumatic mitral regurgitation       Relevant Orders   EKG 12-Lead (Completed)   Basic metabolic panel   CBC      Basic procedure details along with risks, benefits, alternatives and indications of TEE responded patient and all questions were answered.   She agrees to proceed with TEE.   COVID-19 Education: The signs and symptoms of COVID-19 were discussed with the patient and how to seek care for testing (follow up with PCP or arrange E-visit).   The importance of social distancing and COVID-19 vaccination was discussed today. The patient is practicing social distancing & Masking.   I spent a total of 81minutes with  the patient spent in direct patient consultation.  Additional time spent with chart review  / charting (studies, outside notes, etc): 15 Total Time: 48 min   Current medicines are reviewed at length with the patient today.  (+/- concerns) n/a  This visit occurred during the SARS-CoV-2 public health emergency.  Safety protocols were in place, including screening questions prior to the visit, additional usage of staff PPE, and extensive cleaning of exam room while observing appropriate contact time as indicated for disinfecting solutions.  Notice: This dictation was  prepared with Dragon dictation along with smaller phrase technology. Any transcriptional errors that result from this process are unintentional and may not be corrected upon review.  Patient Instructions / Medication Changes & Studies & Tests Ordered   Patient Instructions  Medication Instructions:  NO CHANGES  *If you need a refill on your cardiac medications before your next appointment, please call your pharmacy*   Lab Work: CBC BMP COVID TESTING SEE ACCOMPANYING  INSTRUCTIONS   If you have labs (blood work) drawn today and your tests are completely normal, you will receive your results only by: [name] Kitchen MyChart Message (if you have MyChart) OR . A paper copy in the mail If you have any lab test that is abnormal or we need to change your treatment, we will call you to review the results.   Testing/Procedures: Your physician has requested that you have an TRANSESOPHAGEAL  echocardiogram. Echocardiography is a painless test that uses sound waves to create images of your heart. It provides your doctor with information about the size and shape of your heart and how well your heart's chambers and valves are working. This procedure takes approximately one hour. There are no restrictions for this procedure.    Follow-Up: At [facility], you and your health needs are our priority.  As part of our continuing mission to provide you with exceptional heart care, we have created designated Provider Care Teams.  These Care Teams include your primary Cardiologist (physician) and Advanced Practice Providers (APPs -  Physician Assistants and Nurse Practitioners) who all work together to provide you with the care you need, when you need it.     Your next appointment:   1 month(s)  The format for your next appointment:   In [place]  Provider:   Glenetta Hew, MD   Other Instructions   Dear Lisa Miles, You are scheduled for a TEE on  Wednesday Mar 13, 2020  with Dr. Audie Box.  Please arrive at  the [facility] (Main Entrance A) at [facility]: [place], [place] [address] at 8 am.   DIET: Nothing to eat or drink after midnight except a sip of water with medications (see medication instructions below)  Medication Instructions: No changes   Labs:  cbc, bmp  Nov 12,2021Come to the lab at [address] between the hours of 8:00 am and 4:00 pm. You do not have to be fasting.  You will need a COVID-19  Test on Mar 09, 2020 at 10:50 am 3 days  prior to your procedure. This is a Drive Up Visit at [address] [place], [place] [address]. Someone will direct you to the appropriate testing line. Stay in your car and someone will be with you shortly. You must have a responsible Crossin to drive you home and stay in the waiting area during your procedure. Failure to do so could result in cancellation.    Bring your insurance cards.  *  Special Note: Every effort is made to have your procedure done on time. Occasionally there are emergencies that occur at the hospital that may cause delays. Please be patient if a delay does occur.        Studies Ordered:   Orders Placed This Encounter  Procedures  . Basic metabolic panel  . CBC  . EKG 12-Lead     Glenetta Hew, M.D., M.S. Interventional Cardiologist   Pager # [phone] Phone # [phone] [address] [place], [place] [address]   Thank you for choosing Heartcare at [facility]!!

## 2020-02-24 ENCOUNTER — Encounter: Payer: Self-pay | Admitting: Cardiology

## 2020-02-25 ENCOUNTER — Encounter: Payer: Self-pay | Admitting: Cardiology

## 2020-02-25 NOTE — Assessment & Plan Note (Signed)
Relatively stable.  Avoiding AV nodal agents due to borderline resting bradycardia.

## 2020-02-25 NOTE — Assessment & Plan Note (Signed)
Blood pressure still high today.  We have severe valvular disease, need to make sure that her pressures are improved.  She does have some mild edema. Plan: Add HCTZ 25 mg daily.  Continue valsartan.  Would not do beta-blocker for now his resting heart rate is 61.

## 2020-02-25 NOTE — Assessment & Plan Note (Signed)
Interestingly, the mitral vegetation became notably worse compared to last Echo in 2019.   There does seem to be a slight worsening of exam.  I would not expect him to be the mitral valve that would become severe before the aortic valve.  There is appearance of possible rheumatic mitral disease versus even potentially ischemic.  Interestingly LVEF is well-preserved which would argue against ischemic.   She is relatively asymptomatic and therefore we are not rushed to proceed with valve repair unless there is signs of cord damage etc.  Because of some subtle abnormalities, the recommendation was TEE for better assessment.  Pending results of TEE will decide whether or not to proceed directly to referral for valve surgery.  We will hold off on doing right left heart cath.  Discussed concerning symptoms, and she really is not having that much in the way of any exertional dyspnea or chest pain.  She is, however, somewhat sedentary.

## 2020-02-25 NOTE — Assessment & Plan Note (Signed)
She has combination mild to moderate aortic stenosis as well as moderate regurgitation in the setting of mitral valve disease as well.  Plan is for TEE to evaluate mitral regurgitation we can also assess the aortic valve at the same time.  Based on results of TEE, may consider right and left heart catheterization and referral to CVTS.[name] Kitchen  Thankfully, she is not yet symptomatic aside some mild exertional dyspnea.

## 2020-02-25 NOTE — Assessment & Plan Note (Signed)
Last labs available are from March 2020, and have been followed by PCP. She should be due for getting labs checked soon of the labs were checked in June of this year.  If we were to go for cardiac catheterization, I would check a fasting lipid panel.

## 2020-02-26 ENCOUNTER — Telehealth: Payer: Self-pay | Admitting: Cardiovascular Disease

## 2020-02-26 NOTE — Telephone Encounter (Signed)
Please let her know it is okay to proceed with the Covid booster shot.  [place] T. Audie Box, [place]  [address] [place], [place] [address] [phone]  2:50 PM   Patient called and made aware of the above recommendations from Dr. Audie Box.

## 2020-02-26 NOTE — Telephone Encounter (Signed)
Pt called in and stated she has some procedures coming up, along with lab work and echo.  He would like to know If Dr Marisue Ivan suggest she go ahead and get the covid booster, her last covid shot was in march  2021  Best number [phone]

## 2020-02-26 NOTE — Telephone Encounter (Signed)
Please let her know it is okay to proceed with the Covid booster shot.  [place] T. Audie Box, [place]  [address], [place], [place] [address] [phone]  2:50 PM

## 2020-03-08 ENCOUNTER — Other Ambulatory Visit: Payer: Self-pay | Admitting: Family Medicine

## 2020-03-08 DIAGNOSIS — I340 Nonrheumatic mitral (valve) insufficiency: Secondary | ICD-10-CM | POA: Diagnosis not present

## 2020-03-08 LAB — CBC
Hematocrit: 40.5 % (ref 34.0–46.6)
Hemoglobin: 13.3 g/dL (ref 11.1–15.9)
MCH: 29.3 pg (ref 26.6–33.0)
MCHC: 32.8 g/dL (ref 31.5–35.7)
MCV: 89 fL (ref 79–97)
Platelets: 259 10*3/uL (ref 150–450)
RBC: 4.54 x10E6/uL (ref 3.77–5.28)
RDW: 12.9 % (ref 11.7–15.4)
WBC: 3.6 10*3/uL (ref 3.4–10.8)

## 2020-03-08 LAB — BASIC METABOLIC PANEL
BUN/Creatinine Ratio: 14 (ref 12–28)
BUN: 12 mg/dL (ref 8–27)
CO2: 25 mmol/L (ref 20–29)
Calcium: 9.5 mg/dL (ref 8.7–10.3)
Chloride: 102 mmol/L (ref 96–106)
Creatinine, Ser: 0.88 mg/dL (ref 0.57–1.00)
GFR calc Af Amer: 78 mL/min/{1.73_m2} (ref >59)
GFR calc non Af Amer: 67 mL/min/{1.73_m2} (ref >59)
Glucose: 86 mg/dL (ref 65–99)
Potassium: 4.3 mmol/L (ref 3.5–5.2)
Sodium: 137 mmol/L (ref 134–144)

## 2020-03-08 MED ORDER — ESOMEPRAZOLE MAGNESIUM 20 MG PO CPDR: 20.0000 mg | DELAYED_RELEASE_CAPSULE | Freq: Two times a day (BID) | ORAL | 3 refills | Status: DC

## 2020-03-08 NOTE — Telephone Encounter (Signed)
Medication: esomeprazole (NEXIUM) 20 MG capsule [[id number]]   Has the patient contacted their pharmacy? YES  (Agent: If no, request that the patient contact the pharmacy for the refill.) (Agent: If yes, when and what did the pharmacy advise?)  Preferred Pharmacy (with phone number or street name): [facility] [place], [place] AT [place]  [address] [place] [address]  Phone:  [phone] Fax:  [phone]   Agent: Please be advised that RX refills may take up to 3 business days. We ask that you follow-up with your pharmacy.

## 2020-03-08 NOTE — Telephone Encounter (Signed)
Requested medication (s) are due for refill today: yes  Requested medication (s) are on the active medication list: yes  Last refill:  ?  Future visit scheduled: no  Notes to clinic:  historical provider    Requested Prescriptions  Pending Prescriptions Disp Refills   esomeprazole (NEXIUM) 20 MG capsule      Sig: Take 1 capsule (20 mg total) by mouth 2 (two) times daily before a meal.      Gastroenterology: Proton Pump Inhibitors Passed - 03/08/2020  1:56 PM      Passed - Valid encounter within last 12 months    Recent Outpatient Visits           5 months ago Abdominal pain, unspecified abdominal location   [facility] [place], NP   7 months ago Urinary frequency   [facility] at [facility], Arlie Solomons, MD   8 months ago Frequent headaches   [facility] at [facility], Arlie Solomons, MD   1 year ago Need for influenza vaccination   [facility] at [facility], Lilia Argue, MD   1 year ago Medicare annual wellness visit, subsequent   [facility] at [place], MD       Future Appointments             In 1 week Leonie Man, MD [place] [facility], [facility]

## 2020-03-09 ENCOUNTER — Other Ambulatory Visit (HOSPITAL_COMMUNITY)
Admission: RE | Admit: 2020-03-09 | Discharge: 2020-03-09 | Disposition: A | Discharge status: Home / Self Care | Payer: PPO | Source: Ambulatory Visit | Attending: Cardiovascular Disease | Admitting: Cardiovascular Disease

## 2020-03-09 DIAGNOSIS — Z01812 Encounter for preprocedural laboratory examination: Secondary | ICD-10-CM | POA: Diagnosis not present

## 2020-03-09 DIAGNOSIS — Z20822 Contact with and (suspected) exposure to covid-19: Secondary | ICD-10-CM | POA: Insufficient documentation

## 2020-03-09 LAB — SARS CORONAVIRUS 2 (TAT 6-24 HRS): SARS Coronavirus 2: NEGATIVE

## 2020-03-11 ENCOUNTER — Encounter: Payer: Self-pay | Admitting: Registered Nurse

## 2020-03-11 ENCOUNTER — Other Ambulatory Visit: Payer: Self-pay

## 2020-03-11 ENCOUNTER — Telehealth (INDEPENDENT_AMBULATORY_CARE_PROVIDER_SITE_OTHER): Payer: PPO | Admitting: Registered Nurse

## 2020-03-11 VITALS — Ht 66.0 in | Wt 212.0 lb

## 2020-03-11 DIAGNOSIS — I10 Essential (primary) hypertension: Secondary | ICD-10-CM | POA: Diagnosis not present

## 2020-03-11 DIAGNOSIS — R002 Palpitations: Secondary | ICD-10-CM | POA: Diagnosis not present

## 2020-03-11 DIAGNOSIS — N3281 Overactive bladder: Secondary | ICD-10-CM

## 2020-03-11 DIAGNOSIS — E669 Obesity, unspecified: Secondary | ICD-10-CM | POA: Diagnosis not present

## 2020-03-11 DIAGNOSIS — E785 Hyperlipidemia, unspecified: Secondary | ICD-10-CM

## 2020-03-11 NOTE — Patient Instructions (Signed)
° ° ° °  If you have lab work done today you will be contacted with your lab results within the next 2 weeks.  If you have not heard from us then please contact us. The fastest way to get your results is to register for My Chart. ° ° °IF you received an x-ray today, you will receive an invoice from [facility]. Please contact [facility] at [phone] with questions or concerns regarding your invoice.  ° °IF you received labwork today, you will receive an invoice from [facility]. Please contact [facility] at [phone] with questions or concerns regarding your invoice.  ° °Our billing staff will not be able to assist you with questions regarding bills from these companies. ° °You will be contacted with the lab results as soon as they are available. The fastest way to get your results is to activate your My Chart account. Instructions are located on the last page of this paperwork. If you have not heard from us regarding the results in 2 weeks, please contact this office. °  ° ° ° °

## 2020-03-13 ENCOUNTER — Other Ambulatory Visit: Payer: Self-pay

## 2020-03-13 ENCOUNTER — Ambulatory Visit (HOSPITAL_BASED_OUTPATIENT_CLINIC_OR_DEPARTMENT_OTHER)
Admission: RE | Admit: 2020-03-13 | Discharge: 2020-03-13 | Disposition: A | Discharge status: Home / Self Care | Payer: PPO | Source: Ambulatory Visit | Attending: Cardiology | Admitting: Cardiology

## 2020-03-13 ENCOUNTER — Encounter (HOSPITAL_COMMUNITY): Payer: Self-pay | Admitting: Cardiovascular Disease

## 2020-03-13 ENCOUNTER — Encounter (HOSPITAL_COMMUNITY)
Admission: RE | Disposition: A | Discharge status: Home / Self Care | Payer: Self-pay | Source: Home / Self Care | Attending: Cardiovascular Disease

## 2020-03-13 ENCOUNTER — Ambulatory Visit (HOSPITAL_COMMUNITY): Payer: PPO | Admitting: Certified Registered Nurse Anesthetist

## 2020-03-13 ENCOUNTER — Ambulatory Visit (HOSPITAL_COMMUNITY)
Admission: RE | Admit: 2020-03-13 | Discharge: 2020-03-13 | Disposition: A | Discharge status: Home / Self Care | Payer: PPO | Attending: Cardiovascular Disease | Admitting: Cardiovascular Disease

## 2020-03-13 DIAGNOSIS — I340 Nonrheumatic mitral (valve) insufficiency: Secondary | ICD-10-CM | POA: Diagnosis not present

## 2020-03-13 DIAGNOSIS — I10 Essential (primary) hypertension: Secondary | ICD-10-CM | POA: Diagnosis not present

## 2020-03-13 DIAGNOSIS — I119 Hypertensive heart disease without heart failure: Secondary | ICD-10-CM | POA: Diagnosis not present

## 2020-03-13 DIAGNOSIS — Z7982 Long term (current) use of aspirin: Secondary | ICD-10-CM | POA: Diagnosis not present

## 2020-03-13 DIAGNOSIS — I080 Rheumatic disorders of both mitral and aortic valves: Secondary | ICD-10-CM | POA: Insufficient documentation

## 2020-03-13 DIAGNOSIS — E785 Hyperlipidemia, unspecified: Secondary | ICD-10-CM | POA: Diagnosis not present

## 2020-03-13 DIAGNOSIS — I350 Nonrheumatic aortic (valve) stenosis: Secondary | ICD-10-CM | POA: Diagnosis not present

## 2020-03-13 DIAGNOSIS — I351 Nonrheumatic aortic (valve) insufficiency: Secondary | ICD-10-CM

## 2020-03-13 DIAGNOSIS — I051 Rheumatic mitral insufficiency: Secondary | ICD-10-CM

## 2020-03-13 DIAGNOSIS — Z79899 Other long term (current) drug therapy: Secondary | ICD-10-CM | POA: Diagnosis not present

## 2020-03-13 DIAGNOSIS — G4733 Obstructive sleep apnea (adult) (pediatric): Secondary | ICD-10-CM | POA: Diagnosis not present

## 2020-03-13 HISTORY — PX: TEE WITHOUT CARDIOVERSION: SHX5443

## 2020-03-13 LAB — ECHO TEE
AR max vel: 1.73 cm2
AV Area VTI: 1.65 cm2
AV Area mean vel: 1.82 cm2
AV Mean grad: 16 mmHg
AV Peak grad: 27.9 mmHg
Ao pk vel: 2.64 m/s
MV M vel: 6.53 m/s
MV Peak grad: 170.6 mmHg
P 1/2 time: 731 msec
Radius: 0.9 cm

## 2020-03-13 SURGERY — ECHOCARDIOGRAM, TRANSESOPHAGEAL
Anesthesia: Monitor Anesthesia Care

## 2020-03-13 MED ORDER — PHENYLEPHRINE HCL (PRESSORS) 10 MG/ML IV SOLN
INTRAVENOUS | Status: DC | PRN
  Administered 2020-03-13: 80 ug via INTRAVENOUS
  Administered 2020-03-13 (×2): 120 ug via INTRAVENOUS
  Administered 2020-03-13: 80 ug via INTRAVENOUS

## 2020-03-13 MED ORDER — SODIUM CHLORIDE 0.9 % IV SOLN: INTRAVENOUS | Status: DC | PRN

## 2020-03-13 MED ORDER — PROPOFOL 10 MG/ML IV BOLUS
INTRAVENOUS | Status: DC | PRN
  Administered 2020-03-13: 10 mg via INTRAVENOUS
  Administered 2020-03-13: 20 mg via INTRAVENOUS

## 2020-03-13 MED ORDER — SODIUM CHLORIDE 0.9 % IV SOLN: INTRAVENOUS | Status: DC

## 2020-03-13 MED ORDER — MIDAZOLAM HCL 2 MG/2ML IJ SOLN
INTRAMUSCULAR | Status: DC | PRN
  Administered 2020-03-13: 2 mg via INTRAVENOUS

## 2020-03-13 MED ORDER — GLYCOPYRROLATE 0.2 MG/ML IJ SOLN
INTRAMUSCULAR | Status: DC | PRN
  Administered 2020-03-13 (×2): .1 mg via INTRAVENOUS

## 2020-03-13 MED ORDER — PROPOFOL 500 MG/50ML IV EMUL
INTRAVENOUS | Status: DC | PRN
  Administered 2020-03-13: 125 ug/kg/min via INTRAVENOUS

## 2020-03-13 MED ORDER — BUTAMBEN-TETRACAINE-BENZOCAINE 2-2-14 % EX AERO
INHALATION_SPRAY | CUTANEOUS | Status: DC | PRN
  Administered 2020-03-13: 2 via TOPICAL

## 2020-03-13 NOTE — Anesthesia Procedure Notes (Signed)
Procedure Name: MAC Date/Time: 03/13/2020 9:04 AM Performed by: Kathryne Hitch, CRNA Pre-anesthesia Checklist: Patient identified, Emergency Drugs available, Suction available and Patient being monitored Patient Re-evaluated:Patient Re-evaluated prior to induction Oxygen Delivery Method: Nasal cannula Preoxygenation: Pre-oxygenation with 100% oxygen Induction Type: IV induction Dental Injury: Teeth and Oropharynx as per pre-operative assessment

## 2020-03-13 NOTE — CV Procedure (Signed)
    TRANSESOPHAGEAL ECHOCARDIOGRAM   NAME:  Lisa Miles    MRN: [id number] DOB:  1950-11-09    ADMIT DATE: 03/13/2020  INDICATIONS: Severe MR.   PROCEDURE:   Informed consent was obtained prior to the procedure. The risks, benefits and alternatives for the procedure were discussed and the patient comprehended these risks.  Risks include, but are not limited to, cough, sore throat, vomiting, nausea, somnolence, esophageal and stomach trauma or perforation, bleeding, low blood pressure, aspiration, pneumonia, infection, trauma to the teeth and death.    Procedural time out performed. The oropharynx was anesthetized with topical 1% benzocaine.    Anesthesia was administered by Dr. Ermalene Postin.  The patient was administered a total of 463 mg propofol to achieve and maintain moderate conscious sedation.  The patient's heart rate, blood pressure, and oxygen saturation are monitored continuously during the procedure. The period of conscious sedation is 40 minutes, of which I was present face-to-face 100% of this time.   The transesophageal probe was inserted in the esophagus and stomach without difficulty and multiple views were obtained.   COMPLICATIONS:    There were no immediate complications.  KEY FINDINGS:  1. Severe mitral regurgitation due to restricted PMVL movement in systole (IIIB) 2. Moderate AI. 3. Mild to moderate AS. 4. Normal LVEF, 64%.  5. Full report to follow. 6. Further management per primary team.   Addison Naegeli. Audie Box, [place]  [address] [place], [place] [address] [phone]  10:08 AM

## 2020-03-13 NOTE — Interval H&P Note (Signed)
History and Physical Interval Note:  03/13/2020 8:41 AM  Lisa Miles  has presented today for surgery, with the diagnosis of [place].  The various methods of treatment have been discussed with the patient and family. After consideration of risks, benefits and other options for treatment, the patient has consented to  Procedure(s): TRANSESOPHAGEAL ECHOCARDIOGRAM (TEE) (N/A) as a surgical intervention.  The patient's history has been reviewed, patient examined, no change in status, stable for surgery.  I have reviewed the patient's chart and labs.  Questions were answered to the patient's satisfaction.     TEE for MR and AS/AI. Possibly rheumatic valvular heart disease links the two. NPO since midnight. No issues swallowing. No GI bleeding.   [place] T. Audie Box, [place]  [address] [place], [place] [address] [phone]  8:42 AM

## 2020-03-13 NOTE — Progress Notes (Signed)
  Echocardiogram Echocardiogram Transesophageal has been performed.  Lisa Miles 03/13/2020, 10:10 AM

## 2020-03-13 NOTE — Anesthesia Preprocedure Evaluation (Signed)
Anesthesia Evaluation  Patient identified by MRN, date of birth, ID band Patient awake    Reviewed: Allergy & Precautions, NPO status , Patient's Chart, lab work & pertinent test results  History of Anesthesia Complications Negative for: history of anesthetic complications  Airway Mallampati: II  TM Distance: >3 FB Neck ROM: Full    Dental  (+) Dental Advisory Given, Partial Upper,    Pulmonary asthma , sleep apnea ,    breath sounds clear to auscultation       Cardiovascular hypertension, Pt. on medications + Valvular Problems/Murmurs MR and AS  Rhythm:Regular + Systolic murmurs 1. Degenerative mitral valve disease with restricted PMVL movement in  systole (IIIB). Severe MR is present. 2D PISA radius 1.1 cm, ERO 0.44 cm2,  R vol 83 cc. There is systolic flow reversal in the right upper pulmonary  vein. BP during study 147/76. TEE  is recommended for further clarification. The mitral valve is  degenerative. Severe mitral valve regurgitation.  2. The aortic valve is tricuspid. There is moderate calcification of the  aortic valve. There is moderate thickening of the aortic valve. Aortic  valve regurgitation is moderate. Mild to moderate aortic valve stenosis.  Aortic valve area, by VTI measures  1.54 cm. Aortic valve mean gradient measures 15.0 mmHg. Aortic valve Vmax  measures 2.73 m/s.  3. Left ventricular ejection fraction, by estimation, is 65 to 70%. Left  ventricular ejection fraction by 3D volume is 69 %. The left ventricle has  normal function. The left ventricle has no regional wall motion  abnormalities. Left ventricular diastolic  function could not be evaluated. The average left ventricular global  longitudinal strain is -24.1 %. The global longitudinal strain is normal.  4. Right ventricular systolic function is normal. The right ventricular  size is normal. There is mildly elevated pulmonary artery systolic    pressure. The estimated right ventricular systolic pressure is 69.4 mmHg.  5. Left atrial size was severely dilated.  6. The inferior vena cava is normal in size with greater than 50%    Neuro/Psych negative neurological ROS  negative psych ROS   GI/Hepatic negative GI ROS, Neg liver ROS,   Endo/Other  negative endocrine ROS  Renal/GU negative Renal ROSLab Results      Component                Value               Date                      CREATININE               0.88                03/08/2020                Musculoskeletal  (+) Arthritis ,   Abdominal   Peds  Hematology negative hematology ROS (+)   Anesthesia Other Findings   Reproductive/Obstetrics                             Anesthesia Physical Anesthesia Plan  ASA: III  Anesthesia Plan: MAC   Post-op Pain Management:    Induction: Intravenous  PONV Risk Score and Plan: 2 and Propofol infusion and Treatment may vary due to age or medical condition  Airway Management Planned: Nasal Cannula  Additional Equipment: None  Intra-op Plan:   Post-operative Plan:  Informed Consent: I have reviewed the patients History and Physical, chart, labs and discussed the procedure including the risks, benefits and alternatives for the proposed anesthesia with the patient or authorized representative who has indicated his/her understanding and acceptance.     Dental advisory given  Plan Discussed with: CRNA and Surgeon  Anesthesia Plan Comments:         Anesthesia Quick Evaluation

## 2020-03-13 NOTE — Discharge Instructions (Signed)
Transesophageal Echocardiogram Transesophageal echocardiogram (TEE) is a test that uses sound waves to take pictures of your heart. TEE is done by passing a flexible tube down the esophagus. The esophagus is the tube that carries food from the throat to the stomach. The pictures give detailed images of your heart. This can help your doctor see if there are problems with your heart. What happens before the procedure? Staying hydrated Follow instructions from your doctor about hydration, which may include:  Up to 3 hours before the procedure - you may continue to drink clear liquids, such as: ? Water. ? Clear fruit juice. ? Black coffee. ? Plain tea.  Eating and drinking Follow instructions from your doctor about eating and drinking, which may include:  8 hours before the procedure - stop eating heavy meals or foods such as meat, fried foods, or fatty foods.  6 hours before the procedure - stop eating light meals or foods, such as toast or cereal.  6 hours before the procedure - stop drinking milk or drinks that contain milk.  3 hours before the procedure - stop drinking clear liquids. General instructions  You will need to take out any dentures or retainers.  Plan to have someone take you home from the hospital or clinic.  If you will be going home right after the procedure, plan to have someone with you for 24 hours.  Ask your doctor about: ? Changing or stopping your normal medicines. This is important if you take diabetes medicines or blood thinners. ? Taking over-the-counter medicines, vitamins, herbs, and supplements. ? Taking medicines such as aspirin and ibuprofen. These medicines can thin your blood. Do not take these medicines unless your doctor tells you to take them. What happens during the procedure?  To lower your risk of infection, your doctors will wash or clean their hands.  An IV will be put into one of your veins.  You will be given a medicine to help you  relax (sedative).  A medicine may be sprayed or gargled. This numbs the back of your throat.  Your blood pressure, heart rate, and breathing will be watched.  You may be asked to lay on your left side.  A bite block will be placed in your mouth. This keeps you from biting the tube.  The tip of the TEE probe will be placed into the back of your mouth.  You will be asked to swallow.  Your doctor will take pictures of your heart.  The probe and bite block will be taken out. The procedure may vary among doctors and hospitals. What happens after the procedure?   Your blood pressure, heart rate, breathing rate, and blood oxygen level will be watched until the medicines you were given have worn off.  When you first wake up, your throat may feel sore and numb. This will get better over time. You will not be allowed to eat or drink until the numbness has gone away.  Do not drive for 24 hours if you were given a medicine to help you relax. Summary  TEE is a test that uses sound waves to take pictures of your heart.  You will be given a medicine to help you relax.  Do not drive for 24 hours if you were given a medicine to help you relax. This information is not intended to replace advice given to you by your health care provider. Make sure you discuss any questions you have with your health care provider. Document Revised:   12/31/2017 Document Reviewed: 07/15/2016 Elsevier Patient Education  2020 [employer] Inc.  

## 2020-03-13 NOTE — Transfer of Care (Signed)
Immediate Anesthesia Transfer of Care Note  Patient: Lisa Miles  Procedure(s) Performed: TRANSESOPHAGEAL ECHOCARDIOGRAM (TEE) (N/A )  Patient Location: Endoscopy Unit  Anesthesia Type:MAC  Level of Consciousness: drowsy and patient cooperative  Airway & Oxygen Therapy: Patient Spontanous Breathing and Patient connected to nasal cannula oxygen  Post-op Assessment: Report given to RN and Post -op Vital signs reviewed and stable  Post vital signs: Reviewed and stable  Last Vitals:  Vitals Value Taken Time  BP    Temp    Pulse    Resp    SpO2      Last Pain:  Vitals:   03/13/20 0820  TempSrc: Oral  PainSc: 0-No pain         Complications: No complications documented.

## 2020-03-14 ENCOUNTER — Encounter (HOSPITAL_COMMUNITY): Payer: Self-pay | Admitting: Cardiovascular Disease

## 2020-03-15 ENCOUNTER — Encounter: Payer: Self-pay | Admitting: Cardiology

## 2020-03-15 ENCOUNTER — Ambulatory Visit (INDEPENDENT_AMBULATORY_CARE_PROVIDER_SITE_OTHER): Payer: PPO | Admitting: Cardiology

## 2020-03-15 VITALS — BP 145/73 | HR 61 | Temp 97.0°F | Ht 66.0 in | Wt 210.6 lb

## 2020-03-15 DIAGNOSIS — R002 Palpitations: Secondary | ICD-10-CM

## 2020-03-15 DIAGNOSIS — R600 Localized edema: Secondary | ICD-10-CM

## 2020-03-15 DIAGNOSIS — I340 Nonrheumatic mitral (valve) insufficiency: Secondary | ICD-10-CM | POA: Diagnosis not present

## 2020-03-15 DIAGNOSIS — I359 Nonrheumatic aortic valve disorder, unspecified: Secondary | ICD-10-CM | POA: Diagnosis not present

## 2020-03-15 DIAGNOSIS — I10 Essential (primary) hypertension: Secondary | ICD-10-CM | POA: Diagnosis not present

## 2020-03-15 NOTE — Patient Instructions (Addendum)
Medication Instructions:   No changes  *If you need a refill on your cardiac medications before your next appointment, please call your pharmacy*   Lab Work: No labs except Covid test on Nov 29 , 2021 at 2:20 pm If you have labs (blood work) drawn today and your tests are completely normal, you will receive your results only by: [name] Kitchen MyChart Message (if you have MyChart) OR . A paper copy in the mail If you have any lab test that is abnormal or we need to change your treatment, we will call you to review the results.   Testing/Procedures: Will be schedule at [address] - cath lab Your physician has requested that you have a cardiac catheterization. Cardiac catheterization is used to diagnose and/or treat various heart conditions. Doctors may recommend this procedure for a number of different reasons. The most common reason is to evaluate chest pain. Chest pain can be a symptom of coronary artery disease (CAD), and cardiac catheterization can show whether plaque is narrowing or blocking your heart's arteries. This procedure is also used to evaluate the valves, as well as measure the blood flow and oxygen levels in different parts of your heart. For further information please visit [email or web address]. Please follow instruction sheet, as given.   Follow-Up: At [facility], you and your health needs are our priority.  As part of our continuing mission to provide you with exceptional heart care, we have created designated Provider Care Teams.  These Care Teams include your primary Cardiologist (physician) and Advanced Practice Providers (APPs -  Physician Assistants and Nurse Practitioners) who all work together to provide you with the care you need, when you need it.     Your next appointment:      ~1-2   month  The format for your next appointment:   In [facility]  Provider:   Glenetta Hew, MD  You have been referred to  Thoracic surgeon to discuss mitral valve  issue      [address] [place] [place] [address] Dept: [phone] Loc: [place]  03/15/2020  You are scheduled for a Cardiac Catheterization on Thursday, December 2 with Dr. Glenetta Hew.  1. Please arrive at the [facility] (Main Entrance A) at [address] [facility]: [address] [place], [place] [address] at 5:30 AM (This time is two hours before your procedure to ensure your preparation). Free valet parking service is available.   Special note: Every effort is made to have your procedure done on time. Please understand that emergencies sometimes delay scheduled procedures.  2. Diet: Do not eat solid foods after midnight.  The patient may have clear liquids until 5am upon the day of the procedure.  3. Labs: You will not need to have blood drawn . You will need a COVID-19  test prior to your procedure. You are scheduled for Nov 29 ,2021  At  2:20 PM. This is a Drive Up Visit at [address] [place], [place] [address]. Someone will direct you to the appropriate testing line. Stay in your car and someone will be with you shortly.   4. Medication instructions in preparation for your procedure:   Contrast Allergy: No  Stop taking, Diovan (Valsartan) Thursday, December 2,morning of procedure only   On the morning of your procedure, take your Aspirin and any morning medicines NOT listed above.  You may use sips of water.  5. Plan for one night stay--bring personal belongings. 6. Bring a current list of your medications and current insurance cards. 7. You MUST have a responsible [place] to drive you home. 8. Someone MUST be with you the first 24 hours after you arrive home or your discharge will be delayed. 9. Please wear clothes that are easy to get on and off and wear slip-on shoes.  Thank you for allowing [place] to care for you!   -- [facility]

## 2020-03-15 NOTE — Progress Notes (Signed)
Primary Care Provider: Maximiano Coss, NP Cardiologist: Glenetta Hew, MD Electrophysiologist: None  Clinic Note: Chief Complaint  Patient presents with  . Follow-up    TEE results  . Mitral Regurgitation    Now moderate to severe   HPI:    Lisa Miles is a 69 y.o. female with a PMH notable for HYPERTENSIVE HEART DISEASE, SEVERE MITRAL REGURGITATION /MILD AS, who presents today for follow-up of Transesophageal Echocardiogram  Lisa Miles was last seen on Sep 19, 2019 for in [place] visit.  Lisa Miles really had no major complaints just Lisa Miles put all of Lisa weight because lack of exercise.  Therefore change Lisa diet.  Not getting out much because of Covid.  Notes intermittent irregular heartbeats off and on but nothing significant.  No angina or heart failure symptoms. -> Had pending sleep evaluation.  Recent Hospitalizations: None  Reviewed  CV studies:    The following studies were reviewed today: (if available, images/films reviewed: From Epic Chart or Care Everywhere) . TTE January 31, 2020: EF 65 to 70% with normal wall motion..  Severe MR with restricted (posterior mitral valve leaflet) PMVL movement.  (Recommend TEE).  Moderate aortic valve thickening with mild to moderate stenosis, and moderate aortic regurgitation.  Normal RV function.  Mildly elevated pressures.  Severe LA dilation.  o Compared to Last study, Mitral Vegetation Now Severe  TEE 03/13/2020: Severe MR due to restricted movement of P3 scallop of PMVL (IIIB) due to incomplete leaflet coaptation.  Thickened and degenerative leaflets consistent with Rheumatic Valvular Heart Disease.  2D PISA radiums 0.9 cm with 2D ERO 0.26 cm2 with R  vol 54 cm. 3D ERO 0.30 cm2 with R vol 62 cc. There is systolic blunting in  all pulmonary veins -> with systolic blood pressure of 109/60 mmHg.  No mitral stenosis.  Thickened aortic valve leaflets with some retraction, also likely related to rheumatic valvular disease.  Mild to moderate  AI/Mild AS.  Mildly dilated LAA.  No thrombus.  Grade 2 plaque involving transverse aorta.  Normal IVC pressures.  Interval History:   Lisa Miles is here for follow-up after Lisa TEE confirmed severe mitral regurgitation.  Discussing with imaging cardiologist from indicates that this would make recommendation for referral for definitive surgical repair/replacement of the mitral valve.  Because of the nature of this visit, Lisa Miles is accompanied by Lisa Miles.  Interestingly, Lisa Miles really does not notice significant dyspnea, just short little bursts of palpitations that lead to mild dyspnea, but they are not very long lasting.  Lisa Miles thinks that Lisa shortness of breath is related nasal congestion.  No real PND orthopnea.  Lisa Miles has right greater than left LE edema.  But this has been Lisa baseline for a while.  No overt CHF symptoms. No chest comfort or chest pain/pressure rest exertion.  No real exertional dyspnea, however Lisa Miles really does not do much exertion.  CV Review of Symptoms (Summary): positive for - Mild DOE with more than usual exertion;, mild end of day edema and occasional rapid heart rates. negative for - chest pain, irregular heartbeat, orthopnea, paroxysmal nocturnal dyspnea, shortness of breath or Syncope/near syncope, TIA/amaurosis fugax,  claudication  The patient does not have symptoms concerning for COVID-19 infection (fever, chills, cough, or new shortness of breath).   REVIEWED OF SYSTEMS   Review of Systems  Constitutional: Negative for malaise/fatigue (Just not much energy) and weight loss (Lisa weight is down a little bit, but not necessarily intentional).  HENT: Positive  for congestion (Sinus congestion with occasional sore throat).   Respiratory: Negative for shortness of breath (Lisa Miles feels is due to Lisa congestion.).   Cardiovascular: Positive for leg swelling (Unilateral as noted).  Gastrointestinal: Negative for blood in stool and melena.  Genitourinary: Negative for  hematuria.  Musculoskeletal: Negative for falls and joint pain.  Neurological: Negative for dizziness, focal weakness and weakness.  Psychiatric/Behavioral: Negative for memory loss. The patient is not nervous/anxious and does not have insomnia.    I have reviewed and (if needed) personally updated the patient's problem list, medications, allergies, past medical and surgical history, social and family history.   PAST MEDICAL HISTORY   Past Medical History:  Diagnosis Date  . Aortic stenosis, mild-moderate 07/2012   TTE October 21: Moderate aortic valve thickening with mild to moderate AI & mild AS; TEE: mild to moderate AI & MIld AS  . Arthritis    Phreesia 11/06/2019  . Asthma   . Bilateral bunions    Bunionectomies performed  . Bronchitis, chronic ([place])   . Cataract    Phreesia 11/06/2019  . Heart disease   . Hyperlipidemia   . Hypertension    Good control  . Inguinodynia    Bilateral groin pain  . Lower extremity edema    Chronic. Venous Duplex 11/06/11 SUMMARY: 1) Bilateral Lower Extremities: No evidence of DVT or thrombophlebitis.  2) Right Common Femoral Vein: Demonstrated mild valvular insufficiency with a greater than (1) sec of duration. Mildly abnormal LE Venous duplex Doppler.  . Obesity   . Palpitations    Relatively well controlled  . Scoliosis    DG Chest 2 View x-ray on 07/30/11 by Dr. Everlene Farrier shows a scoliosis.  . Severe mitral regurgitation by prior echocardiogram 07/2012   Mild AMVL prolapse, Mod MR -- no MVP noted in 01/2018 -> 11/'21: Severe MR due to restricted movement of P3 scallop of PMVL (IIIB) due to incomplete leaflet coaptation -> thickened leaflets consistent with Rheumatic Heart Valve Disease.    PAST SURGICAL HISTORY   Past Surgical History:  Procedure Laterality Date  . ABDOMINAL HYSTERECTOMY    . ANKLE ARTHROSCOPY Left   . BUNIONECTOMY Bilateral   . EYE SURGERY N/A    Phreesia 11/06/2019  . KNEE ARTHROSCOPY Right   . Left knee open surgery       pt thinks Lisa Miles only had shots in L knee, not surgery  . TEE WITHOUT CARDIOVERSION N/A 03/13/2020   Procedure: TRANSESOPHAGEAL ECHOCARDIOGRAM (TEE);  Surgeon: Geralynn Rile, MD;  [facility] ENDOSCOPY;;; Severe MR 2/2 restricted P3 scallop of PMVL (IIIB) w/ incomplete leaflet coaptation.  Thickened /degenerative leaflets c/w Rheumatic Valvular Heart Disease.  2D PISA radius 0.9 cm with 2D ERO 0.26 cm2 with R Vol 62 cc. PV blunting w/ BP 109/60 mmHg. No MS. Mild-mod AI / Mild AS. Gr 2 plaque Aorta  . TOTAL ABDOMINAL HYSTERECTOMY    . TRANSTHORACIC ECHOCARDIOGRAM  07/2012; 01/2013   a) 4/'14: mild-mod AS, Mod AI (mean/peak gradient: 18 mmHg / 31 mmhg).  Mild MVP w/ Mod MR, no MS.  Mildly elevated PAP (~37 mmHg) w/ Mod TR. EF 55-60%. GR 1 DD.;; b). 01/2013: EF 60-65%. No RWMA. Gr  DD. Mild AS (M / P Gradient 20 mmHg/34 mmHg). Mild AMVL Prolapse w/ Mod MR. Mod LA dilation. Mod TR.  [name] Kitchen TRANSTHORACIC ECHOCARDIOGRAM  01/2015; 01/2016   a) Normal LV size and function. GR 1 DD. Moderate LA Dilation. Calcified aortic valve with mild AS, moderate (2+) AI. Restricted  posterior MV leaflet movement w/ Mod MR. Mode TR with mod elevated PA pressures (45 mmHg);; b) Relatively stable. Moderate concentric LVH. Moderate AI with mild AS (Mean-PeaK Gradient: 19 / 36 mmHg). Mild MR. Mod LA dilation. PAP ~40 mmHg -> plan follow-up 2020.  [name] Kitchen TRANSTHORACIC ECHOCARDIOGRAM  01/2018   Normal LV size.  Moderate concentric LVH.  EF 60 to 65%.  No R WMA.  GR 1 DD (high filling pressures).  Mild AS (mean gradient 16 mmHg) with moderate AI.  Severe LA dilation.  Mild MR.  . TRANSTHORACIC ECHOCARDIOGRAM  01/31/2020    EF 65 to 70% with normal wall motion..  Severe MR with restricted PMVL movement.  (Recommend TEE).  Moderate aortic valve thickening with mild to moderate stenosis, and moderate aortic regurgitation.  Normal RV function.  Mildly elevated pressures.  Severe LA dilation.     Immunization History  Administered Date(s)  Administered  . Fluad Quad(high Dose 65+) 01/06/2019  . Influenza Whole 03/27/2009  . Influenza, High Dose Seasonal PF 04/01/2018  . Influenza,inj,Quad PF,6+ Mos 03/15/2012, 03/28/2013, 07/22/2016  . Moderna SARS-COVID-2 Vaccination 06/10/2019, 07/08/2019  . Pneumococcal Conjugate-13 07/22/2016  . Pneumococcal Polysaccharide-23 04/02/2009, 09/22/2018  . Tdap 03/28/2013    MEDICATIONS/ALLERGIES   Current Meds  Medication Sig  . aspirin 81 MG tablet Take 81 mg by mouth daily.   . Calcium Carbonate+Vitamin D (CALCIUM 600+D3) 600-200 MG-UNIT TABS Take 2 tablets by mouth daily.  . Cholecalciferol (VITAMIN D) 50 MCG (2000 UT) CAPS Take 2,000 Units by mouth daily.  [name] Kitchen esomeprazole (NEXIUM) 20 MG capsule Take 1 capsule (20 mg total) by mouth 2 (two) times daily before a meal.  . estradiol (ESTRACE) 2 MG tablet Take 2 mg by mouth daily.  . valsartan (DIOVAN) 320 MG tablet Take 1 tablet by mouth once daily (Patient taking differently: Take 320 mg by mouth daily. )    No Known Allergies  SOCIAL HISTORY/FAMILY HISTORY   Reviewed in Epic:  Pertinent findings: No changes  OBJCTIVE -PE, EKG, labs   Wt Readings from Last 3 Encounters:  03/15/20 210 lb 9.6 oz (95.5 kg)  03/13/20 212 lb (96.2 kg)  03/11/20 212 lb (96.2 kg)    Physical Exam: BP (!) 145/73   Pulse 61   Temp (!) 97 F (36.1 C)   Ht 5\' 6"  (1.676 m)   Wt 210 lb 9.6 oz (95.5 kg)   SpO2 99%   BMI 33.99 kg/m  Physical Exam Vitals reviewed.  Constitutional:      General: Lisa Miles is not in acute distress.    Appearance: Normal appearance. Lisa Miles is obese. Lisa Miles is not ill-appearing or toxic-appearing.     Comments: Remarkably healthy appearing woman.  Well-groomed.  HENT:     Head: Normocephalic and atraumatic.  Neck:     Vascular: No carotid bruit (Radiated aortic murmur), hepatojugular reflux or JVD.  Cardiovascular:     Rate and Rhythm: Normal rate and regular rhythm. Occasional extrasystoles are present.    Chest Wall: PMI  is not displaced.     Pulses: Intact distal pulses.     Heart sounds: Murmur heard. High-pitched harsh crescendo-decrescendo mid to late systolic murmur is present with a grade of 2/6 at the upper right sternal border radiating to the neck. High-pitched blowing holosystolic murmur of grade 3/6 is also present at the lower left sternal border and apex radiating to the axilla and back. High-pitched blowing decrescendo early diastolic murmur is present with a grade of 1/4 at the  upper right sternal border radiating to the apex.  No friction rub.  Pulmonary:     Effort: Pulmonary effort is normal. No respiratory distress.     Breath sounds: Normal breath sounds.  Chest:     Chest wall: No tenderness.  Abdominal:     General: Bowel sounds are normal. There is distension.     Palpations: Abdomen is soft. There is no mass (No HSM).  Musculoskeletal:        General: Swelling (Trivial-1+ bilateral LE) present. Normal range of motion.     Cervical back: Normal range of motion and neck supple.  Lymphadenopathy:     Cervical: No cervical adenopathy.  Neurological:     General: No focal deficit present.     Mental Status: Lisa Miles is alert and oriented to [place], place, and time.  Psychiatric:        Mood and Affect: Mood normal.        Behavior: Behavior normal.        Thought Content: Thought content normal.        Judgment: Judgment normal.     Adult ECG Report  Rate: 61 ;  Rhythm: normal sinus rhythm and LVH otherwise normal axis, intervals and durations.;   Narrative Interpretation: Stable EKG  Recent Labs:    Lab Results  Component Value Date   CHOL 180 07/06/2018   HDL 68 07/06/2018   LDLCALC 91 07/06/2018   TRIG 104 07/06/2018   CHOLHDL 2.6 07/06/2018   Lab Results  Component Value Date   CREATININE 0.88 03/08/2020   BUN 12 03/08/2020   NA 137 03/08/2020   K 4.3 03/08/2020   CL 102 03/08/2020   CO2 25 03/08/2020   Lab Results  Component Value Date   TSH 1.770 08/09/2019     ASSESSMENT/PLAN    Problem List Items Addressed This Visit    Aortic valve disorder (Chronic)    TTE would suggest the aortic valve disease is mild-mild to moderate in nature.  I am not sure that Lisa Miles would require double valve surgery, but would defer to Dr. Roxy Manns with his initial consultation.      Relevant Orders   Ambulatory referral to Cardiothoracic Surgery   Severe mitral regurgitation - Primary (Chronic)    Unexpectedly, Lisa mitral valve regurgitation sees Lisa progress faster than Lisa aortic valve disease.  Now very severe MR with restricted P3 leaflet of the posterior mitral leaflet.  Given the severity of mitral valve disease, I think it is prudent to proceed with surgical consultation.  As part of the preop evaluation Lisa Miles will need right heart catheterization which we discussed today in detail.  See below for consent discussion.  Explained to Lisa the pathophysiology of mitral vegetation what it means for sure outcome if nothing is done.  Lisa Miles asked about medical options and I just do not think there are any viable options to improve this regurgitation.  Longer goes on, where Lisa Miles will be.  I did indicate that I would prefer for many of the procedural type questions to be answered by Dr. Roxy Manns who said Lisa Miles was seen in consultation following catheterization.  -> We did send a referral request in the Dr. Roxy Manns, and the recommendation now is to actually also check a 30-day event monitor to exclude A. fib given Lisa palpitation symptoms and the dilated left atrial appendage.      Relevant Orders   Ambulatory referral to Cardiothoracic Surgery   Essential hypertension (Chronic)  Blood pressure remains high today.  I would like to see what Lisa pressures look like in the Cath Lab.  During Lisa last visit, we plan to start HCTZ, but apparently that was not ordered.      Lower extremity edema (Chronic)    Consideration would be to use HCTZ or chlorthalidone but not a true loop  diuretic.  Otherwise recommend foot elevation and support stockings.      Heart palpitations (Chronic)    Lisa Miles has palpitations although they do not seem to be very long.  No beta-blocker because of resting bradycardia.  After discussion with Dr. Roxy Manns (shortly following the in[place] portion of the visit), he recommended that we will check a 30-day event monitor to exclude A. fib given the dilation of left atrial appendage.         Performing MD:  Glenetta Hew, M.D., M.S.  Procedure: Right and Left Heart Catheterization with Coronary Angiography  The procedure with Risks/Benefits/Alternatives and Indications was reviewed with the patient and Lisa Miles.  All questions were answered.    Risks / Complications include, but not limited to: Death, MI, CVA/TIA, VF/VT (with defibrillation), Bradycardia (need for temporary pacer placement), contrast induced nephropathy, bleeding / bruising / hematoma / pseudoaneurysm, vascular or coronary injury (with possible emergent CT or Vascular Surgery), adverse medication reactions, infection.  Additional risks involving the use of radiation with the possibility of radiation burns and cancer were explained in detail.  The patient and Miles voice understanding and agree to proceed.     COVID-19 Education: The signs and symptoms of COVID-19 were discussed with the patient and how to seek care for testing (follow up with PCP or arrange E-visit).   The importance of social distancing and COVID-19 vaccination was discussed today. The patient is practicing social distancing & Masking.   I spent a total of 72minutes with the patient spent in direct patient consultation.  Additional time spent with chart review  / charting (studies, outside notes, etc): 12 Total Time: 43 min   Current medicines are reviewed at length with the patient today.  (+/- concerns) N/A  This visit occurred during the SARS-CoV-2 public health emergency.  Safety protocols were in  place, including screening questions prior to the visit, additional usage of staff PPE, and extensive cleaning of exam room while observing appropriate contact time as indicated for disinfecting solutions.  Notice: This dictation was prepared with Dragon dictation along with smaller phrase technology. Any transcriptional errors that result from this process are unintentional and may not be corrected upon review.  Patient Instructions / Medication Changes & Studies & Tests Ordered   Patient Instructions  Medication Instructions:   No changes  *If you need a refill on your cardiac medications before your next appointment, please call your pharmacy*   Lab Work: No labs except Covid test on Nov 29 , 2021 at 2:20 pm If you have labs (blood work) drawn today and your tests are completely normal, you will receive your results only by: [name] Kitchen MyChart Message (if you have MyChart) OR . A paper copy in the mail If you have any lab test that is abnormal or we need to change your treatment, we will call you to review the results.   Testing/Procedures: Will be schedule at [address] - cath lab Your physician has requested that you have a cardiac catheterization. Cardiac catheterization is used to diagnose and/or treat various heart conditions. Doctors may recommend this procedure for a number of  different reasons. The most common reason is to evaluate chest pain. Chest pain can be a symptom of coronary artery disease (CAD), and cardiac catheterization can show whether plaque is narrowing or blocking your heart's arteries. This procedure is also used to evaluate the valves, as well as measure the blood flow and oxygen levels in different parts of your heart. For further information please visit [email or web address][email or web address]. Please follow instruction sheet, as given.   Follow-Up: At [facility], you and your health needs are our priority.  As part of our continuing mission to provide you with  exceptional heart care, we have created designated Provider Care Teams.  These Care Teams include your primary Cardiologist (physician) and Advanced Practice Providers (APPs -  Physician Assistants and Nurse Practitioners) who all work together to provide you with the care you need, when you need it.     Your next appointment:      ~1-2   month  The format for your next appointment:   In [place]  Provider:   Glenetta Hew, MD  You have been referred to  Thoracic surgeon to discuss mitral valve issue      [place] [address] Dept: [phone] Loc: [place]  03/15/2020  You are scheduled for a Cardiac Catheterization on Thursday, December 2 with Dr. Glenetta Hew.  1. Please arrive at the [facility] (Main Entrance A) at [facility]: [address] [place], [place] [address] at 5:30 AM (This time is two hours before your procedure to ensure your preparation). Free valet parking service is available.   Special note: Every effort is made to have your procedure done on time. Please understand that emergencies sometimes delay scheduled procedures.  2. Diet: Do not eat solid foods after midnight.  The patient may have clear liquids until 5am upon the day of the procedure.  3. Labs: You will not need to have blood drawn . You will need a COVID-19  test prior to your procedure. You are scheduled for Nov 29 ,2021  At  2:20 PM. This is a Drive Up Visit at [address] [place], [place] [address]. Someone will direct you to the appropriate testing line. Stay in your car and someone will be with you shortly.   4. Medication instructions in preparation for your procedure:   Contrast Allergy: No  Stop taking, Diovan (Valsartan) Thursday, December 2,morning of procedure only   On the morning of your procedure, take your Aspirin and any morning  medicines NOT listed above.  You may use sips of water.  5. Plan for one night stay--bring personal belongings. 6. Bring a current list of your medications and current insurance cards. 7. You MUST have a responsible [place] to drive you home. 8. Someone MUST be with you the first 24 hours after you arrive home or your discharge will be delayed. 9. Please wear clothes that are easy to get on and off and wear slip-on shoes.  Thank you for allowing [place] to care for you!   -- [facility]      Studies Ordered:   Orders Placed This Encounter  Procedures  . Ambulatory referral to Cardiothoracic Surgery     Glenetta Hew, M.D., M.S. Interventional Cardiologist   Pager # [phone] Phone # [phone] [address] [place], [place] [address]   Thank you for choosing Heartcare at [facility]!!

## 2020-03-15 NOTE — H&P (View-Only) (Signed)
Primary Care Provider: Maximiano Coss, NP Cardiologist: Glenetta Hew, MD Electrophysiologist: None  Clinic Note: Chief Complaint  Patient presents with  . Follow-up    TEE results  . Mitral Regurgitation    Now moderate to severe   HPI:    Lisa Miles is a 69 y.o. female with a PMH notable for HYPERTENSIVE HEART DISEASE, SEVERE MITRAL REGURGITATION /MILD AS, who presents today for follow-up of Transesophageal Echocardiogram  Lisa Miles was last seen on Sep 19, 2019 for in [place] visit.  She really had no major complaints just she put all of her weight because lack of exercise.  Therefore change her diet.  Not getting out much because of Covid.  Notes intermittent irregular heartbeats off and on but nothing significant.  No angina or heart failure symptoms. -> Had pending sleep evaluation.  Recent Hospitalizations: None  Reviewed  CV studies:    The following studies were reviewed today: (if available, images/films reviewed: From Epic Chart or Care Everywhere) . TTE January 31, 2020: EF 65 to 70% with normal wall motion..  Severe MR with restricted (posterior mitral valve leaflet) PMVL movement.  (Recommend TEE).  Moderate aortic valve thickening with mild to moderate stenosis, and moderate aortic regurgitation.  Normal RV function.  Mildly elevated pressures.  Severe LA dilation.  o Compared to Last study, Mitral Vegetation Now Severe  TEE 03/13/2020: Severe MR due to restricted movement of P3 scallop of PMVL (IIIB) due to incomplete leaflet coaptation.  Thickened and degenerative leaflets consistent with Rheumatic Valvular Heart Disease.  2D PISA radiums 0.9 cm with 2D ERO 0.26 cm2 with R  vol 54 cm. 3D ERO 0.30 cm2 with R vol 62 cc. There is systolic blunting in  all pulmonary veins -> with systolic blood pressure of 109/60 mmHg.  No mitral stenosis.  Thickened aortic valve leaflets with some retraction, also likely related to rheumatic valvular disease.  Mild to moderate  AI/Mild AS.  Mildly dilated LAA.  No thrombus.  Grade 2 plaque involving transverse aorta.  Normal IVC pressures.  Interval History:   Lisa Miles is here for follow-up after her TEE confirmed severe mitral regurgitation.  Discussing with imaging cardiologist from indicates that this would make recommendation for referral for definitive surgical repair/replacement of the mitral valve.  Because of the nature of this visit, she is accompanied by her husband.  Interestingly, Dezarae really does not notice significant dyspnea, just short little bursts of palpitations that lead to mild dyspnea, but they are not very long lasting.  She thinks that her shortness of breath is related nasal congestion.  No real PND orthopnea.  She has right greater than left LE edema.  But this has been her baseline for a while.  No overt CHF symptoms. No chest comfort or chest pain/pressure rest exertion.  No real exertional dyspnea, however she really does not do much exertion.  CV Review of Symptoms (Summary): positive for - Mild DOE with more than usual exertion;, mild end of day edema and occasional rapid heart rates. negative for - chest pain, irregular heartbeat, orthopnea, paroxysmal nocturnal dyspnea, shortness of breath or Syncope/near syncope, TIA/amaurosis fugax,  claudication  The patient does not have symptoms concerning for COVID-19 infection (fever, chills, cough, or new shortness of breath).   REVIEWED OF SYSTEMS   Review of Systems  Constitutional: Negative for malaise/fatigue (Just not much energy) and weight loss (Her weight is down a little bit, but not necessarily intentional).  HENT: Positive  for congestion (Sinus congestion with occasional sore throat).   Respiratory: Negative for shortness of breath (She feels is due to her congestion.).   Cardiovascular: Positive for leg swelling (Unilateral as noted).  Gastrointestinal: Negative for blood in stool and melena.  Genitourinary: Negative for  hematuria.  Musculoskeletal: Negative for falls and joint pain.  Neurological: Negative for dizziness, focal weakness and weakness.  Psychiatric/Behavioral: Negative for memory loss. The patient is not nervous/anxious and does not have insomnia.    I have reviewed and (if needed) personally updated the patient's problem list, medications, allergies, past medical and surgical history, social and family history.   PAST MEDICAL HISTORY   Past Medical History:  Diagnosis Date  . Aortic stenosis, mild-moderate 07/2012   TTE October 21: Moderate aortic valve thickening with mild to moderate AI & mild AS; TEE: mild to moderate AI & MIld AS  . Arthritis    Phreesia 11/06/2019  . Asthma   . Bilateral bunions    Bunionectomies performed  . Bronchitis, chronic ([place])   . Cataract    Phreesia 11/06/2019  . Heart disease   . Hyperlipidemia   . Hypertension    Good control  . Inguinodynia    Bilateral groin pain  . Lower extremity edema    Chronic. Venous Duplex 11/06/11 SUMMARY: 1) Bilateral Lower Extremities: No evidence of DVT or thrombophlebitis.  2) Right Common Femoral Vein: Demonstrated mild valvular insufficiency with a greater than (1) sec of duration. Mildly abnormal LE Venous duplex Doppler.  . Obesity   . Palpitations    Relatively well controlled  . Scoliosis    DG Chest 2 View x-ray on 07/30/11 by Dr. Everlene Farrier shows a scoliosis.  . Severe mitral regurgitation by prior echocardiogram 07/2012   Mild AMVL prolapse, Mod MR -- no MVP noted in 01/2018 -> 11/'21: Severe MR due to restricted movement of P3 scallop of PMVL (IIIB) due to incomplete leaflet coaptation -> thickened leaflets consistent with Rheumatic Heart Valve Disease.    PAST SURGICAL HISTORY   Past Surgical History:  Procedure Laterality Date  . ABDOMINAL HYSTERECTOMY    . ANKLE ARTHROSCOPY Left   . BUNIONECTOMY Bilateral   . EYE SURGERY N/A    Phreesia 11/06/2019  . KNEE ARTHROSCOPY Right   . Left knee open surgery       pt thinks she only had shots in L knee, not surgery  . TEE WITHOUT CARDIOVERSION N/A 03/13/2020   Procedure: TRANSESOPHAGEAL ECHOCARDIOGRAM (TEE);  Surgeon: Geralynn Rile, MD;  [facility] ENDOSCOPY;;; Severe MR 2/2 restricted P3 scallop of PMVL (IIIB) w/ incomplete leaflet coaptation.  Thickened /degenerative leaflets c/w Rheumatic Valvular Heart Disease.  2D PISA radius 0.9 cm with 2D ERO 0.26 cm2 with R Vol 62 cc. PV blunting w/ BP 109/60 mmHg. No MS. Mild-mod AI / Mild AS. Gr 2 plaque Aorta  . TOTAL ABDOMINAL HYSTERECTOMY    . TRANSTHORACIC ECHOCARDIOGRAM  07/2012; 01/2013   a) 4/'14: mild-mod AS, Mod AI (mean/peak gradient: 18 mmHg / 31 mmhg).  Mild MVP w/ Mod MR, no MS.  Mildly elevated PAP (~37 mmHg) w/ Mod TR. EF 55-60%. GR 1 DD.;; b). 01/2013: EF 60-65%. No RWMA. Gr  DD. Mild AS (M / P Gradient 20 mmHg/34 mmHg). Mild AMVL Prolapse w/ Mod MR. Mod LA dilation. Mod TR.  [name] Kitchen TRANSTHORACIC ECHOCARDIOGRAM  01/2015; 01/2016   a) Normal LV size and function. GR 1 DD. Moderate LA Dilation. Calcified aortic valve with mild AS, moderate (2+) AI. Restricted  posterior MV leaflet movement w/ Mod MR. Mode TR with mod elevated PA pressures (45 mmHg);; b) Relatively stable. Moderate concentric LVH. Moderate AI with mild AS (Mean-PeaK Gradient: 19 / 36 mmHg). Mild MR. Mod LA dilation. PAP ~40 mmHg -> plan follow-up 2020.  [name] Kitchen TRANSTHORACIC ECHOCARDIOGRAM  01/2018   Normal LV size.  Moderate concentric LVH.  EF 60 to 65%.  No R WMA.  GR 1 DD (high filling pressures).  Mild AS (mean gradient 16 mmHg) with moderate AI.  Severe LA dilation.  Mild MR.  . TRANSTHORACIC ECHOCARDIOGRAM  01/31/2020    EF 65 to 70% with normal wall motion..  Severe MR with restricted PMVL movement.  (Recommend TEE).  Moderate aortic valve thickening with mild to moderate stenosis, and moderate aortic regurgitation.  Normal RV function.  Mildly elevated pressures.  Severe LA dilation.     Immunization History  Administered Date(s)  Administered  . Fluad Quad(high Dose 65+) 01/06/2019  . Influenza Whole 03/27/2009  . Influenza, High Dose Seasonal PF 04/01/2018  . Influenza,inj,Quad PF,6+ Mos 03/15/2012, 03/28/2013, 07/22/2016  . Moderna SARS-COVID-2 Vaccination 06/10/2019, 07/08/2019  . Pneumococcal Conjugate-13 07/22/2016  . Pneumococcal Polysaccharide-23 04/02/2009, 09/22/2018  . Tdap 03/28/2013    MEDICATIONS/ALLERGIES   Current Meds  Medication Sig  . aspirin 81 MG tablet Take 81 mg by mouth daily.   . Calcium Carbonate+Vitamin D (CALCIUM 600+D3) 600-200 MG-UNIT TABS Take 2 tablets by mouth daily.  . Cholecalciferol (VITAMIN D) 50 MCG (2000 UT) CAPS Take 2,000 Units by mouth daily.  [name] Kitchen esomeprazole (NEXIUM) 20 MG capsule Take 1 capsule (20 mg total) by mouth 2 (two) times daily before a meal.  . estradiol (ESTRACE) 2 MG tablet Take 2 mg by mouth daily.  . valsartan (DIOVAN) 320 MG tablet Take 1 tablet by mouth once daily (Patient taking differently: Take 320 mg by mouth daily. )    No Known Allergies  SOCIAL HISTORY/FAMILY HISTORY   Reviewed in Epic:  Pertinent findings: No changes  OBJCTIVE -PE, EKG, labs   Wt Readings from Last 3 Encounters:  03/15/20 210 lb 9.6 oz (95.5 kg)  03/13/20 212 lb (96.2 kg)  03/11/20 212 lb (96.2 kg)    Physical Exam: BP (!) 145/73   Pulse 61   Temp (!) 97 F (36.1 C)   Ht 5\' 6"  (1.676 m)   Wt 210 lb 9.6 oz (95.5 kg)   SpO2 99%   BMI 33.99 kg/m  Physical Exam Vitals reviewed.  Constitutional:      General: She is not in acute distress.    Appearance: Normal appearance. She is obese. She is not ill-appearing or toxic-appearing.     Comments: Remarkably healthy appearing woman.  Well-groomed.  HENT:     Head: Normocephalic and atraumatic.  Neck:     Vascular: No carotid bruit (Radiated aortic murmur), hepatojugular reflux or JVD.  Cardiovascular:     Rate and Rhythm: Normal rate and regular rhythm. Occasional extrasystoles are present.    Chest Wall: PMI  is not displaced.     Pulses: Intact distal pulses.     Heart sounds: Murmur heard. High-pitched harsh crescendo-decrescendo mid to late systolic murmur is present with a grade of 2/6 at the upper right sternal border radiating to the neck. High-pitched blowing holosystolic murmur of grade 3/6 is also present at the lower left sternal border and apex radiating to the axilla and back. High-pitched blowing decrescendo early diastolic murmur is present with a grade of 1/4 at the  upper right sternal border radiating to the apex.  No friction rub.  Pulmonary:     Effort: Pulmonary effort is normal. No respiratory distress.     Breath sounds: Normal breath sounds.  Chest:     Chest wall: No tenderness.  Abdominal:     General: Bowel sounds are normal. There is distension.     Palpations: Abdomen is soft. There is no mass (No HSM).  Musculoskeletal:        General: Swelling (Trivial-1+ bilateral LE) present. Normal range of motion.     Cervical back: Normal range of motion and neck supple.  Lymphadenopathy:     Cervical: No cervical adenopathy.  Neurological:     General: No focal deficit present.     Mental Status: She is alert and oriented to [place], place, and time.  Psychiatric:        Mood and Affect: Mood normal.        Behavior: Behavior normal.        Thought Content: Thought content normal.        Judgment: Judgment normal.     Adult ECG Report  Rate: 61 ;  Rhythm: normal sinus rhythm and LVH otherwise normal axis, intervals and durations.;   Narrative Interpretation: Stable EKG  Recent Labs:    Lab Results  Component Value Date   CHOL 180 07/06/2018   HDL 68 07/06/2018   LDLCALC 91 07/06/2018   TRIG 104 07/06/2018   CHOLHDL 2.6 07/06/2018   Lab Results  Component Value Date   CREATININE 0.88 03/08/2020   BUN 12 03/08/2020   NA 137 03/08/2020   K 4.3 03/08/2020   CL 102 03/08/2020   CO2 25 03/08/2020   Lab Results  Component Value Date   TSH 1.770 08/09/2019     ASSESSMENT/PLAN    Problem List Items Addressed This Visit    Aortic valve disorder (Chronic)    TTE would suggest the aortic valve disease is mild-mild to moderate in nature.  I am not sure that she would require double valve surgery, but would defer to Dr. Roxy Manns with his initial consultation.      Relevant Orders   Ambulatory referral to Cardiothoracic Surgery   Severe mitral regurgitation - Primary (Chronic)    Unexpectedly, her mitral valve regurgitation sees her progress faster than her aortic valve disease.  Now very severe MR with restricted P3 leaflet of the posterior mitral leaflet.  Given the severity of mitral valve disease, I think it is prudent to proceed with surgical consultation.  As part of the preop evaluation she will need right heart catheterization which we discussed today in detail.  See below for consent discussion.  Explained to her the pathophysiology of mitral vegetation what it means for sure outcome if nothing is done.  She asked about medical options and I just do not think there are any viable options to improve this regurgitation.  Longer goes on, where she will be.  I did indicate that I would prefer for many of the procedural type questions to be answered by Dr. Roxy Manns who said she was seen in consultation following catheterization.  -> We did send a referral request in the Dr. Roxy Manns, and the recommendation now is to actually also check a 30-day event monitor to exclude A. fib given her palpitation symptoms and the dilated left atrial appendage.      Relevant Orders   Ambulatory referral to Cardiothoracic Surgery   Essential hypertension (Chronic)  Blood pressure remains high today.  I would like to see what her pressures look like in the Cath Lab.  During her last visit, we plan to start HCTZ, but apparently that was not ordered.      Lower extremity edema (Chronic)    Consideration would be to use HCTZ or chlorthalidone but not a true loop  diuretic.  Otherwise recommend foot elevation and support stockings.      Heart palpitations (Chronic)    She has palpitations although they do not seem to be very long.  No beta-blocker because of resting bradycardia.  After discussion with Dr. Roxy Manns (shortly following the in[place] portion of the visit), he recommended that we will check a 30-day event monitor to exclude A. fib given the dilation of left atrial appendage.         Performing MD:  Glenetta Hew, M.D., M.S.  Procedure: Right and Left Heart Catheterization with Coronary Angiography  The procedure with Risks/Benefits/Alternatives and Indications was reviewed with the patient and her husband.  All questions were answered.    Risks / Complications include, but not limited to: Death, MI, CVA/TIA, VF/VT (with defibrillation), Bradycardia (need for temporary pacer placement), contrast induced nephropathy, bleeding / bruising / hematoma / pseudoaneurysm, vascular or coronary injury (with possible emergent CT or Vascular Surgery), adverse medication reactions, infection.  Additional risks involving the use of radiation with the possibility of radiation burns and cancer were explained in detail.  The patient and husband voice understanding and agree to proceed.     COVID-19 Education: The signs and symptoms of COVID-19 were discussed with the patient and how to seek care for testing (follow up with PCP or arrange E-visit).   The importance of social distancing and COVID-19 vaccination was discussed today. The patient is practicing social distancing & Masking.   I spent a total of 31minutes with the patient spent in direct patient consultation.  Additional time spent with chart review  / charting (studies, outside notes, etc): 12 Total Time: 43 min   Current medicines are reviewed at length with the patient today.  (+/- concerns) N/A  This visit occurred during the SARS-CoV-2 public health emergency.  Safety protocols were in  place, including screening questions prior to the visit, additional usage of staff PPE, and extensive cleaning of exam room while observing appropriate contact time as indicated for disinfecting solutions.  Notice: This dictation was prepared with Dragon dictation along with smaller phrase technology. Any transcriptional errors that result from this process are unintentional and may not be corrected upon review.  Patient Instructions / Medication Changes & Studies & Tests Ordered   Patient Instructions  Medication Instructions:   No changes  *If you need a refill on your cardiac medications before your next appointment, please call your pharmacy*   Lab Work: No labs except Covid test on Nov 29 , 2021 at 2:20 pm If you have labs (blood work) drawn today and your tests are completely normal, you will receive your results only by: [name] Kitchen MyChart Message (if you have MyChart) OR . A paper copy in the mail If you have any lab test that is abnormal or we need to change your treatment, we will call you to review the results.   Testing/Procedures: Will be schedule at [address] - cath lab Your physician has requested that you have a cardiac catheterization. Cardiac catheterization is used to diagnose and/or treat various heart conditions. Doctors may recommend this procedure for a number of  different reasons. The most common reason is to evaluate chest pain. Chest pain can be a symptom of coronary artery disease (CAD), and cardiac catheterization can show whether plaque is narrowing or blocking your heart's arteries. This procedure is also used to evaluate the valves, as well as measure the blood flow and oxygen levels in different parts of your heart. For further information please visit [email or web address][email or web address]. Please follow instruction sheet, as given.   Follow-Up: At [facility], you and your health needs are our priority.  As part of our continuing mission to provide you with  exceptional heart care, we have created designated Provider Care Teams.  These Care Teams include your primary Cardiologist (physician) and Advanced Practice Providers (APPs -  Physician Assistants and Nurse Practitioners) who all work together to provide you with the care you need, when you need it.     Your next appointment:      ~1-2   month  The format for your next appointment:   In [place]  Provider:   Glenetta Hew, MD  You have been referred to  Thoracic surgeon to discuss mitral valve issue      [facility] [place] [address] Dept: [phone] Loc: [place]  03/15/2020  You are scheduled for a Cardiac Catheterization on Thursday, December 2 with Dr. Glenetta Hew.  1. Please arrive at the [facility] (Main Entrance A) at [facility]: [address] [place], [place] [address] at 5:30 AM (This time is two hours before your procedure to ensure your preparation). Free valet parking service is available.   Special note: Every effort is made to have your procedure done on time. Please understand that emergencies sometimes delay scheduled procedures.  2. Diet: Do not eat solid foods after midnight.  The patient may have clear liquids until 5am upon the day of the procedure.  3. Labs: You will not need to have blood drawn . You will need a COVID-19  test prior to your procedure. You are scheduled for Nov 29 ,2021  At  2:20 PM. This is a Drive Up Visit at [address] [place], [place] [address]. Someone will direct you to the appropriate testing line. Stay in your car and someone will be with you shortly.   4. Medication instructions in preparation for your procedure:   Contrast Allergy: No  Stop taking, Diovan (Valsartan) Thursday, December 2,morning of procedure only   On the morning of your procedure, take your Aspirin and any morning  medicines NOT listed above.  You may use sips of water.  5. Plan for one night stay--bring personal belongings. 6. Bring a current list of your medications and current insurance cards. 7. You MUST have a responsible [employer] to drive you home. 8. Someone MUST be with you the first 24 hours after you arrive home or your discharge will be delayed. 9. Please wear clothes that are easy to get on and off and wear slip-on shoes.  Thank you for allowing [place] to care for you!   -- [facility]      Studies Ordered:   Orders Placed This Encounter  Procedures  . Ambulatory referral to Cardiothoracic Surgery     Glenetta Hew, M.D., M.S. Interventional Cardiologist   Pager # [phone] Phone # [phone] [address] [place], [place] [address]   Thank you for choosing Heartcare at [facility]!!

## 2020-03-17 ENCOUNTER — Encounter: Payer: Self-pay | Admitting: Cardiology

## 2020-03-17 NOTE — Assessment & Plan Note (Signed)
She has palpitations although they do not seem to be very long.  No beta-blocker because of resting bradycardia.  After discussion with Dr. Roxy Manns (shortly following the in[place] portion of the visit), he recommended that we will check a 30-day event monitor to exclude A. fib given the dilation of left atrial appendage.

## 2020-03-17 NOTE — Anesthesia Postprocedure Evaluation (Signed)
Anesthesia Post Note  Patient: Lisa Miles  Procedure(s) Performed: TRANSESOPHAGEAL ECHOCARDIOGRAM (TEE) (N/A )     Patient location during evaluation: Endoscopy Anesthesia Type: MAC Level of consciousness: awake and alert Pain management: pain level controlled Vital Signs Assessment: post-procedure vital signs reviewed and stable Respiratory status: spontaneous breathing, nonlabored ventilation, respiratory function stable and patient connected to nasal cannula oxygen Cardiovascular status: stable and blood pressure returned to baseline Postop Assessment: no apparent nausea or vomiting Anesthetic complications: no   No complications documented.  Last Vitals:  Vitals:   03/13/20 1005 03/13/20 1015  BP: (!) 108/57 (!) 120/55  Pulse: 62 (!) 55  Resp: (!) 21 (!) 21  Temp:    SpO2: 98% 100%    Last Pain:  Vitals:   03/14/20 1059  TempSrc:   PainSc: 0-No pain                 Ivee Poellnitz

## 2020-03-17 NOTE — Assessment & Plan Note (Signed)
Unexpectedly, her mitral valve regurgitation sees her progress faster than her aortic valve disease.  Now very severe MR with restricted P3 leaflet of the posterior mitral leaflet.  Given the severity of mitral valve disease, I think it is prudent to proceed with surgical consultation.  As part of the preop evaluation she will need right heart catheterization which we discussed today in detail.  See below for consent discussion.  Explained to her the pathophysiology of mitral vegetation what it means for sure outcome if nothing is done.  She asked about medical options and I just do not think there are any viable options to improve this regurgitation.  Longer goes on, where she will be.  I did indicate that I would prefer for many of the procedural type questions to be answered by Dr. Roxy Manns who said she was seen in consultation following catheterization.  -> We did send a referral request in the Dr. Roxy Manns, and the recommendation now is to actually also check a 30-day event monitor to exclude A. fib given her palpitation symptoms and the dilated left atrial appendage.

## 2020-03-17 NOTE — Assessment & Plan Note (Signed)
Blood pressure remains high today.  I would like to see what her pressures look like in the Cath Lab.  During her last visit, we plan to start HCTZ, but apparently that was not ordered.

## 2020-03-17 NOTE — Assessment & Plan Note (Signed)
TTE would suggest the aortic valve disease is mild-mild to moderate in nature.  I am not sure that she would require double valve surgery, but would defer to Dr. Roxy Manns with his initial consultation.

## 2020-03-17 NOTE — Assessment & Plan Note (Signed)
Consideration would be to use HCTZ or chlorthalidone but not a true loop diuretic.  Otherwise recommend foot elevation and support stockings.

## 2020-03-19 ENCOUNTER — Telehealth: Payer: Self-pay | Admitting: *Deleted

## 2020-03-19 ENCOUNTER — Encounter: Payer: Self-pay | Admitting: *Deleted

## 2020-03-19 DIAGNOSIS — R002 Palpitations: Secondary | ICD-10-CM

## 2020-03-19 DIAGNOSIS — I340 Nonrheumatic mitral (valve) insufficiency: Secondary | ICD-10-CM

## 2020-03-19 DIAGNOSIS — Z01818 Encounter for other preprocedural examination: Secondary | ICD-10-CM

## 2020-03-19 NOTE — Telephone Encounter (Signed)
Per Dr Ellyn Hack, schedule 30 day Event monitor for palp. Information to be obtained for  Probably upcoming surgery.   RN called an informed patient. She states she understood. Instruction sent via mychart.  order placed. Patient has an appointment with Dr Ricard Dillon on 04/01/20. Appointment with Dr Ellyn Hack on 04/11/20

## 2020-03-19 NOTE — Progress Notes (Signed)
Patient ID: Lisa Miles, female   DOB: Nov 20, 1950, 69 y.o.   MRN: [id number] Patient enrolled for [employer] to ship a 30 day cardiac event monitor to his home.

## 2020-03-23 IMAGING — DX DG ANKLE COMPLETE 3+V*R*
3 series · 3 of 3 positions shown · non-contrast
Comparison: None.

CLINICAL DATA: Unprovoked right foot and ankle swelling

EXAM:
RIGHT ANKLE - COMPLETE 3+ VIEW

[ankle ap]
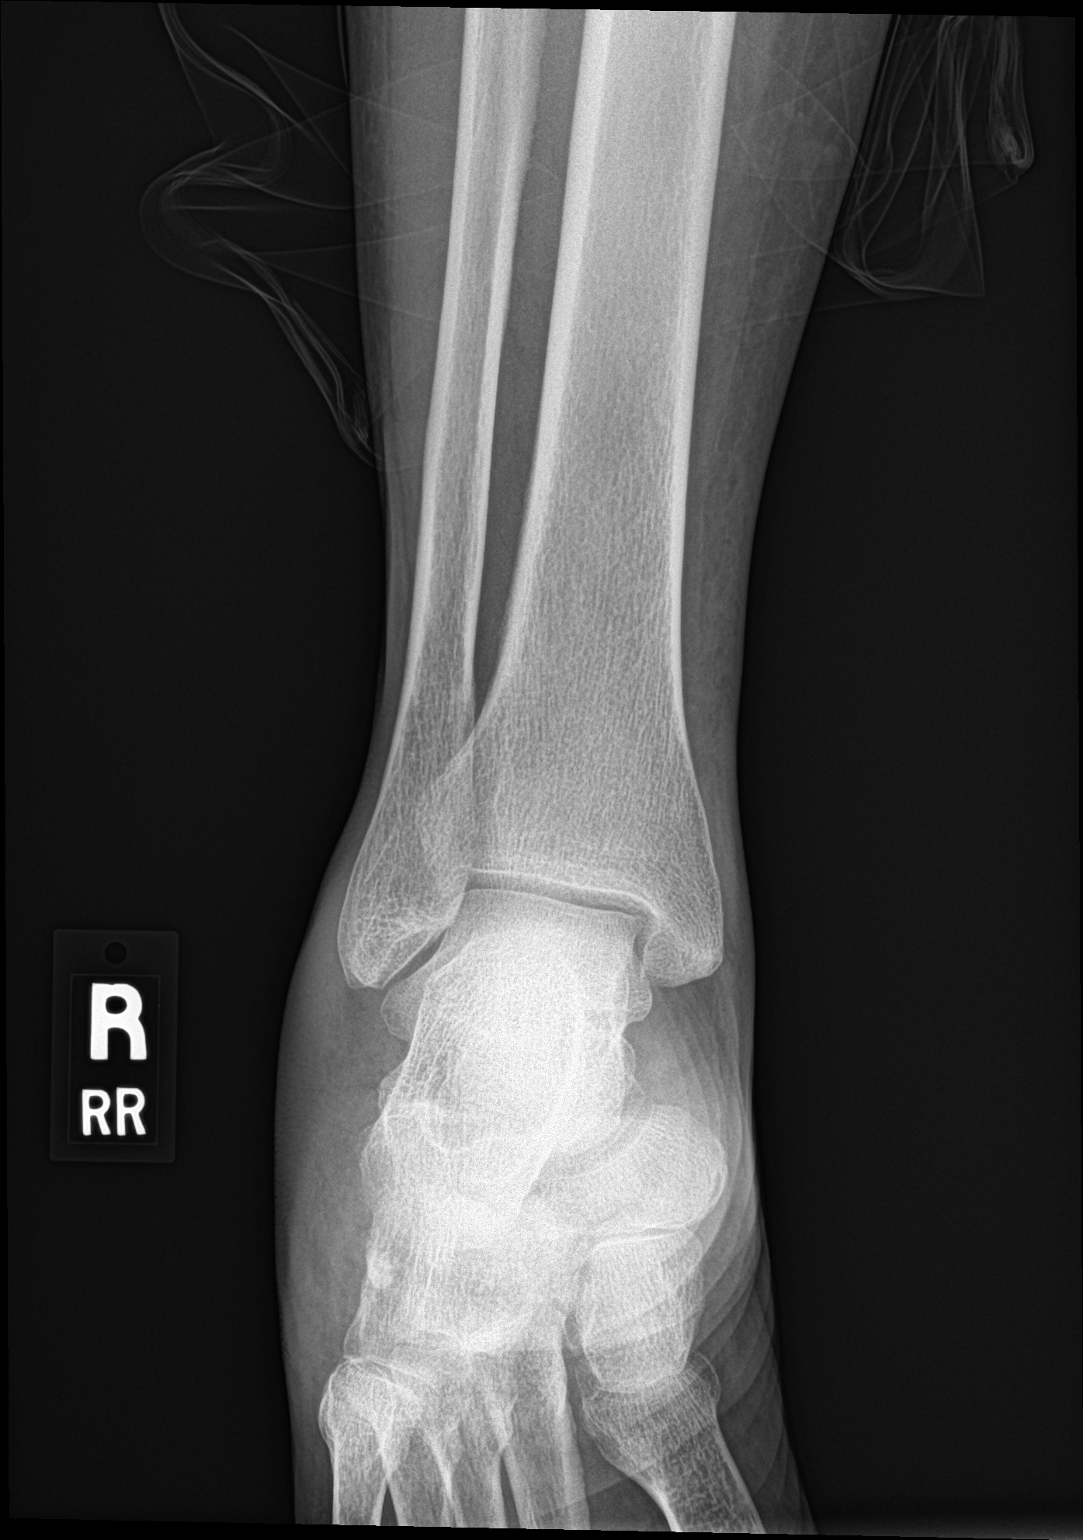

[ankle obl]
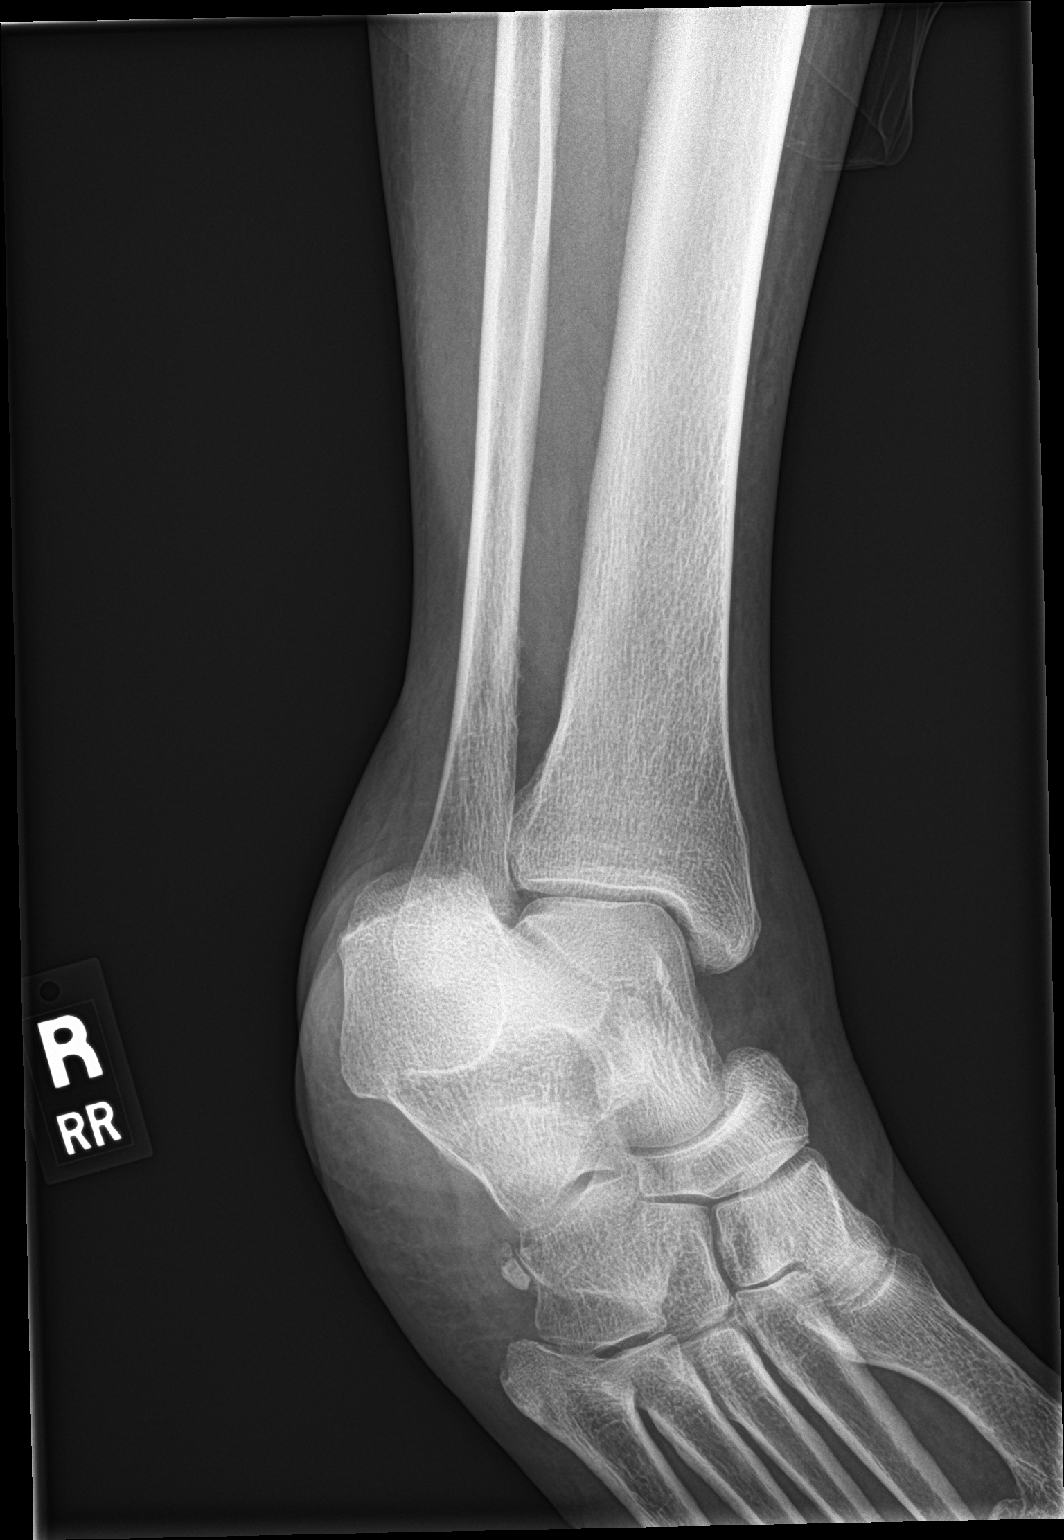

[ankle lat]
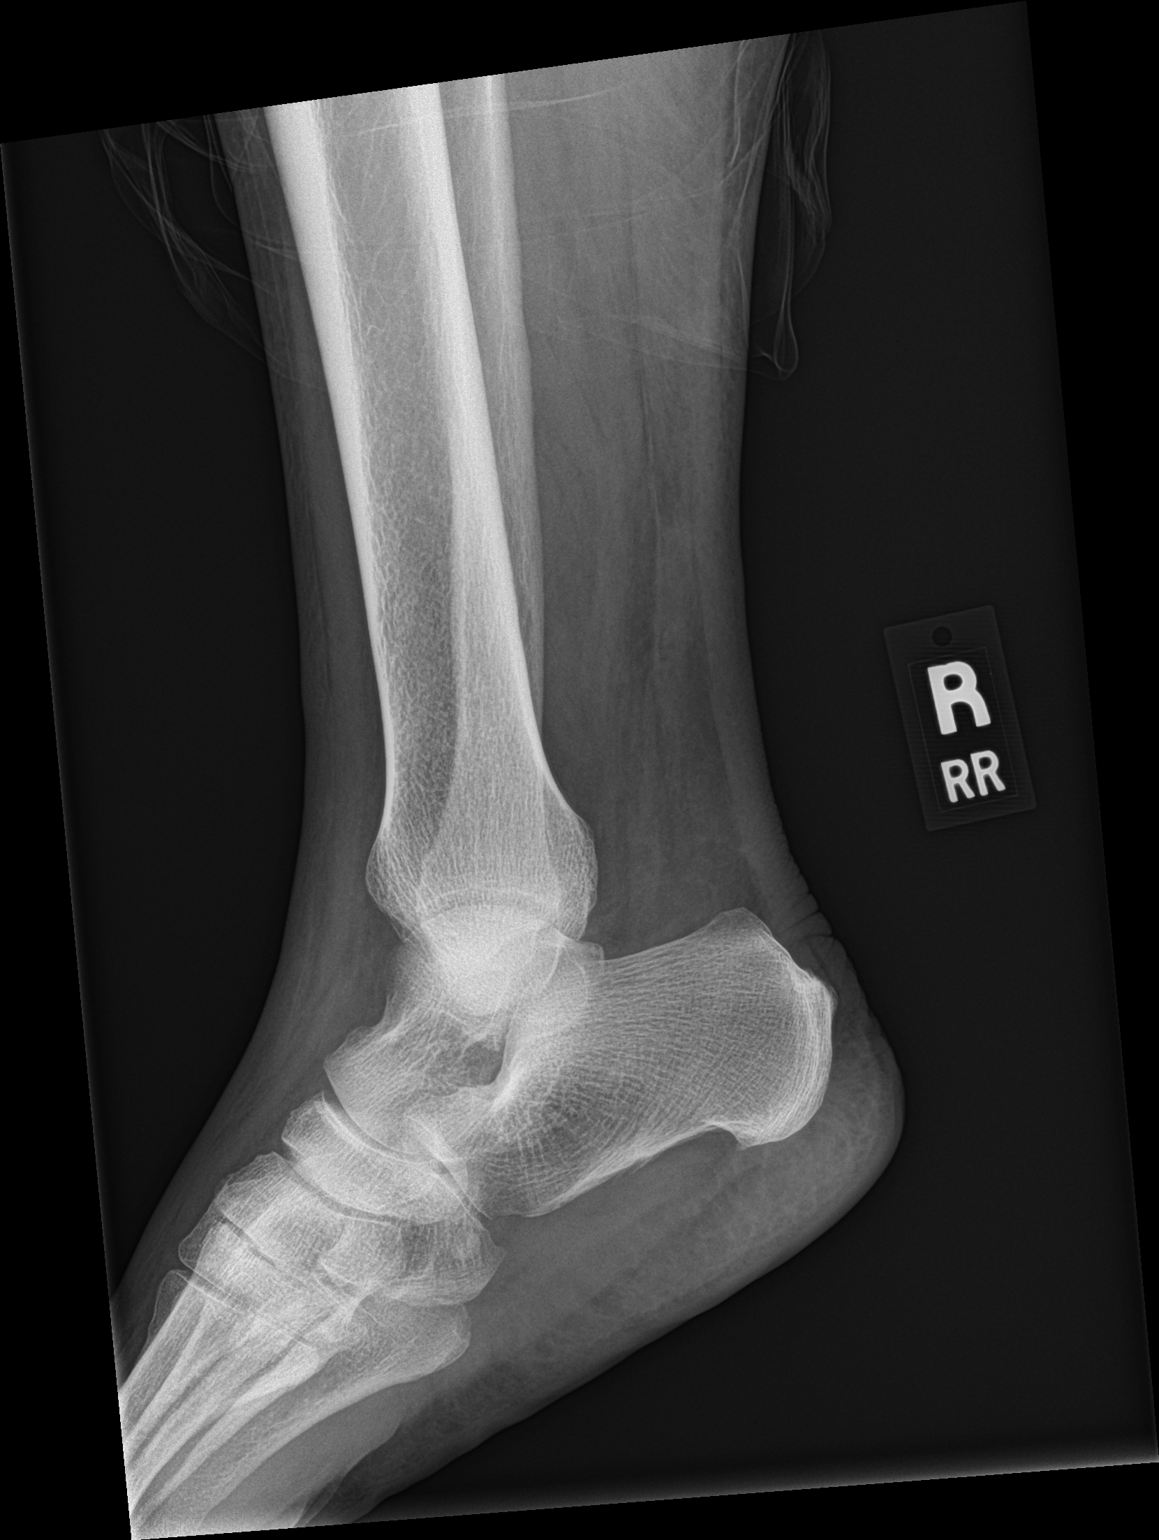

[3 of 3 positions shown; findings below may reference images not displayed]

FINDINGS: Soft tissue swelling without underlying fracture, erosion, or
malalignment. No degenerative joint narrowing or spurring.
IMPRESSION: Soft tissue swelling without osseous abnormality.

## 2020-03-23 IMAGING — DX DG FOOT COMPLETE 3+V*R*
3 series · 3 of 3 positions shown · non-contrast
Comparison: None.

CLINICAL DATA: Unprovoked right ankle and foot swelling

EXAM:
RIGHT FOOT COMPLETE - 3+ VIEW

[foot ap]
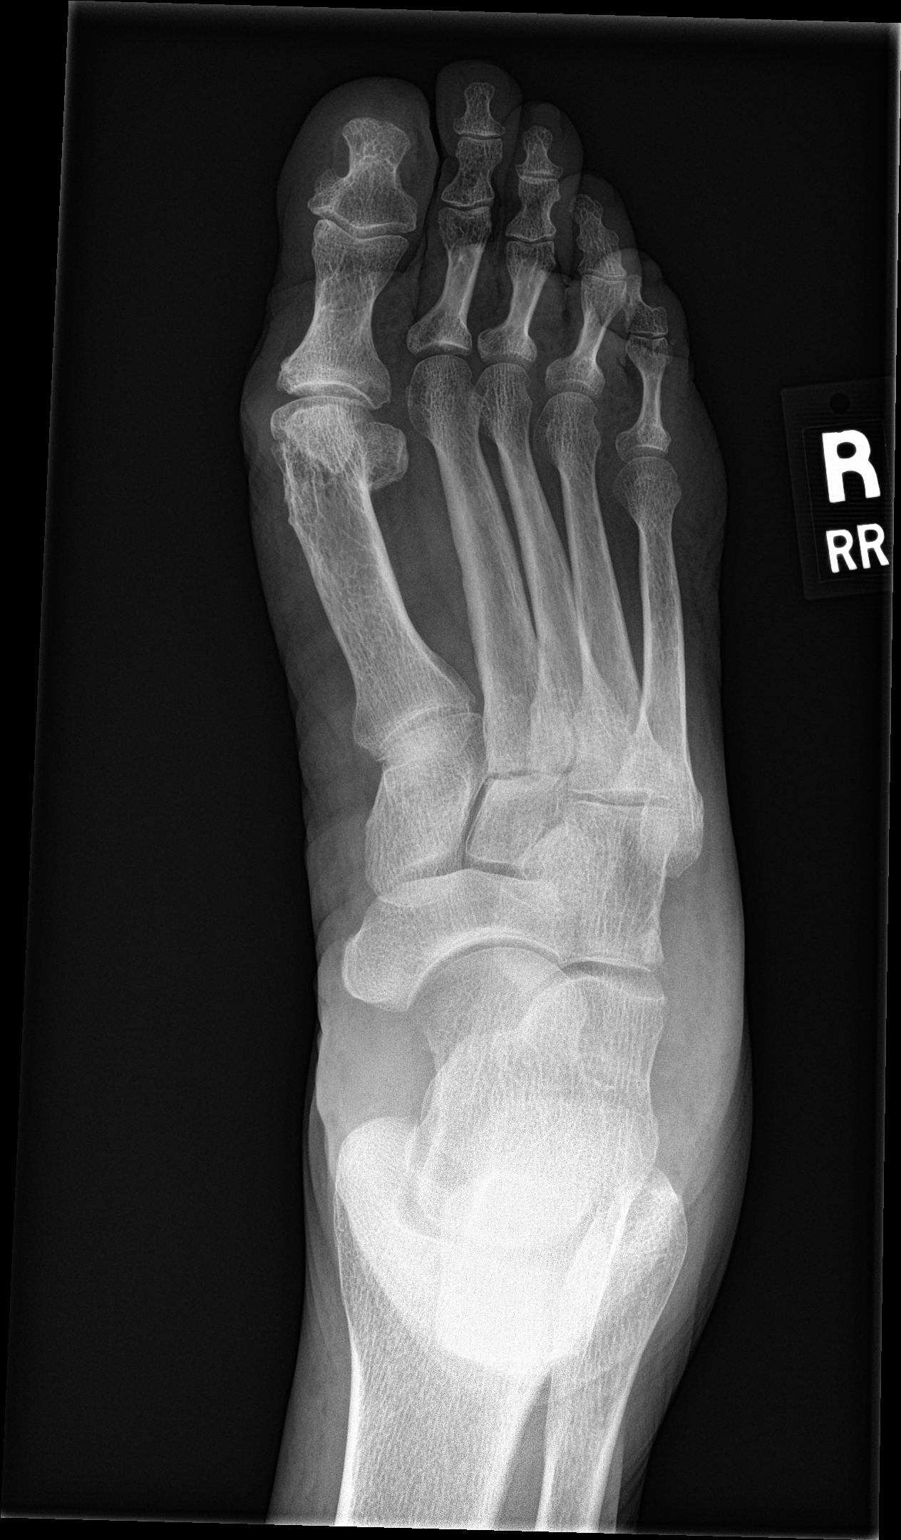

[foot obl]
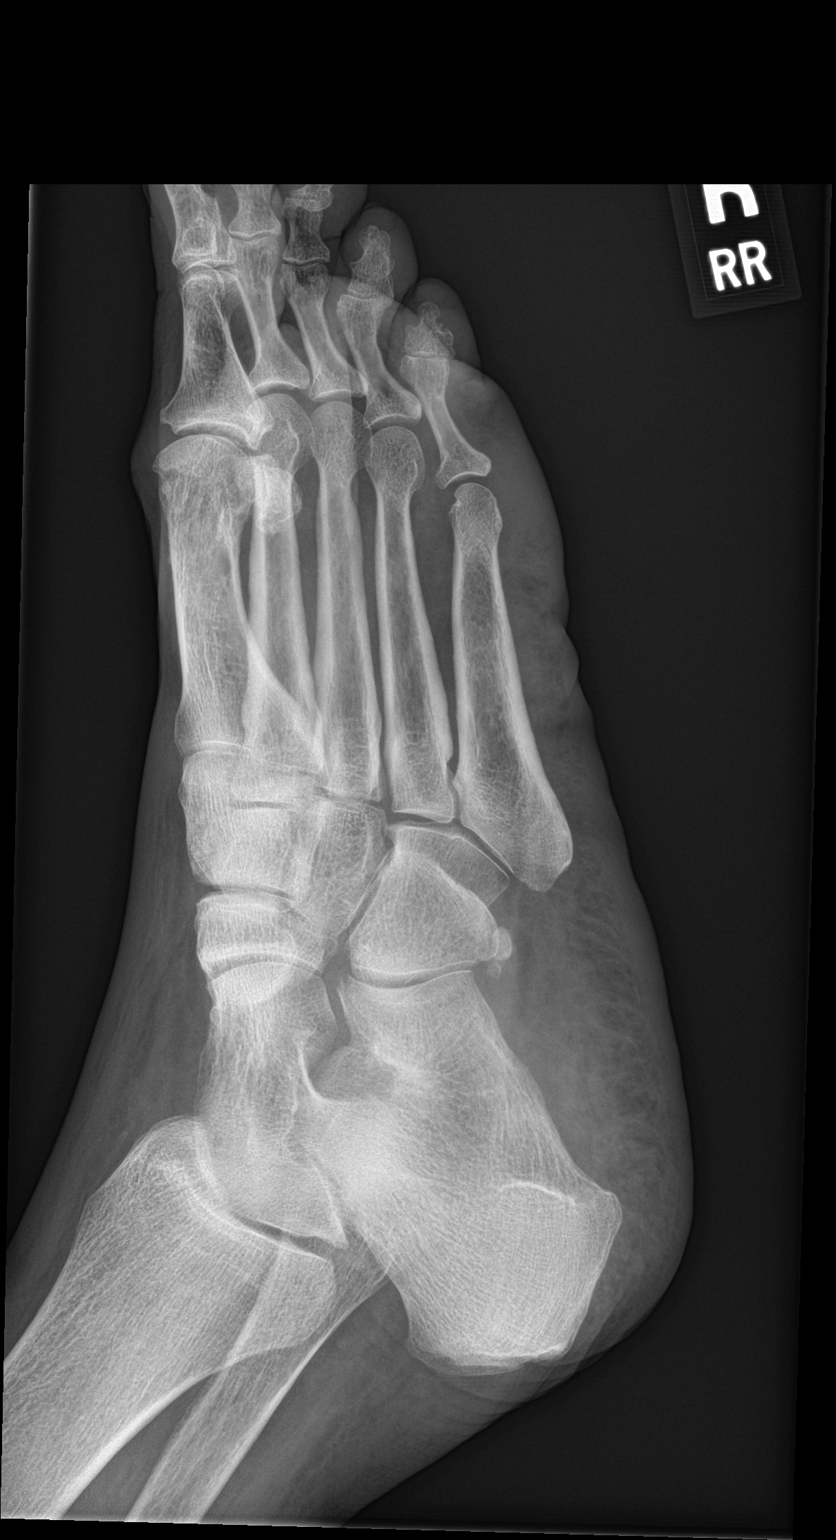

[foot lat]
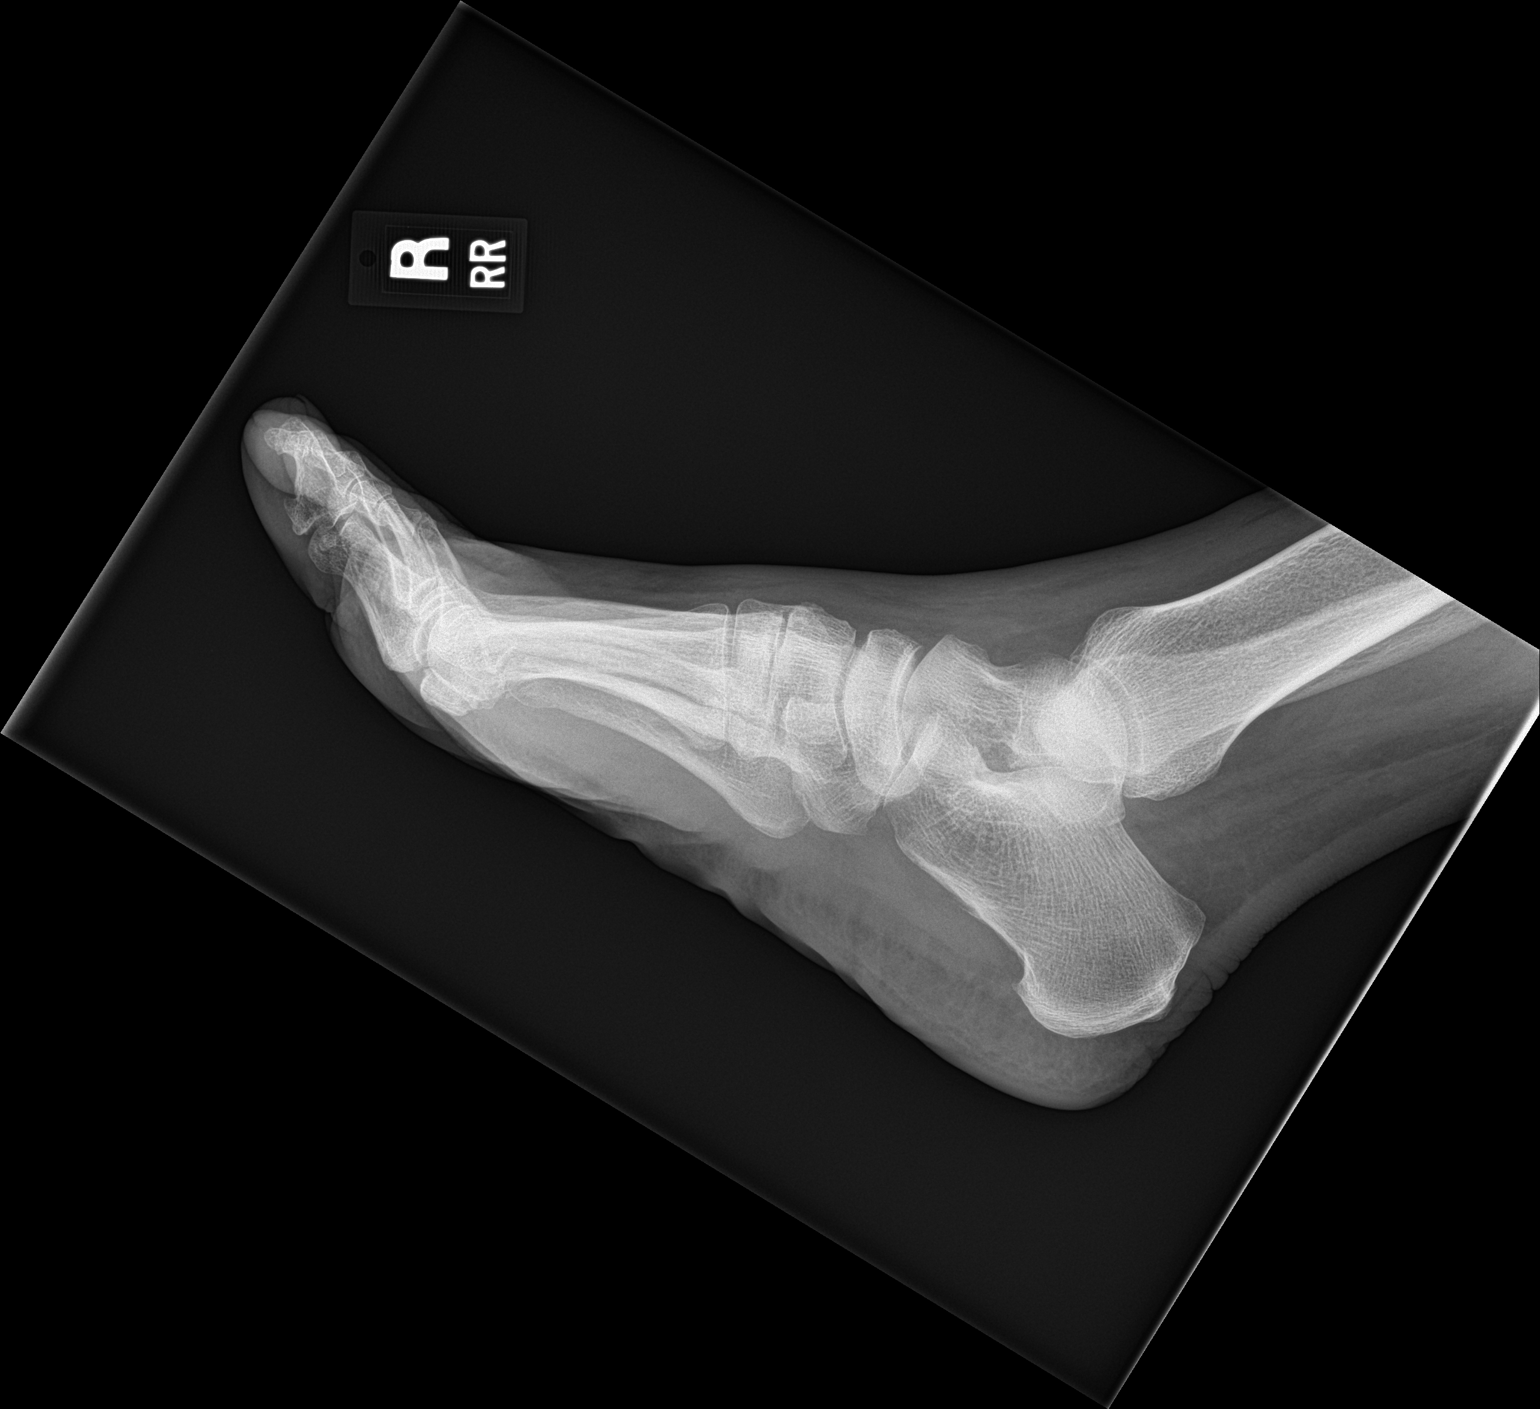

[3 of 3 positions shown; findings below may reference images not displayed]

FINDINGS: Soft tissue swelling about the hindfoot. No fracture or
malalignment. No erosion or soft tissue gas. Hallux valgus with
first metatarsal head ostectomy.
IMPRESSION: Soft tissue swelling without acute osseous finding.

## 2020-03-25 ENCOUNTER — Other Ambulatory Visit (HOSPITAL_COMMUNITY)
Admission: RE | Admit: 2020-03-25 | Discharge: 2020-03-25 | Disposition: A | Discharge status: Home / Self Care | Payer: PPO | Source: Ambulatory Visit | Attending: Cardiology | Admitting: Cardiology

## 2020-03-25 DIAGNOSIS — Z01812 Encounter for preprocedural laboratory examination: Secondary | ICD-10-CM | POA: Insufficient documentation

## 2020-03-25 DIAGNOSIS — Z20822 Contact with and (suspected) exposure to covid-19: Secondary | ICD-10-CM | POA: Diagnosis not present

## 2020-03-25 LAB — SARS CORONAVIRUS 2 (TAT 6-24 HRS): SARS Coronavirus 2: NEGATIVE

## 2020-03-26 ENCOUNTER — Telehealth: Payer: Self-pay | Admitting: *Deleted

## 2020-03-26 NOTE — Telephone Encounter (Signed)
Pt contacted pre-catheterization scheduled at [facility] for: Thursday March 28, 2020 7:30 AM  Verified arrival time and place: [place] [facility]) at: 5:30 AM   No solid food after midnight prior to cath, clear liquids until 5 AM day of procedure.   AM meds can be  taken pre-cath with sips of water including: ASA 81 mg   Confirmed patient has responsible adult to drive home post procedure and be with patient first 24 hours after arriving home: yes  You are allowed ONE visitor in the waiting room during the time you are at the hospital for your procedure. Both you and your visitor must wear a mask once you enter the hospital.       COVID-19 Pre-Screening Questions:  . In the past 14 days have you had any symptoms concerning for COVID-19 infection (fever, chills, cough, or new shortness of breath)? no . In the past 14 days have you been around anyone with known Covid 19? No   Reviewed procedure/mask/visitor instructions, COVID-19 questions with patient.

## 2020-03-27 HISTORY — PX: OTHER SURGICAL HISTORY: SHX169

## 2020-03-28 ENCOUNTER — Encounter (HOSPITAL_COMMUNITY)
Admission: RE | Disposition: A | Discharge status: Home / Self Care | Payer: Self-pay | Source: Home / Self Care | Attending: Cardiology

## 2020-03-28 ENCOUNTER — Other Ambulatory Visit: Payer: Self-pay

## 2020-03-28 ENCOUNTER — Encounter (HOSPITAL_COMMUNITY): Payer: Self-pay | Admitting: Cardiology

## 2020-03-28 ENCOUNTER — Ambulatory Visit (HOSPITAL_COMMUNITY)
Admission: RE | Admit: 2020-03-28 | Discharge: 2020-03-28 | Disposition: A | Discharge status: Home / Self Care | Payer: PPO | Attending: Cardiology | Admitting: Cardiology

## 2020-03-28 DIAGNOSIS — I359 Nonrheumatic aortic valve disorder, unspecified: Secondary | ICD-10-CM | POA: Insufficient documentation

## 2020-03-28 DIAGNOSIS — I340 Nonrheumatic mitral (valve) insufficiency: Secondary | ICD-10-CM | POA: Diagnosis not present

## 2020-03-28 DIAGNOSIS — R609 Edema, unspecified: Secondary | ICD-10-CM | POA: Insufficient documentation

## 2020-03-28 DIAGNOSIS — R002 Palpitations: Secondary | ICD-10-CM | POA: Diagnosis present

## 2020-03-28 DIAGNOSIS — I351 Nonrheumatic aortic (valve) insufficiency: Secondary | ICD-10-CM | POA: Diagnosis present

## 2020-03-28 DIAGNOSIS — I10 Essential (primary) hypertension: Secondary | ICD-10-CM | POA: Diagnosis not present

## 2020-03-28 DIAGNOSIS — Z7982 Long term (current) use of aspirin: Secondary | ICD-10-CM | POA: Insufficient documentation

## 2020-03-28 DIAGNOSIS — Z79899 Other long term (current) drug therapy: Secondary | ICD-10-CM | POA: Insufficient documentation

## 2020-03-28 HISTORY — PX: RIGHT/LEFT HEART CATH AND CORONARY ANGIOGRAPHY: CATH118266

## 2020-03-28 LAB — POCT I-STAT EG7
Acid-Base Excess: 0 mmol/L (ref 0.0–2.0)
Acid-Base Excess: 1 mmol/L (ref 0.0–2.0)
Bicarbonate: 26.6 mmol/L (ref 20.0–28.0)
Bicarbonate: 27.5 mmol/L (ref 20.0–28.0)
Calcium, Ion: 1.21 mmol/L (ref 1.15–1.40)
Calcium, Ion: 1.21 mmol/L (ref 1.15–1.40)
HCT: 34 % — ABNORMAL LOW (ref 36.0–46.0)
HCT: 34 % — ABNORMAL LOW (ref 36.0–46.0)
Hemoglobin: 11.6 g/dL — ABNORMAL LOW (ref 12.0–15.0)
Hemoglobin: 11.6 g/dL — ABNORMAL LOW (ref 12.0–15.0)
O2 Saturation: 71 %
O2 Saturation: 70 %
Potassium: 3.7 mmol/L (ref 3.5–5.1)
Potassium: 3.7 mmol/L (ref 3.5–5.1)
Sodium: 144 mmol/L (ref 135–145)
Sodium: 143 mmol/L (ref 135–145)
TCO2: 28 mmol/L (ref 22–32)
TCO2: 29 mmol/L (ref 22–32)
pCO2, Ven: 50.8 mmHg (ref 44.0–60.0)
pCO2, Ven: 52.6 mmHg (ref 44.0–60.0)
pH, Ven: 7.328 (ref 7.250–7.430)
pH, Ven: 7.326 (ref 7.250–7.430)
pO2, Ven: 40 mmHg (ref 32.0–45.0)
pO2, Ven: 40 mmHg (ref 32.0–45.0)

## 2020-03-28 LAB — POCT I-STAT 7, (LYTES, BLD GAS, ICA,H+H)
Acid-Base Excess: 2 mmol/L (ref 0.0–2.0)
Bicarbonate: 28.1 mmol/L — ABNORMAL HIGH (ref 20.0–28.0)
Calcium, Ion: 1.29 mmol/L (ref 1.15–1.40)
HCT: 35 % — ABNORMAL LOW (ref 36.0–46.0)
Hemoglobin: 11.9 g/dL — ABNORMAL LOW (ref 12.0–15.0)
O2 Saturation: 100 %
Potassium: 3.9 mmol/L (ref 3.5–5.1)
Sodium: 142 mmol/L (ref 135–145)
TCO2: 30 mmol/L (ref 22–32)
pCO2 arterial: 51.1 mmHg — ABNORMAL HIGH (ref 32.0–48.0)
pH, Arterial: 7.349 — ABNORMAL LOW (ref 7.350–7.450)
pO2, Arterial: 205 mmHg — ABNORMAL HIGH (ref 83.0–108.0)

## 2020-03-28 SURGERY — RIGHT/LEFT HEART CATH AND CORONARY ANGIOGRAPHY
Anesthesia: LOCAL

## 2020-03-28 MED ORDER — SODIUM CHLORIDE 0.9 % WEIGHT BASED INFUSION
3.0000 mL/kg/h | INTRAVENOUS | Status: AC
  Administered 2020-03-28: 3 mL/kg/h via INTRAVENOUS

## 2020-03-28 MED ORDER — MIDAZOLAM HCL 2 MG/2ML IJ SOLN
INTRAMUSCULAR | Status: DC | PRN
  Administered 2020-03-28: 1 mg via INTRAVENOUS

## 2020-03-28 MED ORDER — VERAPAMIL HCL 2.5 MG/ML IV SOLN
INTRAVENOUS | Status: AC
  Filled 2020-03-28: qty 2

## 2020-03-28 MED ORDER — HEPARIN (PORCINE) IN NACL 1000-0.9 UT/500ML-% IV SOLN
INTRAVENOUS | Status: DC | PRN
  Administered 2020-03-28 (×2): 500 mL

## 2020-03-28 MED ORDER — HEPARIN (PORCINE) IN NACL 1000-0.9 UT/500ML-% IV SOLN
INTRAVENOUS | Status: AC
  Filled 2020-03-28: qty 1000

## 2020-03-28 MED ORDER — SODIUM CHLORIDE 0.9% FLUSH: 3.0000 mL | INTRAVENOUS | Status: DC | PRN

## 2020-03-28 MED ORDER — SODIUM CHLORIDE 0.9 % IV SOLN: INTRAVENOUS | Status: DC

## 2020-03-28 MED ORDER — HEPARIN SODIUM (PORCINE) 1000 UNIT/ML IJ SOLN
INTRAMUSCULAR | Status: DC | PRN
  Administered 2020-03-28: 5000 [IU] via INTRAVENOUS

## 2020-03-28 MED ORDER — SODIUM CHLORIDE 0.9% FLUSH: 3.0000 mL | Freq: Two times a day (BID) | INTRAVENOUS | Status: DC

## 2020-03-28 MED ORDER — LABETALOL HCL 5 MG/ML IV SOLN: 10.0000 mg | INTRAVENOUS | Status: DC | PRN

## 2020-03-28 MED ORDER — FENTANYL CITRATE (PF) 100 MCG/2ML IJ SOLN
INTRAMUSCULAR | Status: DC | PRN
  Administered 2020-03-28: 25 ug via INTRAVENOUS

## 2020-03-28 MED ORDER — MIDAZOLAM HCL 2 MG/2ML IJ SOLN
INTRAMUSCULAR | Status: AC
  Filled 2020-03-28: qty 2

## 2020-03-28 MED ORDER — HEPARIN SODIUM (PORCINE) 1000 UNIT/ML IJ SOLN
INTRAMUSCULAR | Status: AC
  Filled 2020-03-28: qty 1

## 2020-03-28 MED ORDER — LIDOCAINE HCL (PF) 1 % IJ SOLN
INTRAMUSCULAR | Status: AC
  Filled 2020-03-28: qty 30

## 2020-03-28 MED ORDER — HYDRALAZINE HCL 20 MG/ML IJ SOLN: 10.0000 mg | INTRAMUSCULAR | Status: DC | PRN

## 2020-03-28 MED ORDER — VERAPAMIL HCL 2.5 MG/ML IV SOLN
INTRAVENOUS | Status: DC | PRN
  Administered 2020-03-28: 10 mL via INTRA_ARTERIAL

## 2020-03-28 MED ORDER — FENTANYL CITRATE (PF) 100 MCG/2ML IJ SOLN
INTRAMUSCULAR | Status: AC
  Filled 2020-03-28: qty 2

## 2020-03-28 MED ORDER — ACETAMINOPHEN 325 MG PO TABS: 650.0000 mg | ORAL_TABLET | ORAL | Status: DC | PRN

## 2020-03-28 MED ORDER — SODIUM CHLORIDE 0.9 % WEIGHT BASED INFUSION: 1.0000 mL/kg/h | INTRAVENOUS | Status: DC

## 2020-03-28 MED ORDER — SODIUM CHLORIDE 0.9 % IV SOLN: 250.0000 mL | INTRAVENOUS | Status: DC | PRN

## 2020-03-28 MED ORDER — ONDANSETRON HCL 4 MG/2ML IJ SOLN: 4.0000 mg | Freq: Four times a day (QID) | INTRAMUSCULAR | Status: DC | PRN

## 2020-03-28 MED ORDER — LIDOCAINE HCL (PF) 1 % IJ SOLN
INTRAMUSCULAR | Status: DC | PRN
  Administered 2020-03-28 (×2): 2 mL

## 2020-03-28 MED ORDER — IOHEXOL 350 MG/ML SOLN
INTRAVENOUS | Status: DC | PRN
  Administered 2020-03-28: 45 mL via INTRA_ARTERIAL

## 2020-03-28 MED ORDER — ASPIRIN 81 MG PO CHEW: 81.0000 mg | CHEWABLE_TABLET | ORAL | Status: DC

## 2020-03-28 SURGICAL SUPPLY — 14 items
CATH OPTITORQUE TIG 4.0 5F (CATHETERS) ×2 IMPLANT
CATH SWAN GANZ 7F STRAIGHT (CATHETERS) ×2 IMPLANT
DEVICE RAD COMP TR BAND LRG (VASCULAR PRODUCTS) ×2 IMPLANT
GLIDESHEATH SLEND SS 6F .021 (SHEATH) ×2 IMPLANT
GLIDESHEATH SLENDER 7FR .021G (SHEATH) ×4 IMPLANT
GUIDEWIRE .025 260CM (WIRE) ×2 IMPLANT
GUIDEWIRE INQWIRE 1.5J.035X260 (WIRE) ×1 IMPLANT
INQWIRE 1.5J .035X260CM (WIRE) ×2
KIT HEART LEFT (KITS) ×2 IMPLANT
PACK CARDIAC CATHETERIZATION (CUSTOM PROCEDURE TRAY) ×2 IMPLANT
SHEATH PROBE COVER 6X72 (BAG) ×2 IMPLANT
TRANSDUCER W/STOPCOCK (MISCELLANEOUS) ×2 IMPLANT
TUBING CIL FLEX 10 FLL-RA (TUBING) ×2 IMPLANT
WIRE EMERALD 3MM-J .025X260CM (WIRE) ×2 IMPLANT

## 2020-03-28 NOTE — Discharge Instructions (Signed)
Drink plenty of fluid for 48 hours and keep wrist elevated at heart level for 24 hours  Radial Site Care   This sheet gives you information about how to care for yourself after your procedure. Your health care provider may also give you more specific instructions. If you have problems or questions, contact your health care provider. What can I expect after the procedure? After the procedure, it is common to have:  Bruising and tenderness at the catheter insertion area. Follow these instructions at home: Medicines  Take over-the-counter and prescription medicines only as told by your health care provider. Insertion site care 1. Follow instructions from your health care provider about how to take care of your insertion site. Make sure you: ? Wash your hands with soap and water before you change your bandage (dressing). If soap and water are not available, use hand sanitizer. ? remove your dressing as told by your health care provider. In 24 hours 2. Check your insertion site every day for signs of infection. Check for: ? Redness, swelling, or pain. ? Fluid or blood. ? Pus or a bad smell. ? Warmth. 3. Do not take baths, swim, or use a hot tub until your health care provider approves. 4. You may shower 24-48 hours after the procedure, or as directed by your health care provider. ? Remove the dressing and gently wash the site with plain soap and water. ? Pat the area dry with a clean towel. ? Do not rub the site. That could cause bleeding. 5. Do not apply powder or lotion to the site. Activity   1. For 24 hours after the procedure, or as directed by your health care provider: ? Do not flex or bend the affected arm. ? Do not push or pull heavy objects with the affected arm. ? Do not drive yourself home from the hospital or clinic. You may drive 24 hours after the procedure unless your health care provider tells you not to. ? Do not operate machinery or power tools. 2. Do not lift  anything that is heavier than 10 lb (4.5 kg), or the limit that you are told, until your health care provider says that it is safe. For 4 days 3. Ask your health care provider when it is okay to: ? Return to work or school. ? Resume usual physical activities or sports. ? Resume sexual activity. General instructions  If the catheter site starts to bleed, raise your arm and put firm pressure on the site. If the bleeding does not stop, get help right away. This is a medical emergency.  If you went home on the same day as your procedure, a responsible adult should be with you for the first 24 hours after you arrive home.  Keep all follow-up visits as told by your health care provider. This is important. Contact a health care provider if:  You have a fever.  You have redness, swelling, or yellow drainage around your insertion site. Get help right away if:  You have unusual pain at the radial site.  The catheter insertion area swells very fast.  The insertion area is bleeding, and the bleeding does not stop when you hold steady pressure on the area.  Your arm or hand becomes pale, cool, tingly, or numb. These symptoms may represent a serious problem that is an emergency. Do not wait to see if the symptoms will go away. Get medical help right away. Call your local emergency services (911 in the [place] Do not   drive yourself to the hospital. Summary  After the procedure, it is common to have bruising and tenderness at the site.  Follow instructions from your health care provider about how to take care of your radial site wound. Check the wound every day for signs of infection.  Do not lift anything that is heavier than 10 lb (4.5 kg), or the limit that you are told, until your health care provider says that it is safe. This information is not intended to replace advice given to you by your health care provider. Make sure you discuss any questions you have with your health care  provider. Document Revised: 05/19/2017 Document Reviewed: 05/19/2017 [employer]vier Patient Education  2020 [employer]  

## 2020-03-28 NOTE — Interval H&P Note (Signed)
History and Physical Interval Note:  03/28/2020 7:35 AM  Lisa Miles  has presented today for surgery, with the diagnosis of mitral regurge, aortic insuffency.  The various methods of treatment have been discussed with the patient and family. After consideration of risks, benefits and other options for treatment, the patient has consented to  Procedure(s): RIGHT/LEFT HEART CATH AND CORONARY ANGIOGRAPHY (N/A) as a surgical intervention.  The patient's history has been reviewed, patient examined, no change in status, stable for surgery.  I have reviewed the patient's chart and labs.  Questions were answered to the patient's satisfaction.     Glenetta Hew

## 2020-03-28 NOTE — Progress Notes (Signed)
Patient was given discharge instructions. She verbalized understanding. 

## 2020-04-01 ENCOUNTER — Other Ambulatory Visit: Payer: Self-pay

## 2020-04-01 ENCOUNTER — Institutional Professional Consult (permissible substitution) (INDEPENDENT_AMBULATORY_CARE_PROVIDER_SITE_OTHER): Payer: PPO | Admitting: Thoracic Surgery (Cardiothoracic Vascular Surgery)

## 2020-04-01 ENCOUNTER — Other Ambulatory Visit: Payer: Self-pay | Admitting: Thoracic Surgery (Cardiothoracic Vascular Surgery)

## 2020-04-01 ENCOUNTER — Encounter: Payer: Self-pay | Admitting: Thoracic Surgery (Cardiothoracic Vascular Surgery)

## 2020-04-01 VITALS — BP 154/86 | HR 78 | Resp 18 | Ht 66.0 in | Wt 207.6 lb

## 2020-04-01 DIAGNOSIS — I351 Nonrheumatic aortic (valve) insufficiency: Secondary | ICD-10-CM | POA: Diagnosis not present

## 2020-04-01 DIAGNOSIS — I340 Nonrheumatic mitral (valve) insufficiency: Secondary | ICD-10-CM

## 2020-04-01 NOTE — Progress Notes (Signed)
[facility] SURGERY CONSULTATION REPORT  Referring Provider is Leonie Man, MD PCP is Maximiano Coss, NP  Chief Complaint  Patient presents with  . Mitral Regurgitation    Cath 12/2, TEE 11/17  . Consult    Initial surgical consult    HPI:  Patient is 69 year old moderately obese African-American female originally from [place] with history of likely rheumatic heart disease with mild aortic stenosis, mild to moderate aortic insufficiency, mitral regurgitation, hypertension, asthma, bronchitis, cough, anemia, and GE reflux disease who has been referred for surgical consultation to discuss treatment options for management of severe mitral regurgitation.  Patient states that she was first told that she had a heart murmur approximately 10 years ago.  She denies any known history of rheumatic fever or scarlet fever during childhood or her young adult years.  She has been followed for the last several years by Dr. Ellyn Hack.  Echocardiogram performed October 2019 revealed normal left ventricular systolic function with diastolic dysfunction related to hypertension.  The patient was noted to have mild aortic stenosis with moderate aortic insufficiency and mild mitral regurgitation.  There was severe left atrial enlargement.  Follow-up echocardiogram performed January 31, 2020 revealed normal left ventricular function but significant progression of valvular disease with what appeared to be possibly severe mitral regurgitation.  There remained mild aortic stenosis with mild to moderate aortic insufficiency.  Transesophageal echocardiogram was performed March 13, 2020 which confirmed the presence of severe mitral regurgitation.  The leaflets appeared thickened with restriction of the posterior leaflet causing severe mitral regurgitation.  PISA radius measured 0.9 cm corresponding to 2D ERO 0.26 cm or regurgitant volume 54 mL and 3D  ERO of 0.30 cm corresponding to regurgitant volume 62 mL.  There was systolic blunting in all pulmonary veins but no systolic flow reversal.  The aortic valve appeared trileaflet with some fibrosis and leaflet retraction consistent with rheumatic disease.  There was mild aortic stenosis with mild to moderate aortic insufficiency.  Peak velocity across aortic valve measured 2.6 m/s corresponding to mean transvalvular gradient 16 mmHg and aortic valve area calculated 1.65 cm by VTI.  The aortic insufficiency pressure half-time was measured 731 ms.  Left ventricular ejection fraction was estimated 60 to 65%.  There was moderate left atrial enlargement.  The patient subsequently underwent diagnostic cardiac catheterization March 28, 2020 which revealed normal coronary artery anatomy with no significant coronary artery disease.  There were normal right heart pressures with prominent V waves and wedge tracing consistent with significant mitral regurgitation.  Cardiothoracic surgical consultation was requested.  Patient is married and lives locally in [place].  She has been retired for approximately 2-1/2 years having previously worked for [employer].  She does not exercise on a regular basis but she reports no significant physical limitations.  She drives a car and tends to ordinary chores around the house.  She denies any symptoms of exertional shortness of breath or chest discomfort.  She states that if she goes up a flight of stairs she gets tired but not breathless.  She reports occasional palpitations.  She reports occasional vague tightness across her chest which is not usually associated with exertion.  She has intermittent cough which is at times productive and has been attributed to both GE reflux disease and postnasal drip.  Cough seems to improve with Nexium therapy.  She has had some mild right lower extremity edema but lately she has not had  any swelling in her legs as long as  she makes an effort to keep her feet propped up.  She denies any history of resting shortness of breath, PND, orthopnea, or lower extremity edema.  She has not had any exertional chest pain, dizzy spells, nor syncope.  Past Medical History:  Diagnosis Date  . Aortic stenosis, mild-moderate 07/2012   TTE October 21: Moderate aortic valve thickening with mild to moderate AI & mild AS; TEE: mild to moderate AI & MIld AS  . Arthritis    Phreesia 11/06/2019  . Asthma   . Bilateral bunions    Bunionectomies performed  . Bronchitis, chronic ([place])   . Cataract    Phreesia 11/06/2019  . Heart disease   . Hyperlipidemia   . Hypertension    Good control  . Inguinodynia    Bilateral groin pain  . Lower extremity edema    Chronic. Venous Duplex 11/06/11 SUMMARY: 1) Bilateral Lower Extremities: No evidence of DVT or thrombophlebitis.  2) Right Common Femoral Vein: Demonstrated mild valvular insufficiency with a greater than (1) sec of duration. Mildly abnormal LE Venous duplex Doppler.  . Obesity   . Palpitations    Relatively well controlled  . Scoliosis    DG Chest 2 View x-ray on 07/30/11 by Dr. Everlene Farrier shows a scoliosis.  . Severe mitral regurgitation by prior echocardiogram 07/2012   Mild AMVL prolapse, Mod MR -- no MVP noted in 01/2018 -> 11/'21: Severe MR due to restricted movement of P3 scallop of PMVL (IIIB) due to incomplete leaflet coaptation -> thickened leaflets consistent with Rheumatic Heart Valve Disease.    Past Surgical History:  Procedure Laterality Date  . ABDOMINAL HYSTERECTOMY    . ANKLE ARTHROSCOPY Left   . BUNIONECTOMY Bilateral   . EYE SURGERY N/A    Phreesia 11/06/2019  . KNEE ARTHROSCOPY Right   . Left knee open surgery     pt thinks she only had shots in L knee, not surgery  . RIGHT/LEFT HEART CATH AND CORONARY ANGIOGRAPHY N/A 03/28/2020   Procedure: RIGHT/LEFT HEART CATH AND CORONARY ANGIOGRAPHY;  Surgeon: Leonie Man, MD;  Location: [facility];   Service: Cardiovascular;  Laterality: N/A;  . TEE WITHOUT CARDIOVERSION N/A 03/13/2020   Procedure: TRANSESOPHAGEAL ECHOCARDIOGRAM (TEE);  Surgeon: Geralynn Rile, MD;  [facility] ENDOSCOPY;;; Severe MR 2/2 restricted P3 scallop of PMVL (IIIB) w/ incomplete leaflet coaptation.  Thickened /degenerative leaflets c/w Rheumatic Valvular Heart Disease.  2D PISA radius 0.9 cm with 2D ERO 0.26 cm2 with R Vol 62 cc. PV blunting w/ BP 109/60 mmHg. No MS. Mild-mod AI / Mild AS. Gr 2 plaque Aorta  . TOTAL ABDOMINAL HYSTERECTOMY    . TRANSTHORACIC ECHOCARDIOGRAM  07/2012; 01/2013   a) 4/'14: mild-mod AS, Mod AI (mean/peak gradient: 18 mmHg / 31 mmhg).  Mild MVP w/ Mod MR, no MS.  Mildly elevated PAP (~37 mmHg) w/ Mod TR. EF 55-60%. GR 1 DD.;; b). 01/2013: EF 60-65%. No RWMA. Gr  DD. Mild AS (M / P Gradient 20 mmHg/34 mmHg). Mild AMVL Prolapse w/ Mod MR. Mod LA dilation. Mod TR.  [name] Kitchen TRANSTHORACIC ECHOCARDIOGRAM  01/2015; 01/2016   a) Normal LV size and function. GR 1 DD. Moderate LA Dilation. Calcified aortic valve with mild AS, moderate (2+) AI. Restricted posterior MV leaflet movement w/ Mod MR. Mode TR with mod elevated PA pressures (45 mmHg);; b) Relatively stable. Moderate concentric LVH. Moderate AI with mild AS (Mean-PeaK Gradient: 19 / 36 mmHg). Mild  MR. Mod LA dilation. PAP ~40 mmHg -> plan follow-up 2020.  [name] Kitchen TRANSTHORACIC ECHOCARDIOGRAM  01/2018   Normal LV size.  Moderate concentric LVH.  EF 60 to 65%.  No R WMA.  GR 1 DD (high filling pressures).  Mild AS (mean gradient 16 mmHg) with moderate AI.  Severe LA dilation.  Mild MR.  . TRANSTHORACIC ECHOCARDIOGRAM  01/31/2020    EF 65 to 70% with normal wall motion..  Severe MR with restricted PMVL movement.  (Recommend TEE).  Moderate aortic valve thickening with mild to moderate stenosis, and moderate aortic regurgitation.  Normal RV function.  Mildly elevated pressures.  Severe LA dilation.     Family History  Problem Relation Age of Onset  . Arthritis  Mother   . Hyperlipidemia Mother   . Diabetes Mother   . Gout Mother   . Hypertension Mother   . Cancer Father        GI cancer  . Diabetes Maternal Grandfather   . Hypertension Brother   . Diabetes Brother   . Hypertension Brother   . Migraines Neg Hx   . Headache Neg Hx     Social History   Socioeconomic History  . Marital status: Married    Spouse name: Herbie Baltimore  . Number of children: 2  . Years of education: Not on file  . Highest education level: Not on file  Occupational History  . Occupation: production    Employer: [employer]    Comment: check printing/shipping  Tobacco Use  . Smoking status: Never Smoker  . Smokeless tobacco: Never Used  Vaping Use  . Vaping Use: Never used  Substance and Sexual Activity  . Alcohol use: No  . Drug use: No  . Sexual activity: Never  Other Topics Concern  . Not on file  Social History Narrative   Lives with her husband.  Their children live nearby.   Right handed   Caffeine: 1 cup/day   Social Determinants of Health   Financial Resource Strain:   . Difficulty of Paying Living Expenses: Not on file  Food Insecurity:   . Worried About Charity fundraiser in the Last Year: Not on file  . Ran Out of Food in the Last Year: Not on file  Transportation Needs:   . Lack of Transportation (Medical): Not on file  . Lack of Transportation (Non-Medical): Not on file  Physical Activity:   . Days of Exercise per Week: Not on file  . Minutes of Exercise per Session: Not on file  Stress:   . Feeling of Stress : Not on file  Social Connections:   . Frequency of Communication with Friends and Family: Not on file  . Frequency of Social Gatherings with Friends and Family: Not on file  . Attends Religious Services: Not on file  . Active Member of Clubs or Organizations: Not on file  . Attends Archivist Meetings: Not on file  . Marital Status: Not on file  Intimate Partner Violence:   . Fear of Current or Ex-Partner: Not  on file  . Emotionally Abused: Not on file  . Physically Abused: Not on file  . Sexually Abused: Not on file    Current Outpatient Medications  Medication Sig Dispense Refill  . amoxicillin (AMOXIL) 500 MG capsule Take 2,000 mg by mouth See admin instructions. Take 4 capsules (2000 mg) by mouth 1 hour prior to dental appointments    . aspirin EC 81 MG tablet Take 81 mg by  mouth daily. Swallow whole.    . Calcium Carbonate+Vitamin D (CALCIUM 600+D3) 600-200 MG-UNIT TABS Take 2 tablets by mouth daily.    . Cholecalciferol (VITAMIN D) 50 MCG (2000 UT) CAPS Take 2,000 Units by mouth daily.    [name] Kitchen esomeprazole (NEXIUM) 20 MG capsule Take 1 capsule (20 mg total) by mouth 2 (two) times daily before a meal. 60 capsule 3  . estradiol (ESTRACE) 2 MG tablet Take 2 mg by mouth daily.    . hydroxypropyl methylcellulose / hypromellose (ISOPTO TEARS / GONIOVISC) 2.5 % ophthalmic solution Place 1 drop into both eyes in the morning and at bedtime.    . valsartan (DIOVAN) 320 MG tablet Take 1 tablet by mouth once daily (Patient taking differently: Take 320 mg by mouth daily. ) 90 tablet 0   No current facility-administered medications for this visit.    No Known Allergies    Review of Systems:   General:  normal appetite, normal energy, no weight gain, no weight loss, no fever  Cardiac:  no chest pain with exertion, occasional fleeting chest pain at rest, no SOB with exertion, no resting SOB, no PND, no orthopnea, + palpitations, no arrhythmia, no atrial fibrillation, occasional LE edema but none recently, no dizzy spells, no syncope  Respiratory:  no shortness of breath, no home oxygen, occasional productive cough, occasional dry cough, occasional bronchitis, no wheezing, no hemoptysis, no asthma, no pain with inspiration or cough, no sleep apnea, no CPAP at night  GI:   no difficulty swallowing, + reflux, no frequent heartburn, no hiatal hernia, no abdominal pain, no constipation, no diarrhea, no  hematochezia, no hematemesis, no melena  GU:   no dysuria,  no frequency, no urinary tract infection, no hematuria, no kidney stones, no kidney disease  Vascular:  no pain suggestive of claudication, no pain in feet, no leg cramps, no varicose veins, no DVT, no non-healing foot ulcer  Neuro:   no stroke, no TIA's, no seizures, no headaches, no temporary blindness one eye,  no slurred speech, no peripheral neuropathy, no chronic pain, no instability of gait, no memory/cognitive dysfunction  Musculoskeletal: mild arthritis, no joint swelling, no myalgias, no difficulty walking, normal mobility   Skin:   no rash, no itching, no skin infections, no pressure sores or ulcerations  Psych:   no anxiety, no depression, + nervousness, no unusual recent stress  Eyes:   some blurry vision, no floaters, possible recent vision changes, + wears glasses or contacts  ENT:   no hearing loss, no loose or painful teeth, partial dentures, last saw dentist 2 1/2 years ago  Hematologic:  no easy bruising, no abnormal bleeding, no clotting disorder, no frequent epistaxis  Endocrine:  no diabetes, does not check CBG's at home           Physical Exam:   BP (!) 154/86 (BP Location: Right Arm, Patient Position: Sitting)   Pulse 78   Resp 18   Ht 5' 6" (1.676 m)   Wt 207 lb 9.6 oz (94.2 kg)   SpO2 98% Comment: RA with mask on  BMI 33.51 kg/m   General:  Moderately obese,  well-appearing  HEENT:  Unremarkable   Neck:   no JVD, no bruits, no adenopathy   Chest:   clear to auscultation, symmetrical breath sounds, no wheezes, no rhonchi   CV:   RRR, grade III/VI holosystolic and early diastolic murmurs heard best at LLSB and apex  Abdomen:  soft, non-tender, no masses   Extremities:  warm, well-perfused, pulses diminished but palpable, no LE edema  Rectal/GU  Deferred  Neuro:   Grossly non-focal and symmetrical throughout  Skin:   Clean and dry, no rashes, no breakdown   Diagnostic Tests:  EKG: NSR w/out  acute ischemic changes nor conduction delay (03/28/2020)     ECHOCARDIOGRAM REPORT    Patient Name:  [facility] Date of Exam: 01/31/2020  Medical Rec #: [id number]    Height:    66.0 in  Accession #:  [id number]   Weight:    215.0 lb  Date of Birth: 1951-01-31    BSA:     2.062 m  Patient Age:  65 years    BP:      147/76 mmHg  Patient Gender: F        HR:      59 bpm.  Exam Location: [facility]   Procedure: 2D Echo, 3D Echo, Cardiac Doppler, Color Doppler and Strain  Analysis   Indications:  I34 Mitral regurgitation    History:    Patient has prior history of Echocardiogram examinations,  most         recent 02/02/2018. Aortic Valve Disease; Risk         Factors:Hypertension, Sleep Apnea, Dyslipidemia and  Obesity.         Edema.    Sonographer:  Jessee Avers, RDCS  Referring Phys: [place]    1. Degenerative mitral valve disease with restricted PMVL movement in  systole (IIIB). Severe MR is present. 2D PISA radius 1.1 cm, ERO 0.44 cm2,  R vol 83 cc. There is systolic flow reversal in the right upper pulmonary  vein. BP during study 147/76. TEE  is recommended for further clarification. The mitral valve is  degenerative. Severe mitral valve regurgitation.  2. The aortic valve is tricuspid. There is moderate calcification of the  aortic valve. There is moderate thickening of the aortic valve. Aortic  valve regurgitation is moderate. Mild to moderate aortic valve stenosis.  Aortic valve area, by VTI measures  1.54 cm. Aortic valve mean gradient measures 15.0 mmHg. Aortic valve Vmax  measures 2.73 m/s.  3. Left ventricular ejection fraction, by estimation, is 65 to 70%. Left  ventricular ejection fraction by 3D volume is 69 %. The left ventricle has  normal function. The left ventricle has no regional wall motion  abnormalities. Left ventricular  diastolic  function could not be evaluated. The average left ventricular global  longitudinal strain is -24.1 %. The global longitudinal strain is normal.  4. Right ventricular systolic function is normal. The right ventricular  size is normal. There is mildly elevated pulmonary artery systolic  pressure. The estimated right ventricular systolic pressure is 35.5 mmHg.  5. Left atrial size was severely dilated.  6. The inferior vena cava is normal in size with greater than 50%  respiratory variability, suggesting right atrial pressure of 3 mmHg.   Comparison(s): Prior images unable to be directly viewed, comparison made  by report only. Changes from prior study are noted. 02/02/18 EF 60-65%.  Mild AS 94mHg mean PG, 396mg peak PG. Mild MR. PA pressure 3129m.   FINDINGS  Left Ventricle: Left ventricular ejection fraction, by estimation, is 65  to 70%. Left ventricular ejection fraction by 3D volume is 69 %. The left  ventricle has normal function. The left ventricle has no regional wall  motion abnormalities. The average  left ventricular global longitudinal strain is -24.1 %. The global  longitudinal strain is normal. The left ventricular internal cavity size  was normal in size. There is no left ventricular hypertrophy. Left  ventricular diastolic function could not be  evaluated due to mitral regurgitation (moderate or greater). Left  ventricular diastolic function could not be evaluated.   Right Ventricle: The right ventricular size is normal. No increase in  right ventricular wall thickness. Right ventricular systolic function is  normal. There is mildly elevated pulmonary artery systolic pressure. The  tricuspid regurgitant velocity is 3.26  m/s, and with an assumed right atrial pressure of 3 mmHg, the estimated  right ventricular systolic pressure is 16.1 mmHg.   Left Atrium: Left atrial size was severely dilated.   Right Atrium: Right atrial size was normal in size.    Pericardium: Trivial pericardial effusion is present.   Mitral Valve: Degenerative mitral valve disease with restricted PMVL  movement in systole (IIIB). Severe MR is present. 2D PISA radius 1.1 cm,  ERO 0.44 cm2, R vol 83 cc. There is systolic flow reversal in the right  upper pulmonary vein. BP during study  147/76. TEE is recommended for further clarification. The mitral valve is  degenerative in appearance. There is moderate calcification of the  posterior mitral valve leaflet(s). Mild mitral annular calcification.  Severe mitral valve regurgitation, with  centrally-directed jet.   Tricuspid Valve: The tricuspid valve is grossly normal. Tricuspid valve  regurgitation is mild . No evidence of tricuspid stenosis.   Aortic Valve: The aortic valve is tricuspid. There is moderate  calcification of the aortic valve. There is moderate thickening of the  aortic valve. Aortic valve regurgitation is moderate. Aortic regurgitation  PHT measures 499 msec. Mild to moderate  aortic stenosis is present. Aortic valve mean gradient measures 15.0 mmHg.  Aortic valve peak gradient measures 29.8 mmHg. Aortic valve area, by VTI  measures 1.54 cm.   Pulmonic Valve: The pulmonic valve was grossly normal. Pulmonic valve  regurgitation is trivial. No evidence of pulmonic stenosis.   Aorta: The aortic root and ascending aorta are structurally normal, with  no evidence of dilitation.   Venous: A pattern of systolic flow reversal, suggestive of severe mitral  regurgitation is recorded from the right upper pulmonary vein. The  inferior vena cava is normal in size with greater than 50% respiratory  variability, suggesting right atrial  pressure of 3 mmHg.   IAS/Shunts: The atrial septum is grossly normal.     LEFT VENTRICLE  PLAX 2D  LVIDd:     5.60 cm  LVIDs:     3.60 cm     2D  LV PW:     0.90 cm     Longitudinal  LV IVS:    0.90 cm     Strain  LVOT diam:    2.00 cm     2D Strain GLS -17.8 %  LV SV:     94       (A2C):  LV SV Index:  46       2D Strain GLS -28.7 %  LVOT Area:   3.14 cm    (A3C):                 2D Strain GLS -25.7 %                 (A4C):                 2D Strain GLS -24.1 %  Avg:                   3D Volume EF                 LV 3D EF:  Left                       ventricular                       ejection                       fraction by                       3D volume                       is 69 %.                   3D Volume EF:                 3D EF:    69 %                 LV EDV:    178 ml                 LV ESV:    55 ml                 LV SV:    123 ml   RIGHT VENTRICLE  RV Basal diam: 2.90 cm  RV S prime:   9.46 cm/s  TAPSE (M-mode): 2.2 cm  RVSP:      50.5 mmHg   LEFT ATRIUM       Index    RIGHT ATRIUM      Index  LA diam:    5.10 cm 2.47 cm/m RA Pressure: 8.00 mmHg  LA Vol (A2C):  95.7 ml 46.41 ml/m RA Area:   14.40 cm  LA Vol (A4C):  102.0 ml 49.46 ml/m RA Volume:  36.20 ml 17.55 ml/m  LA Biplane Vol: 99.9 ml 48.44 ml/m  AORTIC VALVE  AV Area (Vmax):  1.44 cm  AV Area (Vmean):  1.54 cm  AV Area (VTI):   1.54 cm  AV Vmax:      273.00 cm/s  AV Vmean:     171.000 cm/s  AV VTI:      0.609 m  AV Peak Grad:   29.8 mmHg  AV Mean Grad:   15.0 mmHg  LVOT Vmax:     125.00 cm/s  LVOT Vmean:    83.600 cm/s  LVOT VTI:     0.299 m  LVOT/AV VTI ratio: 0.49  AI PHT:      499 msec    AORTA  Ao Root diam: 3.50 cm  Ao Asc diam: 3.80 cm   MITRAL VALVE          TRICUSPID VALVE                TR Peak grad:  42.5 mmHg                TR Vmax:    326.00 cm/s  MR Peak grad:  109.8 mmHg Estimated RAP: 8.00 mmHg  MR Mean grad:  78.0 mmHg  RVSP:      50.5 mmHg  MR Vmax:  524.00 cm/s  MR Vmean:    421.5 cm/s SHUNTS  MR PISA:     7.60 cm  Systemic VTI: 0.30 m  MR PISA Eff ROA: 46 mm   Systemic Diam: 2.00 cm  MR PISA Radius: 1.10 cm  MV E velocity: 178.00 cm/s  MV A velocity: 69.00 cm/s  MV E/A ratio: 2.58   Eleonore Chiquito MD  Electronically signed by Eleonore Chiquito MD  Signature Date/Time: 01/31/2020/4:05:04 PM        TRANSESOPHOGEAL ECHO REPORT       Patient Name:  [facility] Date of Exam: 03/13/2020  Medical Rec #: [id number]    Height:    66.0 in  Accession #:  [id number]   Weight:    212.0 lb  Date of Birth: 08-29-1950    BSA:     2.050 m  Patient Age:  33 years    BP:      109/60 mmHg  Patient Gender: F        HR:      63 bpm.  Exam Location: Inpatient   Procedure: Transesophageal Echo, 3D Echo, Color Doppler and Cardiac  Doppler   Indications:  mitral regurgitation    History:    Patient has prior history of Echocardiogram examinations,  most         recent 01/31/2020. Signs/Symptoms:Murmur; Risk         Factors:Hypertension and Dyslipidemia. Mild AS. MR.    Sonographer:  Jannett Celestine RDCS (AE)  Referring Phys: [place]: After discussion of the risks and benefits of a TEE, an  informed consent was obtained from the patient. TEE procedure time was 40  minutes. The transesophogeal probe was passed without difficulty through  the esophogus of the patient. Imaged  were obtained with the patient in a left lateral decubitus position. Local  oropharyngeal anesthetic was provided with Cetacaine. Sedation performed  by different physician. Image quality  was excellent. The patient's vital  signs; including heart rate,  blood pressure, and oxygen saturation; remained stable throughout the  procedure. The patient developed no complications during the procedure.   IMPRESSIONS    1. There is severe mitral regurgitation due to restricted movement of the  PMVL in systole (IIIB). The regurgitant jet is due to restricted P3  movement with incomplete leaflet coaptation. The leaflets and thickened  and degenerative. The leaflets due to  appear thickened and the PMVL is retracted possibly related to rheumatic  valvular heart disease. 2D PISA radiums 0.9 cm with 2D ERO 0.26 cm2 with R  vol 54 cm. 3D ERO 0.30 cm2 with R vol 62 cc. There is systolic blunting in  all pulmonary veins. Of note,  the patient's blood pressure during the study was 109/60 which will lessen  the severity of measurements. The MVA by 3D MPR is 4.1 cm2. Tenting height  10 mm with coaptation length 4.5 mm. PMVL length 15.5 mm. The mitral valve  is degenerative. Severe  mitral valve regurgitation. No evidence of mitral stenosis.  2. The aortic valve leaflets are tricuspid. The leaflets are thickened  and calcified with some retraction noted at the leaflet tips, possibly  related to rheumatic valvular heart disease. There is mild to moderate  aortic regurgitation that is central. 2D  VC 0.21 cm. 3D VCA 0.13 cm2 with R vol 29 cc. There is no diastolic flow  reversal in the descending aorta. Mild to moderate aortic stenosis is  present. V max 2.6  m/s, MG 16 mmHG, AVA 1.65 cm2 by VTI. 3D MPR AVA 1.4  cm2. The aortic valve is tricuspid.  There is moderate calcification of the aortic valve. There is mild  thickening of the aortic valve. Aortic valve regurgitation is mild to  moderate. Mild aortic valve stenosis.  3. Left ventricular ejection fraction, by estimation, is 60 to 65%. Left  ventricular ejection fraction by 3D volume is 65 %. The left ventricle has  normal function.   4. Right ventricular systolic function is normal. The right ventricular  size is normal. There is normal pulmonary artery systolic pressure. The  estimated right ventricular systolic pressure is 09.3 mmHg.  5. Left atrial size was moderately dilated. No left atrial/left atrial  appendage thrombus was detected. The LAA emptying velocity was 100 cm/s.  6. There is mild (Grade II) layered plaque involving the transverse  aorta.  7. The inferior vena cava is normal in size with greater than 50%  respiratory variability, suggesting right atrial pressure of 3 mmHg.   FINDINGS  Left Ventricle: Left ventricular ejection fraction, by estimation, is 60  to 65%. Left ventricular ejection fraction by 3D volume is 65 %. The left  ventricle has normal function. The left ventricular internal cavity size  was normal in size.   Right Ventricle: The right ventricular size is normal. No increase in  right ventricular wall thickness. Right ventricular systolic function is  normal. There is normal pulmonary artery systolic pressure. The tricuspid  regurgitant velocity is 2.51 m/s, and  with an assumed right atrial pressure of 3 mmHg, the estimated right  ventricular systolic pressure is 81.8 mmHg.   Left Atrium: Left atrial size was moderately dilated. No left atrial/left  atrial appendage thrombus was detected. The LAA emptying velocity was 100  cm/s.   Right Atrium: Right atrial size was normal in size.   Pericardium: There is no evidence of pericardial effusion.   Mitral Valve: There is severe mitral regurgitation due to restricted  movement of the PMVL in systole (IIIB). The regurgitant jet is due to  restricted P3 movement with incomplete leaflet coaptation. The leaflets  and thickened and degenerative. The  leaflets due to appear thickened and the PMVL is retracted possibly  related to rheumatic valvular heart disease. 2D PISA radiums 0.9 cm with  2D ERO 0.26 cm2 with R vol 54 cm. 3D  ERO 0.30 cm2 with R vol 62 cc. There  is systolic blunting in all pulmonary  veins. Of note, the patient's blood pressure during the study was 109/60  which will lessen the severity of measurements. The MVA by 3D MPR is 4.1  cm2. Tenting height 10 mm with coaptation length 4.5 mm. PMVL length 15.5  mm. The mitral valve is  degenerative in appearance. There is mild calcification of the posterior  and anterior mitral valve leaflet(s). Severe mitral valve regurgitation.  No evidence of mitral valve stenosis.   Tricuspid Valve: The tricuspid valve is normal in structure. Tricuspid  valve regurgitation is trivial. No evidence of tricuspid stenosis.   Aortic Valve: The aortic valve leaflets are tricuspid. The leaflets are  thickened and calcified with some retraction noted at the leaflet tips,  possibly related to rheumatic valvular heart disease. There is mild to  moderate aortic regurgitation that is  central. 2D VC 0.21 cm. 3D VCA 0.13 cm2 with R vol 29 cc. There is no  diastolic flow reversal in the descending aorta. Mild to moderate aortic  stenosis is present. V  max 2.6 m/s, MG 16 mmHG, AVA 1.65 cm2 by VTI. 3D  MPR AVA 1.4 cm2. The aortic valve is  tricuspid. There is moderate calcification of the aortic valve. There is  mild thickening of the aortic valve. Aortic valve regurgitation is mild to  moderate. Aortic regurgitation PHT measures 731 msec. Mild aortic stenosis  is present. Aortic valve mean  gradient measures 16.0 mmHg. Aortic valve peak gradient measures 27.9  mmHg. Aortic valve area, by VTI measures 1.65 cm.   Pulmonic Valve: The pulmonic valve was grossly normal. Pulmonic valve  regurgitation is trivial. No evidence of pulmonic stenosis.   Aorta: The aortic root is normal in size and structure. There is mild  (Grade II) layered plaque involving the transverse aorta.   Venous: A systolic blunting flow pattern is recorded from the left upper  pulmonary vein and the right  upper pulmonary vein. The inferior vena cava  is normal in size with greater than 50% respiratory variability,  suggesting right atrial pressure of 3  mmHg.   IAS/Shunts: No atrial level shunt detected by color flow Doppler.     LEFT VENTRICLE  PLAX 2D  LVOT diam:   2.00 cm  LV SV:     104  LV SV Index:  51       3D Volume EF  LVOT Area:   3.14 cm    LV 3D EF:  Left                       ventricular                       ejection                       fraction by                       3D volume                       is 65 %.                   3D Volume EF                 LV 3D EF:  64.50 %                 LV 3D EDV:  90700.00                       mm                 LV 3D ESV:  32200.00                       mm                 LV 3D SV:  58600.00                       mm                   3D Volume EF:                 3D EF:    65 %   AORTIC VALVE  AV Area (Vmax):  1.73 cm  AV Area (Vmean):  1.82 cm  AV Area (VTI):   1.65 cm  AV Vmax:      264.00 cm/s  AV Vmean:     170.333 cm/s  AV VTI:      0.629 m  AV Peak Grad:   27.9 mmHg  AV Mean Grad:   16.0 mmHg  LVOT Vmax:     145.00 cm/s  LVOT Vmean:    98.700 cm/s  LVOT VTI:     0.330 m  LVOT/AV VTI ratio: 0.52  AI PHT:      731 msec    AORTA  Ao Root diam: 3.50 cm  Ao Asc diam: 3.60 cm   MR Peak grad:  170.6 mmHg TRICUSPID VALVE  MR Mean grad:  116.0 mmHg TR Peak grad:  25.1 mmHg  MR Vmax:     653.00 cm/s TR Vmax:    250.74 cm/s  MR Vmean:    517.0 cm/s  MR PISA:     5.09 cm  SHUNTS  MR PISA Eff ROA: 26 mm    Systemic VTI: 0.33 m  MR PISA Radius: 0.90 cm   Systemic Diam: 2.00 cm  MV E velocity: 134.33 cm/s   Eleonore Chiquito MD  Electronically signed by Eleonore Chiquito MD  Signature Date/Time: 03/13/2020/2:52:01 PM      RIGHT/LEFT HEART CATH AND CORONARY ANGIOGRAPHY  Conclusion    Angiographically Normal Coronary Arteries  Normal RHC pressures without evidence of Pulmonary HTN  Prominent PCWP V wave suggesting MR    Angiographically normal coronary arteries: Cloacal Left Main  Relatively normal Right Heart Cath Pressures: PAP 40/13 mmHg with a mean of 22 mmHg.  PCWP mean 14 mmHg with a V wave of 25 mmHg (suggesting mild regurgitation); LVEDP 15 mmHg.  Severe MR by Echo.  large PCWP V wave, but not dramatic  Normal Cardiac Output of 5.02 with mildly reduced Cardiac Index  2.45  Tortuous Innominate Artery and Brachiocephalic Vein.[name] Kitchen   RECOMMENDATIONS  Discharge home after bedrest  Continue with referral to CVTS  Event monitor has been ordered to assess for possible arrhythmias (concern for A. Fib)   Glenetta Hew, MD   Recommendations  Antiplatelet/Anticoag No indication for antiplatelet therapy at this time .  Discharge Date In the absence of any other complications or medical issues, we expect the patient to be ready for discharge from a cath perspective on 03/28/2020.  Surgeon Notes    03/13/2020 10:14 AM CV Procedure signed by Geralynn Rile, MD  Indications  Severe mitral regurgitation by prior echocardiogram [I34.0 (ICD-10-CM)]  Procedural Details  Technical Details Primary Care Provider: Maximiano Coss, NP Cardiologist: Glenetta Hew, MD  [place] is a 69 y.o. female with a PMH notable for HYPERTENSIVE HEART DISEASE, SEVERE MITRAL REGURGITATION /MILD AS, who was recently seen today for follow-up of Transesophageal Echocardiogram confirming severe MR.  As part of her referral for CVTS consultation, she now presents for right and left  heart catheterization.  Time Out: Verified patient identification, verified procedure, site/side was marked, verified correct patient position, special equipment/implants available, medications/allergies/relevent history reviewed, required imaging and test results available. Performed.  Access:  RIGHT Radial Artery: 6 Fr sheath -- Seldinger technique using Micropuncture Kit -- Direct ultrasound guidance used.  Permanent image obtained and placed on chart. 10 mL radial cocktail IA; 5,000 Units IV Heparin  * Right Brachial/Antecubital Vein: The existing 18-gauge IV was exchanged over a wire for a 6-7Fr Glide sheath  Right Heart Catheterization: 7 Fr Gordy Councilman catheter advanced under fluoroscopy with  balloon inflated to the RA, RV, then PCWP-PA for hemodynamic measurement.  * Simultaneous FA & PA blood gases checked for SaO2% to calculate FICK CO/CI  * Thermodilution Injections were not performed due to known severe MR. * After all measurements were obtained, the catheter removed completely out of the body with balloon deflated.  Left Heart Catheterization: A 5Fr TIG 4.0 catheter was advanced then removed over a J-wire under direct fluoroscopic guidance into the ascending aorta and used to engage first the right than the left coronary artery for selective selective coronary artery cineangiography.  Catheter then advanced across aortic valve with catheter ablation for management of the ventricular hemodynamics and pullback gradient.  LV gram was not performed because of transthoracic and transesophageal echocardiogram assessments.  Upon completion of Angiogaphy, the catheter was removed completely out of the body over a wire, without complication.  Brachial Sheath(s) removed in the Cath Lab with manual pressure for hemostasis.    Radial sheath removed in the Cardiac Catheterization lab with TR Band placed for hemostasis.  TR Band: 0835 Hours; 13 mL air  MEDICATIONS * SQ Lidocaine 46m * Radial  Cocktail: 3 mg Verapmil in 10 mL NS * Isovue Contrast: 45 mL * Heparin: 5000 Units Estimated blood loss <50 mL.   During this procedure medications were administered to achieve and maintain moderate conscious sedation while the patient's heart rate, blood pressure, and oxygen saturation were continuously monitored and I was present face-to-face 100% of this time.  Medications (Filter: Administrations occurring from 0719 to 0841 on 03/28/20) Heparin (Porcine) in NaCl 1000-0.9 UT/500ML-% SOLN (mL) Total volume:  1,000 mL Date/Time  Rate/Dose/Volume Action  03/28/20 0734  500 mL Given  0735  500 mL Given    midazolam (VERSED) injection (mg) Total dose:  1 mg Date/Time  Rate/Dose/Volume Action  03/28/20 0738  1 mg Given    fentaNYL (SUBLIMAZE) injection (mcg) Total dose:  25 mcg Date/Time  Rate/Dose/Volume Action  03/28/20 0738  25 mcg Given    lidocaine (PF) (XYLOCAINE) 1 % injection (mL) Total volume:  4 mL Date/Time  Rate/Dose/Volume Action  03/28/20 0750  2 mL Given  0753  2 mL Given    Radial Cocktail/Verapamil only (mL) Total volume:  10 mL Date/Time  Rate/Dose/Volume Action  03/28/20 0752  10 mL Given    iohexol (OMNIPAQUE) 350 MG/ML injection (mL) Total volume:  45 mL Date/Time  Rate/Dose/Volume Action  03/28/20 0833  45 mL Given    heparin sodium (porcine) injection (Units) Total dose:  5,000 Units Date/Time  Rate/Dose/Volume Action  03/28/20 0837  5,000 Units Given    Sedation Time  Sedation Time Physician-1: 48 minutes 58 seconds  Contrast  Medication Name Total Dose  iohexol (OMNIPAQUE) 350 MG/ML injection 45 mL    Radiation/Fluoro  Fluoro time: 13.1 (min) DAP: 18.5 (Gycm2) Cumulative Air Kerma: 3518.8(mGy)  Complications  Complications documented before study signed (03/28/2020 94:16AM)   No complications were associated with this study.  Documented by HLeonie Man MD - 03/28/2020 8:41 AM    Coronary Findings  Diagnostic Dominance:  Right Left Main  Vessel was injected. Vessel is large. Vessel is angiographically normal. The vessel originates from a separate ostium.  Left Anterior Descending  Vessel was injected. Vessel is normal in caliber and large. Vessel is angiographically normal.  Second Diagonal Branch  Vessel was injected. Vessel is small in size. Vessel is angiographically normal.  Third Diagonal Branch  Vessel was injected. Vessel is small in size.  Vessel is angiographically normal.  Ramus Intermedius  Vessel was injected. Vessel is normal in caliber. Vessel is angiographically normal.  Left Circumflex  Vessel was injected. Vessel is normal in caliber. Vessel is angiographically normal.  First Obtuse Marginal Branch  Vessel was injected. Vessel is small in size. Vessel is angiographically normal.  Second Obtuse Marginal Branch  Vessel was injected. Vessel is small in size. Vessel is angiographically normal.  Fourth Obtuse Marginal Branch  Vessel was injected. Vessel is small in size. Vessel is angiographically normal.  Left Posterior Atrioventricular Artery  Vessel was injected. Vessel is small in size. Vessel is angiographically normal.  Right Coronary Artery  Vessel was injected. Vessel is moderate in size. Vessel is angiographically normal.  Acute Marginal Branch  Vessel was injected. Vessel is small in size. Vessel is angiographically normal.  Right Ventricular Branch  Vessel was injected. Vessel is small in size. Vessel is angiographically normal.  Right Posterior Descending Artery  Vessel was injected. Vessel is small in size. Vessel is angiographically normal.  First Right Posterolateral Branch  Vessel was injected. Vessel is small in size. Vessel is angiographically normal.  Intervention  No interventions have been documented. Right Heart  Right Heart Pressures PAP-mean: 40/13 mmHg-22 mmHg PCWP mean 14 mm with a V wave of 25 mmHg. Elevated LV EDP consistent with volume overload. LVP-EDP:  133/10 mmHg - 15 mmHg AoP-MAP: 128/66 mmHg - 91 mmHg. Ao sat 100%, PA sat 70%.  Cardiac Output-Index Kathlen Brunswick): 5.02-2.45  Right Atrium Right atrial pressure is normal. Borderline elevated with a mean RAP of 8 mmHg.  Right Ventricle RVP-EDP: 43/3 mmHg - 8 mmHg.  Wall Motion  Resting    LV gram not performed        Left Heart  Left Ventricle No LV gram  Mitral Valve Not assessed LV gram, by Echo severe; prominent V wave on PCWP waveform would suggest significant MR.  Aortic Valve There is mild aortic valve stenosis. Read as mild by echo.  Minimal gradient by cath. The aortic valve is calcified.  Coronary Diagrams  Diagnostic Dominance: Right  Intervention  Implants   No implant documentation for this case.  Syngo Images  Show images for CARDIAC CATHETERIZATION Images on Long Term Storage  Show images for Nations, Juel Ripley "Miss Ernest" Link to Procedure Log  Procedure Log    Hemo Data   Most Recent Value  Fick Cardiac Output 5.02 L/min  Fick Cardiac Output Index 2.45 (L/min)/BSA  RA A Wave 11 mmHg  RA V Wave 8 mmHg  RA Mean 8 mmHg  RV Systolic Pressure 43 mmHg  RV Diastolic Pressure 3 mmHg  RV EDP 8 mmHg  PA Systolic Pressure 40 mmHg  PA Diastolic Pressure 13 mmHg  PA Mean 22 mmHg  PW A Wave 13 mmHg  PW V Wave 25 mmHg  PW Mean 14 mmHg  AO Systolic Pressure 825 mmHg  AO Diastolic Pressure 64 mmHg  AO Mean 86 mmHg  LV Systolic Pressure 053 mmHg  LV Diastolic Pressure 13 mmHg  LV EDP 15 mmHg  AOp Systolic Pressure 976 mmHg  AOp Diastolic Pressure 67 mmHg  AOp Mean Pressure 91 mmHg  LVp Systolic Pressure 734 mmHg  LVp Diastolic Pressure 10 mmHg  LVp EDP Pressure 13 mmHg  QP/QS 1  TPVR Index 8.97 HRUI  TSVR Index 35.1 HRUI  PVR SVR Ratio 0.1  TPVR/TSVR Ratio 0.26      Impression:  Patient has likely rheumatic heart disease with mild aortic  stenosis, moderate aortic insufficiency, and likely stage C severe asymptomatic primary mitral regurgitation.   She does not exercise at all on a regular basis and lives a somewhat sedentary lifestyle but denies any symptoms of significant exertional shortness of breath, resting shortness of breath, orthopnea, or lower extremity edema.  She reports occasional palpitations without dizziness.  She has no history of previous hospitalization for shortness of breath or congestive heart failure.  I have personally reviewed the patient's recent transthoracic and transesophageal echocardiograms and diagnostic cardiac catheterization.  The mitral valve is moderately thickened with type IIIa dysfunction causing significant restriction of the posterior leaflet between the P2 and P3 segments.  The jet of mitral regurgitation is central.  Estimated regurgitant volume performed at the time of TEE was greater than 40 mL.  There was no flow reversal in the pulmonary veins but systolic blunting.  There is moderate left atrial enlargement.  Left ventricular function appears normal.  The aortic valve is trileaflet with nodular retraction involving all 3 leaflets typical for patients with rheumatic disease.  There is a central jet of aortic insufficiency that appears likely moderate (2+) in severity.  Pressure half-time measured greater than 700 ms.  There was no systolic flow reversal in the descending aorta.  I agree the patient's mitral regurgitation appears severe.  There is very mild aortic stenosis and at most moderate aortic insufficiency.  Although the patient's mitral valve potentially may be repairable, in the setting of rheumatic disease the likelihood and durability of repair is probably somewhat reduced.  Although risks of surgery would be expected to be reasonably low, I would be reluctant to recommend proceeding with elective surgical intervention in the absence of significant symptomatology.   Plan:  The patient's multiple diagnostic tests will be reviewed by our multidisciplinary heart valve team.  For the time being we  plan to await results from the patient's ongoing 30-day event monitor to assess whether or not the patient may be having significant paroxysmal atrial fibrillation.  I favor proceeding with formal cardiopulmonary stress testing and stress echocardiography to better characterize whether or not the patient remains truly asymptomatic.  If her exercise tolerance is quite poor at this time one could make an argument for proceeding with elective mitral valve repair or replacement in the near future.  On the other hand, it would be reasonable to continue to follow the patient closely on medical therapy with serial echocardiograms on a more frequent basis.  The patient will return to our office for follow-up in 6 to 8 weeks once results of her cardiopulmonary stress testing and 30-day event monitor have been completed.   I spent in excess of 90 minutes during the conduct of this office consultation and >50% of this time involved direct face-to-face encounter with the patient for counseling and/or coordination of their care.      Valentina Gu. Roxy Manns, MD 04/01/2020 9:39 AM

## 2020-04-01 NOTE — Patient Instructions (Signed)
Continue all previous medications without any changes at this time  

## 2020-04-02 ENCOUNTER — Telehealth: Payer: Self-pay | Admitting: *Deleted

## 2020-04-02 NOTE — Telephone Encounter (Signed)
Dr. Roxy Manns had ordered a 30 day cardiac event monitor.  This is a duplicate order, Dr. Ellyn Hack had ordered the monitor back on 03/19/2020.  Patient was enrolled 03/19/2020 for [employer] to ship monitor to her home and instructions were sent to patient via My Chart.  According to [employer] website monitor status is still pending, probably because they were not able to contact patient to confirm the shipping address.  Patient given [employer] phone number to call and confirm shipping address today.

## 2020-04-05 ENCOUNTER — Ambulatory Visit (INDEPENDENT_AMBULATORY_CARE_PROVIDER_SITE_OTHER): Payer: PPO

## 2020-04-05 DIAGNOSIS — R002 Palpitations: Secondary | ICD-10-CM

## 2020-04-05 DIAGNOSIS — Z01818 Encounter for other preprocedural examination: Secondary | ICD-10-CM | POA: Diagnosis not present

## 2020-04-05 DIAGNOSIS — I340 Nonrheumatic mitral (valve) insufficiency: Secondary | ICD-10-CM

## 2020-04-08 ENCOUNTER — Telehealth: Payer: Self-pay | Admitting: Cardiology

## 2020-04-08 NOTE — Telephone Encounter (Signed)
Patient wants to know if her appt on 12/16 is still needed. She states that she is still wearing a heart monitor and her stress echo is not scheduled until 01/13. Please advise if appt should be moved out further.

## 2020-04-08 NOTE — Telephone Encounter (Signed)
Spoke with the patient. She recently had an appointment with Dr. Roxy Manns where a cardiac monitor and stress echo were ordered. She is due to follow up with Dr. Roxy Manns on 05/20/20 after the monitor and echo. She wants to know if the post cath appointment is needed with Dr. Ellyn Hack on 04/11/20

## 2020-04-09 NOTE — Telephone Encounter (Signed)
Spoke to patient . She aware to call tomorrow afternoon if she is still doing well and cancel 12/16 appointment.. Patient states she is not having any issue from cath procedure   She verbalized  Understanding and states she will call back about 1 pm

## 2020-04-09 NOTE — Telephone Encounter (Signed)
As long she is not having any issues with the access sites from the cath, but everything is okay.  I think we are okay holding off on the appointment.  Would not cancel until she is indeed not having any potential post-cath issues.  Glenetta Hew, MD

## 2020-04-11 ENCOUNTER — Ambulatory Visit: Payer: PPO | Admitting: Cardiology

## 2020-05-05 ENCOUNTER — Other Ambulatory Visit: Payer: Self-pay | Admitting: Nurse Practitioner

## 2020-05-05 DIAGNOSIS — I10 Essential (primary) hypertension: Secondary | ICD-10-CM

## 2020-05-06 NOTE — Telephone Encounter (Signed)
   Notes to clinic:  medication filled by a different provider  Review for refill   Requested Prescriptions  Pending Prescriptions Disp Refills   valsartan (DIOVAN) 320 MG tablet [Pharmacy Med Name: Valsartan 320 MG Oral Tablet] 90 tablet 0    Sig: Take 1 tablet by mouth once daily      Cardiovascular:  Angiotensin Receptor Blockers Failed - 05/05/2020  3:48 PM      Failed - Last BP in normal range    BP Readings from Last 1 Encounters:  04/01/20 (!) 154/86          Passed - Cr in normal range and within 180 days    Creat  Date Value Ref Range Status  01/10/2016 0.85 0.50 - 0.99 mg/dL Final    Comment:      For patients > or = 70 years of age: The upper reference limit for Creatinine is approximately 13% higher for people identified as African-American.      Creatinine, Ser  Date Value Ref Range Status  03/08/2020 0.88 0.57 - 1.00 mg/dL Final          Passed - K in normal range and within 180 days    Potassium  Date Value Ref Range Status  03/28/2020 3.7 3.5 - 5.1 mmol/L Final  03/28/2020 3.7 3.5 - 5.1 mmol/L Final          Passed - Patient is not pregnant      Passed - Valid encounter within last 6 months    Recent Outpatient Visits           1 month ago    [facility], [place], NP   7 months ago Abdominal pain, unspecified abdominal location   [facility], Delfino Lovett, NP   9 months ago Urinary frequency   [facility] at [facility], Arlie Solomons, MD   10 months ago Frequent headaches   [facility] at [facility], Arlie Solomons, MD   1 year ago Need for influenza vaccination   [facility] at [facility], Lilia Argue, MD       Future Appointments             In 1 month Maximiano Coss, NP [facility] at [place]

## 2020-05-09 ENCOUNTER — Ambulatory Visit (HOSPITAL_COMMUNITY)
Admission: RE | Admit: 2020-05-09 | Discharge: 2020-05-09 | Disposition: A | Discharge status: Home / Self Care | Payer: PPO | Source: Ambulatory Visit | Attending: Thoracic Surgery (Cardiothoracic Vascular Surgery) | Admitting: Thoracic Surgery (Cardiothoracic Vascular Surgery)

## 2020-05-09 ENCOUNTER — Other Ambulatory Visit: Payer: Self-pay

## 2020-05-09 ENCOUNTER — Other Ambulatory Visit (HOSPITAL_COMMUNITY): Payer: Self-pay | Admitting: *Deleted

## 2020-05-09 ENCOUNTER — Ambulatory Visit (HOSPITAL_COMMUNITY): Payer: PPO

## 2020-05-09 DIAGNOSIS — D649 Anemia, unspecified: Secondary | ICD-10-CM | POA: Diagnosis not present

## 2020-05-09 DIAGNOSIS — I340 Nonrheumatic mitral (valve) insufficiency: Secondary | ICD-10-CM | POA: Insufficient documentation

## 2020-05-09 DIAGNOSIS — I051 Rheumatic mitral insufficiency: Secondary | ICD-10-CM | POA: Diagnosis not present

## 2020-05-09 DIAGNOSIS — J45909 Unspecified asthma, uncomplicated: Secondary | ICD-10-CM | POA: Diagnosis not present

## 2020-05-09 HISTORY — PX: OTHER SURGICAL HISTORY: SHX169

## 2020-05-09 LAB — ECHOCARDIOGRAM STRESS TEST
MV M vel: 5.92 m/s
MV Peak grad: 140 mmHg
P 1/2 time: 512 msec
Radius: 0.9 cm

## 2020-05-20 ENCOUNTER — Encounter: Payer: Self-pay | Admitting: Thoracic Surgery (Cardiothoracic Vascular Surgery)

## 2020-05-20 ENCOUNTER — Encounter: Payer: Self-pay | Admitting: Registered Nurse

## 2020-05-20 ENCOUNTER — Other Ambulatory Visit: Payer: Self-pay

## 2020-05-20 ENCOUNTER — Ambulatory Visit (INDEPENDENT_AMBULATORY_CARE_PROVIDER_SITE_OTHER): Payer: PPO | Admitting: Thoracic Surgery (Cardiothoracic Vascular Surgery)

## 2020-05-20 VITALS — BP 138/71 | HR 65 | Resp 20

## 2020-05-20 DIAGNOSIS — I061 Rheumatic aortic insufficiency: Secondary | ICD-10-CM | POA: Diagnosis not present

## 2020-05-20 DIAGNOSIS — I051 Rheumatic mitral insufficiency: Secondary | ICD-10-CM | POA: Diagnosis not present

## 2020-05-20 NOTE — Patient Instructions (Signed)

## 2020-05-20 NOTE — Progress Notes (Signed)
Established Patient Office Visit  Subjective:  Patient ID: Lisa Miles, female    DOB: Feb 02, 1951  Age: 70 y.o. MRN: [id number]  CC:  Chief Complaint  Patient presents with  . Medication Refill  . Medical Management of Chronic Issues    5 m f/u     HPI [name] presents for follow up on chronic medical issues.  Feeling stable. No new history. No new complaints. Does not think she needs medications refilled at this time  States that she does not feel that she has experienced much decline in status. Feeling well overall   Histories reviewed with patient as they are somewhat extensive.  Past Medical History:  Diagnosis Date  . Aortic stenosis, mild-moderate 07/2012   TTE October 21: Moderate aortic valve thickening with mild to moderate AI & mild AS; TEE: mild to moderate AI & MIld AS  . Arthritis    Phreesia 11/06/2019  . Asthma   . Bilateral bunions    Bunionectomies performed  . Bronchitis, chronic ([place])   . Cataract    Phreesia 11/06/2019  . Heart disease   . Hyperlipidemia   . Hypertension    Good control  . Inguinodynia    Bilateral groin pain  . Lower extremity edema    Chronic. Venous Duplex 11/06/11 SUMMARY: 1) Bilateral Lower Extremities: No evidence of DVT or thrombophlebitis.  2) Right Common Femoral Vein: Demonstrated mild valvular insufficiency with a greater than (1) sec of duration. Mildly abnormal LE Venous duplex Doppler.  . Obesity   . Palpitations    Relatively well controlled  . Scoliosis    DG Chest 2 View x-ray on 07/30/11 by Dr. Everlene Farrier shows a scoliosis.  . Severe mitral regurgitation by prior echocardiogram 07/2012   Mild AMVL prolapse, Mod MR -- no MVP noted in 01/2018 -> 11/'21: Severe MR due to restricted movement of P3 scallop of PMVL (IIIB) due to incomplete leaflet coaptation -> thickened leaflets consistent with Rheumatic Heart Valve Disease.    Past Surgical History:  Procedure Laterality Date  . ABDOMINAL HYSTERECTOMY    .  ANKLE ARTHROSCOPY Left   . BUNIONECTOMY Bilateral   . EYE SURGERY N/A    Phreesia 11/06/2019  . KNEE ARTHROSCOPY Right   . Left knee open surgery     pt thinks she only had shots in L knee, not surgery  . RIGHT/LEFT HEART CATH AND CORONARY ANGIOGRAPHY N/A 03/28/2020   Procedure: RIGHT/LEFT HEART CATH AND CORONARY ANGIOGRAPHY;  Surgeon: Leonie Man, MD;  Location: [facility];  Service: Cardiovascular;  Laterality: N/A;  . TEE WITHOUT CARDIOVERSION N/A 03/13/2020   Procedure: TRANSESOPHAGEAL ECHOCARDIOGRAM (TEE);  Surgeon: Geralynn Rile, MD;  [facility] ENDOSCOPY;;; Severe MR 2/2 restricted P3 scallop of PMVL (IIIB) w/ incomplete leaflet coaptation.  Thickened /degenerative leaflets c/w Rheumatic Valvular Heart Disease.  2D PISA radius 0.9 cm with 2D ERO 0.26 cm2 with R Vol 62 cc. PV blunting w/ BP 109/60 mmHg. No MS. Mild-mod AI / Mild AS. Gr 2 plaque Aorta  . TOTAL ABDOMINAL HYSTERECTOMY    . TRANSTHORACIC ECHOCARDIOGRAM  07/2012; 01/2013   a) 4/'14: mild-mod AS, Mod AI (mean/peak gradient: 18 mmHg / 31 mmhg).  Mild MVP w/ Mod MR, no MS.  Mildly elevated PAP (~37 mmHg) w/ Mod TR. EF 55-60%. GR 1 DD.;; b). 01/2013: EF 60-65%. No RWMA. Gr  DD. Mild AS (M / P Gradient 20 mmHg/34 mmHg). Mild AMVL Prolapse w/ Mod MR. Mod LA dilation.  Mod TR.  [name] Kitchen TRANSTHORACIC ECHOCARDIOGRAM  01/2015; 01/2016   a) Normal LV size and function. GR 1 DD. Moderate LA Dilation. Calcified aortic valve with mild AS, moderate (2+) AI. Restricted posterior MV leaflet movement w/ Mod MR. Mode TR with mod elevated PA pressures (45 mmHg);; b) Relatively stable. Moderate concentric LVH. Moderate AI with mild AS (Mean-PeaK Gradient: 19 / 36 mmHg). Mild MR. Mod LA dilation. PAP ~40 mmHg -> plan follow-up 2020.  [name] Kitchen TRANSTHORACIC ECHOCARDIOGRAM  01/2018   Normal LV size.  Moderate concentric LVH.  EF 60 to 65%.  No R WMA.  GR 1 DD (high filling pressures).  Mild AS (mean gradient 16 mmHg) with moderate AI.  Severe LA dilation.   Mild MR.  . TRANSTHORACIC ECHOCARDIOGRAM  01/31/2020    EF 65 to 70% with normal wall motion..  Severe MR with restricted PMVL movement.  (Recommend TEE).  Moderate aortic valve thickening with mild to moderate stenosis, and moderate aortic regurgitation.  Normal RV function.  Mildly elevated pressures.  Severe LA dilation.     Family History  Problem Relation Age of Onset  . Arthritis Mother   . Hyperlipidemia Mother   . Diabetes Mother   . Gout Mother   . Hypertension Mother   . Cancer Father        GI cancer  . Diabetes Maternal Grandfather   . Hypertension Brother   . Diabetes Brother   . Hypertension Brother   . Migraines Neg Hx   . Headache Neg Hx     Social History   Socioeconomic History  . Marital status: Married    Spouse name: Herbie Baltimore  . Number of children: 2  . Years of education: Not on file  . Highest education level: Not on file  Occupational History  . Occupation: production    Employer: [employer]    Comment: check printing/shipping  Tobacco Use  . Smoking status: Never Smoker  . Smokeless tobacco: Never Used  Vaping Use  . Vaping Use: Never used  Substance and Sexual Activity  . Alcohol use: No  . Drug use: No  . Sexual activity: Never  Other Topics Concern  . Not on file  Social History Narrative   Lives with her husband.  Their children live nearby.   Right handed   Caffeine: 1 cup/day   Social Determinants of Health   Financial Resource Strain: Not on file  Food Insecurity: Not on file  Transportation Needs: Not on file  Physical Activity: Not on file  Stress: Not on file  Social Connections: Not on file  Intimate Partner Violence: Not on file    Outpatient Medications Prior to Visit  Medication Sig Dispense Refill  . Calcium Carbonate+Vitamin D 600-200 MG-UNIT TABS Take 2 tablets by mouth daily.    . Cholecalciferol (VITAMIN D) 50 MCG (2000 UT) CAPS Take 2,000 Units by mouth daily.    [name] Kitchen esomeprazole (NEXIUM) 20 MG capsule Take  1 capsule (20 mg total) by mouth 2 (two) times daily before a meal. 60 capsule 3  . estradiol (ESTRACE) 2 MG tablet Take 2 mg by mouth daily.    . hydroxypropyl methylcellulose / hypromellose (ISOPTO TEARS / GONIOVISC) 2.5 % ophthalmic solution Place 1 drop into both eyes in the morning and at bedtime.    [name] Kitchen aspirin 81 MG tablet Take 81 mg by mouth daily.     . valsartan (DIOVAN) 320 MG tablet Take 1 tablet by mouth once daily (Patient taking differently: Take  320 mg by mouth daily. ) 90 tablet 0   No facility-administered medications prior to visit.    No Known Allergies  ROS Review of Systems  Constitutional: Negative.   HENT: Negative.   Eyes: Negative.   Respiratory: Negative.   Cardiovascular: Negative.   Gastrointestinal: Negative.   Genitourinary: Negative.   Musculoskeletal: Negative.   Skin: Negative.   Neurological: Negative.   Psychiatric/Behavioral: Negative.   All other systems reviewed and are negative.     Objective:    Physical Exam Vitals and nursing note reviewed.  Constitutional:      General: She is not in acute distress.    Appearance: Normal appearance. She is normal weight. She is not ill-appearing, toxic-appearing or diaphoretic.  Cardiovascular:     Rate and Rhythm: Normal rate and regular rhythm.     Heart sounds: Normal heart sounds. No murmur heard. No friction rub. No gallop.   Pulmonary:     Effort: Pulmonary effort is normal. No respiratory distress.     Breath sounds: Normal breath sounds. No stridor. No wheezing, rhonchi or rales.  Chest:     Chest wall: No tenderness.  Skin:    General: Skin is warm and dry.  Neurological:     General: No focal deficit present.     Mental Status: She is alert and oriented to Gauss, place, and time. Mental status is at baseline.  Psychiatric:        Mood and Affect: Mood normal.        Behavior: Behavior normal.        Thought Content: Thought content normal.        Judgment: Judgment normal.      Ht 5\' 6"  (1.676 m)   Wt 212 lb (96.2 kg)   BMI 34.22 kg/m  Wt Readings from Last 3 Encounters:  04/01/20 207 lb 9.6 oz (94.2 kg)  03/28/20 212 lb (96.2 kg)  03/15/20 210 lb 9.6 oz (95.5 kg)     Health Maintenance Due  Topic Date Due  . INFLUENZA VACCINE  11/26/2019  . COVID-19 Vaccine (3 - Booster for Moderna series) 01/08/2020    There are no preventive care reminders to display for this patient.  Lab Results  Component Value Date   TSH 1.770 08/09/2019   Lab Results  Component Value Date   WBC 3.6 03/08/2020   HGB 11.6 (L) 03/28/2020   HGB 11.6 (L) 03/28/2020   HCT 34.0 (L) 03/28/2020   HCT 34.0 (L) 03/28/2020   MCV 89 03/08/2020   PLT 259 03/08/2020   Lab Results  Component Value Date   NA 144 03/28/2020   NA 143 03/28/2020   K 3.7 03/28/2020   K 3.7 03/28/2020   CO2 25 03/08/2020   GLUCOSE 86 03/08/2020   BUN 12 03/08/2020   CREATININE 0.88 03/08/2020   BILITOT 0.2 10/04/2019   ALKPHOS 53 10/04/2019   AST 16 10/04/2019   ALT 13 10/04/2019   PROT 6.6 10/04/2019   ALBUMIN 3.7 (L) 10/04/2019   CALCIUM 9.5 03/08/2020   ANIONGAP 14 03/29/2014   GFR 98.74 05/23/2014   Lab Results  Component Value Date   CHOL 180 07/06/2018   Lab Results  Component Value Date   HDL 68 07/06/2018   Lab Results  Component Value Date   LDLCALC 91 07/06/2018   Lab Results  Component Value Date   TRIG 104 07/06/2018   Lab Results  Component Value Date   CHOLHDL 2.6 07/06/2018  Lab Results  Component Value Date   HGBA1C 4.6 08/02/2019      Assessment & Plan:   Problem List Items Addressed This Visit      Cardiovascular and Mediastinum   Essential hypertension (Chronic)     Genitourinary   OAB (overactive bladder) - Primary     Other   Dyslipidemia (Chronic)   Heart palpitations (Chronic)   Obesity (BMI 30-39.9)      No orders of the defined types were placed in this encounter.   Follow-up: No follow-ups on file.   PLAN  Pt  stable  No labs warranted at this time  Resume 6 mo follow up schedule  Patient encouraged to call clinic with any questions, comments, or concerns.  Maximiano Coss, NP

## 2020-05-20 NOTE — Progress Notes (Signed)
[address]       [place] [address]             [phone]     CARDIOTHORACIC SURGERY OFFICE NOTE  Referring Provider is Leonie Man, MD PCP is Maximiano Coss, NP   HPI:  Patient is 70 year old moderately obese African-American female originally from [place] with history of likely rheumatic heart disease with mild aortic stenosis, mild to moderate aortic insufficiency, mitral regurgitation, hypertension, asthma, bronchitis, cough, anemia, and GE reflux disease who returns to the office today for follow-up.  She was originally seen in consultation on April 01, 2020.  Since then her diagnostic tests have all been reviewed by a multidisciplinary team of specialists.  The patient also underwent stress echocardiogram and formal cardiopulmonary exercise testing.  She returns to the office today to discuss the results of these tests and further discuss treatment options.  She reports that she continues to feel well.  She specifically denies any symptoms of exertional shortness of breath or chest discomfort.  She has had some intermittent mild right lower extremity edema off and on in the past but otherwise she has no symptoms whatsoever.   Current Outpatient Medications  Medication Sig Dispense Refill  . amoxicillin (AMOXIL) 500 MG capsule Take 2,000 mg by mouth See admin instructions. Take 4 capsules (2000 mg) by mouth 1 hour prior to dental appointments    . aspirin EC 81 MG tablet Take 81 mg by mouth daily. Swallow whole.    . Calcium Carbonate+Vitamin D 600-200 MG-UNIT TABS Take 2 tablets by mouth daily.    . Cholecalciferol (VITAMIN D) 50 MCG (2000 UT) CAPS Take 2,000 Units by mouth daily.    [name] Kitchen esomeprazole (NEXIUM) 20 MG capsule Take 1 capsule (20 mg total) by mouth 2 (two) times daily before a meal. 60 capsule 3  . estradiol (ESTRACE) 2 MG tablet Take 2 mg by mouth daily.    . hydroxypropyl methylcellulose / hypromellose (ISOPTO TEARS / GONIOVISC) 2.5 %  ophthalmic solution Place 1 drop into both eyes in the morning and at bedtime.    . valsartan (DIOVAN) 320 MG tablet Take 1 tablet (320 mg total) by mouth daily. 30 tablet 0   No current facility-administered medications for this visit.      Physical Exam:   BP 138/71 (BP Location: Left Arm, Patient Position: Sitting)   Pulse 65   Resp 20   SpO2 94% Comment: RA  General:  Well-appearing  Chest:   Clear to auscultation  CV:   Regular rate and rhythm with grade 2/6 systolic murmur  Incisions:  n/a  Abdomen:  Soft nontender  Extremities:  Warm and well-perfused, no edema  Diagnostic Tests:  EVENT MONITOR:  Automatically Detected Events: 1 Stable: Sinus Bradycardia, Sinus Rhythm w/Run of V-Tach (5 beats)/MF PVCs (12 in 1 min)/PACs 1 Stable: Sinus Bradycardia w/Run of V-Tach (4 beats)/PVCs (3 in 1 min)/PACs 1 Stable: Sinus Rhythm w/PSVT (16 Sec)/PVCs (10 in 1 min)/PACs/Artifact 1 Stable: Sinus Rhythm w/Run of V-Tach (4 Beats)/Atrial Run/PVCs (4 in 1 min)/PACs/Artifact   CARDIOPULMONARY EXERCISE TEST: Referred for: Functional evaluation of Mitral regurgitation   Procedure: This patient underwent staged symptom-limited exercise testing using an individualized bike protocol with expired gas analysis metabolic evaluation during exercise.   Demographics   Age: 23 Ht. (in.) 87 Wt. (lb) 202.8 BMI: 32.7   Predicted Peak VO2: 14.8   Gender: Female Ht (cm) 167.6 Wt. (kg) 92    Results  Pre-Exercise PFTs   FVC 2.27 (85%)     FEV1 1.99 (96%)      FEV1/FVC 88 (111%)      MVV 79 (87%)      Exercise Time:  8:45  Watts: 80   RPE: 15   Reason stopped: leg fatigue   Additional symptoms: dyspnea (5/10)   Resting HR: 66 Peak HR: 133  (88% age predicted max HR)   BP rest: 110/64 BP peak: 164/60   Peak VO2: 15.1 (102% predicted peak VO2)   VE/VCO2 slope: 29   OUES: 1.67   Peak RER: 1.20   Ventilatory Threshold: 11.2 (76% predicted or measured  peak VO2)   Peak RR 41   Peak Ventilation: 53.2   VE/MVV: 67%   PETCO2 at peak: 36   O2pulse: 10  (116% predicted O2pulse)    Interpretation   Notes: Patient gave a very good effort. Pulse-oximetry remained 99-100% for the duration of exercise. Exercise was performed on a cycle ergometer starting at [facility] and increasing by 10W/min. Echo was also performed at rest and immediately post-exercise.   ECG: Resting ECG in normal sinus rhythm. HR response appropriate. There were no sustained arrhythmias or ST-T changes. BP response appropriate. There were occasional isolated PACs with no sustained arrhythmias or ST-T Changes. BP response appropriate.   PFT: Pre-exercise spirometry was within normal limits. The MVV was normal.   CPX: Exercise testing with gas exchange demonstrates a normal peak VO2 of 15.1 ml/kg/min (102% of the age/gender/weight matched sedentary norms). The RER of 1.20 indicates a maximal effort. When adjusted to the patient's ideal body weight of 145.9 lb (66.2 kg) the peak VO2 is 21.0 ml/kg (ibw)/min (115% of the ibw-adjusted predicted). The VE/VCO2 slope is normal. The oxygen uptake efficiency slope (OUES) is normal. The VO2 at the ventilatory threshold was normal at 76% of the predicted peak VO2. At peak exercise, the ventilation reached 67% of the measured MVV indicating ventilatory reserve remained. The O2pulse (a surrogate for stroke volume) increased with incremental exercise, reaching peak at 10 ml/beat (116% predicted).    Conclusion: Exercise testing with gas exchange demonstrates normal functional capacity when compared to matched sedentary norms. There is no indication for cardiopulmonary limitation. Patient's body habitus is contributing to exercise intolerance.    Test, report and preliminary impression by:  Landis Martins, MS, ACSM-RCEP  05/09/2020 12:15 PM    Attending: Agree with above. No obvious cardiopulmonary abnormality. Her obesity is contributing  to her exercise intolerance.   Glori Bickers, MD  5:03 PM        EXERCISE STRESS ECHO REPORT    ---------------------------------------------------------------------------  -----   Patient Name:  Lisa Miles Date of Exam: 05/09/2020  Medical Rec #: [id number]    Height:    66.0 in  Accession #:  [id number]   Weight:    207.6 lb  Date of Birth: 05-27-1950    BSA:     2.032 m  Patient Age:  1 years    BP:      110/64 mmHg  Patient Gender: F        HR:      60 bpm.  Exam Location: Outpatient   Procedure: 2D Echo, Cardiac Doppler and Color Doppler   Indications:  I05.1 Rheumatic mitral insufficiency    History:    Patient has prior history of Echocardiogram examinations,  most         recent 03/13/2020. Asthma, Bronchitis, Anemia.    Sonographer:  Darlina Sicilian  RDCS  Referring Phys: [place] Comments: 06.01.2017 Patient authorizes all [facility] practices to  release information to husband, Herbie Baltimore [phone] or daughter Alexianna Nachreiner, [phone] and to leave detailed message on home voicemail.  IMPRESSIONS    1. Limited images performed during CPX study ECG and CPX portion of study  separately interpreted. TTE shows normal EF 65% with LVH No pericardial  effusion Normal RV size and function Moderate appearing rheumatic MR/AR.  2. This is a low risk study.   Additional Findings: Limited images performed during CPX study ECG and CPX  portion of study separately interpreted. TTE shows normal EF 65% with LVH  No pericardial effusion Normal RV size and function Moderate appearing  rheumatic MR/AR.     Jenkins Rouge MD  Electronically signed on 05/09/2020 at 12:17:35 PM       Impression:  Patient has rheumatic heart disease with very mild aortic stenosis, moderate aortic insufficiency, and moderate to severe primary mitral regurgitation.  She remains entirely  asymptomatic.  Recent formal cardiopulmonary exercise testing revealed no sign of significant cardiopulmonary limitation with normal functional capacity.  Stress echocardiogram revealed findings consistent with moderate aortic insufficiency and moderate mitral regurgitation that did not get worse with exercise.  Event monitor revealed mostly sinus rhythm with no documented episodes of atrial fibrillation.  Under the circumstances I would not recommend surgical intervention at this time.  I favor continued close observation on medical therapy with follow-up echocardiograms performed at least on an annual basis.   Plan:  I discussed the results of the patient's multiple diagnostic tests with the patient in the office this afternoon.  I reassured the patient that at this point time there are no clear indications for any type of surgical intervention.  I have stressed the importance of close follow-up and surveillance on medical therapy.  She will continue follow-up regularly with Dr. Ellyn Hack.  I have offered her the opportunity to return to see me for follow-up in 1 year if desired.  All questions have been answered.    I spent in excess of 15 minutes during the conduct of this office consultation and >50% of this time involved direct face-to-face encounter with the patient for counseling and/or coordination of their care.    Valentina Gu. Roxy Manns, MD 05/20/2020 10:45 AM

## 2020-06-01 ENCOUNTER — Telehealth: Payer: Self-pay | Admitting: Registered Nurse

## 2020-06-01 DIAGNOSIS — I10 Essential (primary) hypertension: Secondary | ICD-10-CM

## 2020-06-11 ENCOUNTER — Other Ambulatory Visit: Payer: Self-pay

## 2020-06-11 ENCOUNTER — Encounter: Payer: Self-pay | Admitting: Registered Nurse

## 2020-06-11 ENCOUNTER — Ambulatory Visit (INDEPENDENT_AMBULATORY_CARE_PROVIDER_SITE_OTHER): Payer: PPO | Admitting: Registered Nurse

## 2020-06-11 VITALS — BP 144/77 | HR 63 | Temp 97.6°F | Resp 18 | Ht 66.0 in | Wt 202.8 lb

## 2020-06-11 DIAGNOSIS — Z7689 Persons encountering health services in other specified circumstances: Secondary | ICD-10-CM

## 2020-06-11 DIAGNOSIS — R1310 Dysphagia, unspecified: Secondary | ICD-10-CM | POA: Diagnosis not present

## 2020-06-11 DIAGNOSIS — R198 Other specified symptoms and signs involving the digestive system and abdomen: Secondary | ICD-10-CM | POA: Diagnosis not present

## 2020-06-11 DIAGNOSIS — E785 Hyperlipidemia, unspecified: Secondary | ICD-10-CM

## 2020-06-11 DIAGNOSIS — R0989 Other specified symptoms and signs involving the circulatory and respiratory systems: Secondary | ICD-10-CM

## 2020-06-11 DIAGNOSIS — I10 Essential (primary) hypertension: Secondary | ICD-10-CM

## 2020-06-11 MED ORDER — ESOMEPRAZOLE MAGNESIUM 20 MG PO CPDR: 20.0000 mg | DELAYED_RELEASE_CAPSULE | Freq: Two times a day (BID) | ORAL | 3 refills | Status: DC

## 2020-06-11 MED ORDER — SUCRALFATE 1 GM/10ML PO SUSP: 1.0000 g | Freq: Three times a day (TID) | ORAL | 0 refills | Status: DC

## 2020-06-11 NOTE — Progress Notes (Signed)
Established Patient Office Visit  Subjective:  Patient ID: Lisa Miles, female    DOB: October 26, 1950  Age: 70 y.o. MRN: [id number]  CC:  Chief Complaint  Patient presents with  . Transitions Of Care    Patient states she is here for TOC. Per patient she wakes up with an headaches and body aches. Also patient also would like to discuss having problems swallowing her food.     [name] presents for visit to est care.  Histories reviewed, updated with patient as warranted  Today, c/o dysphagia and headaches  Dysphagia: Ongoing for some time, but distinctly worse in past 3-4 weeks. Happens with solid foods Gestures to cervical region States that she has not had vomiting or nausea Liquids are ok Admits she has not been taking her esomeprazole for a while - lost her last rx  Headaches: On waking Most days Not using CPAP - though sleep study indicates apnea not likely contributory to her headaches. Resolve within 1-2 hours of waking Some body aches along with these No other symptoms Onset 1 mo ago but improving.   Of note:  Recent cardiologist appt stress test and echo in early 2022 showing that her mitral valve regurgitation is severe, cardiac function stable, no acute concerns noted at that time.   Past Medical History:  Diagnosis Date  . Aortic stenosis, mild-moderate 07/2012   TTE October 21: Moderate aortic valve thickening with mild to moderate AI & mild AS; TEE: mild to moderate AI & MIld AS  . Arthritis    Phreesia 11/06/2019  . Asthma   . Bilateral bunions    Bunionectomies performed  . Bronchitis, chronic ([place])   . Cataract    Phreesia 11/06/2019  . Heart disease   . Hyperlipidemia   . Hypertension    Good control  . Inguinodynia    Bilateral groin pain  . Lower extremity edema    Chronic. Venous Duplex 11/06/11 SUMMARY: 1) Bilateral Lower Extremities: No evidence of DVT or thrombophlebitis.  2) Right Common Femoral Vein: Demonstrated mild  valvular insufficiency with a greater than (1) sec of duration. Mildly abnormal LE Venous duplex Doppler.  . Obesity   . Palpitations    Relatively well controlled  . Scoliosis    DG Chest 2 View x-ray on 07/30/11 by Dr. Everlene Farrier shows a scoliosis.  . Severe mitral regurgitation by prior echocardiogram 07/2012   Mild AMVL prolapse, Mod MR -- no MVP noted in 01/2018 -> 11/'21: Severe MR due to restricted movement of P3 scallop of PMVL (IIIB) due to incomplete leaflet coaptation -> thickened leaflets consistent with Rheumatic Heart Valve Disease.    Past Surgical History:  Procedure Laterality Date  . ABDOMINAL HYSTERECTOMY    . ANKLE ARTHROSCOPY Left   . BUNIONECTOMY Bilateral   . EYE SURGERY N/A    Phreesia 11/06/2019  . KNEE ARTHROSCOPY Right   . Left knee open surgery     pt thinks she only had shots in L knee, not surgery  . RIGHT/LEFT HEART CATH AND CORONARY ANGIOGRAPHY N/A 03/28/2020   Procedure: RIGHT/LEFT HEART CATH AND CORONARY ANGIOGRAPHY;  Surgeon: Leonie Man, MD;  Location: [facility];  Service: Cardiovascular;  Laterality: N/A;  . TEE WITHOUT CARDIOVERSION N/A 03/13/2020   Procedure: TRANSESOPHAGEAL ECHOCARDIOGRAM (TEE);  Surgeon: Geralynn Rile, MD;  [facility] ENDOSCOPY;;; Severe MR 2/2 restricted P3 scallop of PMVL (IIIB) w/ incomplete leaflet coaptation.  Thickened /degenerative leaflets c/w Rheumatic Valvular Heart Disease.  2D PISA radius 0.9 cm with 2D ERO 0.26 cm2 with R Vol 62 cc. PV blunting w/ BP 109/60 mmHg. No MS. Mild-mod AI / Mild AS. Gr 2 plaque Aorta  . TOTAL ABDOMINAL HYSTERECTOMY    . TRANSTHORACIC ECHOCARDIOGRAM  07/2012; 01/2013   a) 4/'14: mild-mod AS, Mod AI (mean/peak gradient: 18 mmHg / 31 mmhg).  Mild MVP w/ Mod MR, no MS.  Mildly elevated PAP (~37 mmHg) w/ Mod TR. EF 55-60%. GR 1 DD.;; b). 01/2013: EF 60-65%. No RWMA. Gr  DD. Mild AS (M / P Gradient 20 mmHg/34 mmHg). Mild AMVL Prolapse w/ Mod MR. Mod LA dilation. Mod TR.  [name] Kitchen TRANSTHORACIC  ECHOCARDIOGRAM  01/2015; 01/2016   a) Normal LV size and function. GR 1 DD. Moderate LA Dilation. Calcified aortic valve with mild AS, moderate (2+) AI. Restricted posterior MV leaflet movement w/ Mod MR. Mode TR with mod elevated PA pressures (45 mmHg);; b) Relatively stable. Moderate concentric LVH. Moderate AI with mild AS (Mean-PeaK Gradient: 19 / 36 mmHg). Mild MR. Mod LA dilation. PAP ~40 mmHg -> plan follow-up 2020.  [name] Kitchen TRANSTHORACIC ECHOCARDIOGRAM  01/2018   Normal LV size.  Moderate concentric LVH.  EF 60 to 65%.  No R WMA.  GR 1 DD (high filling pressures).  Mild AS (mean gradient 16 mmHg) with moderate AI.  Severe LA dilation.  Mild MR.  . TRANSTHORACIC ECHOCARDIOGRAM  01/31/2020    EF 65 to 70% with normal wall motion..  Severe MR with restricted PMVL movement.  (Recommend TEE).  Moderate aortic valve thickening with mild to moderate stenosis, and moderate aortic regurgitation.  Normal RV function.  Mildly elevated pressures.  Severe LA dilation.     Family History  Problem Relation Age of Onset  . Arthritis Mother   . Hyperlipidemia Mother   . Diabetes Mother   . Gout Mother   . Hypertension Mother   . Cancer Father        GI cancer  . Diabetes Maternal Grandfather   . Hypertension Brother   . Diabetes Brother   . Hypertension Brother   . Migraines Neg Hx   . Headache Neg Hx     Social History   Socioeconomic History  . Marital status: Married    Spouse name: Herbie [place]  . Number of children: 2  . Years of education: Not on file  . Highest education level: Not on file  Occupational History  . Occupation: production    Employer: [employer]    Comment: check printing/shipping  Tobacco Use  . Smoking status: Never Smoker  . Smokeless tobacco: Never Used  Vaping Use  . Vaping Use: Never used  Substance and Sexual Activity  . Alcohol use: No  . Drug use: No  . Sexual activity: Never  Other Topics Concern  . Not on file  Social History Narrative   Lives with her  husband.  Their children live nearby.   Right handed   Caffeine: 1 cup/day   Social Determinants of Health   Financial Resource Strain: Not on file  Food Insecurity: Not on file  Transportation Needs: Not on file  Physical Activity: Not on file  Stress: Not on file  Social Connections: Not on file  Intimate Partner Violence: Not on file    Outpatient Medications Prior to Visit  Medication Sig Dispense Refill  . aspirin EC 81 MG tablet Take 81 mg by mouth daily. Swallow whole.    . Calcium Carbonate+Vitamin D 600-200 MG-UNIT TABS  Take 2 tablets by mouth daily.    . Cholecalciferol (VITAMIN D) 50 MCG (2000 UT) CAPS Take 2,000 Units by mouth daily.    [name] Kitchen estradiol (ESTRACE) 2 MG tablet Take 2 mg by mouth daily.    . hydroxypropyl methylcellulose / hypromellose (ISOPTO TEARS / GONIOVISC) 2.5 % ophthalmic solution Place 1 drop into both eyes in the morning and at bedtime.    . valsartan (DIOVAN) 320 MG tablet Take 1 tablet by mouth once daily 30 tablet 0  . esomeprazole (NEXIUM) 20 MG capsule Take 1 capsule (20 mg total) by mouth 2 (two) times daily before a meal. 60 capsule 3  . amoxicillin (AMOXIL) 500 MG capsule Take 2,000 mg by mouth See admin instructions. Take 4 capsules (2000 mg) by mouth 1 hour prior to dental appointments (Patient not taking: Reported on 06/11/2020)     No facility-administered medications prior to visit.    No Known Allergies  ROS Review of Systems  Constitutional: Negative.   HENT: Positive for trouble swallowing.   Eyes: Negative.   Respiratory: Negative.   Cardiovascular: Negative.   Gastrointestinal: Negative.   Genitourinary: Negative.   Musculoskeletal: Negative.   Skin: Negative.   Neurological: Positive for headaches.  Psychiatric/Behavioral: Negative.       Objective:    Physical Exam Vitals and nursing note reviewed.  Constitutional:      General: She is not in acute distress.    Appearance: Normal appearance. She is normal weight.  She is not ill-appearing, toxic-appearing or diaphoretic.  Neck:     Vascular: No carotid bruit.  Cardiovascular:     Rate and Rhythm: Normal rate and regular rhythm.     Heart sounds: Normal heart sounds. No murmur heard. No friction rub. No gallop.   Pulmonary:     Effort: Pulmonary effort is normal. No respiratory distress.     Breath sounds: Normal breath sounds. No stridor. No wheezing, rhonchi or rales.  Chest:     Chest wall: No tenderness.  Musculoskeletal:     Cervical back: Normal range of motion and neck supple. No tenderness.  Skin:    General: Skin is warm and dry.  Neurological:     General: No focal deficit present.     Mental Status: She is alert and oriented to [place], place, and time. Mental status is at baseline.  Psychiatric:        Mood and Affect: Mood normal.        Behavior: Behavior normal.        Thought Content: Thought content normal.        Judgment: Judgment normal.     BP (!) 144/77   Pulse 63   Temp 97.6 F (36.4 C) (Temporal)   Resp 18   Ht 5\' 6"  (1.676 m)   Wt 202 lb 12.8 oz (92 kg)   SpO2 96%   BMI 32.73 kg/m  Wt Readings from Last 3 Encounters:  06/11/20 202 lb 12.8 oz (92 kg)  04/01/20 207 lb 9.6 oz (94.2 kg)  03/28/20 212 lb (96.2 kg)     Health Maintenance Due  Topic Date Due  . INFLUENZA VACCINE  11/26/2019  . COVID-19 Vaccine (3 - Booster for Moderna series) 01/08/2020    There are no preventive care reminders to display for this patient.  Lab Results  Component Value Date   TSH 1.770 08/09/2019   Lab Results  Component Value Date   WBC 3.6 03/08/2020   HGB 11.6 (L) 03/28/2020  HGB 11.6 (L) 03/28/2020   HCT 34.0 (L) 03/28/2020   HCT 34.0 (L) 03/28/2020   MCV 89 03/08/2020   PLT 259 03/08/2020   Lab Results  Component Value Date   NA 144 03/28/2020   NA 143 03/28/2020   K 3.7 03/28/2020   K 3.7 03/28/2020   CO2 25 03/08/2020   GLUCOSE 86 03/08/2020   BUN 12 03/08/2020   CREATININE 0.88 03/08/2020    BILITOT 0.2 10/04/2019   ALKPHOS 53 10/04/2019   AST 16 10/04/2019   ALT 13 10/04/2019   PROT 6.6 10/04/2019   ALBUMIN 3.7 (L) 10/04/2019   CALCIUM 9.5 03/08/2020   ANIONGAP 14 03/29/2014   GFR 98.74 05/23/2014   Lab Results  Component Value Date   CHOL 180 07/06/2018   Lab Results  Component Value Date   HDL 68 07/06/2018   Lab Results  Component Value Date   LDLCALC 91 07/06/2018   Lab Results  Component Value Date   TRIG 104 07/06/2018   Lab Results  Component Value Date   CHOLHDL 2.6 07/06/2018   Lab Results  Component Value Date   HGBA1C 4.6 08/02/2019      Assessment & Plan:   Problem List Items Addressed This Visit      Cardiovascular and Mediastinum   Essential hypertension (Chronic)   Relevant Orders   CBC With Differential   Hemoglobin A1c   Comprehensive metabolic panel   Lipid panel   TSH     Other   Dyslipidemia (Chronic)   Relevant Orders   CBC With Differential   Hemoglobin A1c   Comprehensive metabolic panel   Lipid panel   TSH    Other Visit Diagnoses    Dysphagia, unspecified type    -  Primary   Relevant Medications   esomeprazole (NEXIUM) 20 MG capsule   sucralfate (CARAFATE) 1 GM/10ML suspension   Other Relevant Orders   CBC With Differential   Hemoglobin A1c   Comprehensive metabolic panel   Lipid panel   TSH   DG SWALLOW STUDY OP MEDICARE SPEECH PATH   Ambulatory referral to Gastroenterology   Globus sensation       Relevant Orders   CBC With Differential   Hemoglobin A1c   Comprehensive metabolic panel   Lipid panel   TSH   Encounter to establish care          Meds ordered this encounter  Medications  . DISCONTD: esomeprazole (NEXIUM) 20 MG capsule    Sig: Take 1 capsule (20 mg total) by mouth 2 (two) times daily before a meal.    Dispense:  60 capsule    Refill:  3    Order Specific Question:   Supervising Provider    Answer:   Carlota Raspberry, JEFFREY R [[id number]]  . DISCONTD: sucralfate (CARAFATE) 1 GM/10ML  suspension    Sig: Take 10 mLs (1 g total) by mouth 4 (four) times daily -  with meals and at bedtime.    Dispense:  420 mL    Refill:  0    Order Specific Question:   Supervising Provider    Answer:   Carlota Raspberry, JEFFREY R [[id number]]  . esomeprazole (NEXIUM) 20 MG capsule    Sig: Take 1 capsule (20 mg total) by mouth 2 (two) times daily before a meal.    Dispense:  180 capsule    Refill:  3    Order Specific Question:   Supervising Provider    Answer:   Carlota Raspberry, JEFFREY  R [[id number]]  . sucralfate (CARAFATE) 1 GM/10ML suspension    Sig: Take 10 mLs (1 g total) by mouth 4 (four) times daily -  with meals and at bedtime.    Dispense:  420 mL    Refill:  0    Order Specific Question:   Supervising Provider    Answer:   Carlota Raspberry, JEFFREY R [[id number]]    Follow-up: No follow-ups on file.   PLAN  Restart esomeprazole, add sucralfate. Suspect GERD largest contributor given her significant hx of this  Will order swallow study to rule out further interruptions.   Labs collected. Will follow up with the patient as warranted.  Suspect headaches on waking likely related to some apnea - reviewed nonpharm and lifestyle management of this  Patient encouraged to call clinic with any questions, comments, or concerns.  Maximiano Coss, NP

## 2020-06-12 LAB — HEMOGLOBIN A1C
Est. average glucose Bld gHb Est-mCnc: 108 mg/dL
Hgb A1c MFr Bld: 5.4 % (ref 4.8–5.6)

## 2020-06-12 LAB — LIPID PANEL
Chol/HDL Ratio: 3.2 ratio (ref 0.0–4.4)
Cholesterol, Total: 213 mg/dL — ABNORMAL HIGH (ref 100–199)
HDL: 66 mg/dL (ref >39)
LDL Chol Calc (NIH): 126 mg/dL — ABNORMAL HIGH (ref 0–99)
Triglycerides: 122 mg/dL (ref 0–149)
VLDL Cholesterol Cal: 21 mg/dL (ref 5–40)

## 2020-06-12 LAB — CBC WITH DIFFERENTIAL
Basophils Absolute: 0 10*3/uL (ref 0.0–0.2)
Basos: 1 %
EOS (ABSOLUTE): 0.1 10*3/uL (ref 0.0–0.4)
Eos: 1 %
Hematocrit: 40.1 % (ref 34.0–46.6)
Hemoglobin: 13.5 g/dL (ref 11.1–15.9)
Immature Grans (Abs): 0 10*3/uL (ref 0.0–0.1)
Immature Granulocytes: 0 %
Lymphocytes Absolute: 1.1 10*3/uL (ref 0.7–3.1)
Lymphs: 24 %
MCH: 30.3 pg (ref 26.6–33.0)
MCHC: 33.7 g/dL (ref 31.5–35.7)
MCV: 90 fL (ref 79–97)
Monocytes Absolute: 0.4 10*3/uL (ref 0.1–0.9)
Monocytes: 9 %
Neutrophils Absolute: 3 10*3/uL (ref 1.4–7.0)
Neutrophils: 65 %
RBC: 4.46 x10E6/uL (ref 3.77–5.28)
RDW: 12.5 % (ref 11.7–15.4)
WBC: 4.5 10*3/uL (ref 3.4–10.8)

## 2020-06-12 LAB — COMPREHENSIVE METABOLIC PANEL
ALT: 13 IU/L (ref 0–32)
AST: 15 IU/L (ref 0–40)
Albumin/Globulin Ratio: 1.3 (ref 1.2–2.2)
Albumin: 3.8 g/dL (ref 3.8–4.8)
Alkaline Phosphatase: 57 IU/L (ref 44–121)
BUN/Creatinine Ratio: 16 (ref 12–28)
BUN: 14 mg/dL (ref 8–27)
Bilirubin Total: 0.2 mg/dL (ref 0.0–1.2)
CO2: 23 mmol/L (ref 20–29)
Calcium: 9.4 mg/dL (ref 8.7–10.3)
Chloride: 105 mmol/L (ref 96–106)
Creatinine, Ser: 0.85 mg/dL (ref 0.57–1.00)
GFR calc Af Amer: 81 mL/min/{1.73_m2} (ref >59)
GFR calc non Af Amer: 70 mL/min/{1.73_m2} (ref >59)
Globulin, Total: 2.9 g/dL (ref 1.5–4.5)
Glucose: 82 mg/dL (ref 65–99)
Potassium: 4.7 mmol/L (ref 3.5–5.2)
Sodium: 141 mmol/L (ref 134–144)
Total Protein: 6.7 g/dL (ref 6.0–8.5)

## 2020-06-12 LAB — TSH: TSH: 2.47 u[IU]/mL (ref 0.450–4.500)

## 2020-06-13 ENCOUNTER — Other Ambulatory Visit: Payer: Self-pay

## 2020-06-13 DIAGNOSIS — I10 Essential (primary) hypertension: Secondary | ICD-10-CM

## 2020-06-13 MED ORDER — VALSARTAN 320 MG PO TABS: 320.0000 mg | ORAL_TABLET | Freq: Every day | ORAL | 0 refills | Status: DC

## 2020-06-13 NOTE — Telephone Encounter (Signed)
90 days sent no refills

## 2020-06-13 NOTE — Telephone Encounter (Signed)
Pt called back to report that she needs a 90 day supply instead of a 30. She is completely out of her current supply, and is almost certain that she discussed this during her appt earlier this week. Please advise   [facility] [id number]  [place] [address]  Phone: [phone] Fax: [phone]   valsartan (DIOVAN) 320 MG tablet

## 2020-06-14 ENCOUNTER — Telehealth: Payer: Self-pay | Admitting: Registered Nurse

## 2020-06-14 NOTE — Telephone Encounter (Signed)
Patient called for clarification about use for prescribed medication  sucralfate (CARAFATE) 1 GM/10ML suspension [[id number]]   Patient stated medication is $100 out of pocket.    Patient asked if medication is for the swallow study. If not, she'd rather not fill that script. She also has other questions about the swallow study  Please advise at [phone] (cell).

## 2020-06-17 NOTE — Telephone Encounter (Signed)
Please advise 

## 2020-06-18 NOTE — Telephone Encounter (Signed)
Patient called again for clarification about use for prescribed medication  sucralfate (CARAFATE) 1 GM/10ML suspension [[id number]]   Patient stated medication is $100 out of pocket.    Patient stated she needs this answer before she schedules the swallow study but wants to schedule it by tomorrow.  Please advise at [phone] (cell).

## 2020-06-18 NOTE — Telephone Encounter (Signed)
Pt asking about carafate as it is $100 out of pocket

## 2020-08-07 ENCOUNTER — Ambulatory Visit (INDEPENDENT_AMBULATORY_CARE_PROVIDER_SITE_OTHER): Payer: PPO | Admitting: Gastroenterology

## 2020-08-07 ENCOUNTER — Other Ambulatory Visit: Payer: Self-pay | Admitting: Gastroenterology

## 2020-08-07 ENCOUNTER — Encounter: Payer: Self-pay | Admitting: Gastroenterology

## 2020-08-07 VITALS — BP 146/72 | HR 84 | Ht 66.0 in | Wt 201.0 lb

## 2020-08-07 DIAGNOSIS — R6889 Other general symptoms and signs: Secondary | ICD-10-CM

## 2020-08-07 DIAGNOSIS — R1319 Other dysphagia: Secondary | ICD-10-CM | POA: Diagnosis not present

## 2020-08-07 DIAGNOSIS — R0989 Other specified symptoms and signs involving the circulatory and respiratory systems: Secondary | ICD-10-CM | POA: Insufficient documentation

## 2020-08-07 DIAGNOSIS — R198 Other specified symptoms and signs involving the digestive system and abdomen: Secondary | ICD-10-CM | POA: Diagnosis not present

## 2020-08-07 MED ORDER — ESOMEPRAZOLE MAGNESIUM 40 MG PO CPDR: 40.0000 mg | DELAYED_RELEASE_CAPSULE | Freq: Two times a day (BID) | ORAL | 1 refills | Status: DC

## 2020-08-07 NOTE — Progress Notes (Addendum)
08/07/2020 Lisa Miles [id number] [birth year]   HISTORY OF PRESENT ILLNESS: This is a 70 year old female who is new to our office.  She has previous GI history with Dr. Mann/Dr. Benson Norway.  She is here today with complaints of constantly clearing her throat and feeling like there is constantly something in her throat/globus sensation.  She says that when she drinks water it feels like it kind of gurgles up in her throat.  She denies any dysphagia to solid foods.  She tells me that she has had both EGD and colonoscopy with [facility] in the past.  She thinks her EGD was probably about 2 years ago.  She tells me that she has been dealing with these symptoms above for over 2 years.  It appears that she is also been seen by ENT at least back in 2016 she saw Dr. Constance Holster for "cough".  She also reports feeling like her nose is chronic constantly stuffed up.  She is on Nexium 20 mg twice daily.  She denies any overt heartburn or reflux type symptoms.  Referred here by Maximiano Coss, NP.   Past Medical History:  Diagnosis Date  . Aortic stenosis, mild-moderate 07/2012   TTE October 21: Moderate aortic valve thickening with mild to moderate AI & mild AS; TEE: mild to moderate AI & MIld AS  . Arthritis    Phreesia 11/06/2019  . Asthma   . Bilateral bunions    Bunionectomies performed  . Bronchitis, chronic ([place])   . Cataract    Phreesia 11/06/2019  . Heart disease   . Hyperlipidemia   . Hypertension    Good control  . Inguinodynia    Bilateral groin pain  . Lower extremity edema    Chronic. Venous Duplex 11/06/11 SUMMARY: 1) Bilateral Lower Extremities: No evidence of DVT or thrombophlebitis.  2) Right Common Femoral Vein: Demonstrated mild valvular insufficiency with a greater than (1) sec of duration. Mildly abnormal LE Venous duplex Doppler.  . Obesity   . Palpitations    Relatively well controlled  . Scoliosis    DG Chest 2 View x-ray on 07/30/11 by Dr. Everlene Farrier shows a scoliosis.  . Severe  mitral regurgitation by prior echocardiogram 07/2012   Mild AMVL prolapse, Mod MR -- no MVP noted in 01/2018 -> 11/'21: Severe MR due to restricted movement of P3 scallop of PMVL (IIIB) due to incomplete leaflet coaptation -> thickened leaflets consistent with Rheumatic Heart Valve Disease.   Past Surgical History:  Procedure Laterality Date  . ABDOMINAL HYSTERECTOMY    . ANKLE ARTHROSCOPY Left   . BUNIONECTOMY Bilateral   . EYE SURGERY N/A    Phreesia 11/06/2019  . KNEE ARTHROSCOPY Right   . Left knee open surgery     pt thinks she only had shots in L knee, not surgery  . RIGHT/LEFT HEART CATH AND CORONARY ANGIOGRAPHY N/A 03/28/2020   Procedure: RIGHT/LEFT HEART CATH AND CORONARY ANGIOGRAPHY;  Surgeon: Leonie Man, MD;  Location: [facility];  Service: Cardiovascular;  Laterality: N/A;  . TEE WITHOUT CARDIOVERSION N/A 03/13/2020   Procedure: TRANSESOPHAGEAL ECHOCARDIOGRAM (TEE);  Surgeon: Geralynn Rile, MD;  [facility] ENDOSCOPY;;; Severe MR 2/2 restricted P3 scallop of PMVL (IIIB) w/ incomplete leaflet coaptation.  Thickened /degenerative leaflets c/w Rheumatic Valvular Heart Disease.  2D PISA radius 0.9 cm with 2D ERO 0.26 cm2 with R Vol 62 cc. PV blunting w/ BP 109/60 mmHg. No MS. Mild-mod AI / Mild AS. Gr 2 plaque Aorta  .  TOTAL ABDOMINAL HYSTERECTOMY    . TRANSTHORACIC ECHOCARDIOGRAM  07/2012; 01/2013   a) 4/'14: mild-mod AS, Mod AI (mean/peak gradient: 18 mmHg / 31 mmhg).  Mild MVP w/ Mod MR, no MS.  Mildly elevated PAP (~37 mmHg) w/ Mod TR. EF 55-60%. GR 1 DD.;; b). 01/2013: EF 60-65%. No RWMA. Gr  DD. Mild AS (M / P Gradient 20 mmHg/34 mmHg). Mild AMVL Prolapse w/ Mod MR. Mod LA dilation. Mod TR.  [name] Kitchen TRANSTHORACIC ECHOCARDIOGRAM  01/2015; 01/2016   a) Normal LV size and function. GR 1 DD. Moderate LA Dilation. Calcified aortic valve with mild AS, moderate (2+) AI. Restricted posterior MV leaflet movement w/ Mod MR. Mode TR with mod elevated PA pressures (45 mmHg);; b)  Relatively stable. Moderate concentric LVH. Moderate AI with mild AS (Mean-PeaK Gradient: 19 / 36 mmHg). Mild MR. Mod LA dilation. PAP ~40 mmHg -> plan follow-up 2020.  [name] Kitchen TRANSTHORACIC ECHOCARDIOGRAM  01/2018   Normal LV size.  Moderate concentric LVH.  EF 60 to 65%.  No R WMA.  GR 1 DD (high filling pressures).  Mild AS (mean gradient 16 mmHg) with moderate AI.  Severe LA dilation.  Mild MR.  . TRANSTHORACIC ECHOCARDIOGRAM  01/31/2020    EF 65 to 70% with normal wall motion..  Severe MR with restricted PMVL movement.  (Recommend TEE).  Moderate aortic valve thickening with mild to moderate stenosis, and moderate aortic regurgitation.  Normal RV function.  Mildly elevated pressures.  Severe LA dilation.     reports that she has never smoked. She has never used smokeless tobacco. She reports that she does not drink alcohol and does not use drugs. family history includes Arthritis in her mother; Cancer in her father; Diabetes in her brother, maternal grandfather, and mother; Gout in her mother; Hyperlipidemia in her mother; Hypertension in her brother, brother, and mother. No Known Allergies    Outpatient Encounter Medications as of 08/07/2020  Medication Sig  . aspirin EC 81 MG tablet Take 81 mg by mouth daily. Swallow whole.  . Calcium Carbonate+Vitamin D 600-200 MG-UNIT TABS Take 2 tablets by mouth daily.  . Cholecalciferol (VITAMIN D) 50 MCG (2000 UT) CAPS Take 2,000 Units by mouth daily.  [name] Kitchen esomeprazole (NEXIUM) 20 MG capsule Take 1 capsule (20 mg total) by mouth 2 (two) times daily before a meal.  . estradiol (ESTRACE) 2 MG tablet Take 2 mg by mouth daily.  . hydroxypropyl methylcellulose / hypromellose (ISOPTO TEARS / GONIOVISC) 2.5 % ophthalmic solution Place 1 drop into both eyes in the morning and at bedtime.  . valsartan (DIOVAN) 320 MG tablet Take 1 tablet (320 mg total) by mouth daily.  . [DISCONTINUED] sucralfate (CARAFATE) 1 GM/10ML suspension Take 10 mLs (1 g total) by mouth 4 (four)  times daily -  with meals and at bedtime.   No facility-administered encounter medications on file as of 08/07/2020.    REVIEW OF SYSTEMS  : All other systems reviewed and negative except where noted in the History of Present Illness.   PHYSICAL EXAM: BP (!) 146/72   Pulse 84   Ht 5\' 6"  (1.676 m)   Wt 201 lb (91.2 kg)   BMI 32.44 kg/m  General: Well developed AA female in no acute distress Head: Normocephalic and atraumatic Eyes:  Sclerae anicteric, conjunctiva pink. Ears: Normal auditory acuity Lungs: Clear throughout to auscultation; no W/R/R. Heart: Regular rate and rhythm; 3/6 SEM noted. Abdomen: Soft, non-distended.  BS present.  Non-tender. Musculoskeletal: Symmetrical with no gross  deformities  Skin: No lesions on visible extremities Extremities: No edema  Neurological: Alert oriented x 4, grossly non-focal Psychological:  Alert and cooperative. Normal mood and affect  ASSESSMENT AND PLAN: *70 year old female with complaints of frequent throat clearing, globus sensation, and issues with swallowing water/liquids more up in her throat.  Symptoms have been present for well over 2 years.  She reports having an EGD about 2 years ago by Dr. Benson Norway.  Looks like she has seen ENT in the past in 2016 for complaints of ongoing cough.  We will request previous colonoscopy and endoscopy records from Dr. Adriana Mccallum.  Sounds like EGD was probably unremarkable.  Will plan for an esophagram to clear the esophagus of any major issues.  She is on Nexium 20 mg twice daily I am going to increase that to 40 mg twice daily for the next several weeks to see if that helps.  She denies any overt heartburn or reflux symptoms, however.  If esophagram is unremarkable then may consider modified barium swallow study and possibly may need to be seen by ENT again.  New PPI prescription sent to pharmacy.  **Addendum: Last clonoscopy in October 2018 by Dr. Collene Mares showed few scattered diverticulosis in the entire  examined colon, one 6 mm sessile polyp in the sigmoid colon that was removed, small internal hemorrhoids.  Pathology showed a TA.  It looks like she is receiving colonoscopies every 5 years from 2003.  The only EGD the report that I have is from January 2003 at which time she was found to have a normal-appearing esophagus and proximal small bowel.  She had a few sessile polyps in the proximal half of the stomach.  The pathology showed benign fundic gland polyps and a fragment of benign gastric body type mucosa with no significant atrophy or intestinal metaplasia or dysplasia.  Negative for H. pylori.   CC:  Maximiano Coss, NP

## 2020-08-07 NOTE — Patient Instructions (Addendum)
If you are age 70 or older, your body mass index should be between 23-30. Your Body mass index is 32.44 kg/m. If this is out of the aforementioned range listed, please consider follow up with your Primary Care Provider.  If you are age 48 or younger, your body mass index should be between 19-25. Your Body mass index is 32.44 kg/m. If this is out of the aformentioned range listed, please consider follow up with your Primary Care Provider.   We will get your records for review.  You have been scheduled for a Barium Esophogram at [facility] (1st floor of the hospital) on 08-15-2020 at 1030am. Please arrive 15 minutes prior to your appointment for registration. Make certain not to have anything to eat or drink 3 hours prior to your test. If you need to reschedule for any reason, please contact radiology at [phone] to do so. __________________________________________________________________ A barium swallow is an examination that concentrates on views of the esophagus. This tends to be a double contrast exam (barium and two liquids which, when combined, create a gas to distend the wall of the oesophagus) or single contrast (non-ionic iodine based). The study is usually tailored to your symptoms so a good history is essential. Attention is paid during the study to the form, structure and configuration of the esophagus, looking for functional disorders (such as aspiration, dysphagia, achalasia, motility and reflux) EXAMINATION You may be asked to change into a gown, depending on the type of swallow being performed. A radiologist and radiographer will perform the procedure. The radiologist will advise you of the type of contrast selected for your procedure and direct you during the exam. You will be asked to stand, sit or lie in several different positions and to hold a small amount of fluid in your mouth before being asked to swallow while the imaging is performed .In some instances you may be  asked to swallow barium coated marshmallows to assess the motility of a solid food bolus. The exam can be recorded as a digital or video fluoroscopy procedure. POST PROCEDURE It will take 1-2 days for the barium to pass through your system. To facilitate this, it is important, unless otherwise directed, to increase your fluids for the next 24-48hrs and to resume your normal diet.  This test typically takes about 30 minutes to perform. __________________________________________________________________________________

## 2020-08-07 NOTE — Progress Notes (Signed)
Assessment and plan noted ?

## 2020-08-15 ENCOUNTER — Other Ambulatory Visit: Payer: Self-pay

## 2020-08-15 ENCOUNTER — Ambulatory Visit (HOSPITAL_COMMUNITY)
Admission: RE | Admit: 2020-08-15 | Discharge: 2020-08-15 | Disposition: A | Discharge status: Home / Self Care | Payer: PPO | Source: Ambulatory Visit | Attending: Gastroenterology | Admitting: Gastroenterology

## 2020-08-15 DIAGNOSIS — R6889 Other general symptoms and signs: Secondary | ICD-10-CM | POA: Diagnosis not present

## 2020-08-15 DIAGNOSIS — R198 Other specified symptoms and signs involving the digestive system and abdomen: Secondary | ICD-10-CM | POA: Diagnosis not present

## 2020-08-15 DIAGNOSIS — R0989 Other specified symptoms and signs involving the circulatory and respiratory systems: Secondary | ICD-10-CM

## 2020-08-15 DIAGNOSIS — T17320A Food in larynx causing asphyxiation, initial encounter: Secondary | ICD-10-CM | POA: Diagnosis not present

## 2020-08-15 DIAGNOSIS — R1319 Other dysphagia: Secondary | ICD-10-CM | POA: Insufficient documentation

## 2020-08-28 DIAGNOSIS — Z124 Encounter for screening for malignant neoplasm of cervix: Secondary | ICD-10-CM | POA: Diagnosis not present

## 2020-08-28 DIAGNOSIS — Z6834 Body mass index (BMI) 34.0-34.9, adult: Secondary | ICD-10-CM | POA: Diagnosis not present

## 2020-09-01 ENCOUNTER — Other Ambulatory Visit: Payer: Self-pay | Admitting: Registered Nurse

## 2020-09-01 DIAGNOSIS — I10 Essential (primary) hypertension: Secondary | ICD-10-CM

## 2020-09-27 DIAGNOSIS — Z1231 Encounter for screening mammogram for malignant neoplasm of breast: Secondary | ICD-10-CM | POA: Diagnosis not present

## 2020-10-25 ENCOUNTER — Other Ambulatory Visit: Payer: Self-pay

## 2020-10-25 ENCOUNTER — Ambulatory Visit (INDEPENDENT_AMBULATORY_CARE_PROVIDER_SITE_OTHER): Payer: PPO | Admitting: Cardiology

## 2020-10-25 ENCOUNTER — Encounter: Payer: Self-pay | Admitting: Cardiology

## 2020-10-25 VITALS — BP 138/88 | HR 60 | Resp 18 | Ht 66.0 in | Wt 203.0 lb

## 2020-10-25 DIAGNOSIS — E785 Hyperlipidemia, unspecified: Secondary | ICD-10-CM

## 2020-10-25 DIAGNOSIS — I10 Essential (primary) hypertension: Secondary | ICD-10-CM | POA: Diagnosis not present

## 2020-10-25 DIAGNOSIS — R600 Localized edema: Secondary | ICD-10-CM | POA: Diagnosis not present

## 2020-10-25 DIAGNOSIS — I340 Nonrheumatic mitral (valve) insufficiency: Secondary | ICD-10-CM

## 2020-10-25 DIAGNOSIS — I061 Rheumatic aortic insufficiency: Secondary | ICD-10-CM | POA: Diagnosis not present

## 2020-10-25 DIAGNOSIS — R002 Palpitations: Secondary | ICD-10-CM

## 2020-10-25 MED ORDER — CHLORTHALIDONE 25 MG PO TABS: 25.0000 mg | ORAL_TABLET | Freq: Every day | ORAL | 11 refills | Status: DC

## 2020-10-25 NOTE — Progress Notes (Signed)
Primary Care Provider: Pcp, No Cardiologist: Glenetta Hew, MD Electrophysiologist: None CVTS: Dr. Roxy Manns (will need new Surgeon)  Clinic Note: Chief Complaint  Patient presents with   Follow-up    7 month-was supposed to have been seen in December 2021 for 1 month follow-up   Mitral Regurgitation    Has had extensive work-up including TEE, cardiac cath, exercise stress echo/CPX and event monitor since last visit.     ===================================  ASSESSMENT/PLAN   Problem List Items Addressed This Visit     Moderate to severe mitral regurgitation - Primary (Chronic)    Somewhat conflicting data as far as her mitral valve disease.  TEE clearly showed severe MR, but she had a relatively asymptomatic exercise stress echo.  Unfortunately, the reader did not give much information about any change in the mild regurgitation.  Post exercise, he simply mentioned moderate appearing rheumatic mitral regurgitation..  At this point, as long as she continues to be stable symptomatically, the recommendation from Dr. Roxy Manns was to simply treat medically with blood pressure control and monitor for symptoms.  Plan: Add additional blood pressure control with chlorthalidone. I spent about 15 to 20 minutes discussing the difference tests and then the pathophysiology of a mitral regurgitation as well as symptoms that she should be looking for. Also told her about concerns for possible atrial fibrillation and how that would likely exacerbate symptoms. Repeat 2D echo in January 2023 Guidelines are somewhat scant, but at this point probably does not require SBE prophylaxis.       Relevant Medications   chlorthalidone (HYGROTON) 25 MG tablet   Other Relevant Orders   ECHOCARDIOGRAM COMPLETE   Basic metabolic panel (Completed)   Aortic insufficiency (Chronic)    Multiple studies have not suggested either mild AS/AI or mild to moderate.  Murmurs are heard on exam, but do not seem to be on the  valve.  I suspect that if she were to undergo mitral valve surgery, she probably would also benefit from aortic valve surgery if it gets worse, but at present, the mitral valve seems the worst.  Continue to follow echoes.-Next echo scheduled for January 2023       Relevant Medications   chlorthalidone (HYGROTON) 25 MG tablet   Other Relevant Orders   ECHOCARDIOGRAM COMPLETE   Basic metabolic panel (Completed)   Dyslipidemia, goal LDL below 100 (Chronic)    This is being followed by PCP ostensibly.  LDL was 126.  With her risk factors, I think it is probably reasonable to shoot for an LDL < 100 and TC<200.  Low threshold to consider initiation of statin therapy.       Relevant Medications   chlorthalidone (HYGROTON) 25 MG tablet   Essential hypertension (Chronic)    BP still elevated.  We will plan starting HCTZ which never got started.    She is on high-dose valsartan for afterload reduction.  Heart rate is 60 at baseline so therefore beta-blocker would not be indicated.  Plan: Add chlorthalidone 25 mg daily for better full day coverage -Recheck BMP 1 week after starting         Relevant Medications   chlorthalidone (HYGROTON) 25 MG tablet   Lower extremity edema (Chronic)    Trivial edema although bothersome to her.  Not associated with PND or orthopnea and therefore not likely heart failure related.  Plan: Start chlorthalidone 25 mg daily and recommend foot elevation/support stockings.       Heart palpitations (Chronic)    Does  not really seem to be complaining about palpitations at this time.  Event monitor did not show any significant besides one short bursts of PAT and even shorter bursts of NSVT.  We discussed symptoms that would be concerning for A. fib such as fast irregular heartbeats.  If she has any significant exacerbation of shortness of breath symptoms that could also indicate A. Fib.  Continue to monitor.  No beta-blocker because of bradycardia.        Relevant Orders   ECHOCARDIOGRAM COMPLETE   Basic metabolic panel (Completed)    ===================================  HPI:    Lisa Miles is a 70 y.o. female with a PMH below who presents today for Delayed 44-month follow-up (was not seen post cath).  HYPERTENSIVE HEART DISEASE,  SEVERE (Rheumatic) MITRAL REGURGITATION & MILD-MOD AI/AS,   Lisa Miles was last seen on March 15, 2020 following her TEE.  Scheduled appointment for catheterization and surgery consult.  Recent Hospitalizations: None with exception of TEE and cath.  Was seen by Dr. Roxy Manns on May 20, 2020  following Echo Stress Test and Cardiopulmonary Stress Testing (CPX).  Was still feeling well.  Denied any significant Exertional dyspnea or chest discomfort.  Intermittent right lower extremity edema. -> Given the lack of symptoms and relatively normal CPX/stress echo and monitor,, recommendation was observation with follow-up annual echo.   Reviewed  CV studies:    The following studies were reviewed today: (if available, images/films reviewed: From Epic Chart or Care Everywhere) TEE 03/13/2020: Severe MR 2/2 restricted movement of  P3 MVL in systole (3B) w/ incomplete leaflet coaptation.  Thickened/degenerative leaflets -> rheumatic.  Systolic blunting of all PV w/ BP of 109/60 mmHg. (2D PISA radiums 0.9 cm with 2D ERO 0.26 cm2 with R vol 54 cm. 3D ERO 0.30 cm2 with R vol 62 cc.). Rheumatic AoV thickening / calcification. Mild-Mod central AI & AS (MG 16 mmHg).  LVEF 60-65%. Normal RV & CVP.[name] Kitchen Mod LA dilation. Gr II Aortic plaque.  R&LHC 03/28/2020: Angiographically normal coronary arteries with cloacal Left main..  Normal RHC pressures without pulmonary hypertension.  Prominent PCWP V wave suggesting MR. Right Heart Cath Pressures: PAP 40/13 mmHg with a mean of 22 mmHg.  PCWP mean 14 mmHg with a V wave of 25 mmHg (suggesting mild regurgitation); LVEDP 15 mmHg.;  Normal Cardiac Output of 5.02 with mildly reduced  Cardiac Index  2.45 Tortuous Innominate Artery and Brachiocephalic Vein..  Event monitor (04/05/2020 -05/04/2020): Mostly NSR.  Heart rate range 42-148 bpm.  3 short bursts of 4-5 beats NSVT (asymptomatic).  116 beat run of PAT.  Rare PACs and PVCs.  Some bigeminy and trigeminy. CPX:  Exercise testing with gas exchange demonstrates normal functional capacity when compared to matched sedentary norms. There is no indication for cardiopulmonary limitation. Patient's body habitus is contributing to exercise intolerance Echo stress test 05/09/2020:: TTE shows normal EF 65% with LVH No pericardial effusion Normal RV size and function Moderate appearing rheumatic MR/AR.   1. Limited images performed during CPX study ECG and CPX portion of study separately interpreted. TTE shows normal EF 65% with LVH No pericardial effusion Normal RV size and function Moderate appearing rheumatic MR/AR.   2. This is a low risk study.  Additional Findings: Limited images performed during CPX study ECG and CPX portion of study separately interpreted.  Interval History:   Lisa Miles returns here todayStill doing relatively well.  She is somewhat sedentary as far as doing any routine activity, but  she says she walks a lot in the house.  Other than some mild end of day edema in her feet, she denies any PND or orthopnea.  She denies any chest pain or pressure with rest or exertion. She really denies significant exertional dyspnea.  No sensation of rapid irregular heartbeats palpitations.    Notably, she is a poor historian and she herself has a hard time remembering the multiple conversations we have had about MVP/MR.  Twice she asked me to explain it again to her.  I spent another 10 to 15 minutes explaining the pathophysiology.  We reviewed the fact that this was even discussed with Dr. Roxy Manns after multiple test.  We reviewed the roles of the tests that indicated moderate to severe mitral gravitation but with normal LV  function and no significant worsening with exercise.  No arrhythmias noted on monitor.  CV Review of Symptoms (Summary) Cardiovascular ROS: positive for - edema and Mild exertional dyspnea mostly deconditioned. negative for - chest pain, edema, irregular heartbeat, orthopnea, palpitations, paroxysmal nocturnal dyspnea, rapid heart rate, shortness of breath, or Lightheadedness when dizziness or wooziness, syncope/near syncope or TIA/amaurosis fugax, claudication.  REVIEWED OF SYSTEMS   Review of Systems  Constitutional:  Positive for malaise/fatigue (Never really has much energy.). Negative for weight loss.  HENT:  Negative for congestion and nosebleeds.   Respiratory:  Negative for cough and shortness of breath.   Gastrointestinal:  Negative for blood in stool and melena.  Genitourinary:  Negative for hematuria.  Musculoskeletal:  Positive for joint pain. Negative for falls and myalgias.  Neurological:  Negative for dizziness, focal weakness and weakness.  Psychiatric/Behavioral:  Positive for memory loss. Negative for depression. The patient is not nervous/anxious and does not have insomnia.    I have reviewed and (if needed) personally updated the patient's problem list, medications, allergies, past medical and surgical history, social and family history.   PAST MEDICAL HISTORY   Past Medical History:  Diagnosis Date   Aortic stenosis, mild-moderate 07/2012   TTE October 21: Moderate aortic valve thickening with mild to moderate AI & mild AS; TEE: mild to moderate AI & MIld AS   Arthritis    Phreesia 11/06/2019   Asthma    Bilateral bunions    Bunionectomies performed   Bronchitis, chronic ([place])    Cataract    Phreesia 11/06/2019   Heart disease    Hyperlipidemia    Hypertension    Good control   Inguinodynia    Bilateral groin pain   Lower extremity edema    Chronic. Venous Duplex 11/06/11 SUMMARY: 1) Bilateral Lower Extremities: No evidence of DVT or thrombophlebitis.  2)  Right Common Femoral Vein: Demonstrated mild valvular insufficiency with a greater than (1) sec of duration. Mildly abnormal LE Venous duplex Doppler.   Obesity    Palpitations    Relatively well controlled   Scoliosis    DG Chest 2 View x-ray on 07/30/11 by Dr. Everlene Farrier shows a scoliosis.   Severe mitral regurgitation by prior echocardiogram 07/2012   Mild AMVL prolapse, Mod MR -- no MVP noted in 01/2018 -> 11/'21: Severe MR due to restricted movement of P3 scallop of PMVL (IIIB) due to incomplete leaflet coaptation -> thickened leaflets consistent with Rheumatic Heart Valve Disease.    PAST SURGICAL HISTORY   Past Surgical History:  Procedure Laterality Date   ABDOMINAL HYSTERECTOMY     ANKLE ARTHROSCOPY Left    BUNIONECTOMY Bilateral    CARDIAC EVENT MONITOR  03/2020   (  04/05/2020 -05/04/2020): Mostly NSR.  Heart rate range 42-148 bpm.  3 short bursts of 4-5 beats NSVT (asymptomatic).  116 beat run of PAT.  Rare PACs and PVCs.  Some bigeminy and trigeminy.   CARDIOPULMONARY EXERCISE TEST (CPX)  05/09/2020   (done with Ex St Echo): Normal Functional Capacity. No indication for CP limitations.  Body Habitus contributes to Exercise Intolerance.   EXERCISE STRESS ECHO  05/09/2020   TTE shows normal EF 65% with LVH No pericardial effusion Normal RV size and function Moderate appearing rheumatic MR/AR.;; -- NO COMMENT ON EXERCISE EFFECT ON MR!!! (even though this was the reason for ordering the test)   EYE SURGERY N/A    Phreesia 11/06/2019   KNEE ARTHROSCOPY Right    Left knee open surgery     pt thinks she only had shots in L knee, not surgery   RIGHT/LEFT HEART CATH AND CORONARY ANGIOGRAPHY N/A 03/28/2020   RIGHT/LEFT HEART CATH AND CORONARY ANGIOGRAPHY;  Leonie Man, MD;  Location: [facility] INVASIVE CV LAB; angiographically normal coronary arteries.  Cloacal LM. Normal RHC Pressures (PAP 40/13 - mean 22 mmHg, PCWP 16mmHg, V wave 25 mmHg c/w MR, LVEDP 15 mmHg); CO/CI 5.02 / 2.45 (reduced).   TEE  WITHOUT CARDIOVERSION N/A 03/13/2020   Procedure: TRANSESOPHAGEAL ECHOCARDIOGRAM (TEE);  Surgeon: Geralynn Rile, MD;  [facility] ENDOSCOPY;;; Severe MR 2/2 restricted P3 scallop of PMVL (IIIB) w/ incomplete leaflet coaptation.  Thickened /degenerative leaflets c/w Rheumatic Valvular Heart Disease.  2D PISA radius 0.9 cm with 2D ERO 0.26 cm2 with R Vol 62 cc. PV blunting w/ BP 109/60 mmHg. No MS. Mild-mod AI / Mild AS. Gr 2 plaque Aorta   TOTAL ABDOMINAL HYSTERECTOMY     TRANSTHORACIC ECHOCARDIOGRAM  01/2015; 01/2016   a) Normal LV size and function. GR 1 DD. Moderate LA Dilation. Calcified aortic valve with mild AS, moderate (2+) AI. Restricted posterior MV leaflet movement w/ Mod MR. Mode TR with mod elevated PA pressures (45 mmHg);; b) Relatively stable. Moderate concentric LVH. Moderate AI with mild AS (Mean-PeaK Gradient: 19 / 36 mmHg). Mild MR. Mod LA dilation. PAP ~40 mmHg -> plan follow-up 2020.   TRANSTHORACIC ECHOCARDIOGRAM  01/2018   a) Normal LV size.  Mod Conc LVH.  EF 60-65%.  No R WMA.  GR 1 DD (high P).  Mild AS (MG 16 mmHg) w/ Mod AI.  Severe LA dilation.  Mild MR.; 01/2020:  EF 65-70%. No RWMA.  Severe MR w/ restricted PMVL movement.  (Recommend TEE).  Mod AoV thickening w/ mild to mod AS & Mod AI.  Nl RV fxn.  Mildly elevated filling P. Severe LA dilation.    Immunization History  Administered Date(s) Administered   Fluad Quad(high Dose 65+) 01/06/2019   Influenza Whole 03/27/2009   Influenza, High Dose Seasonal PF 04/01/2018   Influenza,inj,Quad PF,6+ Mos 03/15/2012, 03/28/2013, 07/22/2016   Moderna Sars-Covid-2 Vaccination 06/10/2019, 07/08/2019   Pneumococcal Conjugate-13 07/22/2016   Pneumococcal Polysaccharide-23 04/02/2009, 09/22/2018   Tdap 03/28/2013    MEDICATIONS/ALLERGIES   Current Meds  Medication Sig   aspirin EC 81 MG tablet Take 81 mg by mouth daily. Swallow whole.   Calcium Carbonate+Vitamin D 600-200 MG-UNIT TABS Take 2 tablets by mouth daily.    chlorthalidone (HYGROTON) 25 MG tablet Take 1 tablet (25 mg total) by mouth daily.   Cholecalciferol (VITAMIN D) 50 MCG (2000 UT) CAPS Take 2,000 Units by mouth daily.   esomeprazole (NEXIUM) 40 MG capsule TAKE 1 CAPSULE(40 MG) BY MOUTH  TWICE DAILY BEFORE A MEAL   estradiol (ESTRACE) 2 MG tablet Take 2 mg by mouth daily.   hydroxypropyl methylcellulose / hypromellose (ISOPTO TEARS / GONIOVISC) 2.5 % ophthalmic solution Place 1 drop into both eyes in the morning and at bedtime.   valsartan (DIOVAN) 320 MG tablet TAKE 1 TABLET(320 MG) BY MOUTH DAILY   [DISCONTINUED] esomeprazole (NEXIUM) 20 MG capsule Take 1 capsule (20 mg total) by mouth 2 (two) times daily before a meal.    No Known Allergies  SOCIAL HISTORY/FAMILY HISTORY   Reviewed in Epic:  Pertinent findings:  Social History   Tobacco Use   Smoking status: Never   Smokeless tobacco: Never  Vaping Use   Vaping Use: Never used  Substance Use Topics   Alcohol use: No   Drug use: No   Social History   Social History Narrative   Lives with her husband.  Their children live nearby.   Right handed   Caffeine: 1 cup/day    OBJCTIVE -PE, EKG, labs   Wt Readings from Last 3 Encounters:  10/25/20 203 lb (92.1 kg)  08/07/20 201 lb (91.2 kg)  06/11/20 202 lb 12.8 oz (92 kg)    Physical Exam: BP 138/88 (BP Location: Left Arm, Patient Position: Sitting, Cuff Size: Normal)   Pulse 60   Resp 18   Ht 5\' 6"  (1.676 m)   Wt 203 lb (92.1 kg)   SpO2 98%   BMI 32.77 kg/m  Physical Exam Constitutional:      General: She is not in acute distress.    Appearance: Normal appearance. She is obese. She is not ill-appearing or toxic-appearing.  HENT:     Head: Normocephalic and atraumatic.  Neck:     Vascular: No carotid bruit (Radiated AS murmur.), hepatojugular reflux or JVD.  Cardiovascular:     Rate and Rhythm: Normal rate and regular rhythm. Occasional Extrasystoles are present.    Chest Wall: PMI is not displaced.     Pulses:  Normal pulses.     Heart sounds: S1 normal and S2 normal. Heart sounds are distant. Murmur heard.  Harsh crescendo-decrescendo midsystolic murmur is present with a grade of 2/6 at the upper right sternal border radiating to the neck.  Medium-pitched blowing crescendo-decrescendo midsystolic murmur of grade 2/6 is also present at the apex radiating to the back.  High-pitched blowing decrescendo early diastolic murmur is present with a grade of 1/4 at the upper right sternal border radiating to the apex.    No friction rub. No gallop.  Pulmonary:     Effort: Pulmonary effort is normal. No respiratory distress.     Breath sounds: Normal breath sounds. No wheezing, rhonchi or rales.  Musculoskeletal:        General: Swelling (Trivial bilateral ankle) present. Normal range of motion.     Cervical back: Normal range of motion and neck supple.  Skin:    General: Skin is warm and dry.  Neurological:     General: No focal deficit present.     Mental Status: She is alert and oriented to [place], place, and time.  Psychiatric:        Mood and Affect: Mood normal.        Behavior: Behavior normal.        Judgment: Judgment normal.     Comments: She seems somewhat confused today.  Not a very good historian.  Did not remember the conversations we have had about her mitral valve.  Amazingly, she has not  gathered all of the data provided since last October.    Adult ECG Report N/a   Recent Labs:  Reviewed  Lab Results  Component Value Date   CHOL 213 (H) 06/11/2020   HDL 66 06/11/2020   LDLCALC 126 (H) 06/11/2020   TRIG 122 06/11/2020   CHOLHDL 3.2 06/11/2020   Lab Results  Component Value Date   CREATININE 0.93 11/01/2020   BUN 13 11/01/2020   NA 138 11/01/2020   K 4.1 11/01/2020   CL 99 11/01/2020   CO2 27 11/01/2020   CBC Latest Ref Rng & Units 06/11/2020 03/28/2020 03/28/2020  WBC 3.4 - 10.8 x10E3/uL 4.5 - -  Hemoglobin 11.1 - 15.9 g/dL 13.5 11.6(L) 11.6(L)  Hematocrit 34.0 - 46.6 %  40.1 34.0(L) 34.0(L)  Platelets 150 - 450 x10E3/uL - - -    Lab Results  Component Value Date   TSH 2.470 06/11/2020    ==================================================  COVID-19 Education: The signs and symptoms of COVID-19 were discussed with the patient and how to seek care for testing (follow up with PCP or arrange E-visit).    I spent a total of 29 minutes with the patient spent in direct patient consultation.  Additional time spent with chart review  / charting (studies, outside notes, etc): 25 min Total Time: 54 min  Current medicines are reviewed at length with the patient today.  (+/- concerns) n/a  This visit occurred during the SARS-CoV-2 public health emergency.  Safety protocols were in place, including screening questions prior to the visit, additional usage of staff PPE, and extensive cleaning of exam room while observing appropriate contact time as indicated for disinfecting solutions.  Notice: This dictation was prepared with Dragon dictation along with smaller phrase technology. Any transcriptional errors that result from this process are unintentional and may not be corrected upon review.  Patient Instructions / Medication Changes & Studies & Tests Ordered   Patient Instructions  Medication Instructions:   Start taking Chlorthalidone 25 mg one tablet daily    *If you need a refill on your cardiac medications before your next appointment, please call your pharmacy*   Lab Work: BMP in 7 days   Testing/Procedures: Wil be schedule at [address] [employer]  [address] - Jan 2023 Your physician has requested that you have an echocardiogram. Echocardiography is a painless test that uses sound waves to create images of your heart. It provides your doctor with information about the size and shape of your heart and how well your heart's chambers and valves are working. This procedure takes approximately one hour. There are no restrictions for this  procedure.    Follow-Up: At [facility], you and your health needs are our priority.  As part of our continuing mission to provide you with exceptional heart care, we have created designated Provider Care Teams.  These Care Teams include your primary Cardiologist (physician) and Advanced Practice Providers (APPs -  Physician Assistants and Nurse Practitioners) who all work together to provide you with the care you need, when you need it.     Your next appointment:   7 month(s)  The format for your next appointment:   In [place]  Provider:    Glenetta Hew, MD   Other Instructions    Studies Ordered:   Orders Placed This Encounter  Procedures   Basic metabolic panel   ECHOCARDIOGRAM COMPLETE     Glenetta Hew, M.D., M.S. Interventional Cardiologist   Pager # [phone] Phone # [phone] [address] [place], [place] [address]   Thank you for choosing Heartcare at [facility]!!

## 2020-10-25 NOTE — Patient Instructions (Addendum)
Medication Instructions:   Start taking Chlorthalidone 25 mg one tablet daily    *If you need a refill on your cardiac medications before your next appointment, please call your pharmacy*   Lab Work: BMP in 7 days   Testing/Procedures: Wil be schedule at [address] - Jan 2023 Your physician has requested that you have an echocardiogram. Echocardiography is a painless test that uses sound waves to create images of your heart. It provides your doctor with information about the size and shape of your heart and how well your heart's chambers and valves are working. This procedure takes approximately one hour. There are no restrictions for this procedure.    Follow-Up: At [facility], you and your health needs are our priority.  As part of our continuing mission to provide you with exceptional heart care, we have created designated Provider Care Teams.  These Care Teams include your primary Cardiologist (physician) and Advanced Practice Providers (APPs -  Physician Assistants and Nurse Practitioners) who all work together to provide you with the care you need, when you need it.     Your next appointment:   7 month(s)  The format for your next appointment:   In [place]  Provider:    Glenetta Hew, MD   Other Instructions

## 2020-11-01 DIAGNOSIS — R002 Palpitations: Secondary | ICD-10-CM | POA: Diagnosis not present

## 2020-11-01 DIAGNOSIS — I061 Rheumatic aortic insufficiency: Secondary | ICD-10-CM | POA: Diagnosis not present

## 2020-11-01 DIAGNOSIS — I340 Nonrheumatic mitral (valve) insufficiency: Secondary | ICD-10-CM | POA: Diagnosis not present

## 2020-11-01 LAB — BASIC METABOLIC PANEL
BUN/Creatinine Ratio: 14 (ref 12–28)
BUN: 13 mg/dL (ref 8–27)
CO2: 27 mmol/L (ref 20–29)
Calcium: 9.6 mg/dL (ref 8.7–10.3)
Chloride: 99 mmol/L (ref 96–106)
Creatinine, Ser: 0.93 mg/dL (ref 0.57–1.00)
Glucose: 94 mg/dL (ref 65–99)
Potassium: 4.1 mmol/L (ref 3.5–5.2)
Sodium: 138 mmol/L (ref 134–144)
eGFR: 67 mL/min/{1.73_m2} (ref >59)

## 2020-11-03 ENCOUNTER — Encounter: Payer: Self-pay | Admitting: Cardiology

## 2020-11-03 NOTE — Assessment & Plan Note (Signed)
Trivial edema although bothersome to her.  Not associated with PND or orthopnea and therefore not likely heart failure related.  Plan: Start chlorthalidone 25 mg daily and recommend foot elevation/support stockings.

## 2020-11-03 NOTE — Assessment & Plan Note (Addendum)
BP still elevated.  We will plan starting HCTZ which never got started.    She is on high-dose valsartan for afterload reduction.  Heart rate is 60 at baseline so therefore beta-blocker would not be indicated.  Plan: Add chlorthalidone 25 mg daily for better full day coverage -Recheck BMP 1 week after starting

## 2020-11-03 NOTE — Assessment & Plan Note (Signed)
Multiple studies have not suggested either mild AS/AI or mild to moderate.  Murmurs are heard on exam, but do not seem to be on the valve.  I suspect that if she were to undergo mitral valve surgery, she probably would also benefit from aortic valve surgery if it gets worse, but at present, the mitral valve seems the worst.  Continue to follow echoes.-Next echo scheduled for January 2023

## 2020-11-03 NOTE — Assessment & Plan Note (Signed)
Somewhat conflicting data as far as her mitral valve disease.  TEE clearly showed severe MR, but she had a relatively asymptomatic exercise stress echo.  Unfortunately, the reader did not give much information about any change in the mild regurgitation.  Post exercise, he simply mentioned moderate appearing rheumatic mitral regurgitation..  At this point, as long as she continues to be stable symptomatically, the recommendation from Dr. Roxy Manns was to simply treat medically with blood pressure control and monitor for symptoms.  Plan: Add additional blood pressure control with chlorthalidone.  I spent about 15 to 20 minutes discussing the difference tests and then the pathophysiology of a mitral regurgitation as well as symptoms that she should be looking for.  Also told her about concerns for possible atrial fibrillation and how that would likely exacerbate symptoms.  Repeat 2D echo in January 2023  Guidelines are somewhat scant, but at this point probably does not require SBE prophylaxis.

## 2020-11-03 NOTE — Assessment & Plan Note (Signed)
This is being followed by PCP ostensibly.  LDL was 126.  With her risk factors, I think it is probably reasonable to shoot for an LDL < 100 and TC<200.  Low threshold to consider initiation of statin therapy.

## 2020-11-03 NOTE — Assessment & Plan Note (Signed)
Does not really seem to be complaining about palpitations at this time.  Event monitor did not show any significant besides one short bursts of PAT and even shorter bursts of NSVT.  We discussed symptoms that would be concerning for A. fib such as fast irregular heartbeats.  If she has any significant exacerbation of shortness of breath symptoms that could also indicate A. Fib.  Continue to monitor.  No beta-blocker because of bradycardia.

## 2020-12-01 ENCOUNTER — Other Ambulatory Visit: Payer: Self-pay | Admitting: Registered Nurse

## 2020-12-01 DIAGNOSIS — I10 Essential (primary) hypertension: Secondary | ICD-10-CM

## 2021-01-31 ENCOUNTER — Other Ambulatory Visit: Payer: Self-pay | Admitting: Gastroenterology

## 2021-02-20 ENCOUNTER — Ambulatory Visit (INDEPENDENT_AMBULATORY_CARE_PROVIDER_SITE_OTHER): Payer: PPO | Admitting: Emergency Medicine

## 2021-02-20 ENCOUNTER — Encounter: Payer: Self-pay | Admitting: Emergency Medicine

## 2021-02-20 ENCOUNTER — Other Ambulatory Visit: Payer: Self-pay

## 2021-02-20 VITALS — BP 116/70 | HR 70 | Temp 98.4°F | Ht 66.0 in | Wt 206.0 lb

## 2021-02-20 DIAGNOSIS — G4733 Obstructive sleep apnea (adult) (pediatric): Secondary | ICD-10-CM | POA: Diagnosis not present

## 2021-02-20 DIAGNOSIS — E785 Hyperlipidemia, unspecified: Secondary | ICD-10-CM

## 2021-02-20 DIAGNOSIS — I38 Endocarditis, valve unspecified: Secondary | ICD-10-CM | POA: Diagnosis not present

## 2021-02-20 DIAGNOSIS — I10 Essential (primary) hypertension: Secondary | ICD-10-CM | POA: Diagnosis not present

## 2021-02-20 DIAGNOSIS — M159 Polyosteoarthritis, unspecified: Secondary | ICD-10-CM

## 2021-02-20 DIAGNOSIS — Z23 Encounter for immunization: Secondary | ICD-10-CM

## 2021-02-20 DIAGNOSIS — Z7689 Persons encountering health services in other specified circumstances: Secondary | ICD-10-CM | POA: Diagnosis not present

## 2021-02-20 MED ORDER — ROSUVASTATIN CALCIUM 10 MG PO TABS: 10.0000 mg | ORAL_TABLET | Freq: Every day | ORAL | 3 refills | Status: DC

## 2021-02-20 NOTE — Progress Notes (Signed)
Lisa Miles 70 y.o.   Chief Complaint  Patient presents with   Transitions Of Care    Manage conditions. Rt arm pain, going on for awhile, also pt has headaches at times. Pt would like advisement for covid booster.    HISTORY OF PRESENT ILLNESS: This is a 70 y.o. female former patient of Dr. Nolon Rod at [facility] first visit to this office here to establish care with me. Patient has history of hypertension and valvular heart disease.  Sees cardiologist on a regular basis. Also has history of dyslipidemia, obstructive sleep apnea, and primary osteoarthritis of multiple joints. Has history of chronic right arm pain for at least a couple years. Fully vaccinated against COVID with boosters.  Inquiring about new vaccine. Has occasional headaches that respond to Tylenol. No other complaints or medical concerns today.  HPI   Prior to Admission medications   Medication Sig Start Date End Date Taking? Authorizing Provider  aspirin EC 81 MG tablet Take 81 mg by mouth daily. Swallow whole.    [provider]  Calcium Carbonate+Vitamin D 600-200 MG-UNIT TABS Take 2 tablets by mouth daily.    [provider]  chlorthalidone (HYGROTON) 25 MG tablet Take 1 tablet (25 mg total) by mouth daily. 10/25/20 01/23/21  Leonie Man, MD  Cholecalciferol (VITAMIN D) 50 MCG (2000 UT) CAPS Take 2,000 Units by mouth daily.    [provider]  esomeprazole (NEXIUM) 40 MG capsule TAKE 1 CAPSULE BY MOUTH TWICE DAILY BEFORE A MEAL 01/31/21   Zehr, Janett Billow D, PA-C  estradiol (ESTRACE) 2 MG tablet Take 2 mg by mouth daily.    [provider]  hydroxypropyl methylcellulose / hypromellose (ISOPTO TEARS / GONIOVISC) 2.5 % ophthalmic solution Place 1 drop into both eyes in the morning and at bedtime.    [provider]  valsartan (DIOVAN) 320 MG tablet TAKE 1 TABLET(320 MG) BY MOUTH DAILY 12/02/20   Maximiano Coss, NP    No Known Allergies  Patient Active  Problem List   Diagnosis Date Noted   OSA (obstructive sleep apnea) 11/02/2019   Non-restorative sleep 08/24/2019   Primary osteoarthritis involving multiple joints 04/01/2018   OAB (overactive bladder) 04/01/2018   Thyromegaly 03/28/2013   Essential hypertension 07/14/2012   ASTHMA UNSPECIFIED WITH EXACERBATION 04/02/2009   Heart murmur 04/02/2009    Past Medical History:  Diagnosis Date   Aortic stenosis, mild-moderate 07/2012   TTE October 21: Moderate aortic valve thickening with mild to moderate AI & mild AS; TEE: mild to moderate AI & MIld AS   Arthritis    Phreesia 11/06/2019   Asthma    Bilateral bunions    Bunionectomies performed   Bronchitis, chronic ([place])    Cataract    Phreesia 11/06/2019   Heart disease    Hyperlipidemia    Hypertension    Good control   Inguinodynia    Bilateral groin pain   Lower extremity edema    Chronic. Venous Duplex 11/06/11 SUMMARY: 1) Bilateral Lower Extremities: No evidence of DVT or thrombophlebitis.  2) Right Common Femoral Vein: Demonstrated mild valvular insufficiency with a greater than (1) sec of duration. Mildly abnormal LE Venous duplex Doppler.   Obesity    Palpitations    Relatively well controlled   Scoliosis    DG Chest 2 View x-ray on 07/30/11 by Dr. Everlene Farrier shows a scoliosis.   Severe mitral regurgitation by prior echocardiogram 07/2012   Mild AMVL prolapse, Mod MR -- no MVP noted  in 01/2018 -> 11/'21: Severe MR due to restricted movement of P3 scallop of PMVL (IIIB) due to incomplete leaflet coaptation -> thickened leaflets consistent with Rheumatic Heart Valve Disease.    Past Surgical History:  Procedure Laterality Date   ABDOMINAL HYSTERECTOMY     ANKLE ARTHROSCOPY Left    BUNIONECTOMY Bilateral    CARDIAC EVENT MONITOR  03/2020   (04/05/2020 -05/04/2020): Mostly NSR.  Heart rate range 42-148 bpm.  3 short bursts of 4-5 beats NSVT (asymptomatic).  116 beat run of PAT.  Rare PACs and PVCs.  Some bigeminy and trigeminy.    CARDIOPULMONARY EXERCISE TEST (CPX)  05/09/2020   (done with Ex St Echo): Normal Functional Capacity. No indication for CP limitations.  Body Habitus contributes to Exercise Intolerance.   EXERCISE STRESS ECHO  05/09/2020   TTE shows normal EF 65% with LVH No pericardial effusion Normal RV size and function Moderate appearing rheumatic MR/AR.;; -- NO COMMENT ON EXERCISE EFFECT ON MR!!! (even though this was the reason for ordering the test)   EYE SURGERY N/A    Phreesia 11/06/2019   KNEE ARTHROSCOPY Right    Left knee open surgery     pt thinks she only had shots in L knee, not surgery   RIGHT/LEFT HEART CATH AND CORONARY ANGIOGRAPHY N/A 03/28/2020   RIGHT/LEFT HEART CATH AND CORONARY ANGIOGRAPHY;  Leonie Man, MD;  Location: [facility] INVASIVE CV LAB; angiographically normal coronary arteries.  Cloacal LM. Normal RHC Pressures (PAP 40/13 - mean 22 mmHg, PCWP 16mmHg, V wave 25 mmHg c/w MR, LVEDP 15 mmHg); CO/CI 5.02 / 2.45 (reduced).   TEE WITHOUT CARDIOVERSION N/A 03/13/2020   Procedure: TRANSESOPHAGEAL ECHOCARDIOGRAM (TEE);  Surgeon: Geralynn Rile, MD;  [facility] ENDOSCOPY;;; Severe MR 2/2 restricted P3 scallop of PMVL (IIIB) w/ incomplete leaflet coaptation.  Thickened /degenerative leaflets c/w Rheumatic Valvular Heart Disease.  2D PISA radius 0.9 cm with 2D ERO 0.26 cm2 with R Vol 62 cc. PV blunting w/ BP 109/60 mmHg. No MS. Mild-mod AI / Mild AS. Gr 2 plaque Aorta   TOTAL ABDOMINAL HYSTERECTOMY     TRANSTHORACIC ECHOCARDIOGRAM  01/2015; 01/2016   a) Normal LV size and function. GR 1 DD. Moderate LA Dilation. Calcified aortic valve with mild AS, moderate (2+) AI. Restricted posterior MV leaflet movement w/ Mod MR. Mode TR with mod elevated PA pressures (45 mmHg);; b) Relatively stable. Moderate concentric LVH. Moderate AI with mild AS (Mean-PeaK Gradient: 19 / 36 mmHg). Mild MR. Mod LA dilation. PAP ~40 mmHg -> plan follow-up 2020.   TRANSTHORACIC ECHOCARDIOGRAM  01/2018   a) Normal LV size.   Mod Conc LVH.  EF 60-65%.  No R WMA.  GR 1 DD (high P).  Mild AS (MG 16 mmHg) w/ Mod AI.  Severe LA dilation.  Mild MR.; 01/2020:  EF 65-70%. No RWMA.  Severe MR w/ restricted PMVL movement.  (Recommend TEE).  Mod AoV thickening w/ mild to mod AS & Mod AI.  Nl RV fxn.  Mildly elevated filling P. Severe LA dilation.    Social History   Socioeconomic History   Marital status: Married    Spouse name: Herbie [place]   Number of children: 2   Years of education: Not on file   Highest education level: Not on file  Occupational History   Occupation: production    Employer: [employer]    Comment: check printing/shipping  Tobacco Use   Smoking status: Never   Smokeless tobacco: Never  Vaping Use  Vaping Use: Never used  Substance and Sexual Activity   Alcohol use: No   Drug use: No   Sexual activity: Never  Other Topics Concern   Not on file  Social History Narrative   Lives with her husband.  Their children live nearby.   Right handed   Caffeine: 1 cup/day   Social Determinants of Health   Financial Resource Strain: Not on file  Food Insecurity: Not on file  Transportation Needs: Not on file  Physical Activity: Not on file  Stress: Not on file  Social Connections: Not on file  Intimate Partner Violence: Not on file    Family History  Problem Relation Age of Onset   Arthritis Mother    Hyperlipidemia Mother    Diabetes Mother    Gout Mother    Hypertension Mother    Cancer Father        GI cancer   Diabetes Maternal Grandfather    Hypertension Brother    Diabetes Brother    Hypertension Brother    Migraines Neg Hx    Headache Neg Hx      Review of Systems  Constitutional: Negative.  Negative for chills and fever.  HENT: Negative.  Negative for congestion and sore throat.   Respiratory: Negative.  Negative for cough and shortness of breath.   Cardiovascular: Negative.  Negative for chest pain, palpitations and leg swelling.  Gastrointestinal:  Negative for  abdominal pain, diarrhea, nausea and vomiting.  Genitourinary: Negative.   Musculoskeletal:  Positive for joint pain.  Skin: Negative.  Negative for rash.  Neurological:  Negative for dizziness and headaches.  All other systems reviewed and are negative.  Today's Vitals   02/20/21 1443  BP: 116/70  Pulse: 70  Temp: 98.4 F (36.9 C)  TempSrc: Oral  SpO2: 97%  Weight: 206 lb (93.4 kg)  Height: 5\' 6"  (1.676 m)   Body mass index is 33.25 kg/m.  Physical Exam Vitals reviewed.  Constitutional:      Appearance: Normal appearance.  HENT:     Head: Normocephalic.  Eyes:     Extraocular Movements: Extraocular movements intact.     Conjunctiva/sclera: Conjunctivae normal.     Pupils: Pupils are equal, round, and reactive to light.  Cardiovascular:     Rate and Rhythm: Normal rate and regular rhythm.     Heart sounds: Murmur heard.  Pulmonary:     Effort: Pulmonary effort is normal.     Breath sounds: Normal breath sounds.  Musculoskeletal:        General: Normal range of motion.     Cervical back: Normal range of motion and neck supple.     Right lower leg: No edema.     Left lower leg: No edema.  Skin:    General: Skin is warm and dry.     Capillary Refill: Capillary refill takes less than 2 seconds.  Neurological:     General: No focal deficit present.     Mental Status: She is alert and oriented to [place], place, and time.  Psychiatric:        Mood and Affect: Mood normal.        Behavior: Behavior normal.     ASSESSMENT & PLAN: Problem List Items Addressed This Visit       Cardiovascular and Mediastinum   Essential hypertension - Primary (Chronic)    Well-controlled hypertension.  Continue valsartan 320 mg and [place] 25 mg daily.      Relevant Medications  rosuvastatin (CRESTOR) 10 MG tablet   Valvular heart disease    History of severe mitral regurgitation and aortic insufficiency.  Stable and asymptomatic.  Sees cardiologist on a regular  basis. Cardiologist last office visit assessment and plan as follows: ASSESSMENT/PLAN    Problem List Items Addressed This Visit       Moderate to severe mitral regurgitation - Primary (Chronic)      Somewhat conflicting data as far as her mitral valve disease.  TEE clearly showed severe MR, but she had a relatively asymptomatic exercise stress echo.  Unfortunately, the reader did not give much information about any change in the mild regurgitation.  Post exercise, he simply mentioned moderate appearing rheumatic mitral regurgitation..   At this point, as long as she continues to be stable symptomatically, the recommendation from Dr. Roxy Manns was to simply treat medically with blood pressure control and monitor for symptoms.   Plan: Add additional blood pressure control with chlorthalidone. I spent about 15 to 20 minutes discussing the difference tests and then the pathophysiology of a mitral regurgitation as well as symptoms that she should be looking for. Also told her about concerns for possible atrial fibrillation and how that would likely exacerbate symptoms. Repeat 2D echo in January 2023 Guidelines are somewhat scant, but at this point probably does not require SBE prophylaxis.             Relevant Medications   rosuvastatin (CRESTOR) 10 MG tablet     Respiratory   OSA (obstructive sleep apnea)    Stable on CPAP treatment.        Musculoskeletal and Integument   Primary osteoarthritis involving multiple joints     Other   Dyslipidemia    Stable.  Diet and nutrition discussed. The 10-year ASCVD risk score (Arnett DK, et al., 2019) is: 23.8%   Values used to calculate the score:     Age: 69 years     Sex: Female     Is Non-Hispanic African American: Yes     Diabetic: Yes     Tobacco smoker: No     Systolic Blood Pressure: 301 mmHg     Is BP treated: Yes     HDL Cholesterol: 66 mg/dL     Total Cholesterol: 213 mg/dL Recommend to start rosuvastatin 10 mg daily.       Relevant Medications   rosuvastatin (CRESTOR) 10 MG tablet   Other Visit Diagnoses     Need for influenza vaccination       Relevant Orders   Flu Vaccine QUAD High Dose(Fluad) (Completed)   Encounter to establish care          Patient Instructions  Hypertension, Adult High blood pressure (hypertension) is when the force of blood pumping through the arteries is too strong. The arteries are the blood vessels that carry blood from the heart throughout the body. Hypertension forces the heart to work harder to pump blood and may cause arteries to become narrow or stiff. Untreated or uncontrolled hypertension can cause a heart attack, heart failure, a stroke, kidney disease, and other problems. A blood pressure reading consists of a higher number over a lower number. Ideally, your blood pressure should be below 120/80. The first ("top") number is called the systolic pressure. It is a measure of the pressure in your arteries as your heart beats. The second ("bottom") number is called the diastolic pressure. It is a measure of the pressure in your arteries as the heart relaxes. What are  the causes? The exact cause of this condition is not known. There are some conditions that result in or are related to high blood pressure. What increases the risk? Some risk factors for high blood pressure are under your control. The following factors may make you more likely to develop this condition: Smoking. Having type 2 diabetes mellitus, high cholesterol, or both. Not getting enough exercise or physical activity. Being overweight. Having too much fat, sugar, calories, or salt (sodium) in your diet. Drinking too much alcohol. Some risk factors for high blood pressure may be difficult or impossible to change. Some of these factors include: Having chronic kidney disease. Having a family history of high blood pressure. Age. Risk increases with age. Race. You may be at higher risk if you are African  American. Gender. Men are at higher risk than women before age 85. After age 71, women are at higher risk than men. Having obstructive sleep apnea. Stress. What are the signs or symptoms? High blood pressure may not cause symptoms. Very high blood pressure (hypertensive crisis) may cause: Headache. Anxiety. Shortness of breath. Nosebleed. Nausea and vomiting. Vision changes. Severe chest pain. Seizures. How is this diagnosed? This condition is diagnosed by measuring your blood pressure while you are seated, with your arm resting on a flat surface, your legs uncrossed, and your feet flat on the floor. The cuff of the blood pressure monitor will be placed directly against the skin of your upper arm at the level of your heart. It should be measured at least twice using the same arm. Certain conditions can cause a difference in blood pressure between your right and left arms. Certain factors can cause blood pressure readings to be lower or higher than normal for a short period of time: When your blood pressure is higher when you are in a health care provider's office than when you are at home, this is called white coat hypertension. Most people with this condition do not need medicines. When your blood pressure is higher at home than when you are in a health care provider's office, this is called masked hypertension. Most people with this condition may need medicines to control blood pressure. If you have a high blood pressure reading during one visit or you have normal blood pressure with other risk factors, you may be asked to: Return on a different day to have your blood pressure checked again. Monitor your blood pressure at home for 1 week or longer. If you are diagnosed with hypertension, you may have other blood or imaging tests to help your health care provider understand your overall risk for other conditions. How is this treated? This condition is treated by making healthy lifestyle  changes, such as eating healthy foods, exercising more, and reducing your alcohol intake. Your health care provider may prescribe medicine if lifestyle changes are not enough to get your blood pressure under control, and if: Your systolic blood pressure is above 130. Your diastolic blood pressure is above 80. Your personal target blood pressure may vary depending on your medical conditions, your age, and other factors. Follow these instructions at home: Eating and drinking  Eat a diet that is high in fiber and potassium, and low in sodium, added sugar, and fat. An example eating plan is called the DASH (Dietary Approaches to Stop Hypertension) diet. To eat this way: Eat plenty of fresh fruits and vegetables. Try to fill one half of your plate at each meal with fruits and vegetables. Eat whole grains, such  as whole-wheat pasta, brown rice, or whole-grain bread. Fill about one fourth of your plate with whole grains. Eat or drink low-fat dairy products, such as skim milk or low-fat yogurt. Avoid fatty cuts of meat, processed or cured meats, and poultry with skin. Fill about one fourth of your plate with lean proteins, such as fish, chicken without skin, beans, eggs, or tofu. Avoid pre-made and processed foods. These tend to be higher in sodium, added sugar, and fat. Reduce your daily sodium intake. Most people with hypertension should eat less than 1,500 mg of sodium a day. Do not drink alcohol if: Your health care provider tells you not to drink. You are pregnant, may be pregnant, or are planning to become pregnant. If you drink alcohol: Limit how much you use to: 0-1 drink a day for women. 0-2 drinks a day for men. Be aware of how much alcohol is in your drink. In the [place] one drink equals one 12 oz bottle of beer (355 mL), one 5 oz glass of wine (148 mL), or one 1 oz glass of hard liquor (44 mL). Lifestyle  Work with your health care provider to maintain a healthy body weight or to lose  weight. Ask what an ideal weight is for you. Get at least 30 minutes of exercise most days of the week. Activities may include walking, swimming, or biking. Include exercise to strengthen your muscles (resistance exercise), such as Pilates or lifting weights, as part of your weekly exercise routine. Try to do these types of exercises for 30 minutes at least 3 days a week. Do not use any products that contain nicotine or tobacco, such as cigarettes, e-cigarettes, and chewing tobacco. If you need help quitting, ask your health care provider. Monitor your blood pressure at home as told by your health care provider. Keep all follow-up visits as told by your health care provider. This is important. Medicines Take over-the-counter and prescription medicines only as told by your health care provider. Follow directions carefully. Blood pressure medicines must be taken as prescribed. Do not skip doses of blood pressure medicine. Doing this puts you at risk for problems and can make the medicine less effective. Ask your health care provider about side effects or reactions to medicines that you should watch for. Contact a health care provider if you: Think you are having a reaction to a medicine you are taking. Have headaches that keep coming back (recurring). Feel dizzy. Have swelling in your ankles. Have trouble with your vision. Get help right away if you: Develop a severe headache or confusion. Have unusual weakness or numbness. Feel faint. Have severe pain in your chest or abdomen. Vomit repeatedly. Have trouble breathing. Summary Hypertension is when the force of blood pumping through your arteries is too strong. If this condition is not controlled, it may put you at risk for serious complications. Your personal target blood pressure may vary depending on your medical conditions, your age, and other factors. For most people, a normal blood pressure is less than 120/80. Hypertension is treated  with lifestyle changes, medicines, or a combination of both. Lifestyle changes include losing weight, eating a healthy, low-sodium diet, exercising more, and limiting alcohol. This information is not intended to replace advice given to you by your health care provider. Make sure you discuss any questions you have with your health care provider. Document Revised: 12/22/2017 Document Reviewed: 12/22/2017 Elsevier Patient Education  2022 [place], MD [facility] [place]  [facility]

## 2021-02-20 NOTE — Assessment & Plan Note (Addendum)
Stable.  Diet and nutrition discussed. The 10-year ASCVD risk score (Arnett DK, et al., 2019) is: 23.8%   Values used to calculate the score:     Age: 70 years     Sex: Female     Is Non-Hispanic African American: Yes     Diabetic: Yes     Tobacco smoker: No     Systolic Blood Pressure: 217 mmHg     Is BP treated: Yes     HDL Cholesterol: 66 mg/dL     Total Cholesterol: 213 mg/dL Recommend to start rosuvastatin 10 mg daily.

## 2021-02-20 NOTE — Assessment & Plan Note (Signed)
History of severe mitral regurgitation and aortic insufficiency.  Stable and asymptomatic.  Sees cardiologist on a regular basis. Cardiologist last office visit assessment and plan as follows: ASSESSMENT/PLAN    Problem List Items Addressed This Visit       Moderate to severe mitral regurgitation - Primary (Chronic)      Somewhat conflicting data as far as her mitral valve disease.  TEE clearly showed severe MR, but she had a relatively asymptomatic exercise stress echo.  Unfortunately, the reader did not give much information about any change in the mild regurgitation.  Post exercise, he simply mentioned moderate appearing rheumatic mitral regurgitation..   At this point, as long as she continues to be stable symptomatically, the recommendation from Dr. Roxy Manns was to simply treat medically with blood pressure control and monitor for symptoms.   Plan: Add additional blood pressure control with chlorthalidone. . I spent about 15 to 20 minutes discussing the difference tests and then the pathophysiology of a mitral regurgitation as well as symptoms that she should be looking for. . Also told her about concerns for possible atrial fibrillation and how that would likely exacerbate symptoms. . Repeat 2D echo in January 2023 . Guidelines are somewhat scant, but at this point probably does not require SBE prophylaxis.

## 2021-02-20 NOTE — Assessment & Plan Note (Signed)
Well-controlled hypertension.  Continue valsartan 320 mg and [place] 25 mg daily.

## 2021-02-20 NOTE — Assessment & Plan Note (Signed)
Stable on CPAP treatment. 

## 2021-02-20 NOTE — Patient Instructions (Signed)

## 2021-02-21 ENCOUNTER — Telehealth: Payer: Self-pay | Admitting: Emergency Medicine

## 2021-02-21 NOTE — Telephone Encounter (Signed)
Patient calling to inform she had a mammogram done on 09-27-2020  Patient also requesting information on a new rx  Patient do not know name of rx  Patient requesting a call back to discuss rx

## 2021-02-21 NOTE — Telephone Encounter (Signed)
Called and spoke with pt, she verb understanding.

## 2021-03-01 ENCOUNTER — Other Ambulatory Visit: Payer: Self-pay | Admitting: Registered Nurse

## 2021-03-01 DIAGNOSIS — I10 Essential (primary) hypertension: Secondary | ICD-10-CM

## 2021-03-03 ENCOUNTER — Other Ambulatory Visit: Payer: Self-pay | Admitting: Emergency Medicine

## 2021-03-03 DIAGNOSIS — I10 Essential (primary) hypertension: Secondary | ICD-10-CM

## 2021-04-01 ENCOUNTER — Other Ambulatory Visit: Payer: Self-pay | Admitting: *Deleted

## 2021-04-04 ENCOUNTER — Telehealth: Payer: Self-pay | Admitting: Cardiology

## 2021-04-04 NOTE — Telephone Encounter (Signed)
   Pt is calling, she said she called [facility] and was told Dr. Roxy Manns id leaving and she will see Dr. Cyndia Bent. She wanted to know if Dr. Ellyn Hack knows that and if that's ok

## 2021-04-05 NOTE — Telephone Encounter (Signed)
Yes I know about it.  Dr. Cyndia Bent is 1 of Dr. Guy Sandifer for partners here still in [place].  He would be the next logical Smither to take over.  Glenetta Hew, MD

## 2021-04-30 ENCOUNTER — Encounter (HOSPITAL_COMMUNITY): Payer: Self-pay | Admitting: Cardiology

## 2021-04-30 ENCOUNTER — Ambulatory Visit (HOSPITAL_COMMUNITY): Payer: PPO | Attending: Cardiology

## 2021-05-09 ENCOUNTER — Other Ambulatory Visit: Payer: Self-pay

## 2021-05-09 ENCOUNTER — Ambulatory Visit (HOSPITAL_COMMUNITY): Payer: PPO | Attending: Cardiovascular Disease

## 2021-05-09 DIAGNOSIS — I061 Rheumatic aortic insufficiency: Secondary | ICD-10-CM | POA: Diagnosis not present

## 2021-05-09 DIAGNOSIS — I340 Nonrheumatic mitral (valve) insufficiency: Secondary | ICD-10-CM

## 2021-05-09 DIAGNOSIS — R002 Palpitations: Secondary | ICD-10-CM | POA: Insufficient documentation

## 2021-05-09 HISTORY — PX: TRANSTHORACIC ECHOCARDIOGRAM: SHX275

## 2021-05-09 LAB — ECHOCARDIOGRAM COMPLETE
AR max vel: 1.89 cm2
AV Area VTI: 1.78 cm2
AV Area mean vel: 1.72 cm2
AV Mean grad: 17 mmHg
AV Peak grad: 30.6 mmHg
Ao pk vel: 2.77 m/s
Area-P 1/2: 3.12 cm2
MV M vel: 5.09 m/s
MV Peak grad: 103.6 mmHg
P 1/2 time: 70.8 msec
P 1/2 time: 717 msec
Radius: 1.15 cm
S' Lateral: 2 cm

## 2021-05-12 ENCOUNTER — Telehealth: Payer: Self-pay | Admitting: Gastroenterology

## 2021-05-12 ENCOUNTER — Encounter: Payer: Self-pay | Admitting: Gastroenterology

## 2021-05-12 NOTE — Telephone Encounter (Signed)
I have advised the pt that she can take two 20 mg tablets to equal her 40 mg dose.  The pt has been advised of the information and verbalized understanding.

## 2021-05-12 NOTE — Telephone Encounter (Signed)
Patient recently switched from 20 mg of esomeprazole to 40 mg.  She has a lot of the 20 mg tablets lerft.  Can she take two of them to equal up to the 40 mg?  Please call and advise.  Thank you.

## 2021-05-21 ENCOUNTER — Other Ambulatory Visit: Payer: Self-pay

## 2021-05-21 ENCOUNTER — Encounter: Payer: Self-pay | Admitting: Surgery

## 2021-05-21 ENCOUNTER — Ambulatory Visit (INDEPENDENT_AMBULATORY_CARE_PROVIDER_SITE_OTHER): Payer: PPO | Admitting: Surgery

## 2021-05-21 VITALS — BP 138/78 | HR 73 | Resp 20 | Ht 66.0 in | Wt 207.0 lb

## 2021-05-21 DIAGNOSIS — I340 Nonrheumatic mitral (valve) insufficiency: Secondary | ICD-10-CM

## 2021-05-21 DIAGNOSIS — I351 Nonrheumatic aortic (valve) insufficiency: Secondary | ICD-10-CM | POA: Diagnosis not present

## 2021-05-21 DIAGNOSIS — I061 Rheumatic aortic insufficiency: Secondary | ICD-10-CM | POA: Diagnosis not present

## 2021-05-25 NOTE — Progress Notes (Signed)
HPI:  The patient is a 71 year old African-American female originally from [place] with a history of likely rheumatic heart disease with mild aortic stenosis, mild to moderate aortic insufficiency, and moderate to severe primary mitral regurgitation who has been asymptomatic. She was seen last by Dr. Roxy Manns in January 2022 and a cardiopulmonary exercise test revealed no sign of significant cardiopulmonary limitation with normal functional capacity.  Stress echocardiogram revealed findings consistent with moderate aortic insufficiency and moderate mitral regurgitation that did not get worse with exercise.  An event monitor revealed mostly sinus rhythm with no documented episodes of atrial fibrillation. It was decided that continued medical therapy was indicated.  Over the past year she has remained asymptomatic with good functional capacity.  A recent 2D echocardiogram on 05/09/2021 was essentially unchanged with moderate to severe mitral regurgitation, a calcified aortic valve with mild stenosis and moderate regurgitation, and an ejection fraction of 60 to 65%.  Current Outpatient Medications  Medication Sig Dispense Refill   amoxicillin (AMOXIL) 500 MG tablet Take 500 mg by mouth See admin instructions. Reports taking 4 capsules before dental procedures     aspirin EC 81 MG tablet Take 81 mg by mouth daily. Swallow whole.     Calcium Carbonate+Vitamin D 600-200 MG-UNIT TABS Take 2 tablets by mouth daily.     Cholecalciferol (VITAMIN D) 50 MCG (2000 UT) CAPS Take 2,000 Units by mouth daily.     esomeprazole (NEXIUM) 40 MG capsule TAKE 1 CAPSULE BY MOUTH TWICE DAILY BEFORE A MEAL 180 capsule 1   estradiol (ESTRACE) 2 MG tablet Take 2 mg by mouth daily.     hydroxypropyl methylcellulose / hypromellose (ISOPTO TEARS / GONIOVISC) 2.5 % ophthalmic solution Place 1 drop into both eyes in the morning and at bedtime.     rosuvastatin (CRESTOR) 10 MG tablet Take 1 tablet (10 mg total) by mouth daily. 90  tablet 3   traMADol (ULTRAM) 50 MG tablet Take 50 mg by mouth every 6 (six) hours as needed.     valsartan (DIOVAN) 320 MG tablet TAKE 1 TABLET(320 MG) BY MOUTH DAILY 90 tablet 0   chlorthalidone (HYGROTON) 25 MG tablet Take 1 tablet (25 mg total) by mouth daily. 30 tablet 11   No current facility-administered medications for this visit.     Physical Exam: BP 138/78 (BP Location: Left Arm, Patient Position: Sitting)    Pulse 73    Resp 20    Ht 5\' 6"  (1.676 m)    Wt 207 lb (93.9 kg)    SpO2 100% Comment: RA   BMI 33.41 kg/m  She looks well. Cardiac exam shows a regular rate and rhythm with a 2/6 systolic murmur along the right sternal border.  There is no diastolic murmur. Lungs are clear. There is no peripheral edema.  Diagnostic Tests:  ECHOCARDIOGRAM REPORT         Patient Name:   Lisa Miles Date of Exam: 05/09/2021  Medical Rec #:  [id number]       Height:       66.0 in  Accession #:    [id number]      Weight:       206.0 lb  Date of Birth:  01-04-1951       BSA:          2.025 m  Patient Age:    83 years        BP:           116/70 mmHg  Patient Gender: F               HR:           59 bpm.  Exam Location:  [address]   Procedure: 2D Echo, 3D Echo, Cardiac Doppler and Color Doppler   Indications:    I35.9 Aortic Valve Disorder                  I05.9 Mitral Valve Disorder     History:        Patient has prior history of Echocardiogram examinations,  most                  recent 01/31/2020. Aortic Valve Disease and Mitral Valve  Disease,                  Signs/Symptoms:Edema; Risk Factors:Hypertension and                  Dyslipidemia. Obesity, Palpitations, Aortic Stenosis  (Prior Mean                  Gradient 40mmHG).     Sonographer:    Deliah Boston RDCS  Referring Phys: Leonie Green HARDING   IMPRESSIONS     1. Left ventricular ejection fraction, by estimation, is 60 to 65%. Left  ventricular ejection fraction by 3D volume is 71 %. The left ventricle  has  normal function. The left ventricle has no regional wall motion  abnormalities. Left ventricular diastolic   parameters are consistent with Grade I diastolic dysfunction (impaired  relaxation). Elevated left atrial pressure.   2. Right ventricular systolic function is normal. The right ventricular  size is normal. There is normal pulmonary artery systolic pressure.   3. Left atrial size was moderately dilated.   4. The mitral valve is rheumatic. Moderate to severe mitral valve  regurgitation. No evidence of mitral stenosis.   5. The aortic valve is tricuspid. There is mild calcification of the  aortic valve. There is moderate thickening of the aortic valve. Aortic  valve regurgitation is moderate. Mild aortic valve stenosis.   6. There is mild dilatation of the ascending aorta, measuring 39 mm.   FINDINGS   Left Ventricle: Left ventricular ejection fraction, by estimation, is 60  to 65%. Left ventricular ejection fraction by 3D volume is 71 %. The left  ventricle has normal function. The left ventricle has no regional wall  motion abnormalities. The left  ventricular internal cavity size was normal in size. There is no left  ventricular hypertrophy. Left ventricular diastolic parameters are  consistent with Grade I diastolic dysfunction (impaired relaxation).  Elevated left atrial pressure.   Right Ventricle: The right ventricular size is normal. No increase in  right ventricular wall thickness. Right ventricular systolic function is  normal. There is normal pulmonary artery systolic pressure. The tricuspid  regurgitant velocity is 2.54 m/s, and   with an assumed right atrial pressure of 3 mmHg, the estimated right  ventricular systolic pressure is 95.1 mmHg.   Left Atrium: Left atrial size was moderately dilated.   Right Atrium: Right atrial size was normal in size.   Pericardium: There is no evidence of pericardial effusion.   Mitral Valve: The mitral valve is rheumatic.  Moderate to severe mitral  valve regurgitation, with centrally-directed jet. No evidence of mitral  valve stenosis. The mean mitral valve gradient is 1.6 mmHg with average  heart rate of 55 bpm.   Tricuspid Valve: The  tricuspid valve is normal in structure. Tricuspid  valve regurgitation is mild.   Aortic Valve: The aortic valve is tricuspid. There is mild calcification  of the aortic valve. There is moderate thickening of the aortic valve.  Aortic valve regurgitation is moderate. Aortic regurgitation PHT measures  717 msec. Mild aortic stenosis is  present. Aortic valve mean gradient measures 17.0 mmHg. Aortic valve peak  gradient measures 30.6 mmHg. Aortic valve area, by VTI measures 1.78 cm.   Pulmonic Valve: The pulmonic valve was grossly normal. Pulmonic valve  regurgitation is not visualized.   Aorta: The aortic root is normal in size and structure. There is mild  dilatation of the ascending aorta, measuring 39 mm.   IAS/Shunts: No atrial level shunt detected by color flow Doppler.      LEFT VENTRICLE  PLAX 2D  LVIDd:         5.20 cm         Diastology  LVIDs:         2.00 cm         LV e' medial:    5.87 cm/s  LV PW:         0.90 cm         LV E/e' medial:  17.4  LV IVS:        1.10 cm         LV e' lateral:   6.74 cm/s  LVOT diam:     2.10 cm         LV E/e' lateral: 15.1  LV SV:         114  LV SV Index:   57  LVOT Area:     3.46 cm        3D Volume EF                                 LV 3D EF:    Left                                              ventricul                                              ar                                              ejection                                              fraction                                              by 3D  volume is                                              71 %.                                    3D Volume EF:                                 3D EF:        71 %                                  LV EDV:       135 ml                                 LV ESV:       39 ml                                 LV SV:        96 ml   RIGHT VENTRICLE  RV S prime:     16.90 cm/s  TAPSE (M-mode): 2.0 cm   LEFT ATRIUM             Index        RIGHT ATRIUM           Index  LA diam:        4.90 cm 2.42 cm/m   RA Area:     16.70 cm  LA Vol (A2C):   74.8 ml 36.94 ml/m  RA Volume:   43.60 ml  21.53 ml/m  LA Vol (A4C):   89.9 ml 44.39 ml/m  LA Biplane Vol: 82.1 ml 40.54 ml/m   AORTIC VALVE  AV Area (Vmax):    1.89 cm  AV Area (Vmean):   1.72 cm  AV Area (VTI):     1.78 cm  AV Vmax:           276.80 cm/s  AV Vmean:          189.600 cm/s  AV VTI:            0.643 m  AV Peak Grad:      30.6 mmHg  AV Mean Grad:      17.0 mmHg  LVOT Vmax:         151.00 cm/s  LVOT Vmean:        94.150 cm/s  LVOT VTI:          0.330 m  LVOT/AV VTI ratio: 0.51  AI PHT:            717 msec     AORTA  Ao Root diam: 3.60 cm  Ao Asc diam:  3.75 cm   MITRAL VALVE                  TRICUSPID VALVE  MV Area (PHT): cm            TR Peak grad:   25.8 mmHg  MV  Mean grad:  1.6 mmHg       TR Vmax:        254.00 cm/s  MV PHT:        70.79 msec  MV Decel Time: 243 msec       SHUNTS  MR Peak grad:    103.6 mmHg   Systemic VTI:  0.33 m  MR Mean grad:    61.0 mmHg    Systemic Diam: 2.10 cm  MR Vmax:         509.00 cm/s  MR Vmean:        362.0 cm/s  MR PISA:         8.31 cm  MR PISA Eff ROA: 50 mm  MR PISA Radius:  1.15 cm  MV E velocity: 101.97 cm/s  MV A velocity: 128.00 cm/s  MV E/A ratio:  0.80   Mihai Croitoru MD  Electronically signed by Sanda Klein MD  Signature Date/Time: 05/09/2021/4:31:08 PM         Final    Impression:  This 71 year old patient has rheumatic heart disease with mild aortic stenosis, moderate aortic insufficiency, and moderate to severe mitral regurgitation.  She remains asymptomatic with normal left ventricular function.  I think it is  reasonable to continue close observation with medical therapy and follow-up echocardiogram on an annual basis.  I reviewed the echo images with her and answered all questions.  Plan:  She will continue follow-up with Dr. Ellyn Hack and I will be happy to see her back if there is any change in her echocardiogram or if she becomes symptomatic.  I spent 20 minutes performing this established patient evaluation and > 50% of this time was spent face to face counseling and coordinating the care of this patient's rheumatic heart disease.    Gaye Pollack, MD [facility] [phone]

## 2021-05-28 HISTORY — PX: OTHER SURGICAL HISTORY: SHX169

## 2021-06-02 ENCOUNTER — Other Ambulatory Visit: Payer: Self-pay | Admitting: Emergency Medicine

## 2021-06-02 DIAGNOSIS — I10 Essential (primary) hypertension: Secondary | ICD-10-CM

## 2021-06-06 ENCOUNTER — Ambulatory Visit (INDEPENDENT_AMBULATORY_CARE_PROVIDER_SITE_OTHER): Payer: PPO | Admitting: Cardiology

## 2021-06-06 ENCOUNTER — Encounter: Payer: Self-pay | Admitting: Cardiology

## 2021-06-06 ENCOUNTER — Other Ambulatory Visit: Payer: Self-pay

## 2021-06-06 ENCOUNTER — Ambulatory Visit (INDEPENDENT_AMBULATORY_CARE_PROVIDER_SITE_OTHER): Payer: PPO

## 2021-06-06 ENCOUNTER — Other Ambulatory Visit: Payer: Self-pay | Admitting: Cardiology

## 2021-06-06 VITALS — BP 126/74 | HR 54 | Ht 65.0 in | Wt 210.8 lb

## 2021-06-06 DIAGNOSIS — R002 Palpitations: Secondary | ICD-10-CM | POA: Diagnosis not present

## 2021-06-06 DIAGNOSIS — I10 Essential (primary) hypertension: Secondary | ICD-10-CM

## 2021-06-06 DIAGNOSIS — I498 Other specified cardiac arrhythmias: Secondary | ICD-10-CM

## 2021-06-06 DIAGNOSIS — I350 Nonrheumatic aortic (valve) stenosis: Secondary | ICD-10-CM | POA: Diagnosis not present

## 2021-06-06 DIAGNOSIS — I340 Nonrheumatic mitral (valve) insufficiency: Secondary | ICD-10-CM | POA: Diagnosis not present

## 2021-06-06 NOTE — Progress Notes (Unsigned)
Primary Care Provider: Horald Pollen, MD Cardiologist: Glenetta Hew, MD Electrophysiologist: None  Clinic Note: No chief complaint on file.   ===================================  ASSESSMENT/PLAN   Problem List Items Addressed This Visit   None   ===================================  HPI:    Lisa Miles is a 71 y.o. female with a PMH below who presents today for ***. Lisa Miles is a 71 y.o. female who is being seen today for the evaluation of *** at the request of No ref. provider found.  Lisa Miles was last seen on ***  Recent Hospitalizations: ***  Reviewed  CV studies:    The following studies were reviewed today: (if available, images/films reviewed: From Epic Chart or Care Everywhere) TTE 05/09/2021: 1. Left ventricular ejection fraction, by estimation, is 60 to 65%. Left  ventricular ejection fraction by 3D volume is 71 %. The left ventricle has  normal function. The left ventricle has no regional wall motion  abnormalities. Left ventricular diastolic   parameters are consistent with Grade I diastolic dysfunction (impaired  relaxation). Elevated left atrial pressure.   2. Right ventricular systolic function is normal. The right ventricular  size is normal. There is normal pulmonary artery systolic pressure.   3. Left atrial size was moderately dilated.   4. The mitral valve is rheumatic. Moderate to severe mitral valve  regurgitation. No evidence of mitral stenosis.   5. The aortic valve is tricuspid. There is mild calcification of the  aortic valve. There is moderate thickening of the aortic valve. Aortic  valve regurgitation is moderate. Mild aortic valve stenosis.   6. There is mild dilatation of the ascending aorta, measuring 39 mm.   Interval History:   Lisa Miles   CV Review of Symptoms (Summary) Cardiovascular ROS: {roscv:310661}  REVIEWED OF SYSTEMS   ROS  I have reviewed and (if needed) personally updated the  patient's problem list, medications, allergies, past medical and surgical history, social and family history.   PAST MEDICAL HISTORY   Past Medical History:  Diagnosis Date   Aortic stenosis, mild-moderate 07/2012   TTE October 21: Moderate aortic valve thickening with mild to moderate AI & mild AS; TEE: mild to moderate AI & MIld AS   Arthritis    Phreesia 11/06/2019   Asthma    Bilateral bunions    Bunionectomies performed   Bronchitis, chronic ([place])    Cataract    Phreesia 11/06/2019   Heart disease    Hyperlipidemia    Hypertension    Good control   Inguinodynia    Bilateral groin pain   Lower extremity edema    Chronic. Venous Duplex 11/06/11 SUMMARY: 1) Bilateral Lower Extremities: No evidence of DVT or thrombophlebitis.  2) Right Common Femoral Vein: Demonstrated mild valvular insufficiency with a greater than (1) sec of duration. Mildly abnormal LE Venous duplex Doppler.   Obesity    Palpitations    Relatively well controlled   Scoliosis    DG Chest 2 View x-ray on 07/30/11 by Dr. Everlene Farrier shows a scoliosis.   Severe mitral regurgitation by prior echocardiogram 07/2012   Mild AMVL prolapse, Mod MR -- no MVP noted in 01/2018 -> 11/'21: Severe MR due to restricted movement of P3 scallop of PMVL (IIIB) due to incomplete leaflet coaptation -> thickened leaflets consistent with Rheumatic Heart Valve Disease.    PAST SURGICAL HISTORY   Past Surgical History:  Procedure Laterality Date   ABDOMINAL HYSTERECTOMY     ANKLE ARTHROSCOPY Left  BUNIONECTOMY Bilateral    CARDIAC EVENT MONITOR  03/2020   (04/05/2020 -05/04/2020): Mostly NSR.  Heart rate range 42-148 bpm.  3 short bursts of 4-5 beats NSVT (asymptomatic).  116 beat run of PAT.  Rare PACs and PVCs.  Some bigeminy and trigeminy.   CARDIOPULMONARY EXERCISE TEST (CPX)  05/09/2020   (done with Ex St Echo): Normal Functional Capacity. No indication for CP limitations.  Body Habitus contributes to Exercise Intolerance.   EXERCISE  STRESS ECHO  05/09/2020   TTE shows normal EF 65% with LVH No pericardial effusion Normal RV size and function Moderate appearing rheumatic MR/AR.;; -- NO COMMENT ON EXERCISE EFFECT ON MR!!! (even though this was the reason for ordering the test)   EYE SURGERY N/A    Phreesia 11/06/2019   KNEE ARTHROSCOPY Right    Left knee open surgery     pt thinks she only had shots in L knee, not surgery   RIGHT/LEFT HEART CATH AND CORONARY ANGIOGRAPHY N/A 03/28/2020   RIGHT/LEFT HEART CATH AND CORONARY ANGIOGRAPHY;  Leonie Man, MD;  Location: [facility] INVASIVE CV LAB; angiographically normal coronary arteries.  Cloacal LM. Normal RHC Pressures (PAP 40/13 - mean 22 mmHg, PCWP 38mmHg, V wave 25 mmHg c/w MR, LVEDP 15 mmHg); CO/CI 5.02 / 2.45 (reduced).   TEE WITHOUT CARDIOVERSION N/A 03/13/2020   Procedure: TRANSESOPHAGEAL ECHOCARDIOGRAM (TEE);  Surgeon: Geralynn Rile, MD;  [facility] ENDOSCOPY;;; Severe MR 2/2 restricted P3 scallop of PMVL (IIIB) w/ incomplete leaflet coaptation.  Thickened /degenerative leaflets c/w Rheumatic Valvular Heart Disease.  2D PISA radius 0.9 cm with 2D ERO 0.26 cm2 with R Vol 62 cc. PV blunting w/ BP 109/60 mmHg. No MS. Mild-mod AI / Mild AS. Gr 2 plaque Aorta   TOTAL ABDOMINAL HYSTERECTOMY     TRANSTHORACIC ECHOCARDIOGRAM  01/2015; 01/2016   a) Normal LV size and function. GR 1 DD. Moderate LA Dilation. Calcified aortic valve with mild AS, moderate (2+) AI. Restricted posterior MV leaflet movement w/ Mod MR. Mode TR with mod elevated PA pressures (45 mmHg);; b) Relatively stable. Moderate concentric LVH. Moderate AI with mild AS (Mean-PeaK Gradient: 19 / 36 mmHg). Mild MR. Mod LA dilation. PAP ~40 mmHg -> plan follow-up 2020.   TRANSTHORACIC ECHOCARDIOGRAM  01/2018   a) Normal LV size.  Mod Conc LVH.  EF 60-65%.  No R WMA.  GR 1 DD (high P).  Mild AS (MG 16 mmHg) w/ Mod AI.  Severe LA dilation.  Mild MR.; 01/2020:  EF 65-70%. No RWMA.  Severe MR w/ restricted PMVL movement.  (Recommend  TEE).  Mod AoV thickening w/ mild to mod AS & Mod AI.  Nl RV fxn.  Mildly elevated filling P. Severe LA dilation.    Immunization History  Administered Date(s) Administered   Fluad Quad(high Dose 65+) 01/06/2019, 02/20/2021   Influenza Whole 03/27/2009   Influenza, High Dose Seasonal PF 04/01/2018   Influenza,inj,Quad PF,6+ Mos 03/15/2012, 03/28/2013, 07/22/2016   Moderna Sars-Covid-2 Vaccination 06/10/2019, 07/08/2019, 02/27/2020, 09/02/2020   Pneumococcal Conjugate-13 07/22/2016   Pneumococcal Polysaccharide-23 04/02/2009, 09/22/2018   Tdap 03/28/2013    MEDICATIONS/ALLERGIES   Current Meds  Medication Sig   amoxicillin (AMOXIL) 500 MG tablet Take 500 mg by mouth See admin instructions. Reports taking 4 capsules before dental procedures   aspirin EC 81 MG tablet Take 81 mg by mouth daily. Swallow whole.   chlorthalidone (HYGROTON) 25 MG tablet Take 1 tablet (25 mg total) by mouth daily.   Cholecalciferol (VITAMIN D) 50  MCG (2000 UT) CAPS Take 2,000 Units by mouth daily.   esomeprazole (NEXIUM) 40 MG capsule TAKE 1 CAPSULE BY MOUTH TWICE DAILY BEFORE A MEAL   estradiol (ESTRACE) 2 MG tablet Take 2 mg by mouth daily.   hydroxypropyl methylcellulose / hypromellose (ISOPTO TEARS / GONIOVISC) 2.5 % ophthalmic solution Place 1 drop into both eyes in the morning and at bedtime.   MODERNA COVID-19 BIVAL BOOSTER 50 MCG/0.5ML injection    rosuvastatin (CRESTOR) 10 MG tablet Take 1 tablet (10 mg total) by mouth daily.   traMADol (ULTRAM) 50 MG tablet Take 50 mg by mouth every 6 (six) hours as needed.   valsartan (DIOVAN) 320 MG tablet TAKE 1 TABLET(320 MG) BY MOUTH DAILY    No Known Allergies  SOCIAL HISTORY/FAMILY HISTORY   Reviewed in Epic:  Pertinent findings:  Social History   Tobacco Use   Smoking status: Never   Smokeless tobacco: Never  Vaping Use   Vaping Use: Never used  Substance Use Topics   Alcohol use: No   Drug use: No   Social History   Social History  Narrative   Lives with her husband.  Their children live nearby.   Right handed   Caffeine: 1 cup/day    OBJCTIVE -PE, EKG, labs   Wt Readings from Last 3 Encounters:  06/06/21 210 lb 12.8 oz (95.6 kg)  05/21/21 207 lb (93.9 kg)  02/20/21 206 lb (93.4 kg)    Physical Exam: BP 126/74    Pulse (!) 54    Ht 5\' 5"  (1.651 m)    Wt 210 lb 12.8 oz (95.6 kg)    SpO2 97%    BMI 35.08 kg/m  Physical Exam   Adult ECG Report  Rate: *** ;  Rhythm: {rhythm:17366};   Narrative Interpretation: ***  Recent Labs:  ***  Lab Results  Component Value Date   CHOL 213 (H) 06/11/2020   HDL 66 06/11/2020   LDLCALC 126 (H) 06/11/2020   TRIG 122 06/11/2020   CHOLHDL 3.2 06/11/2020   Lab Results  Component Value Date   CREATININE 0.93 11/01/2020   BUN 13 11/01/2020   NA 138 11/01/2020   K 4.1 11/01/2020   CL 99 11/01/2020   CO2 27 11/01/2020   CBC Latest Ref Rng & Units 06/11/2020 03/28/2020 03/28/2020  WBC 3.4 - 10.8 x10E3/uL 4.5 - -  Hemoglobin 11.1 - 15.9 g/dL 13.5 11.6(L) 11.6(L)  Hematocrit 34.0 - 46.6 % 40.1 34.0(L) 34.0(L)  Platelets 150 - 450 x10E3/uL - - -    Lab Results  Component Value Date   HGBA1C 5.4 06/11/2020   Lab Results  Component Value Date   TSH 2.470 06/11/2020    ==================================================  COVID-19 Education: The signs and symptoms of COVID-19 were discussed with the patient and how to seek care for testing (follow up with PCP or arrange E-visit).    I spent a total of ***minutes with the patient spent in direct patient consultation.  Additional time spent with chart review  / charting (studies, outside notes, etc): *** min Total Time: *** min  Current medicines are reviewed at length with the patient today.  (+/- concerns) ***  This visit occurred during the SARS-CoV-2 public health emergency.  Safety protocols were in place, including screening questions prior to the visit, additional usage of staff PPE, and extensive cleaning of  exam room while observing appropriate contact time as indicated for disinfecting solutions.  Notice: This dictation was prepared with Dragon dictation along with  smart Company secretary. Any transcriptional errors that result from this process are unintentional and may not be corrected upon review.  Studies Ordered:   No orders of the defined types were placed in this encounter.   Patient Instructions / Medication Changes & Studies & Tests Ordered   There are no Patient Instructions on file for this visit.     Glenetta Hew, M.D., M.S. Interventional Cardiologist   Pager # [phone] Phone # [phone] [address]. [place], [place] [address]   Thank you for choosing Heart[facility] at [facility]!!

## 2021-06-06 NOTE — Progress Notes (Unsigned)
Enrolled for [employer] to mail a ZIO XT long term holter monitor to the patients address on file.  

## 2021-06-06 NOTE — Patient Instructions (Addendum)
Medication Instructions:  No changes  *If you need a refill on your cardiac medications before your next appointment, please call your pharmacy*   Lab Work: Not needed    Testing/Procedures: Will be mailed to you 3 to 5 days- Your physician has recommended that you wear a holter monitor 7 days ZIO. Holter monitors are medical devices that record the hearts electrical activity. Doctors most often use these monitors to diagnose arrhythmias. Arrhythmias are problems with the speed or rhythm of the heartbeat. The monitor is a small, portable device. You can wear one while you do your normal daily activities. This is usually used to diagnose what is causing palpitations/syncope (passing out).   Follow-Up: At [facility], you and your health needs are our priority.  As part of our continuing mission to provide you with exceptional heart care, we have created designated Provider Care Teams.  These Care Teams include your primary Cardiologist (physician) and Advanced Practice Providers (APPs -  Physician Assistants and Nurse Practitioners) who all work together to provide you with the care you need, when you need it.     Your next appointment:   6 month(s)  Unless monitor shows some abnormalities  The format for your next appointment:   In [place]  Provider:   Glenetta Hew, MD    Other Instructions  ZIO XT- Long Term Monitor Instructions  Your physician has requested you wear a ZIO patch monitor for 7 days.  This is a single patch monitor. [employer] supplies one patch monitor per enrollment. Additional stickers are not available. Please do not apply patch if you will be having a Nuclear Stress Test,  Echocardiogram, Cardiac CT, MRI, or Chest Xray during the period you would be wearing the  monitor. The patch cannot be worn during these tests. You cannot remove and re-apply the  ZIO XT patch monitor.  Your ZIO patch monitor will be mailed 3 day USPS to your address on file. It may  take 3-5 days  to receive your monitor after you have been enrolled.  Once you have received your monitor, please review the enclosed instructions. Your monitor  has already been registered assigning a specific monitor serial # to you.  Billing and Patient Assistance Program Information  We have supplied [employer] with any of your insurance information on file for billing purposes. [employer] offers a sliding scale Patient Assistance Program for patients that do not have  insurance, or whose insurance does not completely cover the cost of the ZIO monitor.  You must apply for the Patient Assistance Program to qualify for this discounted rate.  To apply, please call [employer] at [phone], select option 4, select option 2, ask to apply for  Patient Assistance Program. Theodore Demark will ask your household income, and how many people  are in your household. They will quote your out-of-pocket cost based on that information.  [employer] will also be able to set up a 55-month, interest-free payment plan if needed.  Applying the monitor   Shave hair from upper left chest.  Hold abrader disc by orange tab. Rub abrader in 40 strokes over the upper left chest as  indicated in your monitor instructions.  Clean area with 4 enclosed alcohol pads. Let dry.  Apply patch as indicated in monitor instructions. Patch will be placed under collarbone on left  side of chest with arrow pointing upward.  Rub patch adhesive wings for 2 minutes. Remove white label marked "1". Remove the white  label marked "2". Rub patch  adhesive wings for 2 additional minutes.  While looking in a mirror, press and release button in center of patch. A small green light will  flash 3-4 times. This will be your only indicator that the monitor has been turned on.  Do not shower for the first 24 hours. You may shower after the first 24 hours.  Press the button if you feel a symptom. You will hear a small click. Record Date, Time and  Symptom in  the Patient Logbook.  When you are ready to remove the patch, follow instructions on the last 2 pages of Patient  Logbook. Stick patch monitor onto the last page of Patient Logbook.  Place Patient Logbook in the blue and white box. Use locking tab on box and tape box closed  securely. The blue and white box has prepaid postage on it. Please place it in the mailbox as  soon as possible. Your physician should have your test results approximately 7 days after the  monitor has been mailed back to [facility].  Call [place] at [phone] if you have questions regarding  your ZIO XT patch monitor. Call them immediately if you see an orange light blinking on your  monitor.  If your monitor falls off in less than 4 days, contact our Monitor department at [phone].  If your monitor becomes loose or falls off after 4 days call [employer] at [phone] for  suggestions on securing your monitor

## 2021-06-09 DIAGNOSIS — I10 Essential (primary) hypertension: Secondary | ICD-10-CM

## 2021-06-09 DIAGNOSIS — R002 Palpitations: Secondary | ICD-10-CM

## 2021-06-09 DIAGNOSIS — I498 Other specified cardiac arrhythmias: Secondary | ICD-10-CM

## 2021-06-09 DIAGNOSIS — I350 Nonrheumatic aortic (valve) stenosis: Secondary | ICD-10-CM

## 2021-06-24 DIAGNOSIS — R002 Palpitations: Secondary | ICD-10-CM | POA: Diagnosis not present

## 2021-06-24 DIAGNOSIS — I498 Other specified cardiac arrhythmias: Secondary | ICD-10-CM | POA: Diagnosis not present

## 2021-06-24 DIAGNOSIS — I350 Nonrheumatic aortic (valve) stenosis: Secondary | ICD-10-CM | POA: Diagnosis not present

## 2021-06-24 DIAGNOSIS — I10 Essential (primary) hypertension: Secondary | ICD-10-CM | POA: Diagnosis not present

## 2021-07-02 ENCOUNTER — Encounter: Payer: Self-pay | Admitting: Emergency Medicine

## 2021-07-02 ENCOUNTER — Ambulatory Visit (INDEPENDENT_AMBULATORY_CARE_PROVIDER_SITE_OTHER): Payer: PPO | Admitting: Emergency Medicine

## 2021-07-02 ENCOUNTER — Other Ambulatory Visit: Payer: Self-pay

## 2021-07-02 VITALS — BP 116/70 | HR 58 | Ht 65.0 in | Wt 208.0 lb

## 2021-07-02 DIAGNOSIS — I38 Endocarditis, valve unspecified: Secondary | ICD-10-CM | POA: Diagnosis not present

## 2021-07-02 DIAGNOSIS — Z23 Encounter for immunization: Secondary | ICD-10-CM | POA: Diagnosis not present

## 2021-07-02 DIAGNOSIS — Z0001 Encounter for general adult medical examination with abnormal findings: Secondary | ICD-10-CM | POA: Diagnosis not present

## 2021-07-02 DIAGNOSIS — J329 Chronic sinusitis, unspecified: Secondary | ICD-10-CM | POA: Diagnosis not present

## 2021-07-02 DIAGNOSIS — M159 Polyosteoarthritis, unspecified: Secondary | ICD-10-CM

## 2021-07-02 DIAGNOSIS — Z130 Encounter for screening for diseases of the blood and blood-forming organs and certain disorders involving the immune mechanism: Secondary | ICD-10-CM | POA: Diagnosis not present

## 2021-07-02 DIAGNOSIS — G4733 Obstructive sleep apnea (adult) (pediatric): Secondary | ICD-10-CM

## 2021-07-02 DIAGNOSIS — I10 Essential (primary) hypertension: Secondary | ICD-10-CM | POA: Diagnosis not present

## 2021-07-02 DIAGNOSIS — E785 Hyperlipidemia, unspecified: Secondary | ICD-10-CM

## 2021-07-02 LAB — COMPREHENSIVE METABOLIC PANEL
ALT: 20 U/L (ref 0–35)
AST: 18 U/L (ref 0–37)
Albumin: 3.8 g/dL (ref 3.5–5.2)
Alkaline Phosphatase: 41 U/L (ref 39–117)
BUN: 14 mg/dL (ref 6–23)
CO2: 33 mEq/L — ABNORMAL HIGH (ref 19–32)
Calcium: 9.5 mg/dL (ref 8.4–10.5)
Chloride: 100 mEq/L (ref 96–112)
Creatinine, Ser: 0.81 mg/dL (ref 0.40–1.20)
GFR: 73.54 mL/min (ref >60.00)
Glucose, Bld: 93 mg/dL (ref 70–99)
Potassium: 3.5 mEq/L (ref 3.5–5.1)
Sodium: 139 mEq/L (ref 135–145)
Total Bilirubin: 0.3 mg/dL (ref 0.2–1.2)
Total Protein: 7.1 g/dL (ref 6.0–8.3)

## 2021-07-02 LAB — LIPID PANEL
Cholesterol: 161 mg/dL (ref 0–200)
HDL: 60.9 mg/dL (ref >39.00)
LDL Cholesterol: 77 mg/dL (ref 0–99)
NonHDL: 99.69
Total CHOL/HDL Ratio: 3
Triglycerides: 112 mg/dL (ref 0.0–149.0)
VLDL: 22.4 mg/dL (ref 0.0–40.0)

## 2021-07-02 LAB — CBC WITH DIFFERENTIAL/PLATELET
Basophils Absolute: 0 10*3/uL (ref 0.0–0.1)
Basophils Relative: 0.8 % (ref 0.0–3.0)
Eosinophils Absolute: 0.1 10*3/uL (ref 0.0–0.7)
Eosinophils Relative: 1.5 % (ref 0.0–5.0)
HCT: 38.7 % (ref 36.0–46.0)
Hemoglobin: 13.2 g/dL (ref 12.0–15.0)
Lymphocytes Relative: 31.7 % (ref 12.0–46.0)
Lymphs Abs: 1.6 10*3/uL (ref 0.7–4.0)
MCHC: 34 g/dL (ref 30.0–36.0)
MCV: 88.9 fl (ref 78.0–100.0)
Monocytes Absolute: 0.5 10*3/uL (ref 0.1–1.0)
Monocytes Relative: 10.4 % (ref 3.0–12.0)
Neutro Abs: 2.7 10*3/uL (ref 1.4–7.7)
Neutrophils Relative %: 55.6 % (ref 43.0–77.0)
Platelets: 290 10*3/uL (ref 150.0–400.0)
RBC: 4.36 Mil/uL (ref 3.87–5.11)
RDW: 12.8 % (ref 11.5–15.5)
WBC: 4.9 10*3/uL (ref 4.0–10.5)

## 2021-07-02 LAB — HEMOGLOBIN A1C: Hgb A1c MFr Bld: 6 % (ref 4.6–6.5)

## 2021-07-02 NOTE — Progress Notes (Signed)
Lisa Miles 71 y.o.   Chief Complaint  Patient presents with   Annual Exam    Cough and congestion, x1 week    HISTORY OF PRESENT ILLNESS: This is a 71 y.o. female here for annual exam. Has had a cough and congestion for 1 week.  Slowly getting better. However she has a history of chronic congestion.  Requesting ENT referral. Has history of valvular heart disease and hypertension. History of dyslipidemia. History of sleep apnea. History of osteoarthritis and multiple places. No other complaints or medical concerns today.  HPI   Prior to Admission medications   Medication Sig Start Date End Date Taking? Authorizing Provider  amoxicillin (AMOXIL) 500 MG tablet Take 500 mg by mouth See admin instructions. Reports taking 4 capsules before dental procedures   Yes [provider]  aspirin EC 81 MG tablet Take 81 mg by mouth daily. Swallow whole.   Yes [provider]  chlorthalidone (HYGROTON) 25 MG tablet Take 1 tablet (25 mg total) by mouth daily. 10/25/20 07/02/21 Yes Leonie Man, MD  Cholecalciferol (VITAMIN D) 50 MCG (2000 UT) CAPS Take 2,000 Units by mouth daily.   Yes [provider]  esomeprazole (NEXIUM) 40 MG capsule TAKE 1 CAPSULE BY MOUTH TWICE DAILY BEFORE A MEAL 01/31/21  Yes Zehr, Janett Billow D, PA-C  estradiol (ESTRACE) 2 MG tablet Take 2 mg by mouth daily.   Yes [provider]  hydroxypropyl methylcellulose / hypromellose (ISOPTO TEARS / GONIOVISC) 2.5 % ophthalmic solution Place 1 drop into both eyes in the morning and at bedtime.   Yes [provider]  MODERNA COVID-19 BIVAL BOOSTER 50 MCG/0.5ML injection  02/25/21  Yes [provider]  rosuvastatin (CRESTOR) 10 MG tablet Take 1 tablet (10 mg total) by mouth daily. 02/20/21  Yes Ziare Cryder, Ines Bloomer, MD  traMADol (ULTRAM) 50 MG tablet Take 50 mg by mouth every 6 (six) hours as needed.   Yes [provider]  valsartan (DIOVAN) 320 MG tablet TAKE 1  TABLET(320 MG) BY MOUTH DAILY 06/02/21  Yes Horald Pollen, MD    No Known Allergies  Patient Active Problem List   Diagnosis Date Noted   Mild aortic stenosis 06/06/2021   Valvular heart disease 02/20/2021   OSA (obstructive sleep apnea) 11/02/2019   Non-restorative sleep 08/24/2019   Primary osteoarthritis involving multiple joints 04/01/2018   OAB (overactive bladder) 04/01/2018   Pounding heartbeat 10/19/2013   Thyromegaly 03/28/2013   Essential hypertension 07/14/2012   Dyslipidemia 05/30/2009   Moderate to severe mitral regurgitation 05/30/2009   ASTHMA UNSPECIFIED WITH EXACERBATION 04/02/2009   Heart murmur 04/02/2009    Past Medical History:  Diagnosis Date   Aortic stenosis, mild-moderate 07/2012   TTE October 21: Moderate aortic valve thickening with mild to moderate AI & mild AS; TEE: mild to moderate AI & MIld AS   Arthritis    Phreesia 11/06/2019   Asthma    Bilateral bunions    Bunionectomies performed   Bronchitis, chronic ([place])    Cataract    Phreesia 11/06/2019   Heart disease    Hyperlipidemia    Hypertension    Good control   Inguinodynia    Bilateral groin pain   Lower extremity edema    Chronic. Venous Duplex 11/06/11 SUMMARY: 1) Bilateral Lower Extremities: No evidence of DVT or thrombophlebitis.  2) Right Common Femoral Vein: Demonstrated mild valvular insufficiency with a greater than (1) sec of duration. Mildly abnormal LE Venous duplex Doppler.   Obesity  Palpitations    Relatively well controlled   Scoliosis    DG Chest 2 View x-ray on 07/30/11 by Dr. Everlene Farrier shows a scoliosis.   Severe mitral regurgitation by prior echocardiogram 07/2012   Mild AMVL prolapse, Mod MR -- no MVP noted in 01/2018 -> 11/'21: Severe MR due to restricted movement of P3 scallop of PMVL (IIIB) due to incomplete leaflet coaptation -> thickened leaflets consistent with Rheumatic Heart Valve Disease.    Past Surgical History:  Procedure Laterality Date   ABDOMINAL  HYSTERECTOMY     ANKLE ARTHROSCOPY Left    BUNIONECTOMY Bilateral    CARDIAC EVENT MONITOR  03/2020   (04/05/2020 -05/04/2020): Mostly NSR.  Heart rate range 42-148 bpm.  3 short bursts of 4-5 beats NSVT (asymptomatic).  116 beat run of PAT.  Rare PACs and PVCs.  Some bigeminy and trigeminy.   CARDIOPULMONARY EXERCISE TEST (CPX)  05/09/2020   (done with Ex St Echo): Normal Functional Capacity. No indication for CP limitations.  Body Habitus contributes to Exercise Intolerance.   EXERCISE STRESS ECHO  05/09/2020   TTE shows normal EF 65% with LVH No pericardial effusion Normal RV size and function Moderate appearing rheumatic MR/AR.;; -- NO COMMENT ON EXERCISE EFFECT ON MR!!! (even though this was the reason for ordering the test)   EYE SURGERY N/A    Phreesia 11/06/2019   KNEE ARTHROSCOPY Right    Left knee open surgery     pt thinks she only had shots in L knee, not surgery   RIGHT/LEFT HEART CATH AND CORONARY ANGIOGRAPHY N/A 03/28/2020   RIGHT/LEFT HEART CATH AND CORONARY ANGIOGRAPHY;  Leonie Man, MD;  Location: [facility] INVASIVE CV LAB; angiographically normal coronary arteries.  Cloacal LM. Normal RHC Pressures (PAP 40/13 - mean 22 mmHg, PCWP 68mHg, V wave 25 mmHg c/w MR, LVEDP 15 mmHg); CO/CI 5.02 / 2.45 (reduced).   TEE WITHOUT CARDIOVERSION N/A 03/13/2020   Procedure: TRANSESOPHAGEAL ECHOCARDIOGRAM (TEE);  Surgeon: OGeralynn Rile MD;  [facility];;; Severe MR 2/2 restricted P3 scallop of PMVL (IIIB) w/ incomplete leaflet coaptation.  Thickened /degenerative leaflets c/w Rheumatic Valvular Heart Disease.  2D PISA radius 0.9 cm with 2D ERO 0.26 cm2 with R Vol 62 cc. PV blunting w/ BP 109/60 mmHg. No MS. Mild-mod AI / Mild AS. Gr 2 plaque Aorta   TOTAL ABDOMINAL HYSTERECTOMY     TRANSTHORACIC ECHOCARDIOGRAM  01/2015; 01/2016   a) Normal LV size and function. GR 1 DD. Moderate LA Dilation. Calcified aortic valve with mild AS, moderate (2+) AI. Restricted posterior MV leaflet movement  w/ Mod MR. Mode TR with mod elevated PA pressures (45 mmHg);; b) Relatively stable. Moderate concentric LVH. Moderate AI with mild AS (Mean-PeaK Gradient: 19 / 36 mmHg). Mild MR. Mod LA dilation. PAP ~40 mmHg -> plan follow-up 2020.   TRANSTHORACIC ECHOCARDIOGRAM  01/2018   a) Normal LV size.  Mod Conc LVH.  EF 60-65%.  No R WMA.  GR 1 DD (high P).  Mild AS (MG 16 mmHg) w/ Mod AI.  Severe LA dilation.  Mild MR.; 01/2020:  EF 65-70%. No RWMA.  Severe MR w/ restricted PMVL movement.  (Recommend TEE).  Mod AoV thickening w/ mild to mod AS & Mod AI.  Nl RV fxn.  Mildly elevated filling P. Severe LA dilation.    Social History   Socioeconomic History   Marital status: Married    Spouse name: RHerbie [place]  Number of children: 2   Years of education: Not  on file   Highest education level: Not on file  Occupational History   Occupation: production    Employer: [employer]    Comment: check printing/shipping  Tobacco Use   Smoking status: Never   Smokeless tobacco: Never  Vaping Use   Vaping Use: Never used  Substance and Sexual Activity   Alcohol use: No   Drug use: No   Sexual activity: Never  Other Topics Concern   Not on file  Social History Narrative   Lives with her husband.  Their children live nearby.   Right handed   Caffeine: 1 cup/day   Social Determinants of Health   Financial Resource Strain: Not on file  Food Insecurity: Not on file  Transportation Needs: Not on file  Physical Activity: Not on file  Stress: Not on file  Social Connections: Not on file  Intimate Partner Violence: Not on file    Family History  Problem Relation Age of Onset   Arthritis Mother    Hyperlipidemia Mother    Diabetes Mother    Gout Mother    Hypertension Mother    Cancer Father        GI cancer   Diabetes Maternal Grandfather    Hypertension Brother    Diabetes Brother    Hypertension Brother    Migraines Neg Hx    Headache Neg Hx      Review of Systems  Constitutional:  Negative.  Negative for fever.  HENT:  Positive for congestion.   Respiratory:  Positive for cough. Negative for shortness of breath.   Cardiovascular: Negative.  Negative for chest pain and palpitations.  Gastrointestinal:  Negative for abdominal pain, blood in stool, diarrhea, melena, nausea and vomiting.  Musculoskeletal:  Positive for back pain.       Chronic right upper arm pain  Skin: Negative.  Negative for rash.  Neurological:  Positive for headaches (Chronic intermittent headaches).  All other systems reviewed and are negative.  Today's Vitals   07/02/21 1106  BP: 116/70  Pulse: (!) 58  SpO2: 98%  Weight: 208 lb (94.3 kg)  Height: '5\' 5"'$  (1.651 m)   Body mass index is 34.61 kg/m.  Physical Exam Vitals reviewed.  Constitutional:      Appearance: Normal appearance.  HENT:     Head: Normocephalic.     Right Ear: Tympanic membrane, ear canal and external ear normal.     Left Ear: Tympanic membrane, ear canal and external ear normal.     Mouth/Throat:     Mouth: Mucous membranes are moist.     Pharynx: Oropharynx is clear.  Eyes:     Extraocular Movements: Extraocular movements intact.     Conjunctiva/sclera: Conjunctivae normal.     Pupils: Pupils are equal, round, and reactive to light.  Cardiovascular:     Rate and Rhythm: Normal rate and regular rhythm.     Heart sounds: Murmur (Systolic 3/6 murmur) heard.  Pulmonary:     Effort: Pulmonary effort is normal.     Breath sounds: Normal breath sounds.  Abdominal:     General: Bowel sounds are normal. There is no distension.     Palpations: Abdomen is soft. There is no mass.     Tenderness: There is no abdominal tenderness.  Musculoskeletal:     Cervical back: No tenderness.     Right lower leg: No edema.     Left lower leg: No edema.     Comments: Feet: Warm to touch.  No erythema  or ecchymosis.  Good distal circulation.  Good distal sensation.  Lymphadenopathy:     Cervical: No cervical adenopathy.  Skin:     General: Skin is warm and dry.     Capillary Refill: Capillary refill takes less than 2 seconds.  Neurological:     General: No focal deficit present.     Mental Status: She is alert and oriented to [place], place, and time.  Psychiatric:        Mood and Affect: Mood normal.        Behavior: Behavior normal.     ASSESSMENT & PLAN: Problem List Items Addressed This Visit       Cardiovascular and Mediastinum   Essential hypertension (Chronic)   Relevant Orders   Comprehensive metabolic panel   Valvular heart disease     Respiratory   OSA (obstructive sleep apnea)     Musculoskeletal and Integument   Primary osteoarthritis involving multiple joints     Other   Dyslipidemia   Relevant Orders   Hemoglobin A1c   Lipid panel   Other Visit Diagnoses     Encounter for general adult medical examination with abnormal findings    -  Primary   Screening for deficiency anemia       Relevant Orders   CBC with Differential   Need for shingles vaccine       Chronic congestion of paranasal sinus       Relevant Orders   Ambulatory referral to ENT      Modifiable risk factors discussed with patient. Anticipatory guidance according to age provided. The following topics were also discussed: Social Determinants of Health Smoking.  Non-smoker Diet and nutrition Benefits of exercise Cancer screening and review of most recent mammogram and colonoscopy reports Vaccinations recommendations Cardiovascular chronic conditions review Mental health including depression and anxiety Fall and accident prevention Chronic congestion of sinuses and need for ENT evaluation  Patient Instructions  Health Maintenance, Female Adopting a healthy lifestyle and getting preventive care are important in promoting health and wellness. Ask your health care provider about: The right schedule for you to have regular tests and exams. Things you can do on your own to prevent diseases and keep yourself  healthy. What should I know about diet, weight, and exercise? Eat a healthy diet  Eat a diet that includes plenty of vegetables, fruits, low-fat dairy products, and lean protein. Do not eat a lot of foods that are high in solid fats, added sugars, or sodium. Maintain a healthy weight Body mass index (BMI) is used to identify weight problems. It estimates body fat based on height and weight. Your health care provider can help determine your BMI and help you achieve or maintain a healthy weight. Get regular exercise Get regular exercise. This is one of the most important things you can do for your health. Most adults should: Exercise for at least 150 minutes each week. The exercise should increase your heart rate and make you sweat (moderate-intensity exercise). Do strengthening exercises at least twice a week. This is in addition to the moderate-intensity exercise. Spend less time sitting. Even light physical activity can be beneficial. Watch cholesterol and blood lipids Have your blood tested for lipids and cholesterol at 71 years of age, then have this test every 5 years. Have your cholesterol levels checked more often if: Your lipid or cholesterol levels are high. You are older than 71 years of age. You are at high risk for heart disease. What should I know  about cancer screening? Depending on your health history and family history, you may need to have cancer screening at various ages. This may include screening for: Breast cancer. Cervical cancer. Colorectal cancer. Skin cancer. Lung cancer. What should I know about heart disease, diabetes, and high blood pressure? Blood pressure and heart disease High blood pressure causes heart disease and increases the risk of stroke. This is more likely to develop in people who have high blood pressure readings or are overweight. Have your blood pressure checked: Every 3-5 years if you are 3-47 years of age. Every year if you are 46 years old  or older. Diabetes Have regular diabetes screenings. This checks your fasting blood sugar level. Have the screening done: Once every three years after age 69 if you are at a normal weight and have a low risk for diabetes. More often and at a younger age if you are overweight or have a high risk for diabetes. What should I know about preventing infection? Hepatitis B If you have a higher risk for hepatitis B, you should be screened for this virus. Talk with your health care provider to find out if you are at risk for hepatitis B infection. Hepatitis C Testing is recommended for: Everyone born from [birth year] through 1965. Anyone with known risk factors for hepatitis C. Sexually transmitted infections (STIs) Get screened for STIs, including gonorrhea and chlamydia, if: You are sexually active and are younger than 71 years of age. You are older than 71 years of age and your health care provider tells you that you are at risk for this type of infection. Your sexual activity has changed since you were last screened, and you are at increased risk for chlamydia or gonorrhea. Ask your health care provider if you are at risk. Ask your health care provider about whether you are at high risk for HIV. Your health care provider may recommend a prescription medicine to help prevent HIV infection. If you choose to take medicine to prevent HIV, you should first get tested for HIV. You should then be tested every 3 months for as long as you are taking the medicine. Pregnancy If you are about to stop having your period (premenopausal) and you may become pregnant, seek counseling before you get pregnant. Take 400 to 800 micrograms (mcg) of folic acid every day if you become pregnant. Ask for birth control (contraception) if you want to prevent pregnancy. Osteoporosis and menopause Osteoporosis is a disease in which the bones lose minerals and strength with aging. This can result in bone fractures. If you are 39 years  old or older, or if you are at risk for osteoporosis and fractures, ask your health care provider if you should: Be screened for bone loss. Take a calcium or vitamin D supplement to lower your risk of fractures. Be given hormone replacement therapy (HRT) to treat symptoms of menopause. Follow these instructions at home: Alcohol use Do not drink alcohol if: Your health care provider tells you not to drink. You are pregnant, may be pregnant, or are planning to become pregnant. If you drink alcohol: Limit how much you have to: 0-1 drink a day. Know how much alcohol is in your drink. In the [place] one drink equals one 12 oz bottle of beer (355 mL), one 5 oz glass of wine (148 mL), or one 1 oz glass of hard liquor (44 mL). Lifestyle Do not use any products that contain nicotine or tobacco. These products include cigarettes, chewing tobacco, and vaping  devices, such as e-cigarettes. If you need help quitting, ask your health care provider. Do not use street drugs. Do not share needles. Ask your health care provider for help if you need support or information about quitting drugs. General instructions Schedule regular health, dental, and eye exams. Stay current with your vaccines. Tell your health care provider if: You often feel depressed. You have ever been abused or do not feel safe at home. Summary Adopting a healthy lifestyle and getting preventive care are important in promoting health and wellness. Follow your health care provider's instructions about healthy diet, exercising, and getting tested or screened for diseases. Follow your health care provider's instructions on monitoring your cholesterol and blood pressure. This information is not intended to replace advice given to you by your health care provider. Make sure you discuss any questions you have with your health care provider. Document Revised: 09/02/2020 Document Reviewed: 09/02/2020 Elsevier Patient Education  2022 [place], [place] [facility]

## 2021-07-02 NOTE — Patient Instructions (Signed)

## 2021-07-06 ENCOUNTER — Encounter: Payer: Self-pay | Admitting: Emergency Medicine

## 2021-07-19 ENCOUNTER — Encounter: Payer: Self-pay | Admitting: Cardiology

## 2021-07-19 DIAGNOSIS — I491 Atrial premature depolarization: Secondary | ICD-10-CM | POA: Insufficient documentation

## 2021-07-19 DIAGNOSIS — I498 Other specified cardiac arrhythmias: Secondary | ICD-10-CM | POA: Insufficient documentation

## 2021-07-19 NOTE — Assessment & Plan Note (Signed)
Stable findings on recent echocardiogram.  She remains relatively asymptomatic although now she is feeling this fluttering sensation which is somewhat concerning.  I worry that she may be developing atrial fibrillation. ? ?Would be due for follow-up echocardiogram in January 2024.  However with palpitations we will get a check Zio patch monitor. ?

## 2021-07-19 NOTE — Assessment & Plan Note (Signed)
She has some pounding sensations and some fluttering sensations. ? ?Concern for potential arrhythmia: Check 7-day Zio patch monitor ?

## 2021-07-19 NOTE — Assessment & Plan Note (Signed)
Blood pressure pretty well controlled.  He remains on chlorthalidone and valsartan. ?

## 2021-07-19 NOTE — Assessment & Plan Note (Signed)
Pounding heart rate: Concerned that she may be potentially developing atrial fibrillation. ? ?Plan: Check 7-day Zio patch monitor ?

## 2021-07-19 NOTE — Assessment & Plan Note (Signed)
Murmur heard on exam.  Following progression of the aortic valve disease along with mitral valve disease.  Plan echo in January 2024. ?

## 2021-08-21 DIAGNOSIS — K2289 Other specified disease of esophagus: Secondary | ICD-10-CM | POA: Diagnosis not present

## 2021-08-21 DIAGNOSIS — J3489 Other specified disorders of nose and nasal sinuses: Secondary | ICD-10-CM | POA: Diagnosis not present

## 2021-08-21 DIAGNOSIS — J342 Deviated nasal septum: Secondary | ICD-10-CM | POA: Diagnosis not present

## 2021-08-27 ENCOUNTER — Ambulatory Visit: Payer: PPO | Admitting: Emergency Medicine

## 2021-09-02 DIAGNOSIS — Z79890 Hormone replacement therapy (postmenopausal): Secondary | ICD-10-CM | POA: Diagnosis not present

## 2021-09-02 DIAGNOSIS — Z01419 Encounter for gynecological examination (general) (routine) without abnormal findings: Secondary | ICD-10-CM | POA: Diagnosis not present

## 2021-09-02 DIAGNOSIS — Z6835 Body mass index (BMI) 35.0-35.9, adult: Secondary | ICD-10-CM | POA: Diagnosis not present

## 2021-09-12 ENCOUNTER — Ambulatory Visit (INDEPENDENT_AMBULATORY_CARE_PROVIDER_SITE_OTHER): Payer: PPO

## 2021-09-12 DIAGNOSIS — Z0000 Encounter for general adult medical examination without abnormal findings: Secondary | ICD-10-CM

## 2021-09-12 NOTE — Patient Instructions (Signed)
Lisa Miles , Thank you for taking time to come for your Medicare Wellness Visit. I appreciate your ongoing commitment to your health goals. Please review the following plan we discussed and let me know if I can assist you in the future.   Screening recommendations/referrals: Colonoscopy: 02/10/2017 Mammogram: 09/27/2020 Bone Density: declined  Recommended yearly ophthalmology/optometry visit for glaucoma screening and checkup Recommended yearly dental visit for hygiene and checkup  Vaccinations: Influenza vaccine: completed  Pneumococcal vaccine: completed  Tdap vaccine: 03/28/2013 Shingles vaccine: will consider     Advanced directives: yes   Conditions/risks identified: none   Next appointment: none    Preventive Care 48 Years and Older, Female Preventive care refers to lifestyle choices and visits with your health care provider that can promote health and wellness. What does preventive care include? A yearly physical exam. This is also called an annual well check. Dental exams once or twice a year. Routine eye exams. Ask your health care provider how often you should have your eyes checked. Personal lifestyle choices, including: Daily care of your teeth and gums. Regular physical activity. Eating a healthy diet. Avoiding tobacco and drug use. Limiting alcohol use. Practicing safe sex. Taking low-dose aspirin every day. Taking vitamin and mineral supplements as recommended by your health care provider. What happens during an annual well check? The services and screenings done by your health care provider during your annual well check will depend on your age, overall health, lifestyle risk factors, and family history of disease. Counseling  Your health care provider may ask you questions about your: Alcohol use. Tobacco use. Drug use. Emotional well-being. Home and relationship well-being. Sexual activity. Eating habits. History of falls. Memory and ability to  understand (cognition). Work and work Statistician. Reproductive health. Screening  You may have the following tests or measurements: Height, weight, and BMI. Blood pressure. Lipid and cholesterol levels. These may be checked every 5 years, or more frequently if you are over 80 years old. Skin check. Lung cancer screening. You may have this screening every year starting at age 15 if you have a 30-pack-year history of smoking and currently smoke or have quit within the past 15 years. Fecal occult blood test (FOBT) of the stool. You may have this test every year starting at age 38. Flexible sigmoidoscopy or colonoscopy. You may have a sigmoidoscopy every 5 years or a colonoscopy every 10 years starting at age 69. Hepatitis C blood test. Hepatitis B blood test. Sexually transmitted disease (STD) testing. Diabetes screening. This is done by checking your blood sugar (glucose) after you have not eaten for a while (fasting). You may have this done every 1-3 years. Bone density scan. This is done to screen for osteoporosis. You may have this done starting at age 22. Mammogram. This may be done every 1-2 years. Talk to your health care provider about how often you should have regular mammograms. Talk with your health care provider about your test results, treatment options, and if necessary, the need for more tests. Vaccines  Your health care provider may recommend certain vaccines, such as: Influenza vaccine. This is recommended every year. Tetanus, diphtheria, and acellular pertussis (Tdap, Td) vaccine. You may need a Td booster every 10 years. Zoster vaccine. You may need this after age 33. Pneumococcal 13-valent conjugate (PCV13) vaccine. One dose is recommended after age 77. Pneumococcal polysaccharide (PPSV23) vaccine. One dose is recommended after age 69. Talk to your health care provider about which screenings and vaccines you need and how  often you need them. This information is not  intended to replace advice given to you by your health care provider. Make sure you discuss any questions you have with your health care provider. Document Released: 05/10/2015 Document Revised: 01/01/2016 Document Reviewed: 02/12/2015 Elsevier Interactive Patient Education  2017 [place] Prevention in the Home Falls can cause injuries. They can happen to people of all ages. There are many things you can do to make your home safe and to help prevent falls. What can I do on the outside of my home? Regularly fix the edges of walkways and driveways and fix any cracks. Remove anything that might make you trip as you walk through a door, such as a raised step or threshold. Trim any bushes or trees on the path to your home. Use bright outdoor lighting. Clear any walking paths of anything that might make someone trip, such as rocks or tools. Regularly check to see if handrails are loose or broken. Make sure that both sides of any steps have handrails. Any raised decks and porches should have guardrails on the edges. Have any leaves, snow, or ice cleared regularly. Use sand or salt on walking paths during winter. Clean up any spills in your garage right away. This includes oil or grease spills. What can I do in the bathroom? Use night lights. Install grab bars by the toilet and in the tub and shower. Do not use towel bars as grab bars. Use non-skid mats or decals in the tub or shower. If you need to sit down in the shower, use a plastic, non-slip stool. Keep the floor dry. Clean up any water that spills on the floor as soon as it happens. Remove soap buildup in the tub or shower regularly. Attach bath mats securely with double-sided non-slip rug tape. Do not have throw rugs and other things on the floor that can make you trip. What can I do in the bedroom? Use night lights. Make sure that you have a light by your bed that is easy to reach. Do not use any sheets or blankets that are  too big for your bed. They should not hang down onto the floor. Have a firm chair that has side arms. You can use this for support while you get dressed. Do not have throw rugs and other things on the floor that can make you trip. What can I do in the kitchen? Clean up any spills right away. Avoid walking on wet floors. Keep items that you use a lot in easy-to-reach places. If you need to reach something above you, use a strong step stool that has a grab bar. Keep electrical cords out of the way. Do not use floor polish or wax that makes floors slippery. If you must use wax, use non-skid floor wax. Do not have throw rugs and other things on the floor that can make you trip. What can I do with my stairs? Do not leave any items on the stairs. Make sure that there are handrails on both sides of the stairs and use them. Fix handrails that are broken or loose. Make sure that handrails are as long as the stairways. Check any carpeting to make sure that it is firmly attached to the stairs. Fix any carpet that is loose or worn. Avoid having throw rugs at the top or bottom of the stairs. If you do have throw rugs, attach them to the floor with carpet tape. Make sure that you have a  light switch at the top of the stairs and the bottom of the stairs. If you do not have them, ask someone to add them for you. What else can I do to help prevent falls? Wear shoes that: Do not have high heels. Have rubber bottoms. Are comfortable and fit you well. Are closed at the toe. Do not wear sandals. If you use a stepladder: Make sure that it is fully opened. Do not climb a closed stepladder. Make sure that both sides of the stepladder are locked into place. Ask someone to hold it for you, if possible. Clearly mark and make sure that you can see: Any grab bars or handrails. First and last steps. Where the edge of each step is. Use tools that help you move around (mobility aids) if they are needed. These  include: Canes. Walkers. Scooters. Crutches. Turn on the lights when you go into a dark area. Replace any light bulbs as soon as they burn out. Set up your furniture so you have a clear path. Avoid moving your furniture around. If any of your floors are uneven, fix them. If there are any pets around you, be aware of where they are. Review your medicines with your doctor. Some medicines can make you feel dizzy. This can increase your chance of falling. Ask your doctor what other things that you can do to help prevent falls. This information is not intended to replace advice given to you by your health care provider. Make sure you discuss any questions you have with your health care provider. Document Released: 02/07/2009 Document Revised: 09/19/2015 Document Reviewed: 05/18/2014 Elsevier Interactive Patient Education  2017 [employer].

## 2021-09-12 NOTE — Progress Notes (Signed)
Subjective:   Izzah Pasqua Plancarte is a 71 y.o. female who presents for Medicare Annual (Subsequent) preventive examination.   I connected with Coline Hurston today by telephone and verified that I am speaking with the correct Ratajczak using two identifiers. Location patient: home Location provider: work Persons participating in the virtual visit: patient, provider.   I discussed the limitations, risks, security and privacy concerns of performing an evaluation and management service by telephone and the availability of in [place] appointments. I also discussed with the patient that there may be a patient responsible charge related to this service. The patient expressed understanding and verbally consented to this telephonic visit.    Interactive audio and video telecommunications were attempted between this provider and patient, however failed, due to patient having technical difficulties OR patient did not have access to video capability.  We continued and completed visit with audio only.    Review of Systems     Cardiac Risk Factors include: advanced age (>22mn, >>63women)     Objective:    Today's Vitals   There is no height or weight on file to calculate BMI.     09/12/2021    2:12 PM 03/28/2020    6:02 AM 03/13/2020    8:19 AM 11/08/2019   11:08 AM 11/07/2018    1:12 PM 10/17/2014   12:29 AM 03/29/2014    5:49 PM  Advanced Directives  Does Patient Have a Medical Advance Directive? Yes Yes Yes No No No No  Type of AParamedicof AMaxeysLiving will Living will Living will      Does patient want to make changes to medical advance directive?  No - Patient declined       Copy of HJim Wellsin Chart? No - copy requested        Would patient like information on creating a medical advance directive?    Yes (ED - Information included in AVS) No - Patient declined No - patient declined information No - patient declined information    Current  Medications (verified) Outpatient Encounter Medications as of 09/12/2021  Medication Sig   amoxicillin (AMOXIL) 500 MG tablet Take 500 mg by mouth See admin instructions. Reports taking 4 capsules before dental procedures   aspirin EC 81 MG tablet Take 81 mg by mouth daily. Swallow whole.   Cholecalciferol (VITAMIN D) 50 MCG (2000 UT) CAPS Take 2,000 Units by mouth daily.   esomeprazole (NEXIUM) 40 MG capsule TAKE 1 CAPSULE BY MOUTH TWICE DAILY BEFORE A MEAL   hydroxypropyl methylcellulose / hypromellose (ISOPTO TEARS / GONIOVISC) 2.5 % ophthalmic solution Place 1 drop into both eyes in the morning and at bedtime.   MODERNA COVID-19 BIVAL BOOSTER 50 MCG/0.5ML injection    rosuvastatin (CRESTOR) 10 MG tablet Take 1 tablet (10 mg total) by mouth daily.   traMADol (ULTRAM) 50 MG tablet Take 50 mg by mouth every 6 (six) hours as needed.   valsartan (DIOVAN) 320 MG tablet TAKE 1 TABLET(320 MG) BY MOUTH DAILY   chlorthalidone (HYGROTON) 25 MG tablet Take 1 tablet (25 mg total) by mouth daily.   estradiol (ESTRACE) 2 MG tablet Take 2 mg by mouth daily. (Patient not taking: Reported on 09/12/2021)   No facility-administered encounter medications on file as of 09/12/2021.    Allergies (verified) Patient has no known allergies.   History: Past Medical History:  Diagnosis Date   Aortic stenosis, mild-moderate 07/2012   TTE October 21: Moderate aortic valve  thickening with mild to moderate AI & mild AS; TEE: mild to moderate AI & MIld AS   Arthritis    Phreesia 11/06/2019   Asthma    Bilateral bunions    Bunionectomies performed   Bronchitis, chronic ([place])    Cataract    Phreesia 11/06/2019   Heart disease    Hyperlipidemia    Hypertension    Good control   Inguinodynia    Bilateral groin pain   Lower extremity edema    Chronic. Venous Duplex 11/06/11 SUMMARY: 1) Bilateral Lower Extremities: No evidence of DVT or thrombophlebitis.  2) Right Common Femoral Vein: Demonstrated mild valvular  insufficiency with a greater than (1) sec of duration. Mildly abnormal LE Venous duplex Doppler.   Obesity    Palpitations    Relatively well controlled   Scoliosis    DG Chest 2 View x-ray on 07/30/11 by Dr. Everlene Farrier shows a scoliosis.   Severe mitral regurgitation by prior echocardiogram 07/2012   Mild AMVL prolapse, Mod MR -- no MVP noted in 01/2018 -> 11/'21: Severe MR due to restricted movement of P3 scallop of PMVL (IIIB) due to incomplete leaflet coaptation -> thickened leaflets consistent with Rheumatic Heart Valve Disease.   Past Surgical History:  Procedure Laterality Date   ABDOMINAL HYSTERECTOMY     ANKLE ARTHROSCOPY Left    BUNIONECTOMY Bilateral    CARDIAC EVENT MONITOR  03/2020   (04/05/2020 -05/04/2020): Mostly NSR.  Heart rate range 42-148 bpm.  3 short bursts of 4-5 beats NSVT (asymptomatic).  116 beat run of PAT.  Rare PACs and PVCs.  Some bigeminy and trigeminy.   CARDIOPULMONARY EXERCISE TEST (CPX)  05/09/2020   (done with Ex St Echo): Normal Functional Capacity. No indication for CP limitations.  Body Habitus contributes to Exercise Intolerance.   EXERCISE STRESS ECHO  05/09/2020   TTE shows normal EF 65% with LVH No pericardial effusion Normal RV size and function Moderate appearing rheumatic MR/AR.;; -- NO COMMENT ON EXERCISE EFFECT ON MR!!! (even though this was the reason for ordering the test)   EYE SURGERY N/A    Phreesia 11/06/2019   KNEE ARTHROSCOPY Right    Left knee open surgery     pt thinks she only had shots in L knee, not surgery   RIGHT/LEFT HEART CATH AND CORONARY ANGIOGRAPHY N/A 03/28/2020   RIGHT/LEFT HEART CATH AND CORONARY ANGIOGRAPHY;  Leonie Man, MD;  Location: [facility] INVASIVE CV LAB; angiographically normal coronary arteries.  Cloacal LM. Normal RHC Pressures (PAP 40/13 - mean 22 mmHg, PCWP 62mHg, V wave 25 mmHg c/w MR, LVEDP 15 mmHg); CO/CI 5.02 / 2.45 (reduced).   TEE WITHOUT CARDIOVERSION N/A 03/13/2020   Procedure: TRANSESOPHAGEAL ECHOCARDIOGRAM  (TEE);  Surgeon: OGeralynn Rile MD;  [facility];;; Severe MR 2/2 restricted P3 scallop of PMVL (IIIB) w/ incomplete leaflet coaptation.  Thickened /degenerative leaflets c/w Rheumatic Valvular Heart Disease.  2D PISA radius 0.9 cm with 2D ERO 0.26 cm2 with R Vol 62 cc. PV blunting w/ BP 109/60 mmHg. No MS. Mild-mod AI / Mild AS. Gr 2 plaque Aorta   TOTAL ABDOMINAL HYSTERECTOMY     TRANSTHORACIC ECHOCARDIOGRAM  05/09/2021   EF 60 to 65%.  No RWMA.  GR 1 DD with elevated LAP-moderately dilated left atrium (LA).  Normal RV size and function.  Moderate to severe MR with no MS.  Moderate aortic calcification/sclerosis with only mild stenosis.  Ascending aorta 39 mm.   TRANSTHORACIC ECHOCARDIOGRAM  01/2018   a) Normal  LV size.  Mod Conc LVH.  EF 60-65%.  No R WMA.  GR 1 DD (high P).  Mild AS (MG 16 mmHg) w/ Mod AI.  Severe LA dilation.  Mild MR.; 01/2020:  EF 65-70%. No RWMA.  Severe MR w/ restricted PMVL movement.  (Recommend TEE).  Mod AoV thickening w/ mild to mod AS & Mod AI.  Nl RV fxn.  Mildly elevated filling P. Severe LA dilation.   Family History  Problem Relation Age of Onset   Arthritis Mother    Hyperlipidemia Mother    Diabetes Mother    Gout Mother    Hypertension Mother    Cancer Father        GI cancer   Diabetes Maternal Grandfather    Hypertension Brother    Diabetes Brother    Hypertension Brother    Migraines Neg Hx    Headache Neg Hx    Social History   Socioeconomic History   Marital status: Married    Spouse name: Herbie [place]   Number of children: 2   Years of education: Not on file   Highest education level: Not on file  Occupational History   Occupation: production    Employer: Management consultant    Comment: check printing/shipping  Tobacco Use   Smoking status: Never   Smokeless tobacco: Never  Vaping Use   Vaping Use: Never used  Substance and Sexual Activity   Alcohol use: No   Drug use: No   Sexual activity: Never  Other Topics Concern   Not on  file  Social History Narrative   Lives with her husband.  Their children live nearby.   Right handed   Caffeine: 1 cup/day   Social Determinants of Health   Financial Resource Strain: Low Risk    Difficulty of Paying Living Expenses: Not hard at all  Food Insecurity: No Food Insecurity   Worried About Charity fundraiser in the Last Year: Never true   Ran Out of Food in the Last Year: Never true  Transportation Needs: No Transportation Needs   Lack of Transportation (Medical): No   Lack of Transportation (Non-Medical): No  Physical Activity: Insufficiently Active   Days of Exercise per Week: 3 days   Minutes of Exercise per Session: 20 min  Stress: No Stress Concern Present   Feeling of Stress : Not at all  Social Connections: Moderately Isolated   Frequency of Communication with Friends and Family: Three times a week   Frequency of Social Gatherings with Friends and Family: Three times a week   Attends Religious Services: Never   Active Member of Clubs or Organizations: No   Attends Music therapist: Never   Marital Status: Married    Tobacco Counseling Counseling given: Not Answered   Clinical Intake:  Pre-visit preparation completed: Yes  Pain : No/denies pain     Nutritional Risks: None Diabetes: No  How often do you need to have someone help you when you read instructions, pamphlets, or other written materials from your doctor or pharmacy?: 1 - Never What is the last grade level you completed in school?: college  Diabetic?no   Interpreter Needed?: No  Information entered by :: [place] of Daily Living    09/12/2021    2:13 PM  In your present state of health, do you have any difficulty performing the following activities:  Hearing? 0  Vision? 0  Difficulty concentrating or making decisions? 0  Walking or climbing stairs?  0  Dressing or bathing? 0  Doing errands, shopping? 0  Preparing Food and eating ? N  Using the  Toilet? N  In the past six months, have you accidently leaked urine? N  Do you have problems with loss of bowel control? N  Managing your Medications? N  Managing your Finances? N  Housekeeping or managing your Housekeeping? N    Patient Care Team: Horald Pollen, MD as PCP - General (Internal Medicine) Leonie Man, MD as PCP - Cardiology (Cardiology) Leonie Man, MD as Consulting Physician (Cardiology) Maisie Fus, MD as Consulting Physician (Obstetrics and Gynecology)  Indicate any recent Medical Services you may have received from other than [facility] providers in the past year (date may be approximate).     Assessment:   This is a routine wellness examination for [place].  Hearing/Vision screen Vision Screening - Comments:: Annual eye exams wears glasses   Dietary issues and exercise activities discussed: Current Exercise Habits: Home exercise routine, Type of exercise: walking, Time (Minutes): 20, Frequency (Times/Week): 3, Weekly Exercise (Minutes/Week): 60, Intensity: Mild, Exercise limited by: None identified   Goals Addressed   None    Depression Screen    09/12/2021    2:13 PM 09/12/2021    2:11 PM 09/12/2021    2:10 PM 07/02/2021   11:11 AM 02/20/2021    2:40 PM 03/11/2020   10:06 AM 11/08/2019   11:06 AM  PHQ 2/9 Scores  PHQ - 2 Score 0 0 0 0 0 0 0    Fall Risk    09/12/2021    2:14 PM 07/02/2021   11:11 AM 02/20/2021    2:40 PM 03/11/2020   10:06 AM 11/08/2019   11:06 AM  [place] in the past year? 0 0 0 0 0  Number falls in past yr: 0 0 0 0 0  Injury with Fall? 0 0 0 0 0  Follow up Falls evaluation completed   Falls evaluation completed Falls evaluation completed;Education provided    FALL RISK PREVENTION PERTAINING TO THE HOME:  Any stairs in or around the home? No  If so, are there any without handrails? No  Home free of loose throw rugs in walkways, pet beds, electrical cords, etc? Yes  Adequate lighting in your home to  reduce risk of falls? Yes   ASSISTIVE DEVICES UTILIZED TO PREVENT FALLS:  Life alert? No  Use of a cane, walker or w/c? No  Grab bars in the bathroom? No  Shower chair or bench in shower? No  Elevated toilet seat or a handicapped toilet? No    Cognitive Function:  Normal cognitive status assessed by telephone conversation  by this Nurse Health Advisor. No abnormalities found.        11/08/2019   11:04 AM 11/07/2018    1:22 PM 11/07/2018    1:20 PM  6CIT Screen  What Year? 0 points 0 points 0 points  What month? 0 points 0 points 0 points  What time? 0 points 0 points 0 points  Count back from 20 0 points 0 points 0 points  Months in reverse 0 points 0 points 0 points  Repeat phrase 0 points 0 points   Total Score 0 points 0 points     Immunizations Immunization History  Administered Date(s) Administered   Fluad Quad(high Dose 65+) 01/06/2019, 02/20/2021   Influenza Whole 03/27/2009   Influenza, High Dose Seasonal PF 04/01/2018   Influenza,inj,Quad PF,6+ Mos 03/15/2012, 03/28/2013, 07/22/2016  Moderna Sars-Covid-2 Vaccination 06/10/2019, 07/08/2019, 02/27/2020, 09/02/2020   Pneumococcal Conjugate-13 07/22/2016   Pneumococcal Polysaccharide-23 04/02/2009, 09/22/2018   Tdap 03/28/2013    TDAP status: Up to date  Flu Vaccine status: Up to date  Pneumococcal vaccine status: Up to date  Covid-19 vaccine status: Completed vaccines  Qualifies for Shingles Vaccine? Yes   Zostavax completed No   Shingrix Completed?: No.    Education has been provided regarding the importance of this vaccine. Patient has been advised to call insurance company to determine out of pocket expense if they have not yet received this vaccine. Advised may also receive vaccine at local pharmacy or Health Dept. Verbalized acceptance and understanding.  Screening Tests Health Maintenance  Topic Date Due   Zoster Vaccines- Shingrix (1 of 2) Never done   COVID-19 Vaccine (5 - Booster for Moderna  series) 10/28/2020   MAMMOGRAM  09/27/2021   INFLUENZA VACCINE  11/25/2021   TETANUS/TDAP  03/29/2023   COLONOSCOPY (Pts 45-68yr Insurance coverage will need to be confirmed)  02/11/2027   Pneumonia Vaccine 71 Years old  Completed   DEXA SCAN  Completed   Hepatitis C Screening  Completed   HPV VACCINES  Aged Out    Health Maintenance  Health Maintenance Due  Topic Date Due   Zoster Vaccines- Shingrix (1 of 2) Never done   COVID-19 Vaccine (5 - Booster for Moderna series) 10/28/2020    Colorectal cancer screening: Type of screening: Colonoscopy. Completed 02/10/2017. Repeat every 10 years  Mammogram status: Completed 09/27/2020. Repeat every year  Bone Density status: Ordered Patient declined . Pt provided with contact info and advised to call to schedule appt.  Lung Cancer Screening: (Low Dose CT Chest recommended if Age 71-80years, 30 pack-year currently smoking OR have quit w/in 15years.) does not qualify.   Lung Cancer Screening Referral: n/a  Additional Screening:  Hepatitis C Screening: does not qualify; Completed 07/22/2016  Vision Screening: Recommended annual ophthalmology exams for early detection of glaucoma and other disorders of the eye. Is the patient up to date with their annual eye exam?  Yes  Who is the provider or what is the name of the office in which the patient attends annual eye exams? [facility]  If pt is not established with a provider, would they like to be referred to a provider to establish care? No .   Dental Screening: Recommended annual dental exams for proper oral hygiene  Community Resource Referral / Chronic Care Management: CRR required this visit?  No   CCM required this visit?  No      Plan:     I have personally reviewed and noted the following in the patient's chart:   Medical and social history Use of alcohol, tobacco or illicit drugs  Current medications and supplements including opioid prescriptions.  Functional  ability and status Nutritional status Physical activity Advanced directives List of other physicians Hospitalizations, surgeries, and ER visits in previous 12 months Vitals Screenings to include cognitive, depression, and falls Referrals and appointments  In addition, I have reviewed and discussed with patient certain preventive protocols, quality metrics, and best practice recommendations. A written personalized care plan for preventive services as well as general preventive health recommendations were provided to patient.     LRandel Pigg LPN   [id number]  Nurse Notes: none

## 2021-09-24 ENCOUNTER — Other Ambulatory Visit: Payer: Self-pay | Admitting: Gastroenterology

## 2021-09-30 DIAGNOSIS — Z1231 Encounter for screening mammogram for malignant neoplasm of breast: Secondary | ICD-10-CM | POA: Diagnosis not present

## 2021-10-02 DIAGNOSIS — J342 Deviated nasal septum: Secondary | ICD-10-CM | POA: Diagnosis not present

## 2021-10-02 DIAGNOSIS — R0989 Other specified symptoms and signs involving the circulatory and respiratory systems: Secondary | ICD-10-CM | POA: Diagnosis not present

## 2021-10-13 ENCOUNTER — Other Ambulatory Visit: Payer: Self-pay | Admitting: Cardiology

## 2021-10-20 ENCOUNTER — Telehealth: Payer: Self-pay

## 2021-10-20 ENCOUNTER — Telehealth: Payer: Self-pay | Admitting: Cardiology

## 2021-10-20 NOTE — Telephone Encounter (Signed)
Pt is calling to see if Nuriva OTC memory medication is safe for her to take?  Pt CB [phone]

## 2021-10-20 NOTE — Telephone Encounter (Signed)
*  STAT* If patient is at the pharmacy, call can be transferred to refill team.   1. Which medications need to be refilled? (please list name of each medication and dose if known)   chlorthalidone (HYGROTON) 25 MG tablet    2. Which pharmacy/location (including street and city if local pharmacy) is medication to be sent to? [id number] - [place], [place] - [address] AT [facility] OF [address]  3. Do they need a 30 day or 90 day supply?  30 day  Pt has appt on 12/23/21 and fist attempt to pharmacy for refill failed on 6/19. Please advise

## 2021-10-21 MED ORDER — CHLORTHALIDONE 25 MG PO TABS: ORAL_TABLET | ORAL | 2 refills | Status: DC

## 2021-11-07 DIAGNOSIS — Z23 Encounter for immunization: Secondary | ICD-10-CM | POA: Diagnosis not present

## 2021-11-12 ENCOUNTER — Other Ambulatory Visit: Payer: Self-pay | Admitting: Cardiology

## 2021-11-12 ENCOUNTER — Other Ambulatory Visit: Payer: Self-pay | Admitting: Emergency Medicine

## 2021-11-12 DIAGNOSIS — I10 Essential (primary) hypertension: Secondary | ICD-10-CM

## 2021-12-23 ENCOUNTER — Encounter: Payer: Self-pay | Admitting: Cardiology

## 2021-12-23 ENCOUNTER — Ambulatory Visit: Payer: PPO | Attending: Cardiology | Admitting: Cardiology

## 2021-12-23 VITALS — BP 122/80 | HR 97 | Ht 66.0 in | Wt 206.4 lb

## 2021-12-23 DIAGNOSIS — I38 Endocarditis, valve unspecified: Secondary | ICD-10-CM | POA: Diagnosis not present

## 2021-12-23 DIAGNOSIS — I340 Nonrheumatic mitral (valve) insufficiency: Secondary | ICD-10-CM

## 2021-12-23 DIAGNOSIS — I10 Essential (primary) hypertension: Secondary | ICD-10-CM | POA: Diagnosis not present

## 2021-12-23 DIAGNOSIS — I491 Atrial premature depolarization: Secondary | ICD-10-CM | POA: Diagnosis not present

## 2021-12-23 DIAGNOSIS — I350 Nonrheumatic aortic (valve) stenosis: Secondary | ICD-10-CM

## 2021-12-23 DIAGNOSIS — E785 Hyperlipidemia, unspecified: Secondary | ICD-10-CM

## 2021-12-23 DIAGNOSIS — G4733 Obstructive sleep apnea (adult) (pediatric): Secondary | ICD-10-CM | POA: Diagnosis not present

## 2021-12-23 MED ORDER — CHLORTHALIDONE 25 MG PO TABS: ORAL_TABLET | ORAL | 3 refills | Status: DC

## 2021-12-23 MED ORDER — METOPROLOL SUCCINATE ER 25 MG PO TB24: 25.0000 mg | ORAL_TABLET | Freq: Every day | ORAL | 3 refills | Status: DC

## 2021-12-23 NOTE — Assessment & Plan Note (Addendum)
Well controlled on low dose statin PCP following. Having issues with memory low - consider 1 month statin holiday to determine if stain effect - defer to PCP.

## 2021-12-23 NOTE — Assessment & Plan Note (Addendum)
Symptomatic - will start low dose Toprol & reduce Chlorthalidone to12.5 mg (having frequent urination).  Hopefully this will help her symptoms but they clearly are symptomatic PACs.

## 2021-12-23 NOTE — Patient Instructions (Addendum)
Medication Instructions:   Chlorthalidone 1/2 tablet of 25 mg daily    Metoprolol Succinate 25 mg one tablet daily  *If you need a refill on your cardiac medications before your next appointment, please call your pharmacy*   Lab Work:  NOT NEEDED     Testing/Procedures: WILL BE SCHEDULE AT [place] 2024 Your physician has requested that you have an echocardiogram. Echocardiography is a painless test that uses sound waves to create images of your heart. It provides your doctor with information about the size and shape of your heart and how well your heart's chambers and valves are working. This procedure takes approximately one hour. There are no restrictions for this procedure.   Follow-Up: At [facility], you and your health needs are our priority.  As part of our continuing mission to provide you with exceptional heart care, we have created designated Provider Care Teams.  These Care Teams include your primary Cardiologist (physician) and Advanced Practice Providers (APPs -  Physician Assistants and Nurse Practitioners) who all work together to provide you with the care you need, when you need it.  We recommend signing up for the patient portal called "MyChart".  Sign up information is provided on this After Visit Summary.  MyChart is used to connect with patients for Virtual Visits (Telemedicine).  Patients are able to view lab/test results, encounter notes, upcoming appointments, etc.  Non-urgent messages can be sent to your provider as well.   To learn more about what you can do with MyChart, go to [email or web address].    Your next appointment:   5 or 6 month(s)  The format for your next appointment:   In [place]  Provider:   Glenetta Hew, MD

## 2021-12-23 NOTE — Progress Notes (Signed)
Primary Care Provider: Horald Pollen, MD Cardiologist: Glenetta Hew, MD Electrophysiologist: None  Clinic Note: Chief Complaint  Patient presents with   Follow-up    6 months   Cardiac Valve Problem    Mitral regurgitation   Palpitations    Zio patch monitor reviewed.   ===================================  ASSESSMENT/PLAN   Problem List Items Addressed This Visit       Cardiology Problems   Moderate to severe mitral regurgitation - Primary (Chronic)    Stable findings on follow-up echocardiogram.  We will continue to monitor-check another 1 in 6 months to ensure that no progression of disease.  Plan was follow-up echo in January 2024.  Monitor for progression of symptoms, low threshold to refer back to Dr.Bartle.      Relevant Medications   chlorthalidone (HYGROTON) 25 MG tablet   metoprolol succinate (TOPROL XL) 25 MG 24 hr tablet   Other Relevant Orders   ECHOCARDIOGRAM COMPLETE   Essential hypertension (Chronic)    BP well controlled.  Would not want to allow her to get to hypertensive.  I think she does have blood pressure room to add a beta-blocker for her palpitations.  In doing so we will reduce her chlorthalidone to 12-1/2 mg daily.  Continue Diovan.      Relevant Medications   chlorthalidone (HYGROTON) 25 MG tablet   metoprolol succinate (TOPROL XL) 25 MG 24 hr tablet   Mild aortic stenosis (Chronic)    Heard on exam, but not significant.  Continue to monitor with echoes.      Relevant Medications   chlorthalidone (HYGROTON) 25 MG tablet   metoprolol succinate (TOPROL XL) 25 MG 24 hr tablet   Other Relevant Orders   ECHOCARDIOGRAM COMPLETE   PAC (premature atrial contraction) -wiht Atrial Runs (Chronic)    Symptomatic - will start low dose Toprol & reduce Chlorthalidone to12.5 mg (having frequent urination).  Hopefully this will help her symptoms but they clearly are symptomatic PACs.      Relevant Medications   chlorthalidone (HYGROTON)  25 MG tablet   metoprolol succinate (TOPROL XL) 25 MG 24 hr tablet   Valvular heart disease (Chronic)    Moderate to severe MR along with mild AS.  Thankfully, EF remains preserved and she is relatively asymptomatic.  NYHA Class I symptoms.      Relevant Medications   chlorthalidone (HYGROTON) 25 MG tablet   metoprolol succinate (TOPROL XL) 25 MG 24 hr tablet   Other Relevant Orders   ECHOCARDIOGRAM COMPLETE     Other   Dyslipidemia (Chronic)    Well controlled on low dose statin PCP following. Having issues with memory low - consider 1 month statin holiday to determine if stain effect - defer to PCP.      OSA (obstructive sleep apnea) (Chronic)    Not on CPAP.      ===================================  HPI:    Lisa Miles is a 71 y.o. female with a PMH/problem list below who presents today for 78-monthfollow-up.  Patient Active Problem List   Diagnosis   Moderate to severe mitral regurgitation (rheumatic)    Seen by Dr. BCyndia Bent(CVTS) Jan '21: Stable echocardiogram.  Symptomatically stable.  Recommend close observation with annual echo.  Referred back for progression of symptoms.   Essential hypertension   Dyslipidemia   PAC (premature atrial contraction) / Pounding heartbeat- Atrial Runs   Mild aortic stenosis   Valvular heart disease -> moderate-severe MR and mild AS but stable EF 60 to  65%.   OSA (obstructive sleep apnea) - Non-restorative sleep   ASTHMA UNSPECIFIED WITH EXACERBATION   Lisa Miles was last seen on 06/06/2021 26-monthfollow-up after echocardiogram showing stable moderate to severe MR.  Doing well.  Still feeling the thumping sensation in her chest.  Noted more with lying down.  Worse in the morning.  Staying active but had a recent fall.  Had a couple episodes of heart rate going fast.  Some exertional dyspnea due to deconditioning but no real progression of dyspnea or chest pain pressure.  No heart failure symptoms of PND orthopnea or  edema.  Recent Hospitalizations: None  Reviewed  CV studies:    The following studies were reviewed today: (if available, images/films reviewed: From Epic Chart or Care Everywhere) Zio Monitor 05/2021:  Predominant Rhythm Is Sinus Rhythm heart rate range of 45 to 139 bpm with an average of 74 bpm. Occasional PACs (2.9%) noted with some couplets and triplets, but otherwise rare PVCs. 20 runs of atrial tachycardia: Fastest was 6 beats with a rate of 235 bpm; longest was 12 beats at a rate of 138 bpm (Lasting 5.2 seconds) Symptoms were noted with PACs and PVCs not with atrial tachycardia runs.   Interval History:   SJosie BurleighPerson returns here today for routine follow-up.  She denies any dyspnea or chest pain.  No PND, orthopnea or edema.  Mild end of day edema.  But the only thing she notes is this pounding sensation usually when she is lying in bed in the morning.  Otherwise does not bother her that much from a symptom standpoint, just annoying.  Despite having the irregular heartbeats or pounding beats, she is not noticing any tachycardic episodes.  No syncope/near syncope or TIA/amaurosis fugax.  No claudication.  CV Review of Symptoms (Summary): no chest pain or dyspnea on exertion positive for - edema, irregular heartbeat, palpitations, and minimal edema.  Pounding heartbeats noted above. negative for - chest pain, dyspnea on exertion, orthopnea, paroxysmal nocturnal dyspnea, rapid heart rate, shortness of breath, or lightheadedness, dizziness or wooziness, syncope/near syncope or TIA/amaurosis fugax, claudication  REVIEWED OF SYSTEMS   Review of Systems  Constitutional:  Positive for malaise/fatigue (Not a lot of energy, but doing okay.  Somewhat deconditioned.) and weight loss.  HENT:  Negative for nosebleeds.   Respiratory:  Positive for wheezing. Negative for cough and shortness of breath.   Gastrointestinal:  Negative for blood in stool and melena.  Genitourinary:  Negative for  hematuria.  Musculoskeletal:  Positive for joint pain.  Neurological:  Negative for dizziness and focal weakness.  Psychiatric/Behavioral:  Positive for memory loss. Negative for depression. The patient is not nervous/anxious and does not have insomnia.    I have reviewed and (if needed) personally updated the patient's problem list, medications, allergies, past medical and surgical history, social and family history.   PAST MEDICAL HISTORY   Past Medical History:  Diagnosis Date   Aortic stenosis, mild-moderate 07/2012   TTE October 21: Moderate aortic valve thickening with mild to moderate AI & mild AS; TEE: mild to moderate AI & MIld AS   Arthritis    Phreesia 11/06/2019   Asthma    Bilateral bunions    Bunionectomies performed   Bronchitis, chronic (H[facility]    Cataract    Phreesia 11/06/2019   Heart disease    Hyperlipidemia    Hypertension    Good control   Inguinodynia    Bilateral groin pain   Lower extremity  edema    Chronic. Venous Duplex 11/06/11 SUMMARY: 1) Bilateral Lower Extremities: No evidence of DVT or thrombophlebitis.  2) Right Common Femoral Vein: Demonstrated mild valvular insufficiency with a greater than (1) sec of duration. Mildly abnormal LE Venous duplex Doppler.   Obesity    Palpitations    Relatively well controlled   Scoliosis    DG Chest 2 View x-ray on 07/30/11 by Dr. Everlene Farrier shows a scoliosis.   Severe mitral regurgitation by prior echocardiogram 07/2012   Mild AMVL prolapse, Mod MR -- no MVP noted in 01/2018 -> 11/'21: Severe MR due to restricted movement of P3 scallop of PMVL (IIIB) due to incomplete leaflet coaptation -> thickened leaflets consistent with Rheumatic Heart Valve Disease.    PAST SURGICAL HISTORY   Past Surgical History:  Procedure Laterality Date   ABDOMINAL HYSTERECTOMY     ANKLE ARTHROSCOPY Left    BUNIONECTOMY Bilateral    CARDIAC EVENT MONITOR  03/2020   (04/05/2020 -05/04/2020): Mostly NSR.  Heart rate range 42-148 bpm.  3 short  bursts of 4-5 beats NSVT (asymptomatic).  116 beat run of PAT.  Rare PACs and PVCs.  Some bigeminy and trigeminy.   CARDIOPULMONARY EXERCISE TEST (CPX)  05/09/2020   (done with Ex St Echo): Normal Functional Capacity. No indication for CP limitations.  Body Habitus contributes to Exercise Intolerance.   EXERCISE STRESS ECHO  05/09/2020   TTE shows normal EF 65% with LVH No pericardial effusion Normal RV size and function Moderate appearing rheumatic MR/AR.;; -- NO COMMENT ON EXERCISE EFFECT ON MR!!! (even though this was the reason for ordering the test)   EYE SURGERY N/A    Phreesia 11/06/2019   KNEE ARTHROSCOPY Right    Left knee open surgery     pt thinks she only had shots in L knee, not surgery   RIGHT/LEFT HEART CATH AND CORONARY ANGIOGRAPHY N/A 03/28/2020   RIGHT/LEFT HEART CATH AND CORONARY ANGIOGRAPHY;  Leonie Man, MD;  Location: [facility] INVASIVE CV LAB; angiographically normal coronary arteries.  Cloacal LM. Normal RHC Pressures (PAP 40/13 - mean 22 mmHg, PCWP 50mHg, V wave 25 mmHg c/w MR, LVEDP 15 mmHg); CO/CI 5.02 / 2.45 (reduced).   TEE WITHOUT CARDIOVERSION N/A 03/13/2020   Procedure: TRANSESOPHAGEAL ECHOCARDIOGRAM (TEE);  Surgeon: OGeralynn Rile MD;  [facility];;; Severe MR 2/2 restricted P3 scallop of PMVL (IIIB) w/ incomplete leaflet coaptation.  Thickened /degenerative leaflets c/w Rheumatic Valvular Heart Disease.  2D PISA radius 0.9 cm with 2D ERO 0.26 cm2 with R Vol 62 cc. PV blunting w/ BP 109/60 mmHg. No MS. Mild-mod AI / Mild AS. Gr 2 plaque Aorta   TOTAL ABDOMINAL HYSTERECTOMY     TRANSTHORACIC ECHOCARDIOGRAM  05/09/2021   EF 60 to 65%.  No RWMA.  GR 1 DD with elevated LAP-moderately dilated left atrium (LA).  Normal RV size and function.  Moderate to severe MR with no MS.  Moderate aortic calcification/sclerosis with only mild stenosis.  Ascending aorta 39 mm.   TRANSTHORACIC ECHOCARDIOGRAM  01/2018   a) Normal LV size.  Mod Conc LVH.  EF 60-65%.  No R WMA.  GR  1 DD (high P).  Mild AS (MG 16 mmHg) w/ Mod AI.  Severe LA dilation.  Mild MR.; 01/2020:  EF 65-70%. No RWMA.  Severe MR w/ restricted PMVL movement.  (Recommend TEE).  Mod AoV thickening w/ mild to mod AS & Mod AI.  Nl RV fxn.  Mildly elevated filling P. Severe LA dilation.  Zio Patch Monitor  05/2021   Predominant Rhythm Is Sinus Rhythm - HR range 45 -139 bpm & avg 74 bpm.  Occ PACs (2.9%) w/ some couplets and triplets,& rare PVCs. 20 Atrial Runs: Fastest - 6 beats w/ rate 235 bpm; Longest 12 beats @ 138 bpm (~ 5.2 sec); Symptoms were noted with PACs and PVCs not with atrial tachycardia runs.    Immunization History  Administered Date(s) Administered   Fluad Quad(high Dose 65+) 01/06/2019, 02/20/2021   Influenza Whole 03/27/2009   Influenza, High Dose Seasonal PF 04/01/2018   Influenza,inj,Quad PF,6+ Mos 03/15/2012, 03/28/2013, 07/22/2016   Moderna Sars-Covid-2 Vaccination 06/10/2019, 07/08/2019, 02/27/2020, 09/02/2020   Pneumococcal Conjugate-13 07/22/2016   Pneumococcal Polysaccharide-23 04/02/2009, 09/22/2018   Tdap 03/28/2013    MEDICATIONS/ALLERGIES   Current Meds  Medication Sig   amoxicillin (AMOXIL) 500 MG tablet Take 500 mg by mouth See admin instructions. Reports taking 4 capsules before dental procedures   aspirin EC 81 MG tablet Take 81 mg by mouth daily. Swallow whole.   chlorthalidone (HYGROTON) 25 MG tablet TAKE 1 TABLET(25 MG) BY MOUTH DAILY   Cholecalciferol (VITAMIN D) 50 MCG (2000 UT) CAPS Take 2,000 Units by mouth daily.   esomeprazole (NEXIUM) 40 MG capsule TAKE 1 CAPSULE BY MOUTH TWICE DAILY BEFORE A MEAL   estradiol (ESTRACE) 2 MG tablet Take 2 mg by mouth daily.   hydroxypropyl methylcellulose / hypromellose (ISOPTO TEARS / GONIOVISC) 2.5 % ophthalmic solution Place 1 drop into both eyes in the morning and at bedtime.   MODERNA COVID-19 BIVAL BOOSTER 50 MCG/0.5ML injection    rosuvastatin (CRESTOR) 10 MG tablet Take 1 tablet (10 mg total) by mouth daily.    traMADol (ULTRAM) 50 MG tablet Take 50 mg by mouth every 6 (six) hours as needed.   valsartan (DIOVAN) 320 MG tablet TAKE 1 TABLET(320 MG) BY MOUTH DAILY    No Known Allergies  SOCIAL HISTORY/FAMILY HISTORY   Reviewed in Epic:  Pertinent findings:  Social History   Tobacco Use   Smoking status: Never   Smokeless tobacco: Never  Vaping Use   Vaping Use: Never used  Substance Use Topics   Alcohol use: No   Drug use: No   Social History   Social History Narrative   Lives with her husband.  Their children live nearby.   Right handed   Caffeine: 1 cup/day    OBJCTIVE -PE, EKG, labs   Wt Readings from Last 3 Encounters:  12/23/21 206 lb 6.4 oz (93.6 kg)  07/02/21 208 lb (94.3 kg)  06/06/21 210 lb 12.8 oz (95.6 kg)    Physical Exam: BP 122/80   Pulse 97   Ht '5\' 6"'$  (1.676 m)   Wt 206 lb 6.4 oz (93.6 kg)   SpO2 96%   BMI 33.31 kg/m  Physical Exam Vitals reviewed.  Constitutional:      Appearance: Normal appearance. She is obese.  HENT:     Head: Normocephalic and atraumatic.  Neck:     Vascular: No carotid bruit or JVD.  Cardiovascular:     Rate and Rhythm: Normal rate. Occasional Extrasystoles are present.    Pulses: Normal pulses and intact distal pulses.     Heart sounds: S1 normal and S2 normal. Heart sounds are distant. Murmur heard.     High-pitched harsh crescendo-decrescendo midsystolic murmur is present with a grade of 2/6 at the upper right sternal border radiating to the neck.     High-pitched blowing holosystolic murmur of grade 2/6 is  also present at the apex radiating to the axilla.     Low-pitched rumbling decrescendo presystolic murmur is present with a grade of 1/4 at the apex radiating to the back.     No friction rub. No S4 sounds.  Pulmonary:     Effort: Pulmonary effort is normal. No respiratory distress.     Breath sounds: Normal breath sounds. No wheezing, rhonchi or rales.  Chest:     Chest wall: Tenderness present.  Musculoskeletal:         General: No swelling (Trivial). Normal range of motion.     Cervical back: Normal range of motion and neck supple.  Skin:    General: Skin is warm and dry.  Neurological:     General: No focal deficit present.     Mental Status: She is alert and oriented to St[place], place, and time.     Gait: Gait abnormal.  Psychiatric:        Mood and Affect: Mood normal.        Behavior: Behavior normal.        Thought Content: Thought content normal.        Judgment: Judgment normal.    Adult ECG Report N/A  Recent Labs: Reviewed. Lab Results  Component Value Date   CHOL 161 07/02/2021   HDL 60.90 07/02/2021   LDLCALC 77 07/02/2021   TRIG 112.0 07/02/2021   CHOLHDL 3 07/02/2021   Lab Results  Component Value Date   CREATININE 0.81 07/02/2021   BUN 14 07/02/2021   NA 139 07/02/2021   K 3.5 07/02/2021   CL 100 07/02/2021   CO2 33 (H) 07/02/2021      Latest Ref Rng & Units 07/02/2021   11:50 AM 06/11/2020    1:27 PM 03/28/2020    8:11 AM  CBC  WBC 4.0 - 10.5 K/uL 4.9  4.5    Hemoglobin 12.0 - 15.0 g/dL 13.2  13.5  11.6    11.6   Hematocrit 36.0 - 46.0 % 38.7  40.1  34.0    34.0   Platelets 150.0 - 400.0 K/uL 290.0       Lab Results  Component Value Date   HGBA1C 6.0 07/02/2021   Lab Results  Component Value Date   TSH 2.470 06/11/2020    ================================================== I spent a total of 20 minutes with the patient spent in direct patient consultation.  Additional time spent with chart review  / charting (studies, outside notes, etc): 14 min Total Time: 34 min  Current medicines are reviewed at length with the patient today.  (+/- concerns) none  Notice: This dictation was prepared with Dragon dictation along with smart phrase technology. Any transcriptional errors that result from this process are unintentional and may not be corrected upon review.  Studies Ordered:   Orders Placed This Encounter  Procedures   ECHOCARDIOGRAM COMPLETE    Meds ordered this encounter  Medications   chlorthalidone (HYGROTON) 25 MG tablet    Sig: Take 1/2 tablet of 25 mg Chlorthalidone  by mouth daily    Dispense:  45 tablet    Refill:  3    Direction change , discontinue previous dose.   metoprolol succinate (TOPROL XL) 25 MG 24 hr tablet    Sig: Take 1 tablet (25 mg total) by mouth daily.    Dispense:  90 tablet    Refill:  3    Patient Instructions / Medication Changes & Studies & Tests Ordered   Patient Instructions  Medication Instructions:   Chlorthalidone 1/2 tablet of 25 mg daily    Metoprolol Succinate 25 mg one tablet daily  *If you need a refill on your cardiac medications before your next appointment, please call your pharmacy*   Lab Work:  NOT NEEDED     Testing/Procedures: WILL BE SCHEDULE AT [facility] 2024 Your physician has requested that you have an echocardiogram. Echocardiography is a painless test that uses sound waves to create images of your heart. It provides your doctor with information about the size and shape of your heart and how well your heart's chambers and valves are working. This procedure takes approximately one hour. There are no restrictions for this procedure.   Follow-Up: At [facility], you and your health needs are our priority.  As part of our continuing mission to provide you with exceptional heart care, we have created designated Provider Care Teams.  These Care Teams include your primary Cardiologist (physician) and Advanced Practice Providers (APPs -  Physician Assistants and Nurse Practitioners) who all work together to provide you with the care you need, when you need it.  We recommend signing up for the patient portal called "MyChart".  Sign up information is provided on this After Visit Summary.  MyChart is used to connect with patients for Virtual Visits (Telemedicine).  Patients are able to view lab/test results, encounter notes, upcoming  appointments, etc.  Non-urgent messages can be sent to your provider as well.   To learn more about what you can do with MyChart, go to [email or web address].    Your next appointment:   5 or 6 month(s)  The format for your next appointment:   In [place]  Provider:   Glenetta Hew, MD       Leonie Man, MD, MS Glenetta Hew, M.D., M.S. Interventional Cardiologist  [place]  Pager # [phone] Phone # [phone] [address] [place], [place] [address]   Thank you for choosing [facility] [place]!!

## 2022-01-05 ENCOUNTER — Telehealth: Payer: Self-pay | Admitting: Cardiology

## 2022-01-05 ENCOUNTER — Encounter: Payer: Self-pay | Admitting: Cardiology

## 2022-01-05 NOTE — Telephone Encounter (Signed)
Returned the call to the patient. The patient stated that she has a pill splitter now and nothing further is needed.

## 2022-01-05 NOTE — Telephone Encounter (Signed)
Pt c/o medication issue:  1. Name of Medication: chlorthalidone (HYGROTON) 25 MG tablet  2. How are you currently taking this medication (dosage and times per day)? As prescribed  3. Are you having a reaction (difficulty breathing--STAT)? No   4. What is your medication issue? Patient is calling wanting to know if a lower dosage can be prescribed so she doesn't have to cut tablet in half. She states it is hard to cut the tablet due to it being so small and hard. Please advise.

## 2022-01-05 NOTE — Assessment & Plan Note (Signed)
Moderate to severe MR along with mild AS.  Thankfully, EF remains preserved and she is relatively asymptomatic.  NYHA Class I symptoms.

## 2022-01-05 NOTE — Telephone Encounter (Signed)
Patient called back requesting this previous message be disregarded due to her husband giving her a tablet splitter and it working perfect.

## 2022-01-05 NOTE — Assessment & Plan Note (Signed)
Stable findings on follow-up echocardiogram.  We will continue to monitor-check another 1 in 6 months to ensure that no progression of disease.  Plan was follow-up echo in January 2024.  Monitor for progression of symptoms, low threshold to refer back to Dr.Bartle.

## 2022-01-05 NOTE — Assessment & Plan Note (Signed)
BP well controlled.  Would not want to allow her to get to hypertensive.  I think she does have blood pressure room to add a beta-blocker for her palpitations.  In doing so we will reduce her chlorthalidone to 12-1/2 mg daily.  Continue Diovan.

## 2022-01-05 NOTE — Assessment & Plan Note (Signed)
Heard on exam, but not significant.  Continue to monitor with echoes.

## 2022-01-06 DIAGNOSIS — Z86010 Personal history of colonic polyps: Secondary | ICD-10-CM | POA: Diagnosis not present

## 2022-01-06 DIAGNOSIS — K219 Gastro-esophageal reflux disease without esophagitis: Secondary | ICD-10-CM | POA: Diagnosis not present

## 2022-01-06 DIAGNOSIS — Z1211 Encounter for screening for malignant neoplasm of colon: Secondary | ICD-10-CM | POA: Diagnosis not present

## 2022-01-06 DIAGNOSIS — K5730 Diverticulosis of large intestine without perforation or abscess without bleeding: Secondary | ICD-10-CM | POA: Diagnosis not present

## 2022-01-06 DIAGNOSIS — Z800 Family history of malignant neoplasm of digestive organs: Secondary | ICD-10-CM | POA: Diagnosis not present

## 2022-02-17 ENCOUNTER — Other Ambulatory Visit: Payer: Self-pay | Admitting: Emergency Medicine

## 2022-02-17 DIAGNOSIS — E785 Hyperlipidemia, unspecified: Secondary | ICD-10-CM

## 2022-02-18 DIAGNOSIS — Z800 Family history of malignant neoplasm of digestive organs: Secondary | ICD-10-CM | POA: Diagnosis not present

## 2022-02-18 DIAGNOSIS — K5730 Diverticulosis of large intestine without perforation or abscess without bleeding: Secondary | ICD-10-CM | POA: Diagnosis not present

## 2022-02-18 DIAGNOSIS — Z86010 Personal history of colonic polyps: Secondary | ICD-10-CM | POA: Diagnosis not present

## 2022-02-18 DIAGNOSIS — Z1211 Encounter for screening for malignant neoplasm of colon: Secondary | ICD-10-CM | POA: Diagnosis not present

## 2022-02-18 LAB — HM COLONOSCOPY

## 2022-03-03 ENCOUNTER — Telehealth: Payer: Self-pay | Admitting: Cardiology

## 2022-03-03 NOTE — Telephone Encounter (Signed)
Patient stated that her GI Lisa Miles wanted the last OV notes. She also asked the reason she is taking medications metoprolol succinate and chlorthalidone. Medications explained. Called Dr. Lorie Apley office to inquire what records they need. They want last OV notes faxed to [phone] (done).

## 2022-03-03 NOTE — Telephone Encounter (Signed)
Calling to speak to the nurse, in regarding to the problems dr harding been treating her for. Please advise

## 2022-04-01 ENCOUNTER — Other Ambulatory Visit: Payer: Self-pay | Admitting: Gastroenterology

## 2022-05-15 DIAGNOSIS — R0989 Other specified symptoms and signs involving the circulatory and respiratory systems: Secondary | ICD-10-CM | POA: Diagnosis not present

## 2022-05-15 DIAGNOSIS — J342 Deviated nasal septum: Secondary | ICD-10-CM | POA: Diagnosis not present

## 2022-05-19 ENCOUNTER — Ambulatory Visit (HOSPITAL_COMMUNITY): Payer: PPO | Attending: Cardiology

## 2022-05-19 DIAGNOSIS — I38 Endocarditis, valve unspecified: Secondary | ICD-10-CM | POA: Diagnosis not present

## 2022-05-19 DIAGNOSIS — I350 Nonrheumatic aortic (valve) stenosis: Secondary | ICD-10-CM | POA: Diagnosis not present

## 2022-05-19 DIAGNOSIS — I351 Nonrheumatic aortic (valve) insufficiency: Secondary | ICD-10-CM

## 2022-05-19 DIAGNOSIS — I340 Nonrheumatic mitral (valve) insufficiency: Secondary | ICD-10-CM | POA: Insufficient documentation

## 2022-05-20 LAB — ECHOCARDIOGRAM COMPLETE
AR max vel: 2.11 cm2
AV Area VTI: 2.05 cm2
AV Area mean vel: 2.01 cm2
AV Mean grad: 18.1 mmHg
AV Peak grad: 34.2 mmHg
Ao pk vel: 2.92 m/s
Area-P 1/2: 2.47 cm2
P 1/2 time: 704 msec
S' Lateral: 3.2 cm

## 2022-06-06 ENCOUNTER — Other Ambulatory Visit: Payer: Self-pay | Admitting: Emergency Medicine

## 2022-06-06 DIAGNOSIS — I10 Essential (primary) hypertension: Secondary | ICD-10-CM

## 2022-06-17 ENCOUNTER — Ambulatory Visit: Payer: PPO | Admitting: Cardiology

## 2022-07-07 ENCOUNTER — Other Ambulatory Visit: Payer: Self-pay | Admitting: Emergency Medicine

## 2022-07-07 ENCOUNTER — Ambulatory Visit (INDEPENDENT_AMBULATORY_CARE_PROVIDER_SITE_OTHER): Payer: PPO | Admitting: Emergency Medicine

## 2022-07-07 ENCOUNTER — Encounter: Payer: Self-pay | Admitting: Emergency Medicine

## 2022-07-07 VITALS — BP 120/64 | HR 62 | Temp 98.4°F | Ht 66.0 in | Wt 201.5 lb

## 2022-07-07 DIAGNOSIS — I10 Essential (primary) hypertension: Secondary | ICD-10-CM

## 2022-07-07 DIAGNOSIS — R1031 Right lower quadrant pain: Secondary | ICD-10-CM

## 2022-07-07 DIAGNOSIS — E785 Hyperlipidemia, unspecified: Secondary | ICD-10-CM

## 2022-07-07 DIAGNOSIS — I38 Endocarditis, valve unspecified: Secondary | ICD-10-CM

## 2022-07-07 DIAGNOSIS — H579 Unspecified disorder of eye and adnexa: Secondary | ICD-10-CM | POA: Diagnosis not present

## 2022-07-07 DIAGNOSIS — R413 Other amnesia: Secondary | ICD-10-CM

## 2022-07-07 DIAGNOSIS — Z13228 Encounter for screening for other metabolic disorders: Secondary | ICD-10-CM | POA: Diagnosis not present

## 2022-07-07 DIAGNOSIS — Z0001 Encounter for general adult medical examination with abnormal findings: Secondary | ICD-10-CM

## 2022-07-07 DIAGNOSIS — Z23 Encounter for immunization: Secondary | ICD-10-CM | POA: Diagnosis not present

## 2022-07-07 DIAGNOSIS — Z1329 Encounter for screening for other suspected endocrine disorder: Secondary | ICD-10-CM

## 2022-07-07 DIAGNOSIS — G8929 Other chronic pain: Secondary | ICD-10-CM

## 2022-07-07 DIAGNOSIS — Z13220 Encounter for screening for lipoid disorders: Secondary | ICD-10-CM | POA: Diagnosis not present

## 2022-07-07 DIAGNOSIS — R519 Headache, unspecified: Secondary | ICD-10-CM

## 2022-07-07 DIAGNOSIS — Z1231 Encounter for screening mammogram for malignant neoplasm of breast: Secondary | ICD-10-CM

## 2022-07-07 DIAGNOSIS — Z130 Encounter for screening for diseases of the blood and blood-forming organs and certain disorders involving the immune mechanism: Secondary | ICD-10-CM

## 2022-07-07 DIAGNOSIS — R079 Chest pain, unspecified: Secondary | ICD-10-CM

## 2022-07-07 NOTE — Assessment & Plan Note (Signed)
Pain management discussed.  May take Tylenol and or Advil as needed. Clinically stable.  No red flag signs or symptoms. Recommend neurology evaluation. Referral placed today.

## 2022-07-07 NOTE — Assessment & Plan Note (Addendum)
Chronic stable condition. Continue rosuvastatin 10 mg daily. The 10-year ASCVD risk score (Arnett DK, et al., 2019) is: 9.4%   Values used to calculate the score:     Age: 72 years     Sex: Female     Is Non-Hispanic African American: Yes     Diabetic: No     Tobacco smoker: No     Systolic Blood Pressure: 123456 mmHg     Is BP treated: Yes     HDL Cholesterol: 60.9 mg/dL     Total Cholesterol: 161 mg/dL

## 2022-07-07 NOTE — Assessment & Plan Note (Signed)
Becoming an ongoing issue and affecting quality of life. Needs neurology evaluation for cognitive evaluation and neuropsychiatric testing. Referral placed today.

## 2022-07-07 NOTE — Assessment & Plan Note (Signed)
Recently seen by eye doctor.  Wears glasses. No abnormal findings.

## 2022-07-07 NOTE — Progress Notes (Signed)
Lisa Miles 72 y.o.   Chief Complaint  Patient presents with   Annual Exam    Concerns about memory loss, headaches,and her vision     HISTORY OF PRESENT ILLNESS: This is a 72 y.o. female here for annual exam. Also concerned about memory issues for the past several months along with chronic headaches almost daily, and occasional blurry vision episodes.  No other associated symptoms. Also has history of hypertension, dyslipidemia, valvular heart disease, stable.  Sees cardiologist on a regular basis. No other complaints or medical concerns today.  HPI   Prior to Admission medications   Medication Sig Start Date End Date Taking? Authorizing Provider  aspirin EC 81 MG tablet Take 81 mg by mouth daily. Swallow whole.   Yes [provider]  chlorthalidone (HYGROTON) 25 MG tablet Take 1/2 tablet of 25 mg Chlorthalidone  by mouth daily 12/23/21  Yes Leonie Man, MD  Cholecalciferol (VITAMIN D) 50 MCG (2000 UT) CAPS Take 2,000 Units by mouth daily.   Yes [provider]  esomeprazole (NEXIUM) 40 MG capsule TAKE 1 CAPSULE BY MOUTH TWICE DAILY BEFORE A MEAL 04/02/22  Yes Zehr, Janett Billow D, PA-C  estradiol (ESTRACE) 2 MG tablet Take 2 mg by mouth daily.   Yes [provider]  metoprolol succinate (TOPROL XL) 25 MG 24 hr tablet Take 1 tablet (25 mg total) by mouth daily. 12/23/21  Yes Leonie Man, MD  rosuvastatin (CRESTOR) 10 MG tablet TAKE 1 TABLET(10 MG) BY MOUTH DAILY 02/17/22  Yes Catherina Pates, Ines Bloomer, MD  traMADol (ULTRAM) 50 MG tablet Take 50 mg by mouth every 6 (six) hours as needed.   Yes [provider]  valsartan (DIOVAN) 320 MG tablet TAKE 1 TABLET(320 MG) BY MOUTH DAILY 06/07/22  Yes Inda Mcglothen, Ines Bloomer, MD  hydroxypropyl methylcellulose / hypromellose (ISOPTO TEARS / GONIOVISC) 2.5 % ophthalmic solution Place 1 drop into both eyes in the morning and at bedtime.    [provider]    No Known Allergies  Patient Active Problem  List   Diagnosis Date Noted   Memory problem 07/07/2022   Chronic nonintractable headache 07/07/2022   Visual complaint 07/07/2022   PAC (premature atrial contraction) -wiht Atrial Runs 07/19/2021   Mild aortic stenosis 06/06/2021   Valvular heart disease 02/20/2021   OSA (obstructive sleep apnea) 11/02/2019   Non-restorative sleep 08/24/2019   Primary osteoarthritis involving multiple joints 04/01/2018   OAB (overactive bladder) 04/01/2018   Thyromegaly 03/28/2013   Essential hypertension 07/14/2012   Dyslipidemia 05/30/2009   Moderate to severe mitral regurgitation 05/30/2009   ASTHMA UNSPECIFIED WITH EXACERBATION 04/02/2009    Past Medical History:  Diagnosis Date   Aortic stenosis, mild-moderate 07/2012   TTE October 21: Moderate aortic valve thickening with mild to moderate AI & mild AS; TEE: mild to moderate AI & MIld AS   Arthritis    Phreesia 11/06/2019   Asthma    Bilateral bunions    Bunionectomies performed   Bronchitis, chronic ([place])    Cataract    Phreesia 11/06/2019   Heart disease    Hyperlipidemia    Hypertension    Good control   Inguinodynia    Bilateral groin pain   Lower extremity edema    Chronic. Venous Duplex 11/06/11 SUMMARY: 1) Bilateral Lower Extremities: No evidence of DVT or thrombophlebitis.  2) Right Common Femoral Vein: Demonstrated mild valvular insufficiency with a greater than (1) sec of duration. Mildly abnormal LE Venous duplex Doppler.   Obesity  Palpitations    Relatively well controlled   Scoliosis    DG Chest 2 View x-ray on 07/30/11 by Dr. Everlene Farrier shows a scoliosis.   Severe mitral regurgitation by prior echocardiogram 07/2012   Mild AMVL prolapse, Mod MR -- no MVP noted in 01/2018 -> 11/'21: Severe MR due to restricted movement of P3 scallop of PMVL (IIIB) due to incomplete leaflet coaptation -> thickened leaflets consistent with Rheumatic Heart Valve Disease.    Past Surgical History:  Procedure Laterality Date   ABDOMINAL  HYSTERECTOMY     ANKLE ARTHROSCOPY Left    BUNIONECTOMY Bilateral    CARDIAC EVENT MONITOR  03/2020   (04/05/2020 -05/04/2020): Mostly NSR.  Heart rate range 42-148 bpm.  3 short bursts of 4-5 beats NSVT (asymptomatic).  116 beat run of PAT.  Rare PACs and PVCs.  Some bigeminy and trigeminy.   CARDIOPULMONARY EXERCISE TEST (CPX)  05/09/2020   (done with Ex St Echo): Normal Functional Capacity. No indication for CP limitations.  Body Habitus contributes to Exercise Intolerance.   EXERCISE STRESS ECHO  05/09/2020   TTE shows normal EF 65% with LVH No pericardial effusion Normal RV size and function Moderate appearing rheumatic MR/AR.;; -- NO COMMENT ON EXERCISE EFFECT ON MR!!! (even though this was the reason for ordering the test)   EYE SURGERY N/A    Phreesia 11/06/2019   KNEE ARTHROSCOPY Right    Left knee open surgery     pt thinks she only had shots in L knee, not surgery   RIGHT/LEFT HEART CATH AND CORONARY ANGIOGRAPHY N/A 03/28/2020   RIGHT/LEFT HEART CATH AND CORONARY ANGIOGRAPHY;  Leonie Man, MD;  Location: [facility] INVASIVE CV LAB; angiographically normal coronary arteries.  Cloacal LM. Normal RHC Pressures (PAP 40/13 - mean 22 mmHg, PCWP 21mHg, V wave 25 mmHg c/w MR, LVEDP 15 mmHg); CO/CI 5.02 / 2.45 (reduced).   TEE WITHOUT CARDIOVERSION N/A 03/13/2020   Procedure: TRANSESOPHAGEAL ECHOCARDIOGRAM (TEE);  Surgeon: OGeralynn Rile MD;  [facility];;; Severe MR 2/2 restricted P3 scallop of PMVL (IIIB) w/ incomplete leaflet coaptation.  Thickened /degenerative leaflets c/w Rheumatic Valvular Heart Disease.  2D PISA radius 0.9 cm with 2D ERO 0.26 cm2 with R Vol 62 cc. PV blunting w/ BP 109/60 mmHg. No MS. Mild-mod AI / Mild AS. Gr 2 plaque Aorta   TOTAL ABDOMINAL HYSTERECTOMY     TRANSTHORACIC ECHOCARDIOGRAM  05/09/2021   EF 60 to 65%.  No RWMA.  GR 1 DD with elevated LAP-moderately dilated left atrium (LA).  Normal RV size and function.  Moderate to severe MR with no MS.  Moderate  aortic calcification/sclerosis with only mild stenosis.  Ascending aorta 39 mm.   TRANSTHORACIC ECHOCARDIOGRAM  01/2018   a) Normal LV size.  Mod Conc LVH.  EF 60-65%.  No R WMA.  GR 1 DD (high P).  Mild AS (MG 16 mmHg) w/ Mod AI.  Severe LA dilation.  Mild MR.; 01/2020:  EF 65-70%. No RWMA.  Severe MR w/ restricted PMVL movement.  (Recommend TEE).  Mod AoV thickening w/ mild to mod AS & Mod AI.  Nl RV fxn.  Mildly elevated filling P. Severe LA dilation.   Zio Patch Monitor  05/2021   Predominant Rhythm Is Sinus Rhythm - HR range 45 -139 bpm & avg 74 bpm.  Occ PACs (2.9%) w/ some couplets and triplets,& rare PVCs. 20 Atrial Runs: Fastest - 6 beats w/ rate 235 bpm; Longest 12 beats @ 138 bpm (~ 5.2 sec);  Symptoms were noted with PACs and PVCs not with atrial tachycardia runs.    Social History   Socioeconomic History   Marital status: Married    Spouse name: Herbie [place]   Number of children: 2   Years of education: Not on file   Highest education level: Not on file  Occupational History   Occupation: production    Employer: Management consultant    Comment: check printing/shipping  Tobacco Use   Smoking status: Never   Smokeless tobacco: Never  Vaping Use   Vaping Use: Never used  Substance and Sexual Activity   Alcohol use: No   Drug use: No   Sexual activity: Never  Other Topics Concern   Not on file  Social History Narrative   Lives with her husband.  Their children live nearby.   Right handed   Caffeine: 1 cup/day   Social Determinants of Health   Financial Resource Strain: Low Risk  (09/12/2021)   Overall Financial Resource Strain (CARDIA)    Difficulty of Paying Living Expenses: Not hard at all  Food Insecurity: No Food Insecurity (09/12/2021)   Hunger Vital Sign    Worried About Running Out of Food in the Last Year: Never true    Ran Out of Food in the Last Year: Never true  Transportation Needs: No Transportation Needs (09/12/2021)   PRAPARE - Radiographer, therapeutic (Medical): No    Lack of Transportation (Non-Medical): No  Physical Activity: Insufficiently Active (09/12/2021)   Exercise Vital Sign    Days of Exercise per Week: 3 days    Minutes of Exercise per Session: 20 min  Stress: No Stress Concern Present (09/12/2021)   [place]    Feeling of Stress : Not at all  Social Connections: Moderately Isolated (09/12/2021)   Social Connection and Isolation Panel [NHANES]    Frequency of Communication with Friends and Family: Three times a week    Frequency of Social Gatherings with Friends and Family: Three times a week    Attends Religious Services: Never    Active Member of Clubs or Organizations: No    Attends Archivist Meetings: Never    Marital Status: Married  Human resources officer Violence: Not At Risk (09/12/2021)   Humiliation, Afraid, Rape, and Kick questionnaire    Fear of Current or Ex-Partner: No    Emotionally Abused: No    Physically Abused: No    Sexually Abused: No    Family History  Problem Relation Age of Onset   Arthritis Mother    Hyperlipidemia Mother    Diabetes Mother    Gout Mother    Hypertension Mother    Cancer Father        GI cancer   Diabetes Maternal Grandfather    Hypertension Brother    Diabetes Brother    Hypertension Brother    Migraines Neg Hx    Headache Neg Hx      Review of Systems  Constitutional: Negative.  Negative for chills and fever.  HENT: Negative.  Negative for congestion and sore throat.   Eyes:  Positive for blurred vision.  Respiratory: Negative.  Negative for cough and shortness of breath.   Cardiovascular: Negative.  Negative for chest pain and palpitations.  Gastrointestinal:  Negative for abdominal pain, diarrhea, nausea and vomiting.  Genitourinary: Negative.  Negative for dysuria and hematuria.  Skin: Negative.  Negative for rash.  Neurological:  Positive for headaches.  All other  systems reviewed and are negative.  Today's Vitals   07/07/22 1014  BP: 120/64  Pulse: 62  Temp: 98.4 F (36.9 C)  TempSrc: Oral  SpO2: 97%  Weight: 201 lb 8 oz (91.4 kg)  Height: '5\' 6"'$  (1.676 m)   Body mass index is 32.52 kg/m.   Physical Exam Vitals reviewed.  Constitutional:      General: She is not in acute distress.    Appearance: Normal appearance. She is well-developed.     Comments: BP 120/64   Pulse 62   Temp 98.4 F (36.9 C) (Oral)   Ht '5\' 6"'$  (1.676 m)   Wt 201 lb 8 oz (91.4 kg)   SpO2 97%   BMI 32.52 kg/m    HENT:     Head: Normocephalic and atraumatic.     Right Ear: Hearing, tympanic membrane, ear canal and external ear normal.     Left Ear: Hearing, tympanic membrane, ear canal and external ear normal.     Nose: Nose normal.     Mouth/Throat:     Mouth: Mucous membranes are moist.     Pharynx: Oropharynx is clear. Uvula midline.  Eyes:     General: Lids are normal.     Extraocular Movements: Extraocular movements intact.     Conjunctiva/sclera: Conjunctivae normal.     Pupils: Pupils are equal, round, and reactive to light.  Neck:     Thyroid: No thyroid mass or thyromegaly.     Vascular: Normal carotid pulses. No carotid bruit or JVD.     Trachea: Trachea normal.  Cardiovascular:     Rate and Rhythm: Normal rate and regular rhythm.     Heart sounds: Normal heart sounds.  Pulmonary:     Effort: Pulmonary effort is normal.     Breath sounds: Normal breath sounds.  Abdominal:     Palpations: Abdomen is soft.     Tenderness: There is no abdominal tenderness. There is no rebound.  Musculoskeletal:     Cervical back: Normal range of motion and neck supple.     Right lower leg: No edema.     Left lower leg: No edema.  Skin:    General: Skin is warm and dry.  Neurological:     General: No focal deficit present.     Mental Status: She is alert and oriented to [place], place, and time.     Sensory: No sensory deficit.     Motor: No weakness.      Coordination: Coordination normal.     Gait: Gait normal.  Psychiatric:        Mood and Affect: Mood normal.        Speech: Speech normal. Speech is not slurred.        Behavior: Behavior normal. Behavior is cooperative.      ASSESSMENT & PLAN: Problem List Items Addressed This Visit       Cardiovascular and Mediastinum   Essential hypertension (Chronic)    BP Readings from Last 3 Encounters:  07/07/22 120/64  12/23/21 122/80  07/02/21 116/70  Well-controlled hypertension. Continue valsartan 320 mg daily, metoprolol succinate 25 mg daily, and chlorthalidone 25 mg daily. Cardiovascular risks associated with hypertension discussed Dietary approaches to stop hypertension discussed.       Valvular heart disease (Chronic)    Stable chronic condition. Sees cardiologist on a regular basis.        Other   Dyslipidemia (Chronic)    Chronic stable condition. Continue rosuvastatin 10  mg daily. The 10-year ASCVD risk score (Arnett DK, et al., 2019) is: 9.4%   Values used to calculate the score:     Age: 14 years     Sex: Female     Is Non-Hispanic African American: Yes     Diabetic: No     Tobacco smoker: No     Systolic Blood Pressure: 123456 mmHg     Is BP treated: Yes     HDL Cholesterol: 60.9 mg/dL     Total Cholesterol: 161 mg/dL       Memory problem    Becoming an ongoing issue and affecting quality of life. Needs neurology evaluation for cognitive evaluation and neuropsychiatric testing. Referral placed today.      Relevant Orders   Ambulatory referral to Neurology   Chronic nonintractable headache    Pain management discussed.  May take Tylenol and or Advil as needed. Clinically stable.  No red flag signs or symptoms. Recommend neurology evaluation. Referral placed today.      Relevant Orders   Ambulatory referral to Neurology   Visual complaint    Recently seen by eye doctor.  Wears glasses. No abnormal findings.      Relevant Orders   Ambulatory  referral to Neurology   Other Visit Diagnoses     Encounter for general adult medical examination with abnormal findings    -  Primary   Relevant Orders   CBC with Differential   Comprehensive metabolic panel   Hemoglobin A1c   Lipid panel   TSH   Visit for screening mammogram       Screening for deficiency anemia       Relevant Orders   CBC with Differential   Screening for lipoid disorders       Relevant Orders   Lipid panel   Screening for endocrine, metabolic and immunity disorder       Relevant Orders   Comprehensive metabolic panel   Hemoglobin A1c   TSH   Need for vaccination       Relevant Orders   Flu Vaccine QUAD High Dose(Fluad)   Need for immunization against influenza       Relevant Orders   Flu Vaccine QUAD High Dose(Fluad) (Completed)      Modifiable risk factors discussed with patient. Anticipatory guidance according to age provided. The following topics were also discussed: Social Determinants of Health Smoking.  Non-smoker Diet and nutrition and need to decrease amount of daily carbohydrate intake and daily calories and increase amount of plant based protein in her diet Benefits of exercise Cancer screening and need for breast cancer screening with mammogram.  Colonoscopy report from 2023 reviewed. Vaccinations reviewed and recommendations Cardiovascular risk assessment The 10-year ASCVD risk score (Arnett DK, et al., 2019) is: 9.4%   Values used to calculate the score:     Age: 45 years     Sex: Female     Is Non-Hispanic African American: Yes     Diabetic: No     Tobacco smoker: No     Systolic Blood Pressure: 123456 mmHg     Is BP treated: Yes     HDL Cholesterol: 60.9 mg/dL     Total Cholesterol: 161 mg/dL Review of multiple chronic medical conditions under management Review of all medications Mental health including depression and anxiety Fall and accident prevention  Patient Instructions  Health Maintenance, Female Adopting a healthy  lifestyle and getting preventive care are important in promoting health and wellness. Ask your health  care provider about: The right schedule for you to have regular tests and exams. Things you can do on your own to prevent diseases and keep yourself healthy. What should I know about diet, weight, and exercise? Eat a healthy diet  Eat a diet that includes plenty of vegetables, fruits, low-fat dairy products, and lean protein. Do not eat a lot of foods that are high in solid fats, added sugars, or sodium. Maintain a healthy weight Body mass index (BMI) is used to identify weight problems. It estimates body fat based on height and weight. Your health care provider can help determine your BMI and help you achieve or maintain a healthy weight. Get regular exercise Get regular exercise. This is one of the most important things you can do for your health. Most adults should: Exercise for at least 150 minutes each week. The exercise should increase your heart rate and make you sweat (moderate-intensity exercise). Do strengthening exercises at least twice a week. This is in addition to the moderate-intensity exercise. Spend less time sitting. Even light physical activity can be beneficial. Watch cholesterol and blood lipids Have your blood tested for lipids and cholesterol at 72 years of age, then have this test every 5 years. Have your cholesterol levels checked more often if: Your lipid or cholesterol levels are high. You are older than 72 years of age. You are at high risk for heart disease. What should I know about cancer screening? Depending on your health history and family history, you may need to have cancer screening at various ages. This may include screening for: Breast cancer. Cervical cancer. Colorectal cancer. Skin cancer. Lung cancer. What should I know about heart disease, diabetes, and high blood pressure? Blood pressure and heart disease High blood pressure causes heart  disease and increases the risk of stroke. This is more likely to develop in people who have high blood pressure readings or are overweight. Have your blood pressure checked: Every 3-5 years if you are 7-46 years of age. Every year if you are 27 years old or older. Diabetes Have regular diabetes screenings. This checks your fasting blood sugar level. Have the screening done: Once every three years after age 93 if you are at a normal weight and have a low risk for diabetes. More often and at a younger age if you are overweight or have a high risk for diabetes. What should I know about preventing infection? Hepatitis B If you have a higher risk for hepatitis B, you should be screened for this virus. Talk with your health care provider to find out if you are at risk for hepatitis B infection. Hepatitis C Testing is recommended for: Everyone born from [birth year] through 1965. Anyone with known risk factors for hepatitis C. Sexually transmitted infections (STIs) Get screened for STIs, including gonorrhea and chlamydia, if: You are sexually active and are younger than 72 years of age. You are older than 72 years of age and your health care provider tells you that you are at risk for this type of infection. Your sexual activity has changed since you were last screened, and you are at increased risk for chlamydia or gonorrhea. Ask your health care provider if you are at risk. Ask your health care provider about whether you are at high risk for HIV. Your health care provider may recommend a prescription medicine to help prevent HIV infection. If you choose to take medicine to prevent HIV, you should first get tested for HIV. You should then be  tested every 3 months for as long as you are taking the medicine. Pregnancy If you are about to stop having your period (premenopausal) and you may become pregnant, seek counseling before you get pregnant. Take 400 to 800 micrograms (mcg) of folic acid every day if you  become pregnant. Ask for birth control (contraception) if you want to prevent pregnancy. Osteoporosis and menopause Osteoporosis is a disease in which the bones lose minerals and strength with aging. This can result in bone fractures. If you are 97 years old or older, or if you are at risk for osteoporosis and fractures, ask your health care provider if you should: Be screened for bone loss. Take a calcium or vitamin D supplement to lower your risk of fractures. Be given hormone replacement therapy (HRT) to treat symptoms of menopause. Follow these instructions at home: Alcohol use Do not drink alcohol if: Your health care provider tells you not to drink. You are pregnant, may be pregnant, or are planning to become pregnant. If you drink alcohol: Limit how much you have to: 0-1 drink a day. Know how much alcohol is in your drink. In the [place] one drink equals one 12 oz bottle of beer (355 mL), one 5 oz glass of wine (148 mL), or one 1 oz glass of hard liquor (44 mL). Lifestyle Do not use any products that contain nicotine or tobacco. These products include cigarettes, chewing tobacco, and vaping devices, such as e-cigarettes. If you need help quitting, ask your health care provider. Do not use street drugs. Do not share needles. Ask your health care provider for help if you need support or information about quitting drugs. General instructions Schedule regular health, dental, and eye exams. Stay current with your vaccines. Tell your health care provider if: You often feel depressed. You have ever been abused or do not feel safe at home. Summary Adopting a healthy lifestyle and getting preventive care are important in promoting health and wellness. Follow your health care provider's instructions about healthy diet, exercising, and getting tested or screened for diseases. Follow your health care provider's instructions on monitoring your cholesterol and blood pressure. This information  is not intended to replace advice given to you by your health care provider. Make sure you discuss any questions you have with your health care provider. Document Revised: 09/02/2020 Document Reviewed: 09/02/2020 Elsevier Patient Education  Firth, MD [facility]

## 2022-07-07 NOTE — Assessment & Plan Note (Signed)
BP Readings from Last 3 Encounters:  07/07/22 120/64  12/23/21 122/80  07/02/21 116/70  Well-controlled hypertension. Continue valsartan 320 mg daily, metoprolol succinate 25 mg daily, and chlorthalidone 25 mg daily. Cardiovascular risks associated with hypertension discussed Dietary approaches to stop hypertension discussed.

## 2022-07-07 NOTE — Assessment & Plan Note (Signed)
Stable chronic condition. Sees cardiologist on a regular basis.

## 2022-07-07 NOTE — Patient Instructions (Signed)

## 2022-07-16 ENCOUNTER — Encounter: Payer: Self-pay | Admitting: Nurse Practitioner

## 2022-07-16 ENCOUNTER — Ambulatory Visit: Payer: PPO | Attending: Cardiology | Admitting: Nurse Practitioner

## 2022-07-16 VITALS — BP 122/72 | HR 62 | Ht 66.0 in | Wt 205.0 lb

## 2022-07-16 DIAGNOSIS — G4733 Obstructive sleep apnea (adult) (pediatric): Secondary | ICD-10-CM | POA: Diagnosis not present

## 2022-07-16 DIAGNOSIS — I4719 Other supraventricular tachycardia: Secondary | ICD-10-CM

## 2022-07-16 DIAGNOSIS — I350 Nonrheumatic aortic (valve) stenosis: Secondary | ICD-10-CM

## 2022-07-16 DIAGNOSIS — I10 Essential (primary) hypertension: Secondary | ICD-10-CM

## 2022-07-16 DIAGNOSIS — I340 Nonrheumatic mitral (valve) insufficiency: Secondary | ICD-10-CM | POA: Diagnosis not present

## 2022-07-16 DIAGNOSIS — E782 Mixed hyperlipidemia: Secondary | ICD-10-CM

## 2022-07-16 DIAGNOSIS — I491 Atrial premature depolarization: Secondary | ICD-10-CM

## 2022-07-16 DIAGNOSIS — R002 Palpitations: Secondary | ICD-10-CM

## 2022-07-16 NOTE — Progress Notes (Signed)
Office Visit    Patient Name: Lisa Miles Date of Encounter: 07/16/2022  Primary Care Provider:  Horald Pollen, MD Primary Cardiologist:  Glenetta Hew, MD  Chief Complaint    72 year old female with a history of valvular heart disease (moderate to severe MR, mild AS), PACs, atrial tachycardia, hypertension, hyperlipidemia, and OSA not on CPAP who presents for follow-up related to valvular heart disease and hypertension.  Past Medical History    Past Medical History:  Diagnosis Date   Aortic stenosis, mild-moderate 07/2012   TTE October 21: Moderate aortic valve thickening with mild to moderate AI & mild AS; TEE: mild to moderate AI & MIld AS   Arthritis    Phreesia 11/06/2019   Asthma    Bilateral bunions    Bunionectomies performed   Bronchitis, chronic ([place])    Cataract    Phreesia 11/06/2019   Heart disease    Hyperlipidemia    Hypertension    Good control   Inguinodynia    Bilateral groin pain   Lower extremity edema    Chronic. Venous Duplex 11/06/11 SUMMARY: 1) Bilateral Lower Extremities: No evidence of DVT or thrombophlebitis.  2) Right Common Femoral Vein: Demonstrated mild valvular insufficiency with a greater than (1) sec of duration. Mildly abnormal LE Venous duplex Doppler.   Obesity    Palpitations    Relatively well controlled   Scoliosis    DG Chest 2 View x-ray on 07/30/11 by Dr. Everlene Farrier shows a scoliosis.   Severe mitral regurgitation by prior echocardiogram 07/2012   Mild AMVL prolapse, Mod MR -- no MVP noted in 01/2018 -> 11/'21: Severe MR due to restricted movement of P3 scallop of PMVL (IIIB) due to incomplete leaflet coaptation -> thickened leaflets consistent with Rheumatic Heart Valve Disease.   Past Surgical History:  Procedure Laterality Date   ABDOMINAL HYSTERECTOMY     ANKLE ARTHROSCOPY Left    BUNIONECTOMY Bilateral    CARDIAC EVENT MONITOR  03/2020   (04/05/2020 -05/04/2020): Mostly NSR.  Heart rate range 42-148 bpm.  3 short  bursts of 4-5 beats NSVT (asymptomatic).  116 beat run of PAT.  Rare PACs and PVCs.  Some bigeminy and trigeminy.   CARDIOPULMONARY EXERCISE TEST (CPX)  05/09/2020   (done with Ex St Echo): Normal Functional Capacity. No indication for CP limitations.  Body Habitus contributes to Exercise Intolerance.   EXERCISE STRESS ECHO  05/09/2020   TTE shows normal EF 65% with LVH No pericardial effusion Normal RV size and function Moderate appearing rheumatic MR/AR.;; -- NO COMMENT ON EXERCISE EFFECT ON MR!!! (even though this was the reason for ordering the test)   EYE SURGERY N/A    Phreesia 11/06/2019   KNEE ARTHROSCOPY Right    Left knee open surgery     pt thinks she only had shots in L knee, not surgery   RIGHT/LEFT HEART CATH AND CORONARY ANGIOGRAPHY N/A 03/28/2020   RIGHT/LEFT HEART CATH AND CORONARY ANGIOGRAPHY;  Leonie Man, MD;  Location: [facility] INVASIVE CV LAB; angiographically normal coronary arteries.  Cloacal LM. Normal RHC Pressures (PAP 40/13 - mean 22 mmHg, PCWP 39mmHg, V wave 25 mmHg c/w MR, LVEDP 15 mmHg); CO/CI 5.02 / 2.45 (reduced).   TEE WITHOUT CARDIOVERSION N/A 03/13/2020   Procedure: TRANSESOPHAGEAL ECHOCARDIOGRAM (TEE);  Surgeon: Geralynn Rile, MD;  [facility] ENDOSCOPY;;; Severe MR 2/2 restricted P3 scallop of PMVL (IIIB) w/ incomplete leaflet coaptation.  Thickened /degenerative leaflets c/w Rheumatic Valvular Heart Disease.  2D PISA radius 0.9  cm with 2D ERO 0.26 cm2 with R Vol 62 cc. PV blunting w/ BP 109/60 mmHg. No MS. Mild-mod AI / Mild AS. Gr 2 plaque Aorta   TOTAL ABDOMINAL HYSTERECTOMY     TRANSTHORACIC ECHOCARDIOGRAM  05/09/2021   EF 60 to 65%.  No RWMA.  GR 1 DD with elevated LAP-moderately dilated left atrium (LA).  Normal RV size and function.  Moderate to severe MR with no MS.  Moderate aortic calcification/sclerosis with only mild stenosis.  Ascending aorta 39 mm.   TRANSTHORACIC ECHOCARDIOGRAM  01/2018   a) Normal LV size.  Mod Conc LVH.  EF 60-65%.  No R WMA.  GR  1 DD (high P).  Mild AS (MG 16 mmHg) w/ Mod AI.  Severe LA dilation.  Mild MR.; 01/2020:  EF 65-70%. No RWMA.  Severe MR w/ restricted PMVL movement.  (Recommend TEE).  Mod AoV thickening w/ mild to mod AS & Mod AI.  Nl RV fxn.  Mildly elevated filling P. Severe LA dilation.   Zio Patch Monitor  05/2021   Predominant Rhythm Is Sinus Rhythm - HR range 45 -139 bpm & avg 74 bpm.  Occ PACs (2.9%) w/ some couplets and triplets,& rare PVCs. 20 Atrial Runs: Fastest - 6 beats w/ rate 235 bpm; Longest 12 beats @ 138 bpm (~ 5.2 sec); Symptoms were noted with PACs and PVCs not with atrial tachycardia runs.    Allergies  No Known Allergies   Labs/Other Studies Reviewed    The following studies were reviewed today: Zio Monitor 05/2021:  Predominant Rhythm Is Sinus Rhythm heart rate range of 45 to 139 bpm with an average of 74 bpm. Occasional PACs (2.9%) noted with some couplets and triplets, but otherwise rare PVCs. 20 runs of atrial tachycardia: Fastest was 6 beats with a rate of 235 bpm; longest was 12 beats at a rate of 138 bpm (Lasting 5.2 seconds) Symptoms were noted with PACs and PVCs not with atrial tachycardia runs.  Echo 05/19/2022: IMPRESSIONS    1. Left ventricular ejection fraction, by estimation, is 60 to 65%. The  left ventricle has normal function. The left ventricle has no regional  wall motion abnormalities. There is mild left ventricular hypertrophy.  Left ventricular diastolic parameters  are consistent with Grade II diastolic dysfunction (pseudonormalization).   2. Right ventricular systolic function is normal. The right ventricular  size is normal. There is mildly elevated pulmonary artery systolic  pressure. The estimated right ventricular systolic pressure is [id number] mmHg.   3. Left atrial size was moderately dilated.   4. The mitral valve is abnormal with post-inflammatory apperance.  Moderate mitral valve regurgitation. No evidence of mitral stenosis.   5. Tricuspid valve  regurgitation is moderate.   6. The aortic valve is abnormal. There is moderate calcification of the  aortic valve. There is moderate thickening of the aortic valve. Aortic  valve regurgitation is moderate. Mild to moderate aortic valve stenosis.  Aortic valve mean gradient measures  18.1 mmHg. Aortic valve Vmax measures 2.92 m/s.   7. Ascending aorta measurements are within normal limits for age when  indexed to body surface area.   8. The inferior vena cava is normal in size with greater than 50%  respiratory variability, suggesting right atrial pressure of 3 mmHg.   Recent Labs: No results found for requested labs within last 365 days.  Recent Lipid Panel    Component Value Date/Time   CHOL 161 07/02/2021 1150   CHOL 213 (H) 06/11/2020 1327  TRIG 112.0 07/02/2021 1150   HDL 60.90 07/02/2021 1150   HDL 66 06/11/2020 1327   CHOLHDL 3 07/02/2021 1150   VLDL 22.4 07/02/2021 1150   LDLCALC 77 07/02/2021 1150   LDLCALC 126 (H) 06/11/2020 1327    History of Present Illness    72 year old female with the above past medical history including valvular heart disease (moderate to severe MR, mild AS), PACs, atrial tachycardia, hypertension, hyperlipidemia, and OSA not on CPAP.  She has a history of moderate to severe MR, with mild AS.  She was seen by Dr. Cyndia Bent in 2021 who recommended close observation with routine repeat echocardiogram.  She also has a history of palpitations.  Cardiac monitor in 05/2021 showed predominantly sinus rhythm, occasional PACs, 20 runs of atrial tachycardia, symptoms are associated with PACs and PVCs.  She was last seen in the office on 12/23/2021 and was stable from a cardiac standpoint.  She did note ongoing palpitations and was started on low-dose metoprolol.  Chlorthalidone was reduced to 12.5 mg daily to accommodate for blood pressure.  Most recent echocardiogram in 04/2022 showed EF 60 to 65%, normal LV function, no RWMA, mild LVH, G2 DD, normal RV systolic  function, moderate mitral valve regurgitation, mild to moderate aortic stenosis, mean gradient 18.1 mmHg.  She presents today for follow-up.  Since her last visit she has been stable from a cardiac standpoint.  She denies any chest pain, dyspnea, palpitations, dizziness, presyncope, syncope, edema, PND, orthopnea, weight gain.  She has noticed morning headaches and ongoing memory loss.  She has an appointment scheduled to follow-up with neurology.  She also notes some ongoing postnasal drip and cough, she is following with ENT for this.  Overall, from a cardiac standpoint, she reports feeling well.  Home Medications    Current Outpatient Medications  Medication Sig Dispense Refill   amoxicillin (AMOXIL) 500 MG tablet Take 500 mg by mouth. Take 4 tablets before dental procedures.     aspirin EC 81 MG tablet Take 81 mg by mouth daily. Swallow whole.     chlorthalidone (HYGROTON) 25 MG tablet Take 1/2 tablet of 25 mg Chlorthalidone  by mouth daily 45 tablet 3   Cholecalciferol (VITAMIN D) 50 MCG (2000 UT) CAPS Take 2,000 Units by mouth daily.     esomeprazole (NEXIUM) 40 MG capsule TAKE 1 CAPSULE BY MOUTH TWICE DAILY BEFORE A MEAL 180 capsule 1   estradiol (ESTRACE) 2 MG tablet Take 2 mg by mouth daily.     hydroxypropyl methylcellulose / hypromellose (ISOPTO TEARS / GONIOVISC) 2.5 % ophthalmic solution Place 1 drop into both eyes in the morning and at bedtime.     ibuprofen (ADVIL) 600 MG tablet Take 600 mg by mouth every 8 (eight) hours as needed.     metoprolol succinate (TOPROL XL) 25 MG 24 hr tablet Take 1 tablet (25 mg total) by mouth daily. 90 tablet 3   rosuvastatin (CRESTOR) 10 MG tablet TAKE 1 TABLET(10 MG) BY MOUTH DAILY 90 tablet 3   traMADol (ULTRAM) 50 MG tablet Take 50 mg by mouth every 6 (six) hours as needed.     valsartan (DIOVAN) 320 MG tablet TAKE 1 TABLET(320 MG) BY MOUTH DAILY 90 tablet 1   No current facility-administered medications for this visit.     Review of Systems     She denies chest pain, palpitations, dyspnea, pnd, orthopnea, n, v, dizziness, syncope, edema, weight gain, or early satiety. All other systems reviewed and are otherwise negative except as  noted above.   Physical Exam    VS:  BP 122/72   Pulse 62   Ht 5\' 6"  (1.676 m)   Wt 205 lb (93 kg)   SpO2 98%   BMI 33.09 kg/m   GEN: Well nourished, well developed, in no acute distress. HEENT: normal. Neck: Supple, no JVD, carotid bruits, or masses. Cardiac: RRR, no murmurs, rubs, or gallops. No clubbing, cyanosis, edema.  Radials/DP/PT 2+ and equal bilaterally.  Respiratory:  Respirations regular and unlabored, clear to auscultation bilaterally. GI: Soft, nontender, nondistended, BS + x 4. MS: no deformity or atrophy. Skin: warm and dry, no rash. Neuro:  Strength and sensation are intact. Psych: Normal affect.  Accessory Clinical Findings    ECG personally reviewed by me today - NSR, 62 bpm - no acute changes.   Lab Results  Component Value Date   WBC 4.9 07/02/2021   HGB 13.2 07/02/2021   HCT 38.7 07/02/2021   MCV 88.9 07/02/2021   PLT 290.0 07/02/2021   Lab Results  Component Value Date   CREATININE 0.81 07/02/2021   BUN 14 07/02/2021   NA 139 07/02/2021   K 3.5 07/02/2021   CL 100 07/02/2021   CO2 33 (H) 07/02/2021   Lab Results  Component Value Date   ALT 20 07/02/2021   AST 18 07/02/2021   ALKPHOS 41 07/02/2021   BILITOT 0.3 07/02/2021   Lab Results  Component Value Date   CHOL 161 07/02/2021   HDL 60.90 07/02/2021   LDLCALC 77 07/02/2021   TRIG 112.0 07/02/2021   CHOLHDL 3 07/02/2021    Lab Results  Component Value Date   HGBA1C 6.0 07/02/2021    Assessment & Plan   1. Valvular heart disease:  Previously evaluated by Dr. Cyndia Bent, who recommended ongoing monitoring. Most recent echo in 04/2022 showed EF 60 to 65%, normal LV function, no RWMA, mild LVH, G2 DD, normal RV systolic function, moderate mitral valve regurgitation, mild to moderate aortic  stenosis, mean gradient 18.1 mmHg.  Murmur noted on exam.  Asymptomatic.  Will schedule repeat echo for 04/2023.  2. Palpitations/PACs/atrial tachycardia: Cardiac monitor in 05/2021 showed predominantly sinus rhythm, occasional PACs, 20 runs of atrial tachycardia, symptoms are associated with PACs and PVCs.  Denies any recent palpitations.  Stable on metoprolol.   3. Hypertension: BP well controlled. Continue current antihypertensive regimen.   4. Hyperlipidemia: LDL was 77 in 06/2021.  She is due for repeat labs per PCP.  Continue Crestor.  5. OSA: Not on CPAP. She does note am headaches. Has follow-up scheduled with Neurology.   6. Memory loss: She notes ongoing memory loss.  She is scheduled to see neurology in May 2024.  7. Disposition: Follow-up in 1 year.       Lenna Sciara, NP 07/16/2022, 8:31 AM

## 2022-07-16 NOTE — Patient Instructions (Signed)
Medication Instructions:  Your physician recommends that you continue on your current medications as directed. Please refer to the Current Medication list given to you today.  *If you need a refill on your cardiac medications before your next appointment, please call your pharmacy*   Lab Work: NONE ordered at this time of appointment   If you have labs (blood work) drawn today and your tests are completely normal, you will receive your results only by: [place] (if you have MyChart) OR A paper copy in the mail If you have any lab test that is abnormal or we need to change your treatment, we will call you to review the results.   Testing/Procedures: Your physician has requested that you have an echocardiogram 04/2023. Echocardiography is a painless test that uses sound waves to create images of your heart. It provides your doctor with information about the size and shape of your heart and how well your heart's chambers and valves are working. This procedure takes approximately one hour. There are no restrictions for this procedure. Please do NOT wear cologne, perfume, aftershave, or lotions (deodorant is allowed). Please arrive 15 minutes prior to your appointment time.    Follow-Up: At [facility], you and your health needs are our priority.  As part of our continuing mission to provide you with exceptional heart care, we have created designated Provider Care Teams.  These Care Teams include your primary Cardiologist (physician) and Advanced Practice Providers (APPs -  Physician Assistants and Nurse Practitioners) who all work together to provide you with the care you need, when you need it.  We recommend signing up for the patient portal called "MyChart".  Sign up information is provided on this After Visit Summary.  MyChart is used to connect with patients for Virtual Visits (Telemedicine).  Patients are able to view lab/test results, encounter notes, upcoming appointments,  etc.  Non-urgent messages can be sent to your provider as well.   To learn more about what you can do with MyChart, go to [email or web address].    Your next appointment:   1 year(s)  Provider:   Glenetta Hew, MD     Other Instructions

## 2022-07-17 ENCOUNTER — Ambulatory Visit: Payer: PPO | Admitting: Nurse Practitioner

## 2022-08-21 ENCOUNTER — Ambulatory Visit
Admission: RE | Admit: 2022-08-21 | Discharge: 2022-08-21 | Disposition: A | Discharge status: Home / Self Care | Payer: PPO | Source: Ambulatory Visit | Attending: Emergency Medicine | Admitting: Emergency Medicine

## 2022-08-21 DIAGNOSIS — Z1231 Encounter for screening mammogram for malignant neoplasm of breast: Secondary | ICD-10-CM

## 2022-08-24 ENCOUNTER — Telehealth: Payer: Self-pay

## 2022-08-24 NOTE — Telephone Encounter (Signed)
Contacted Lisa Miles to schedule their annual wellness visit. Appointment made for 09/02/22.  Lisa Miles, CMA (AAMA)  [facility] [phone]

## 2022-09-02 ENCOUNTER — Telehealth: Payer: Self-pay

## 2022-09-02 ENCOUNTER — Ambulatory Visit (INDEPENDENT_AMBULATORY_CARE_PROVIDER_SITE_OTHER): Payer: PPO

## 2022-09-02 VITALS — Ht 66.0 in | Wt 203.0 lb

## 2022-09-02 DIAGNOSIS — Z0000 Encounter for general adult medical examination without abnormal findings: Secondary | ICD-10-CM | POA: Diagnosis not present

## 2022-09-02 DIAGNOSIS — N6310 Unspecified lump in the right breast, unspecified quadrant: Secondary | ICD-10-CM

## 2022-09-02 NOTE — Telephone Encounter (Signed)
Called pt no answer LMOM ref for diagnostic mammograms has been placed, will need OV for something for her nerves.[name] KitchenRaechel Chute

## 2022-09-02 NOTE — Patient Instructions (Signed)
Lisa Miles , Thank you for taking time to come for your Medicare Wellness Visit. I appreciate your ongoing commitment to your health goals. Please review the following plan we discussed and let me know if I can assist you in the future.   These are the goals we discussed:  Goals      Client understands the importance of follow-up with providers by attending scheduled visits to evaluate any medical issues.        This is a list of the screening recommended for you and due dates:  Health Maintenance  Topic Date Due   Mammogram  09/27/2021   COVID-19 Vaccine (5 - 2023-24 season) 12/26/2021   Zoster (Shingles) Vaccine (1 of 2) 10/07/2022*   Flu Shot  11/26/2022   DTaP/Tdap/Td vaccine (2 - Td or Tdap) 03/29/2023   Medicare Annual Wellness Visit  09/02/2023   Colon Cancer Screening  02/19/2032   Pneumonia Vaccine  Completed   DEXA scan (bone density measurement)  Completed   Hepatitis C Screening: USPSTF Recommendation to screen - Ages 31-79 yo.  Completed   HPV Vaccine  Aged Out  *Topic was postponed. The date shown is not the original due date.    Advanced directives: No; Advance directive discussed with you today. Even though you declined this today please call our office should you change your mind and we can give you the proper paperwork for you to fill out.  Conditions/risks identified: Yes  Next appointment: Follow up in one year for your annual wellness visit.   Preventive Care 72 Years and Older, Female Preventive care refers to lifestyle choices and visits with your health care provider that can promote health and wellness. What does preventive care include? A yearly physical exam. This is also called an annual well check. Dental exams once or twice a year. Routine eye exams. Ask your health care provider how often you should have your eyes checked. Personal lifestyle choices, including: Daily care of your teeth and gums. Regular physical activity. Eating a healthy  diet. Avoiding tobacco and drug use. Limiting alcohol use. Practicing safe sex. Taking low-dose aspirin every day. Taking vitamin and mineral supplements as recommended by your health care provider. What happens during an annual well check? The services and screenings done by your health care provider during your annual well check will depend on your age, overall health, lifestyle risk factors, and family history of disease. Counseling  Your health care provider may ask you questions about your: Alcohol use. Tobacco use. Drug use. Emotional well-being. Home and relationship well-being. Sexual activity. Eating habits. History of falls. Memory and ability to understand (cognition). Work and work Astronomer. Reproductive health. Screening  You may have the following tests or measurements: Height, weight, and BMI. Blood pressure. Lipid and cholesterol levels. These may be checked every 5 years, or more frequently if you are over 32 years old. Skin check. Lung cancer screening. You may have this screening every year starting at age 9 if you have a 30-pack-year history of smoking and currently smoke or have quit within the past 15 years. Fecal occult blood test (FOBT) of the stool. You may have this test every year starting at age 72. Flexible sigmoidoscopy or colonoscopy. You may have a sigmoidoscopy every 5 years or a colonoscopy every 10 years starting at age 72. Hepatitis C blood test. Hepatitis B blood test. Sexually transmitted disease (STD) testing. Diabetes screening. This is done by checking your blood sugar (glucose) after you have not eaten  for a while (fasting). You may have this done every 72 years. Bone density scan. This is done to screen for osteoporosis. You may have this done starting at age 72. Mammogram. This may be done every 1-2 years. Talk to your health care provider about how often you should have regular mammograms. Talk with your health care provider about  your test results, treatment options, and if necessary, the need for more tests. Vaccines  Your health care provider may recommend certain vaccines, such as: Influenza vaccine. This is recommended every year. Tetanus, diphtheria, and acellular pertussis (Tdap, Td) vaccine. You may need a Td booster every 10 years. Zoster vaccine. You may need this after age 20. Pneumococcal 13-valent conjugate (PCV13) vaccine. One dose is recommended after age 42. Pneumococcal polysaccharide (PPSV23) vaccine. One dose is recommended after age 72. Talk to your health care provider about which screenings and vaccines you need and how often you need them. This information is not intended to replace advice given to you by your health care provider. Make sure you discuss any questions you have with your health care provider. Document Released: 05/10/2015 Document Revised: 01/01/2016 Document Reviewed: 02/12/2015 Elsevier Interactive Patient Education  2017 [employer].  Fall Prevention in the Home Falls can cause injuries. They can happen to people of all ages. There are many things you can do to make your home safe and to help prevent falls. What can I do on the outside of my home? Regularly fix the edges of walkways and driveways and fix any cracks. Remove anything that might make you trip as you walk through a door, such as a raised step or threshold. Trim any bushes or trees on the path to your home. Use bright outdoor lighting. Clear any walking paths of anything that might make someone trip, such as rocks or tools. Regularly check to see if handrails are loose or broken. Make sure that both sides of any steps have handrails. Any raised decks and porches should have guardrails on the edges. Have any leaves, snow, or ice cleared regularly. Use sand or salt on walking paths during winter. Clean up any spills in your garage right away. This includes oil or grease spills. What can I do in the bathroom? Use  night lights. Install grab bars by the toilet and in the tub and shower. Do not use towel bars as grab bars. Use non-skid mats or decals in the tub or shower. If you need to sit down in the shower, use a plastic, non-slip stool. Keep the floor dry. Clean up any water that spills on the floor as soon as it happens. Remove soap buildup in the tub or shower regularly. Attach bath mats securely with double-sided non-slip rug tape. Do not have throw rugs and other things on the floor that can make you trip. What can I do in the bedroom? Use night lights. Make sure that you have a light by your bed that is easy to reach. Do not use any sheets or blankets that are too big for your bed. They should not hang down onto the floor. Have a firm chair that has side arms. You can use this for support while you get dressed. Do not have throw rugs and other things on the floor that can make you trip. What can I do in the kitchen? Clean up any spills right away. Avoid walking on wet floors. Keep items that you use a lot in easy-to-reach places. If you need to reach something  above you, use a strong step stool that has a grab bar. Keep electrical cords out of the way. Do not use floor polish or wax that makes floors slippery. If you must use wax, use non-skid floor wax. Do not have throw rugs and other things on the floor that can make you trip. What can I do with my stairs? Do not leave any items on the stairs. Make sure that there are handrails on both sides of the stairs and use them. Fix handrails that are broken or loose. Make sure that handrails are as long as the stairways. Check any carpeting to make sure that it is firmly attached to the stairs. Fix any carpet that is loose or worn. Avoid having throw rugs at the top or bottom of the stairs. If you do have throw rugs, attach them to the floor with carpet tape. Make sure that you have a light switch at the top of the stairs and the bottom of the  stairs. If you do not have them, ask someone to add them for you. What else can I do to help prevent falls? Wear shoes that: Do not have high heels. Have rubber bottoms. Are comfortable and fit you well. Are closed at the toe. Do not wear sandals. If you use a stepladder: Make sure that it is fully opened. Do not climb a closed stepladder. Make sure that both sides of the stepladder are locked into place. Ask someone to hold it for you, if possible. Clearly mark and make sure that you can see: Any grab bars or handrails. First and last steps. Where the edge of each step is. Use tools that help you move around (mobility aids) if they are needed. These include: Canes. Walkers. Scooters. Crutches. Turn on the lights when you go into a dark area. Replace any light bulbs as soon as they burn out. Set up your furniture so you have a clear path. Avoid moving your furniture around. If any of your floors are uneven, fix them. If there are any pets around you, be aware of where they are. Review your medicines with your doctor. Some medicines can make you feel dizzy. This can increase your chance of falling. Ask your doctor what other things that you can do to help prevent falls. This information is not intended to replace advice given to you by your health care provider. Make sure you discuss any questions you have with your health care provider. Document Released: 02/07/2009 Document Revised: 09/19/2015 Document Reviewed: 05/18/2014 Elsevier Interactive Patient Education  2017 [employer].

## 2022-09-02 NOTE — Addendum Note (Signed)
Addended by: Deatra James on: 09/02/2022 01:58 PM   Modules accepted: Orders

## 2022-09-02 NOTE — Telephone Encounter (Signed)
Okay to order diagnostic mammogram as requested.  Thanks.

## 2022-09-02 NOTE — Progress Notes (Signed)
I connected with  Lisa Miles on 09/02/22 by a audio enabled telemedicine application and verified that I am speaking with the correct [place] using two identifiers.  Patient Location: Home  Provider Location: Office/Clinic  I discussed the limitations of evaluation and management by telemedicine. The patient expressed understanding and agreed to proceed.  Subjective:   Lisa Miles is a 72 y.o. female who presents for Medicare Annual (Subsequent) preventive examination.  Review of Systems     Cardiac Risk Factors include: advanced age (>23men, >64 women);dyslipidemia;hypertension;family history of premature cardiovascular disease;obesity (BMI >30kg/m2);sedentary lifestyle     Objective:    Today's Vitals   09/02/22 1118  Weight: 203 lb (92.1 kg)  Height: 5\' 6"  (1.676 m)  PainSc: 0-No pain   Body mass index is 32.77 kg/m.     09/02/2022   11:21 AM 09/12/2021    2:12 PM 03/28/2020    6:02 AM 03/13/2020    8:19 AM 11/08/2019   11:08 AM 11/07/2018    1:12 PM 10/17/2014   12:29 AM  Advanced Directives  Does Patient Have a Medical Advance Directive? Yes Yes Yes Yes No No No  Type of Estate agent of [place];Living will Healthcare Power of [place];Living will Living will Living will     Does patient want to make changes to medical advance directive?   No - Patient declined      Copy of Healthcare Power of Attorney in Chart? No - copy requested No - copy requested       Would patient like information on creating a medical advance directive?     Yes (ED - Information included in AVS) No - Patient declined No - patient declined information    Current Medications (verified) Outpatient Encounter Medications as of 09/02/2022  Medication Sig   amoxicillin (AMOXIL) 500 MG tablet Take 500 mg by mouth. Take 4 tablets before dental procedures.   aspirin EC 81 MG tablet Take 81 mg by mouth daily. Swallow whole.   chlorthalidone (HYGROTON) 25 MG tablet Take 1/2  tablet of 25 mg Chlorthalidone  by mouth daily   Cholecalciferol (VITAMIN D) 50 MCG (2000 UT) CAPS Take 2,000 Units by mouth daily.   esomeprazole (NEXIUM) 40 MG capsule TAKE 1 CAPSULE BY MOUTH TWICE DAILY BEFORE A MEAL   estradiol (ESTRACE) 2 MG tablet Take 2 mg by mouth daily.   hydroxypropyl methylcellulose / hypromellose (ISOPTO TEARS / GONIOVISC) 2.5 % ophthalmic solution Place 1 drop into both eyes in the morning and at bedtime.   ibuprofen (ADVIL) 600 MG tablet Take 600 mg by mouth every 8 (eight) hours as needed.   metoprolol succinate (TOPROL XL) 25 MG 24 hr tablet Take 1 tablet (25 mg total) by mouth daily.   rosuvastatin (CRESTOR) 10 MG tablet TAKE 1 TABLET(10 MG) BY MOUTH DAILY   traMADol (ULTRAM) 50 MG tablet Take 50 mg by mouth every 6 (six) hours as needed.   valsartan (DIOVAN) 320 MG tablet TAKE 1 TABLET(320 MG) BY MOUTH DAILY   No facility-administered encounter medications on file as of 09/02/2022.    Allergies (verified) Patient has no known allergies.   History: Past Medical History:  Diagnosis Date   Aortic stenosis, mild-moderate 07/2012   TTE October 21: Moderate aortic valve thickening with mild to moderate AI & mild AS; TEE: mild to moderate AI & MIld AS   Arthritis    Phreesia 11/06/2019   Asthma    Bilateral bunions    Bunionectomies performed  Bronchitis, chronic (HCC)    Cataract    Phreesia 11/06/2019   Heart disease    Hyperlipidemia    Hypertension    Good control   Inguinodynia    Bilateral groin pain   Lower extremity edema    Chronic. Venous Duplex 11/06/11 SUMMARY: 1) Bilateral Lower Extremities: No evidence of DVT or thrombophlebitis.  2) Right Common Femoral Vein: Demonstrated mild valvular insufficiency with a greater than (1) sec of duration. Mildly abnormal LE Venous duplex Doppler.   Obesity    Palpitations    Relatively well controlled   Scoliosis    DG Chest 2 View x-ray on 07/30/11 by Dr. Cleta Alberts shows a scoliosis.   Severe mitral  regurgitation by prior echocardiogram 07/2012   Mild AMVL prolapse, Mod MR -- no MVP noted in 01/2018 -> 11/'21: Severe MR due to restricted movement of P3 scallop of PMVL (IIIB) due to incomplete leaflet coaptation -> thickened leaflets consistent with Rheumatic Heart Valve Disease.   Past Surgical History:  Procedure Laterality Date   ABDOMINAL HYSTERECTOMY     ANKLE ARTHROSCOPY Left    BUNIONECTOMY Bilateral    CARDIAC EVENT MONITOR  03/2020   (04/05/2020 -05/04/2020): Mostly NSR.  Heart rate range 42-148 bpm.  3 short bursts of 4-5 beats NSVT (asymptomatic).  116 beat run of PAT.  Rare PACs and PVCs.  Some bigeminy and trigeminy.   CARDIOPULMONARY EXERCISE TEST (CPX)  05/09/2020   (done with Ex St Echo): Normal Functional Capacity. No indication for CP limitations.  Body Habitus contributes to Exercise Intolerance.   EXERCISE STRESS ECHO  05/09/2020   TTE shows normal EF 65% with LVH No pericardial effusion Normal RV size and function Moderate appearing rheumatic MR/AR.;; -- NO COMMENT ON EXERCISE EFFECT ON MR!!! (even though this was the reason for ordering the test)   EYE SURGERY N/A    Phreesia 11/06/2019   KNEE ARTHROSCOPY Right    Left knee open surgery     pt thinks she only had shots in L knee, not surgery   RIGHT/LEFT HEART CATH AND CORONARY ANGIOGRAPHY N/A 03/28/2020   RIGHT/LEFT HEART CATH AND CORONARY ANGIOGRAPHY;  Marykay Lex, MD;  Location: [facility] INVASIVE CV LAB; angiographically normal coronary arteries.  Cloacal LM. Normal RHC Pressures (PAP 40/13 - mean 22 mmHg, PCWP , V wave 25 mmHg c/w MR, LVEDP 15 mmHg); CO/CI 5.02 / 2.45 (reduced).   TEE WITHOUT CARDIOVERSION N/A 03/13/2020   Procedure: TRANSESOPHAGEAL ECHOCARDIOGRAM (TEE);  Surgeon: Sande Rives, MD;  [facility] ENDOSCOPY;;; Severe MR 2/2 restricted P3 scallop of PMVL (IIIB) w/ incomplete leaflet coaptation.  Thickened /degenerative leaflets c/w Rheumatic Valvular Heart Disease.  2D PISA radius 0.9 cm with 2D ERO  0.26 cm2 with R Vol 62 cc. PV blunting w/ BP 109/60 mmHg. No MS. Mild-mod AI / Mild AS. Gr 2 plaque Aorta   TOTAL ABDOMINAL HYSTERECTOMY     TRANSTHORACIC ECHOCARDIOGRAM  05/09/2021   EF 60 to 65%.  No RWMA.  GR 1 DD with elevated LAP-moderately dilated left atrium (LA).  Normal RV size and function.  Moderate to severe MR with no MS.  Moderate aortic calcification/sclerosis with only mild stenosis.  Ascending aorta 39 mm.   TRANSTHORACIC ECHOCARDIOGRAM  01/2018   a) Normal LV size.  Mod Conc LVH.  EF 60-65%.  No R WMA.  GR 1 DD (high P).  Mild AS (MG 16 mmHg) w/ Mod AI.  Severe LA dilation.  Mild MR.; 01/2020:  EF 65-70%. No  RWMA.  Severe MR w/ restricted PMVL movement.  (Recommend TEE).  Mod AoV thickening w/ mild to mod AS & Mod AI.  Nl RV fxn.  Mildly elevated filling P. Severe LA dilation.   Zio Patch Monitor  05/2021   Predominant Rhythm Is Sinus Rhythm - HR range 45 -139 bpm & avg 74 bpm.  Occ PACs (2.9%) w/ some couplets and triplets,& rare PVCs. 20 Atrial Runs: Fastest - 6 beats w/ rate 235 bpm; Longest 12 beats @ 138 bpm (~ 5.2 sec); Symptoms were noted with PACs and PVCs not with atrial tachycardia runs.   Family History  Problem Relation Age of Onset   Arthritis Mother    Hyperlipidemia Mother    Diabetes Mother    Gout Mother    Hypertension Mother    Cancer Father        GI cancer   Diabetes Maternal Grandfather    Hypertension Brother    Diabetes Brother    Hypertension Brother    Migraines Neg Hx    Headache Neg Hx    Social History   Socioeconomic History   Marital status: Married    Spouse name: Molly Maduro   Number of children: 2   Years of education: Not on file   Highest education level: Not on file  Occupational History   Occupation: production    Employer: Publishing rights manager    Comment: check printing/shipping  Tobacco Use   Smoking status: Never   Smokeless tobacco: Never  Vaping Use   Vaping Use: Never used  Substance and Sexual Activity   Alcohol use: No    Drug use: No   Sexual activity: Never  Other Topics Concern   Not on file  Social History Narrative   Lives with her husband.  Their children live nearby.   Right handed   Caffeine: 1 cup/day   Social Determinants of Health   Financial Resource Strain: Low Risk  (09/02/2022)   Overall Financial Resource Strain (CARDIA)    Difficulty of Paying Living Expenses: Not hard at all  Food Insecurity: No Food Insecurity (09/02/2022)   Hunger Vital Sign    Worried About Running Out of Food in the Last Year: Never true    Ran Out of Food in the Last Year: Never true  Transportation Needs: No Transportation Needs (09/02/2022)   PRAPARE - Administrator, Civil Service (Medical): No    Lack of Transportation (Non-Medical): No  Physical Activity: Insufficiently Active (09/02/2022)   Exercise Vital Sign    Days of Exercise per Week: 3 days    Minutes of Exercise per Session: 20 min  Stress: Stress Concern Present (09/02/2022)   [employer] of Occupational Health - Occupational Stress Questionnaire    Feeling of Stress : To some extent  Social Connections: Moderately Isolated (09/02/2022)   Social Connection and Isolation Panel [NHANES]    Frequency of Communication with Friends and Family: Three times a week    Frequency of Social Gatherings with Friends and Family: Three times a week    Attends Religious Services: Never    Active Member of Clubs or Organizations: No    Attends Banker Meetings: Never    Marital Status: Married    Tobacco Counseling Counseling given: Not Answered   Clinical Intake:  Pre-visit preparation completed: Yes  Pain : No/denies pain Pain Score: 0-No pain     BMI - recorded: 32.77 Nutritional Status: BMI > 30  Obese Nutritional Risks: None Diabetes:  No  How often do you need to have someone help you when you read instructions, pamphlets, or other written materials from your doctor or pharmacy?: 1 - Never What is the last grade level  you completed in school?: HSG  Diabetic? No  Interpreter Needed?: No  Information entered by :: Susie Cassette, LPN.   Activities of Daily Living    09/02/2022   11:39 AM 09/12/2021    2:13 PM  In your present state of health, do you have any difficulty performing the following activities:  Hearing? 0 0  Vision? 0 0  Difficulty concentrating or making decisions? 0 0  Walking or climbing stairs? 0 0  Dressing or bathing? 0 0  Doing errands, shopping? 0 0  Preparing Food and eating ? N N  Using the Toilet? N N  In the past six months, have you accidently leaked urine? N N  Do you have problems with loss of bowel control? N N  Managing your Medications? N N  Managing your Finances? N N  Housekeeping or managing your Housekeeping? N N    Patient Care Team: Georgina Quint, MD as PCP - General (Internal Medicine) Marykay Lex, MD as PCP - Cardiology (Cardiology) Marykay Lex, MD as Consulting Physician (Cardiology) Freddy Finner, MD (Inactive) as Consulting Physician (Obstetrics and Gynecology)  Indicate any recent Medical Services you may have received from other than [facility] providers in the past year (date may be approximate).     Assessment:   This is a routine wellness examination for Lisa Miles.  Hearing/Vision screen Hearing Screening - Comments:: Denies hearing difficulties   Vision Screening - Comments:: Wears rx glasses - up to date with routine eye exams with [facility]   Dietary issues and exercise activities discussed: Current Exercise Habits: Home exercise routine, Type of exercise: walking, Time (Minutes): 20, Frequency (Times/Week): 3, Weekly Exercise (Minutes/Week): 60, Intensity: Moderate, Exercise limited by: cardiac condition(s);respiratory conditions(s);orthopedic condition(s)   Goals Addressed             This Visit's Progress    Client understands the importance of follow-up with providers by attending scheduled visits to  evaluate any medical issues.        Depression Screen    09/02/2022   11:23 AM 07/07/2022   10:16 AM 09/12/2021    2:13 PM 09/12/2021    2:11 PM 09/12/2021    2:10 PM 07/02/2021   11:11 AM 02/20/2021    2:40 PM  PHQ 2/9 Scores  PHQ - 2 Score 0 0 0 0 0 0 0  PHQ- 9 Score 0          Fall Risk    09/02/2022   11:23 AM 07/07/2022   10:16 AM 09/12/2021    2:14 PM 07/02/2021   11:11 AM 02/20/2021    2:40 PM  Fall Risk   Falls in the past year? 0 0 0 0 0  Number falls in past yr: 0 0 0 0 0  Injury with Fall? 0 0 0 0 0  Risk for fall due to : No Fall Risks No Fall Risks     Follow up Falls prevention discussed Falls evaluation completed Falls evaluation completed      FALL RISK PREVENTION PERTAINING TO THE HOME:  Any stairs in or around the home? Yes  to attic If so, are there any without handrails? No  Home free of loose throw rugs in walkways, pet beds, electrical cords, etc? Yes  Adequate  lighting in your home to reduce risk of falls? Yes   ASSISTIVE DEVICES UTILIZED TO PREVENT FALLS:  Life alert? No  Use of a cane, walker or w/c? No  Grab bars in the bathroom? No  Shower chair or bench in shower? No  Elevated toilet seat or a handicapped toilet? No   TIMED UP AND GO:  Was the test performed? No . Telephonic Visit  Cognitive Function:        09/02/2022   11:40 AM 11/08/2019   11:04 AM 11/07/2018    1:22 PM 11/07/2018    1:20 PM  6CIT Screen  What Year? 0 points 0 points 0 points 0 points  What month? 0 points 0 points 0 points 0 points  What time? 0 points 0 points 0 points 0 points  Count back from 20 0 points 0 points 0 points 0 points  Months in reverse 0 points 0 points 0 points 0 points  Repeat phrase 0 points 0 points 0 points   Total Score 0 points 0 points 0 points     Immunizations Immunization History  Administered Date(s) Administered   Fluad Quad(high Dose 65+) 01/06/2019, 02/20/2021, 07/07/2022   Influenza Whole 03/27/2009   Influenza, High Dose  Seasonal PF 04/01/2018   Influenza,inj,Quad PF,6+ Mos 03/15/2012, 03/28/2013, 07/22/2016   Moderna Sars-Covid-2 Vaccination 06/10/2019, 07/08/2019, 02/27/2020, 09/02/2020   Pneumococcal Conjugate-13 07/22/2016   Pneumococcal Polysaccharide-23 04/02/2009, 09/22/2018   Tdap 03/28/2013    TDAP status: Up to date  Flu Vaccine status: Up to date  Pneumococcal vaccine status: Up to date  Covid-19 vaccine status: Completed vaccines  Qualifies for Shingles Vaccine? Yes   Zostavax completed No   Shingrix Completed?: No.    Education has been provided regarding the importance of this vaccine. Patient has been advised to call insurance company to determine out of pocket expense if they have not yet received this vaccine. Advised may also receive vaccine at local pharmacy or Health Dept. Verbalized acceptance and understanding.  Screening Tests Health Maintenance  Topic Date Due   MAMMOGRAM  09/27/2021   COVID-19 Vaccine (5 - 2023-24 season) 12/26/2021   Zoster Vaccines- Shingrix (1 of 2) 10/07/2022 (Originally 01/22/2001)   INFLUENZA VACCINE  11/26/2022   DTaP/Tdap/Td (2 - Td or Tdap) 03/29/2023   Medicare Annual Wellness (AWV)  09/02/2023   COLONOSCOPY (Pts 45-60yrs Insurance coverage will need to be confirmed)  02/19/2032   Pneumonia Vaccine 11+ Years old  Completed   DEXA SCAN  Completed   Hepatitis C Screening  Completed   HPV VACCINES  Aged Out    Health Maintenance  Health Maintenance Due  Topic Date Due   MAMMOGRAM  09/27/2021   COVID-19 Vaccine (5 - 2023-24 season) 12/26/2021    Colorectal cancer screening: Type of screening: Colonoscopy. Completed 02/18/2022. Repeat every 10 years  Mammogram status: Completed 09/27/2020. Repeat every year Patient is overdue to mammogram.  PCP to order diagnostic mammogram for possible lump in right breast.  Bone Density scan: Never done  Lung Cancer Screening: (Low Dose CT Chest recommended if Age 53-80 years, 30 pack-year currently  smoking OR have quit w/in 15years.) does not qualify.   Lung Cancer Screening Referral: no  Additional Screening:  Hepatitis C Screening: does qualify; Completed 07/22/2016  Vision Screening: Recommended annual ophthalmology exams for early detection of glaucoma and other disorders of the eye. Is the patient up to date with their annual eye exam?  Yes  Who is the provider or what is the  name of the office in which the patient attends annual eye exams? [facility] If pt is not established with a provider, would they like to be referred to a provider to establish care? No .   Dental Screening: Recommended annual dental exams for proper oral hygiene  Community Resource Referral / Chronic Care Management: CRR required this visit?  No   CCM required this visit?  No      Plan:     I have personally reviewed and noted the following in the patient's chart:   Medical and social history Use of alcohol, tobacco or illicit drugs  Current medications and supplements including opioid prescriptions. Patient is not currently taking opioid prescriptions. Functional ability and status Nutritional status Physical activity Advanced directives List of other physicians Hospitalizations, surgeries, and ER visits in previous 12 months Vitals Screenings to include cognitive, depression, and falls Referrals and appointments  In addition, I have reviewed and discussed with patient certain preventive protocols, quality metrics, and best practice recommendations. A written personalized care plan for preventive services as well as general preventive health recommendations were provided to patient.     Mickeal Needy, LPN   05/02/1094   Nurse Notes:  Normal cognitive status assessed by direct observation via telephone conversation by this Nurse Health Advisor. No abnormalities found.

## 2022-09-02 NOTE — Telephone Encounter (Signed)
Patient is requesting a diagnostic mammogram due to possible right breast mass/nodule.  Patient stated that there is no pain in the breast nor nipple drainage.  She is overdue for a mammogram, which was last done 09/27/2020.  Can we send an order for diagnostic mammogram to [facility] for evaluation.  Also patient is having some stress issues and wanted to know if there was anything that she could take to help calm her nerves.  Zollie Ellery N. Tramar Brueckner, LPN. [facility] AWV Team Direct Dial: [phone]

## 2022-09-08 DIAGNOSIS — R519 Headache, unspecified: Secondary | ICD-10-CM | POA: Diagnosis not present

## 2022-09-08 DIAGNOSIS — Z6834 Body mass index (BMI) 34.0-34.9, adult: Secondary | ICD-10-CM | POA: Diagnosis not present

## 2022-09-08 DIAGNOSIS — Z01419 Encounter for gynecological examination (general) (routine) without abnormal findings: Secondary | ICD-10-CM | POA: Diagnosis not present

## 2022-09-14 ENCOUNTER — Other Ambulatory Visit: Payer: Self-pay | Admitting: Emergency Medicine

## 2022-09-14 DIAGNOSIS — N6310 Unspecified lump in the right breast, unspecified quadrant: Secondary | ICD-10-CM

## 2022-09-16 ENCOUNTER — Other Ambulatory Visit: Payer: Self-pay | Admitting: Neurology

## 2022-09-16 ENCOUNTER — Encounter: Payer: Self-pay | Admitting: Neurology

## 2022-09-16 ENCOUNTER — Ambulatory Visit (INDEPENDENT_AMBULATORY_CARE_PROVIDER_SITE_OTHER): Payer: PPO | Admitting: Neurology

## 2022-09-16 DIAGNOSIS — R569 Unspecified convulsions: Secondary | ICD-10-CM

## 2022-09-16 DIAGNOSIS — R5383 Other fatigue: Secondary | ICD-10-CM

## 2022-09-16 DIAGNOSIS — E559 Vitamin D deficiency, unspecified: Secondary | ICD-10-CM

## 2022-09-16 DIAGNOSIS — R404 Transient alteration of awareness: Secondary | ICD-10-CM

## 2022-09-16 DIAGNOSIS — E538 Deficiency of other specified B group vitamins: Secondary | ICD-10-CM

## 2022-09-16 DIAGNOSIS — R7309 Other abnormal glucose: Secondary | ICD-10-CM | POA: Diagnosis not present

## 2022-09-16 DIAGNOSIS — E519 Thiamine deficiency, unspecified: Secondary | ICD-10-CM

## 2022-09-16 DIAGNOSIS — R42 Dizziness and giddiness: Secondary | ICD-10-CM

## 2022-09-16 DIAGNOSIS — R002 Palpitations: Secondary | ICD-10-CM

## 2022-09-16 DIAGNOSIS — I491 Atrial premature depolarization: Secondary | ICD-10-CM

## 2022-09-16 NOTE — Patient Instructions (Addendum)
EEG here in the office then 3-day ambulatory eeg - call MRI brain seizure and stroke protocol - call 30- day cardiac monitor - call Labs today 4 month follow up    Per [facility] statutes, patients with seizures are not allowed to drive until they have been seizure-free for six months.    Use caution when using heavy equipment or power tools. Avoid working on ladders or at heights. Take showers instead of baths. Ensure the water temperature is not too high on the home water heater. Do not go swimming alone. Do not lock yourself in a room alone (i.e. bathroom). When caring for infants or small children, sit down when holding, feeding, or changing them to minimize risk of injury to the child in the event you have a seizure. Maintain good sleep hygiene. Avoid alcohol.    If patient has another seizure, call 911 and bring them back to the ED if: A.  The seizure lasts longer than 5 minutes.      B.  The patient doesn't wake shortly after the seizure or has new problems such as difficulty seeing, speaking or moving following the seizure C.  The patient was injured during the seizure D.  The patient has a temperature over 102 F (39C) E.  The patient vomited during the seizure and now is having trouble breathing  Per [facility] statutes, patients with seizures are not allowed to drive until they have been seizure-free for six months.  Other recommendations include using caution when using heavy equipment or power tools. Avoid working on ladders or at heights. Take showers instead of baths.  Do not swim alone.  Ensure the water temperature is not too high on the home water heater. Do not go swimming alone. Do not lock yourself in a room alone (i.e. bathroom). When caring for infants or small children, sit down when holding, feeding, or changing them to minimize risk of injury to the child in the event you have a seizure. Maintain good sleep hygiene. Avoid alcohol.  Also recommend adequate  sleep, hydration, good diet and minimize stress.  During the Seizure  - First, ensure adequate ventilation and place patients on the floor on their left side  Loosen clothing around the neck and ensure the airway is patent. If the patient is clenching the teeth, do not force the mouth open with any object as this can cause severe damage - Remove all items from the surrounding that can be hazardous. The patient may be oblivious to what's happening and may not even know what he or she is doing. If the patient is confused and wandering, either gently guide him/her away and block access to outside areas - Reassure the individual and be comforting - Call 911. In most cases, the seizure ends before EMS arrives. However, there are cases when seizures may last over 3 to 5 minutes. Or the individual may have developed breathing difficulties or severe injuries. If a pregnant patient or a [place] with diabetes develops a seizure, it is prudent to call an ambulance. - Finally, if the patient does not regain full consciousness, then call EMS. Most patients will remain confused for about 45 to 90 minutes after a seizure, so you must use judgment in calling for help. - Avoid restraints but make sure the patient is in a bed with padded side rails - Place the individual in a lateral position with the neck slightly flexed; this will help the saliva drain from the mouth  and prevent the tongue from falling backward - Remove all nearby furniture and other hazards from the area - Provide verbal assurance as the individual is regaining consciousness - Provide the patient with privacy if possible - Call for help and start treatment as ordered by the caregiver   fter the Seizure (Postictal Stage)  After a seizure, most patients experience confusion, fatigue, muscle pain and/or a headache. Thus, one should permit the individual to sleep. For the next few days, reassurance is essential. Being calm and helping reorient the  [place] is also of importance.  Most seizures are painless and end spontaneously. Seizures are not harmful to others but can lead to complications such as stress on the lungs, brain and the heart. Individuals with prior lung problems may develop labored breathing and respiratory distress.   Seizure, Adult A seizure is a sudden burst of abnormal electrical and chemical activity in the brain. Seizures usually last from 30 seconds to 2 minutes. The abnormal activity temporarily interrupts normal brain function. Many types of seizures can affect adults. A seizure can cause many different symptoms depending on where in the brain it starts. What are the causes? Common causes of this condition include: Fever or infection. Brain injury, head trauma, bleeding in the brain, or a brain tumor. Low levels of blood sugar or salt (sodium). Kidney problems or liver problems. Metabolic disorders or other conditions that are passed from parent to child (are inherited). Reaction to a substance, such as a drug or a medicine, or suddenly stopping the use of a substance (withdrawal). A stroke. Developmental disorders such as autism spectrum disorder or cerebral palsy. In some cases, the cause of a seizure may not be known. Some people who have a seizure never have another one. A [place] who has repeated seizures over time without a clear cause has a condition called epilepsy. What increases the risk? You are more likely to develop this condition if: You have a family history of epilepsy. You have had a tonic-clonic seizure before. This type of seizure causes tightening (contraction) of the muscles of the whole body and loss of consciousness. You have a history of head trauma, lack of oxygen at birth, or strokes. What are the signs or symptoms? There are many different types of seizures. The symptoms vary depending on the type of seizure you have. Symptoms occur during the seizure. They may also occur before a  seizure (aura) and after a seizure (postictal). Symptoms may include the following: Symptoms during a seizure Uncontrollable shaking (convulsions) with fast, jerky movements of muscles. Stiffening of the body. Breathing problems. Confusion, staring, or unresponsiveness. Head nodding, eye blinking or fluttering, or rapid eye movements. Drooling, grunting, or making clicking sounds with your mouth. Loss of bladder control and bowel control. Symptoms before a seizure Fear or anxiety. Nausea. Vertigo. This is a feeling like: You are moving when you are not. Your surroundings are moving when they are not. Dj vu. This is a feeling of having seen or heard something before. Odd tastes or smells. Changes in vision, such as seeing flashing lights or spots. Symptoms after a seizure Confusion. Sleepiness. Headache. Sore muscles. How is this diagnosed? This condition may be diagnosed based on: A description of your symptoms. Video of your seizures can be helpful. Your medical history. A physical exam. You may also have tests, including: Blood tests. CT scan. MRI. Electroencephalogram (EEG). This test measures electrical activity in the brain. An EEG can predict whether seizures will return. A spinal  tap, also called a lumbar puncture. This is the removal and testing of fluid that surrounds the brain and spinal cord. How is this treated? Most seizures will stop on their own in less than 5 minutes, and no treatment is needed. Seizures that last longer than 5 minutes will usually need treatment. Seizures may be treated with: Medicines given through an IV. Avoiding known triggers, such as medicines that you take for another condition. Medicines to control seizures or prevent future seizures (antiepileptics), if epilepsy caused your seizures. Medical devices to prevent and control seizures. Surgery to stop seizures or to reduce how often seizures happen, if you have epilepsy that does not  respond to medicines. A diet low in carbohydrates and high in fat (ketogenic diet). Follow these instructions at home: Medicines Take over-the-counter and prescription medicines only as told by your health care provider. Avoid any substances that may prevent your medicine from working properly, such as alcohol. Activity Follow instructions about activities, such as driving or swimming, that would be dangerous if you had another seizure. Wait until your health care provider says it is safe to do them. If you live in the [place] check with your local department of motor vehicles [facility]) to find out about local driving laws. Each state has specific rules about when you can legally drive again. Get enough rest. Lack of sleep can make seizures more likely to occur. Educating others  Teach friends and family what to do if you have a seizure. They should: Help you get down to the ground, to prevent a fall. Cushion your head and move items away from your body. Loosen any tight clothing around your neck. Turn you on your side. If you vomit, this helps keep your airway clear. Know whether or not you need emergency care. Stay with you until you recover. Also, tell them what not to do if you have a seizure. Tell them: They should not hold you down. Holding you down will not stop the seizure. They should not put anything in your mouth. General instructions Avoid anything that has ever triggered a seizure for you. Keep a seizure diary. Record what you remember about each seizure, especially anything that might have triggered it. Keep all follow-up visits. This is important. Contact a health care provider if: You have another seizure or seizures. Call each time you have a seizure. Your seizure pattern changes. You continue to have seizures with treatment. You have symptoms of an infection or illness. Either of these might increase your risk of having a seizure. You are unable to take your medicine. Get  help right away if: You have: A seizure that does not stop after 5 minutes. Several seizures in a row without a complete recovery between seizures. A seizure that makes it harder to breathe. A seizure that leaves you unable to speak or use a part of your body. You do not wake up right away after a seizure. You injure yourself during a seizure. You have confusion or pain right after a seizure. These symptoms may represent a serious problem that is an emergency. Do not wait to see if the symptoms will go away. Get medical help right away. Call your local emergency services (911 in the [place] Do not drive yourself to the hospital. Summary Seizures are caused by abnormal electrical and chemical activity in the brain. The activity disrupts normal brain function and can cause various symptoms. Seizures have many causes, including illness, head injuries, low levels of blood sugar or  salt, and certain conditions. Most seizures will stop on their own in less than 5 minutes. Seizures that last longer than 5 minutes are a medical emergency and need treatment right away. Many medicines are used to treat seizures. Take over-the-counter and prescription medicines only as told by your health care provider. This information is not intended to replace advice given to you by your health care provider. Make sure you discuss any questions you have with your health care provider. Document Revised: 10/20/2019 Document Reviewed: 10/20/2019 Elsevier Patient Education  2023 [employer].
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
## 2022-09-17 ENCOUNTER — Telehealth: Payer: Self-pay | Admitting: Neurology

## 2022-09-17 LAB — VITAMIN D 25 HYDROXY (VIT D DEFICIENCY, FRACTURES): Vit D, 25-Hydroxy: 40.4 ng/mL (ref 30.0–100.0)

## 2022-09-17 LAB — COMPREHENSIVE METABOLIC PANEL
ALT: 21 IU/L (ref 0–32)
Calcium: 9.7 mg/dL (ref 8.7–10.3)
Potassium: 4.5 mmol/L (ref 3.5–5.2)

## 2022-09-17 LAB — HEMOGLOBIN A1C: Hgb A1c MFr Bld: 6 % — ABNORMAL HIGH (ref 4.8–5.6)

## 2022-09-17 LAB — CBC WITH DIFFERENTIAL/PLATELET
Basos: 0 %
Platelets: 249 10*3/uL (ref 150–450)

## 2022-09-17 NOTE — Telephone Encounter (Signed)
Healthteam adv NPR sent to GI [phone] 

## 2022-09-19 LAB — B12 AND FOLATE PANEL: Folate: 9.6 ng/mL (ref >3.0)

## 2022-09-19 LAB — COMPREHENSIVE METABOLIC PANEL
Alkaline Phosphatase: 51 IU/L (ref 44–121)
Globulin, Total: 3 g/dL (ref 1.5–4.5)
Sodium: 140 mmol/L (ref 134–144)
Total Protein: 7.1 g/dL (ref 6.0–8.5)

## 2022-09-19 LAB — CBC WITH DIFFERENTIAL/PLATELET
Basophils Absolute: 0 10*3/uL (ref 0.0–0.2)
Hemoglobin: 13.9 g/dL (ref 11.1–15.9)
MCHC: 33 g/dL (ref 31.5–35.7)
Monocytes Absolute: 0.4 10*3/uL (ref 0.1–0.9)
Monocytes: 8 %
Neutrophils: 68 %
RBC: 4.66 x10E6/uL (ref 3.77–5.28)

## 2022-09-19 LAB — VITAMIN B1

## 2022-09-19 LAB — METHYLMALONIC ACID, SERUM: Methylmalonic Acid: 335 nmol/L (ref 0–378)

## 2022-09-21 LAB — CBC WITH DIFFERENTIAL/PLATELET
EOS (ABSOLUTE): 0.1 10*3/uL (ref 0.0–0.4)
Eos: 1 %
Hematocrit: 42.1 % (ref 34.0–46.6)
Immature Grans (Abs): 0 10*3/uL (ref 0.0–0.1)
Immature Granulocytes: 0 %
Lymphocytes Absolute: 1.1 10*3/uL (ref 0.7–3.1)
Lymphs: 23 %
MCH: 29.8 pg (ref 26.6–33.0)
MCV: 90 fL (ref 79–97)
Neutrophils Absolute: 3.3 10*3/uL (ref 1.4–7.0)
RDW: 13.1 % (ref 11.7–15.4)
WBC: 4.8 10*3/uL (ref 3.4–10.8)

## 2022-09-21 LAB — COMPREHENSIVE METABOLIC PANEL
AST: 21 IU/L (ref 0–40)
Albumin/Globulin Ratio: 1.4 (ref 1.2–2.2)
Albumin: 4.1 g/dL (ref 3.8–4.8)
BUN/Creatinine Ratio: 14 (ref 12–28)
BUN: 13 mg/dL (ref 8–27)
Bilirubin Total: <0.2 mg/dL (ref 0.0–1.2)
CO2: 24 mmol/L (ref 20–29)
Chloride: 102 mmol/L (ref 96–106)
Creatinine, Ser: 0.91 mg/dL (ref 0.57–1.00)
Glucose: 106 mg/dL — ABNORMAL HIGH (ref 70–99)
eGFR: 67 mL/min/{1.73_m2} (ref >59)

## 2022-09-21 LAB — TSH RFX ON ABNORMAL TO FREE T4: TSH: 2.39 u[IU]/mL (ref 0.450–4.500)

## 2022-09-21 LAB — HEMOGLOBIN A1C: Est. average glucose Bld gHb Est-mCnc: 126 mg/dL

## 2022-09-21 LAB — B12 AND FOLATE PANEL: Vitamin B-12: 329 pg/mL (ref 232–1245)

## 2022-09-22 DIAGNOSIS — I491 Atrial premature depolarization: Secondary | ICD-10-CM | POA: Diagnosis not present

## 2022-09-22 DIAGNOSIS — R404 Transient alteration of awareness: Secondary | ICD-10-CM

## 2022-09-22 DIAGNOSIS — R002 Palpitations: Secondary | ICD-10-CM | POA: Diagnosis not present

## 2022-09-22 DIAGNOSIS — R42 Dizziness and giddiness: Secondary | ICD-10-CM

## 2022-09-23 ENCOUNTER — Telehealth: Payer: Self-pay | Admitting: Neurology

## 2022-09-23 NOTE — Telephone Encounter (Signed)
Pt daughter states they received a Body Guardian mini plus in the mail, she is asking if this was shipped to pt by provider here or if that had to have come from another provider, please call.

## 2022-09-23 NOTE — Telephone Encounter (Signed)
Called pt. Informed her of message that nurse [facility] sent. Pt said thank you so much for calling.

## 2022-09-23 NOTE — Telephone Encounter (Signed)
Please call them back and let them know this is a temporary heart monitor that is being provided and managed by the cardiologist. Their number is [phone]  and they also sent the patient a mychart message about this.

## 2022-09-30 ENCOUNTER — Ambulatory Visit
Admission: RE | Admit: 2022-09-30 | Discharge: 2022-09-30 | Disposition: A | Discharge status: Home / Self Care | Payer: PPO | Source: Ambulatory Visit | Attending: Neurology | Admitting: Neurology

## 2022-09-30 DIAGNOSIS — R404 Transient alteration of awareness: Secondary | ICD-10-CM

## 2022-09-30 DIAGNOSIS — R569 Unspecified convulsions: Secondary | ICD-10-CM | POA: Diagnosis not present

## 2022-09-30 MED ORDER — GADOPICLENOL 0.5 MMOL/ML IV SOLN
9.0000 mL | Freq: Once | INTRAVENOUS | Status: AC | PRN
  Administered 2022-09-30: 9 mL via INTRAVENOUS

## 2022-10-06 ENCOUNTER — Ambulatory Visit
Admission: RE | Admit: 2022-10-06 | Discharge: 2022-10-06 | Disposition: A | Discharge status: Home / Self Care | Payer: PPO | Source: Ambulatory Visit | Attending: Emergency Medicine | Admitting: Emergency Medicine

## 2022-10-06 DIAGNOSIS — N6001 Solitary cyst of right breast: Secondary | ICD-10-CM | POA: Diagnosis not present

## 2022-10-06 DIAGNOSIS — N6310 Unspecified lump in the right breast, unspecified quadrant: Secondary | ICD-10-CM

## 2022-10-06 DIAGNOSIS — R92323 Mammographic fibroglandular density, bilateral breasts: Secondary | ICD-10-CM | POA: Diagnosis not present

## 2022-10-07 ENCOUNTER — Ambulatory Visit (INDEPENDENT_AMBULATORY_CARE_PROVIDER_SITE_OTHER): Payer: PPO | Admitting: Neurology

## 2022-10-07 DIAGNOSIS — R4182 Altered mental status, unspecified: Secondary | ICD-10-CM | POA: Diagnosis not present

## 2022-10-07 DIAGNOSIS — R404 Transient alteration of awareness: Secondary | ICD-10-CM

## 2022-10-07 NOTE — Procedures (Signed)
    History:  72 year old transient alteration of awareness   EEG classification: Awake and drowsy  Description of the recording: The background rhythms of this recording consists of a fairly well modulated medium amplitude alpha rhythm of 9 Hz that is reactive to eye opening and closure. Present in the anterior head region is a 15-20 Hz beta activity. Photic stimulation was performed, did not show any abnormalities. Hyperventilation was also performed, did not show any abnormalities. Drowsiness was manifested by background fragmentation. There were presence of frequent left temporal sharp and slow waves seen during this recording. There was no focal slowing. There were no electrographic seizure identified.   Abnormality: Frequent left temporal sharp and slow wave discharges   Impression: This is an abnormal EEG recorded while drowsy and awake due to presence of left temporal sharp and slow waves discharges. This is consistent with an area of increase epileptogenic potential in the left temporal region.        Windell Norfolk, MD [facility]

## 2022-10-08 ENCOUNTER — Telehealth: Payer: Self-pay

## 2022-10-08 ENCOUNTER — Telehealth: Payer: Self-pay | Admitting: Gastroenterology

## 2022-10-08 ENCOUNTER — Other Ambulatory Visit: Payer: Self-pay | Admitting: Gastroenterology

## 2022-10-08 NOTE — Telephone Encounter (Signed)
Call to patient, reviewed results and Dr. Lucia Gaskins recommendations. Patient would like to discuss with husband and call back.

## 2022-10-08 NOTE — Telephone Encounter (Signed)
Inbound call from patient requesting refill for Nexium. Patient was scheduled to see Doug Sou on 8/16. Please advise

## 2022-10-08 NOTE — Telephone Encounter (Signed)
Fax confirmation received [phone].

## 2022-10-08 NOTE — Telephone Encounter (Signed)
Visit notes and demographics faxed to [facility]

## 2022-10-08 NOTE — Telephone Encounter (Signed)
[facility] reached out via fax. They need insurance information. This has been faxed to [employer] @ [phone]. Received a receipt of confirmation.

## 2022-10-08 NOTE — Telephone Encounter (Signed)
-----   Message from Anson Fret, MD sent at 10/07/2022  6:27 PM EDT ----- There is an area of abnormal brain waves. No seizures seen but could be an area of potential seizures. I'd like to follow up with a 3-day eeg. If she is willing please let me know, discuss with family thanks

## 2022-10-08 NOTE — Telephone Encounter (Signed)
Call from husband, explained ambulatory EEG and results. He and wife are in agreement. Will update Dr. Lucia Gaskins and send referral. Husband appreciative of call.

## 2022-10-09 MED ORDER — ESOMEPRAZOLE MAGNESIUM 40 MG PO CPDR: DELAYED_RELEASE_CAPSULE | ORAL | 0 refills | Status: DC

## 2022-10-09 NOTE — Telephone Encounter (Signed)
Nexium refill sent to [facility].

## 2022-10-12 MED ORDER — ESOMEPRAZOLE MAGNESIUM 40 MG PO CPDR: DELAYED_RELEASE_CAPSULE | ORAL | 0 refills | Status: DC

## 2022-10-12 NOTE — Addendum Note (Signed)
Addended by: Rise Paganini on: 10/12/2022 02:29 PM   Modules accepted: Orders

## 2022-10-12 NOTE — Telephone Encounter (Signed)
Sent!

## 2022-10-12 NOTE — Telephone Encounter (Signed)
Inbound call from patient stating she needs refill sent to [facility] on [facility]. Please advise.

## 2022-10-12 NOTE — Telephone Encounter (Signed)
Nexium refill sen to [facility] on [facility] [address] [facility] [address].

## 2022-10-23 ENCOUNTER — Ambulatory Visit: Payer: PPO | Attending: Neurology

## 2022-10-23 DIAGNOSIS — R404 Transient alteration of awareness: Secondary | ICD-10-CM

## 2022-10-23 DIAGNOSIS — R42 Dizziness and giddiness: Secondary | ICD-10-CM

## 2022-10-23 DIAGNOSIS — I491 Atrial premature depolarization: Secondary | ICD-10-CM

## 2022-10-23 DIAGNOSIS — R002 Palpitations: Secondary | ICD-10-CM

## 2022-10-24 DIAGNOSIS — R9401 Abnormal electroencephalogram [EEG]: Secondary | ICD-10-CM | POA: Diagnosis not present

## 2022-10-24 DIAGNOSIS — R569 Unspecified convulsions: Secondary | ICD-10-CM | POA: Diagnosis not present

## 2022-10-24 DIAGNOSIS — R404 Transient alteration of awareness: Secondary | ICD-10-CM | POA: Diagnosis not present

## 2022-10-25 DIAGNOSIS — R9401 Abnormal electroencephalogram [EEG]: Secondary | ICD-10-CM | POA: Diagnosis not present

## 2022-10-25 DIAGNOSIS — R404 Transient alteration of awareness: Secondary | ICD-10-CM | POA: Diagnosis not present

## 2022-10-25 DIAGNOSIS — R569 Unspecified convulsions: Secondary | ICD-10-CM | POA: Diagnosis not present

## 2022-10-26 DIAGNOSIS — R9401 Abnormal electroencephalogram [EEG]: Secondary | ICD-10-CM | POA: Diagnosis not present

## 2022-10-26 DIAGNOSIS — R404 Transient alteration of awareness: Secondary | ICD-10-CM | POA: Diagnosis not present

## 2022-10-26 DIAGNOSIS — R569 Unspecified convulsions: Secondary | ICD-10-CM | POA: Diagnosis not present

## 2022-10-27 DIAGNOSIS — R569 Unspecified convulsions: Secondary | ICD-10-CM | POA: Diagnosis not present

## 2022-10-27 DIAGNOSIS — R404 Transient alteration of awareness: Secondary | ICD-10-CM | POA: Diagnosis not present

## 2022-10-27 DIAGNOSIS — R9401 Abnormal electroencephalogram [EEG]: Secondary | ICD-10-CM | POA: Diagnosis not present

## 2022-11-06 ENCOUNTER — Other Ambulatory Visit: Payer: Self-pay | Admitting: Neurology

## 2022-11-06 DIAGNOSIS — R404 Transient alteration of awareness: Secondary | ICD-10-CM

## 2022-11-06 NOTE — Procedures (Signed)
Clinical History : This is a 72 y/o F who presents with major decline in memory. Having to write things down more often.  INTERMITTENT MONITORING with VIDEO TECHNICAL SUMMARY: This AVEEG was performed using equipment provided by [employer] utilizing Bluetooth ( Track[employer] ) amplifiers with continuous EEGT attended video collection using encrypted remote transmission via [employer] secured cellular tower network with data rates for each AVEEG performed. This is a Therapist, music AVEEG, obtained, according to the 10-20 international electrode placement system, reformatted digitally into referential and bipolar montages. Data was acquired with a minimum of 21 bipolar connections and sampled at a minimum rate of 250 cycles per second per channel, maximum rate of 450 cycles per second per channel and two channels for EKG. The entire VEEG study was recorded through cable and or radio telemetry for subsequent analysis. Specified epochs of the AVEEG data were identified at the direction of the subject by the depression of a push button by the patient. Each patients event file included data acquired two minutes prior to the push button activation and continuing until two minutes afterwards. AVEEG files were reviewed on [facility] server, Licensed Software provided by [employer] with a digital high frequency filter set at 70 Hz and a low frequency filter set at 1 Hz with a paper speed of 62mm/s resulting in 10 seconds per digital page. This entire AVEEG was reviewed by the EEG Technologist. Random time samples, random sleep samples, clips, patient initiated push button files with included patient daily diary logs, EEG Technologist pruned data was reviewed and verified for accuracy and validity by the governing reading neurologist in full details. This AEEGV was fully compliant with all requirements for CPT 97500 for setup, patient education, take down and administered by an EEG  technologist.  Long-Term EEG with Video was monitored intermittently by a qualified EEG technologist for the entirety of the recording; quality check-ins were performed at a minimum of every two hours, checking and documenting real-time data and video to assure the integrity and quality of the recording (e.g., camera position, electrode integrity and impedance), and identify the need for maintenance. For intermittent monitoring, an EEG Technologist monitored no more than 12 patients concurrently. Diagnostic video was captured at least 80% of the time during the recording.  PATIENT EVENTS: There were no patient events noted or captured during this recording.  TECHNOLOGIST EVENTS: There were presence of left frontotemporal sharp and slow waves discharges seen during this recording  TIME SAMPLES: 10-minutes of every 2 hours recorded are reviewed as random time samples.  SLEEP SAMPLES: 5-minutes of every 24 hours recorded are reviewed as random sleep samples.  AWAKE: At maximal level of alertness, the posterior dominant background activity was continuous, reactive, low voltage rhythm of 8-9 Hz. This was symmetric, well-modulated, and attenuated with eye opening. Diffuse, symmetric, frontocentral beta range activity was present.  SLEEP: N1 Sleep (Stage 1) was observed and characterized by the disappearance of alpha rhythm and the appearance of vertex activity. N2 Sleep (Stage 2) was observed and characterized by vertex waves, K-complexes, and sleep spindles. N3 (Stage 3) sleep was observed and characterized by high amplitude Delta activity of 20%. REM sleep was observed.  EKG: There were no arrhythmias or abnormalities noted during this recording.   Impression: This is an abnormal 72 hours ambulatory video EEG due to presence of left frontotemporal sharp and slow wave discharges. This is consistent with an increase epileptogenic potential in the left frontotemporal region.  Windell Norfolk,  MD  [facility]

## 2022-11-18 ENCOUNTER — Telehealth: Payer: Self-pay | Admitting: Neurology

## 2022-11-18 NOTE — Telephone Encounter (Signed)
Please result when available  

## 2022-11-18 NOTE — Telephone Encounter (Signed)
Pt stated she is following-up on EEG results.

## 2022-11-18 NOTE — Telephone Encounter (Signed)
Please let her know no seizures were seen during this EEG so I am not going to treat her for seizures. But there is an area on the left that is slightly abnormal but that does not mean it will become a seizure. However, Watch for seizure-like activity such as altered mental status (not talking back, not responding even when looking like she is doing something , doing abnormal things like repeatively smacking her lips while not responding to someone talking to her, or for episodes of involuntary repetitive muscle movement or actual clonic-tonic seizures). But there were NO seizures, please mention that several times. Thanks  mpression: This is an abnormal 72 hours ambulatory video EEG due to presence of left frontotemporal sharp and slow wave discharges. This is consistent with an increase epileptogenic potential in the left frontotemporal region.

## 2022-11-19 NOTE — Telephone Encounter (Addendum)
Contacted pt, no answer. LVM per DPR, informing her Dr Lucia Gaskins reviewed her EEG. No seizures were seen during this EEG so she will not treat her for seizures. But there is an area on the left that is slightly abnormal but that does not mean it will become a seizure. However, Watch for seizure-like activity such as altered mental status (not talking back, not responding even when looking like she is doing something , doing abnormal things like repeatively smacking her lips while not responding to someone talking to her, or for episodes of involuntary repetitive muscle movement or actual clonic-tonic seizures). Number provided to call back with questions. Will also send MC msg.

## 2022-11-23 ENCOUNTER — Telehealth: Payer: Self-pay | Admitting: Neurology

## 2022-11-23 NOTE — Telephone Encounter (Signed)
If that is the case I would treat her for seizures, since she has abnormal brain waves and what she is describing sounds like a seizure. Let's set up a video call this week and we can discuss and maybe she can send me the video somehow or I can see it over video. How about 2pm on Wednesday looks like I have a slot open.

## 2022-11-23 NOTE — Telephone Encounter (Signed)
Spoke with daughter (checked DPR) Pt is requesting Dr Lucia Gaskins to call her at home concerning pt seizure like activity Earlier this month Per daughter pt stares into space and mouth is twitching  last ing for 4-5 minutes  pt is confused after seizure like activity and is back to normal after 15 minutes Daughter didn't take pt to ED or call PCP to be evaluated Daughter states she has video of patient having seizure like activity and wants Dr Lucia Gaskins to look at it Made daughter aware to call PCP and if pt has another episode please take her to ED to get evaluated  Daughter states that pt has appointment in September but wants pt to be seen sooner . Informed daughter will send message to Dr Lucia Gaskins but she doesn't have anything availability  for a sooner appointment but will make Dr Lucia Gaskins aware of request Daughter expressed understanding and thanked me for calling

## 2022-11-23 NOTE — Telephone Encounter (Signed)
Pt daughter came into the office (on dpr) states she would like to speak with Dr. Lucia Gaskins about her mothers issues. July the 4th her mother had a seizure and July 18th. They have this on video and daughter would like to show dr,. Lucia Gaskins. States something is going on that they are concerned about. Pt daughter would also like a letter stating that the pt can not drive.  [phone]

## 2022-11-24 ENCOUNTER — Encounter: Payer: Self-pay | Admitting: Neurology

## 2022-11-24 ENCOUNTER — Encounter: Payer: Self-pay | Admitting: Emergency Medicine

## 2022-11-24 NOTE — Telephone Encounter (Signed)
Spoke with daughter declined mychart visit tomorrow with Dr Lucia Gaskins to discuss pt seizure like activities. Daughter states will send video through MyChart that shows pt having seizure . Informed daughter again if pt has another seizure please go to ED so pt can be evaluated . I did encourage daughter to call pts PCP to make them aware of pts seizure Daughter said ok and thanked me for calling.

## 2022-11-24 NOTE — Telephone Encounter (Addendum)
Spoke to daughter Patsy Lager) couldn't DL video of mom having seizures to [employer] . Gave her [employer] email address to send Video too and than debra can send to dr Lucia Gaskins . Daughter was very Adult nurse and thanked me for calling

## 2022-11-30 DIAGNOSIS — Z0289 Encounter for other administrative examinations: Secondary | ICD-10-CM

## 2022-11-30 NOTE — Telephone Encounter (Signed)
OK, I haven't received anything yet thanks

## 2022-12-01 DIAGNOSIS — N958 Other specified menopausal and perimenopausal disorders: Secondary | ICD-10-CM | POA: Diagnosis not present

## 2022-12-01 DIAGNOSIS — M8588 Other specified disorders of bone density and structure, other site: Secondary | ICD-10-CM | POA: Diagnosis not present

## 2022-12-07 DIAGNOSIS — R0989 Other specified symptoms and signs involving the circulatory and respiratory systems: Secondary | ICD-10-CM | POA: Diagnosis not present

## 2022-12-08 ENCOUNTER — Telehealth: Payer: Self-pay

## 2022-12-08 NOTE — Telephone Encounter (Signed)
Received FMLA paperwork for patient's daughter Lisa Miles.  Daughter is requesting time off of work to take care of her mother.  She is requesting 5 months from December 16, 2022 to May 18, 2023.  Form will be in POD 4 for MD to review to see if this is appropriate

## 2022-12-10 ENCOUNTER — Encounter (INDEPENDENT_AMBULATORY_CARE_PROVIDER_SITE_OTHER): Payer: Self-pay

## 2022-12-10 NOTE — Telephone Encounter (Signed)
The videos do not look like seizures to me. We have a follow up in September we can discuss. Maybe we can try a medication and see if she improves but would like to discuss at appointment. I can't give 5 months of FMLA for the daughter to take care of mother, They have to ocm eup with a solution for help with mother and we can discuss at next appointment.

## 2022-12-10 NOTE — Telephone Encounter (Signed)
I cannot do this. We can discuss at next appointment but they have to come up with a different solution we gave them a packet with lots of resouces in it ty can look in there for ideas I see them in September we can discuss then. Also point out [facility] for them to contact.

## 2022-12-11 ENCOUNTER — Encounter: Payer: Self-pay | Admitting: Gastroenterology

## 2022-12-11 ENCOUNTER — Ambulatory Visit (INDEPENDENT_AMBULATORY_CARE_PROVIDER_SITE_OTHER): Payer: PPO | Admitting: Gastroenterology

## 2022-12-11 VITALS — BP 122/66 | HR 60 | Ht 65.0 in | Wt 165.4 lb

## 2022-12-11 DIAGNOSIS — R0989 Other specified symptoms and signs involving the circulatory and respiratory systems: Secondary | ICD-10-CM | POA: Diagnosis not present

## 2022-12-11 MED ORDER — ESOMEPRAZOLE MAGNESIUM 20 MG PO CPDR: 20.0000 mg | DELAYED_RELEASE_CAPSULE | Freq: Two times a day (BID) | ORAL | 3 refills | Status: DC

## 2022-12-11 NOTE — Progress Notes (Signed)
12/11/2022 Lisa Miles [id number] 11/18/1950   HISTORY OF PRESENT ILLNESS:  This is a 72 year old female who was seen by me the only time here back in 07/2020 after having seen ENT, etc, for complaints of throat clearing.  She was on Nexium 20 mg twice daily and I had increased it to 40 mg twice daily to see if that would help with her symptoms.  Prior to 2022 she had follow-up with Dr. Loreta Ave and it looks like last year in 2023 she returned to Dr. Loreta Ave.  Her daughter expresses that her mother's had some issues with memory, etc. recently.  This appointment was made since she had called here requesting a refill on her Nexium since I was the last one who had prescribed it.  Daughter would like her to decrease the dose since it really has not been helping her throat clearing and she is still following with ENT for that.  She is still having the throat clearing, but denies any heartburn/reflux.  No dysphagia, etc.  She says that everything is fine except that this throat clearing issue and is now following with Dr. Aleene Davidson from ENT.  Past Medical History:  Diagnosis Date   Aortic stenosis, mild-moderate 07/2012   TTE October 21: Moderate aortic valve thickening with mild to moderate AI & mild AS; TEE: mild to moderate AI & MIld AS   Arthritis    Phreesia 11/06/2019   Asthma    Bilateral bunions    Bunionectomies performed   Bronchitis, chronic (HCC)    Cataract    Phreesia 11/06/2019   Heart disease    Hyperlipidemia    Hypertension    Good control   Inguinodynia    Bilateral groin pain   Lower extremity edema    Chronic. Venous Duplex 11/06/11 SUMMARY: 1) Bilateral Lower Extremities: No evidence of DVT or thrombophlebitis.  2) Right Common Femoral Vein: Demonstrated mild valvular insufficiency with a greater than (1) sec of duration. Mildly abnormal LE Venous duplex Doppler.   Obesity    Palpitations    Relatively well controlled   Scoliosis    DG Chest 2 View x-ray on 07/30/11 by  Dr. Cleta Alberts shows a scoliosis.   Severe mitral regurgitation by prior echocardiogram 07/2012   Mild AMVL prolapse, Mod MR -- no MVP noted in 01/2018 -> 11/'21: Severe MR due to restricted movement of P3 scallop of PMVL (IIIB) due to incomplete leaflet coaptation -> thickened leaflets consistent with Rheumatic Heart Valve Disease.   Past Surgical History:  Procedure Laterality Date   ABDOMINAL HYSTERECTOMY     ANKLE ARTHROSCOPY Left    BUNIONECTOMY Bilateral    CARDIAC EVENT MONITOR  03/2020   (04/05/2020 -05/04/2020): Mostly NSR.  Heart rate range 42-148 bpm.  3 short bursts of 4-5 beats NSVT (asymptomatic).  116 beat run of PAT.  Rare PACs and PVCs.  Some bigeminy and trigeminy.   CARDIOPULMONARY EXERCISE TEST (CPX)  05/09/2020   (done with Ex St Echo): Normal Functional Capacity. No indication for CP limitations.  Body Habitus contributes to Exercise Intolerance.   EXERCISE STRESS ECHO  05/09/2020   TTE shows normal EF 65% with LVH No pericardial effusion Normal RV size and function Moderate appearing rheumatic MR/AR.;; -- NO COMMENT ON EXERCISE EFFECT ON MR!!! (even though this was the reason for ordering the test)   EYE SURGERY N/A    Phreesia 11/06/2019   KNEE ARTHROSCOPY Right    Left knee open surgery  pt thinks she only had shots in L knee, not surgery   RIGHT/LEFT HEART CATH AND CORONARY ANGIOGRAPHY N/A 03/28/2020   RIGHT/LEFT HEART CATH AND CORONARY ANGIOGRAPHY;  Marykay Lex, MD;  Location: [facility] INVASIVE CV LAB; angiographically normal coronary arteries.  Cloacal LM. Normal RHC Pressures (PAP 40/13 - mean 22 mmHg, PCWP , V wave 25 mmHg c/w MR, LVEDP 15 mmHg); CO/CI 5.02 / 2.45 (reduced).   TEE WITHOUT CARDIOVERSION N/A 03/13/2020   Procedure: TRANSESOPHAGEAL ECHOCARDIOGRAM (TEE);  Surgeon: Sande Rives, MD;  [facility] ENDOSCOPY;;; Severe MR 2/2 restricted P3 scallop of PMVL (IIIB) w/ incomplete leaflet coaptation.  Thickened /degenerative leaflets c/w Rheumatic Valvular  Heart Disease.  2D PISA radius 0.9 cm with 2D ERO 0.26 cm2 with R Vol 62 cc. PV blunting w/ BP 109/60 mmHg. No MS. Mild-mod AI / Mild AS. Gr 2 plaque Aorta   TOTAL ABDOMINAL HYSTERECTOMY     TRANSTHORACIC ECHOCARDIOGRAM  05/09/2021   EF 60 to 65%.  No RWMA.  GR 1 DD with elevated LAP-moderately dilated left atrium (LA).  Normal RV size and function.  Moderate to severe MR with no MS.  Moderate aortic calcification/sclerosis with only mild stenosis.  Ascending aorta 39 mm.   TRANSTHORACIC ECHOCARDIOGRAM  01/2018   a) Normal LV size.  Mod Conc LVH.  EF 60-65%.  No R WMA.  GR 1 DD (high P).  Mild AS (MG 16 mmHg) w/ Mod AI.  Severe LA dilation.  Mild MR.; 01/2020:  EF 65-70%. No RWMA.  Severe MR w/ restricted PMVL movement.  (Recommend TEE).  Mod AoV thickening w/ mild to mod AS & Mod AI.  Nl RV fxn.  Mildly elevated filling P. Severe LA dilation.   Zio Patch Monitor  05/2021   Predominant Rhythm Is Sinus Rhythm - HR range 45 -139 bpm & avg 74 bpm.  Occ PACs (2.9%) w/ some couplets and triplets,& rare PVCs. 20 Atrial Runs: Fastest - 6 beats w/ rate 235 bpm; Longest 12 beats @ 138 bpm (~ 5.2 sec); Symptoms were noted with PACs and PVCs not with atrial tachycardia runs.    reports that she has never smoked. She has never used smokeless tobacco. She reports that she does not drink alcohol and does not use drugs. family history includes Arthritis in her mother; Cancer in her father; Dementia in her mother; Diabetes in her brother, maternal grandfather, and mother; Gout in her mother; Hyperlipidemia in her mother; Hypertension in her brother, brother, and mother. No Known Allergies    Outpatient Encounter Medications as of 12/11/2022  Medication Sig   aspirin EC 81 MG tablet Take 81 mg by mouth daily. Swallow whole.   calcium carbonate (CALCIUM 600) 600 MG TABS tablet    chlorthalidone (HYGROTON) 25 MG tablet Take 1/2 tablet of 25 mg Chlorthalidone  by mouth daily   Cholecalciferol (VITAMIN D) 50 MCG (2000  UT) CAPS Take 2,000 Units by mouth daily.   esomeprazole (NEXIUM) 40 MG capsule TAKE 1 CAPSULE BY MOUTH TWICE DAILY BEFORE A MEAL   hydroxypropyl methylcellulose / hypromellose (ISOPTO TEARS / GONIOVISC) 2.5 % ophthalmic solution Place 1 drop into both eyes in the morning and at bedtime.   ibuprofen (ADVIL) 600 MG tablet Take 600 mg by mouth every 8 (eight) hours as needed.   metoprolol succinate (TOPROL XL) 25 MG 24 hr tablet Take 1 tablet (25 mg total) by mouth daily.   rosuvastatin (CRESTOR) 10 MG tablet TAKE 1 TABLET(10 MG) BY MOUTH DAILY  traMADol (ULTRAM) 50 MG tablet Take 50 mg by mouth every 6 (six) hours as needed.   valsartan (DIOVAN) 320 MG tablet TAKE 1 TABLET(320 MG) BY MOUTH DAILY   amoxicillin (AMOXIL) 500 MG tablet Take 500 mg by mouth. Take 4 tablets before dental procedures. (Patient not taking: Reported on 12/11/2022)   [DISCONTINUED] estradiol (ESTRACE) 2 MG tablet Take 2 mg by mouth daily. (Patient not taking: Reported on 12/11/2022)   No facility-administered encounter medications on file as of 12/11/2022.     REVIEW OF SYSTEMS  : All other systems reviewed and negative except where noted in the History of Present Illness.   PHYSICAL EXAM: Ht 5\' 5"  (1.651 m)   Wt 165 lb 6.4 oz (75 kg)   BMI 27.52 kg/m  General: Well developed female in no acute distress Head: Normocephalic and atraumatic Eyes:  Sclerae anicteric, conjunctiva pink. Ears: Normal auditory acuity Lungs: Clear throughout to auscultation; no W/R/R. Heart: Regular rate and rhythm; no M/R/G. Musculoskeletal: Symmetrical with no gross deformities  Extremities: No edema  Neurological: Alert oriented x 4, grossly non-focal Psychological:  Alert and cooperative. Normal mood and affect  ASSESSMENT AND PLAN: *Chronic throat clearing: Patient was seen here for this issue back in 2022 after having seen ENT, etc.  She was on Nexium 20 mg twice daily and I had increased it to 40 mg twice daily to see if that  would help with her symptoms.  Prior to 2022 she had follow-up with Dr. Loreta Ave and it looks like last year in 2023 she returned to Dr. Loreta Ave.  Her daughter expresses that her mother's had some issues with memory, etc. recently.  I expressed to them that she needs to choose one practice to stay with.  This appointment was made since she had called here requesting a refill on her Nexium since I was the last one who had prescribed it.  Daughter would like her to decrease the dose since it really has not been helping her throat clearing and she is still following with ENT for that.  Can decrease to Nexium 20 mg twice daily again for now.  New prescription sent to her pharmacy.  She will decide if she is going to follow here or with Dr. Loreta Ave, sounds like they may stay with Dr. Loreta Ave as she had done her procedures, etc. in the past.   CC:  [place], [place], *

## 2022-12-11 NOTE — Progress Notes (Signed)
Noted  

## 2022-12-11 NOTE — Telephone Encounter (Signed)
Contact Lisa Miles back, informed her of MD recommendations that she is not able to give her this time. We can discuss at next appointment. Advised to  look into the packet with lots of resouces  we gave them or [facility]. She verbally understood and was appreciative. Will come to office to pick up paperwork and refund fee.

## 2022-12-11 NOTE — Patient Instructions (Addendum)
We have sent the following medications to your pharmacy for you to pick up at your convenience: Nexium 20 mg twice daily.   Dr. Loreta Ave was your previous GI doctor, please let us know if you decide to continue to follow with her.   _______________________________________________________  If your blood pressure at your visit was 140/90 or greater, please contact your primary care physician to follow up on this.  _______________________________________________________  If you are age 2 or older, your body mass index should be between 23-30. Your Body mass index is 27.52 kg/m. If this is out of the aforementioned range listed, please consider follow up with your Primary Care Provider.  If you are age 72 or younger, your body mass index should be between 19-25. Your Body mass index is 27.52 kg/m. If this is out of the aformentioned range listed, please consider follow up with your Primary Care Provider.   ________________________________________________________  The [facility] providers would like to encourage you to use [facility] to communicate with providers for non-urgent requests or questions.  Due to long hold times on the telephone, sending your provider a message by [facility] may be a faster and more efficient way to get a response.  Please allow 48 business hours for a response.  Please remember that this is for non-urgent requests.     Gastroesophageal Reflux Disease, Adult  Gastroesophageal reflux (GER) happens when acid from the stomach flows up into the tube that connects the mouth and the stomach (esophagus). Normally, food travels down the esophagus and stays in the stomach to be digested. However, when a [place] has GER, food and stomach acid sometimes move back up into the esophagus. If this becomes a more serious problem, the [place] may be diagnosed with a disease called gastroesophageal reflux disease (GERD). GERD occurs when the reflux: Happens often. Causes frequent or severe  symptoms. Causes problems such as damage to the esophagus. When stomach acid comes in contact with the esophagus, the acid may cause inflammation in the esophagus. Over time, GERD may create small holes (ulcers) in the lining of the esophagus. What are the causes? This condition is caused by a problem with the muscle between the esophagus and the stomach (lower esophageal sphincter, or LES). Normally, the LES muscle closes after food passes through the esophagus to the stomach. When the LES is weakened or abnormal, it does not close properly, and that allows food and stomach acid to go back up into the esophagus. The LES can be weakened by certain dietary substances, medicines, and medical conditions, including: Tobacco use. Pregnancy. Having a hiatal hernia. Alcohol use. Certain foods and beverages, such as coffee, chocolate, onions, and peppermint. What increases the risk? You are more likely to develop this condition if you: Have an increased body weight. Have a connective tissue disorder. Take NSAIDs, such as ibuprofen. What are the signs or symptoms? Symptoms of this condition include: Heartburn. Difficult or painful swallowing and the feeling of having a lump in the throat. A bitter taste in the mouth. Bad breath and having a large amount of saliva. Having an upset or bloated stomach and belching. Chest pain. Different conditions can cause chest pain. Make sure you see your health care provider if you experience chest pain. Shortness of breath or wheezing. Ongoing (chronic) cough or a nighttime cough. Wearing away of tooth enamel. Weight loss. How is this diagnosed? This condition may be diagnosed based on a medical history and a physical exam. To determine if you have mild  or severe GERD, your health care provider may also monitor how you respond to treatment. You may also have tests, including: A test to examine your stomach and esophagus with a small camera (endoscopy). A test  that measures the acidity level in your esophagus. A test that measures how much pressure is on your esophagus. A barium swallow or modified barium swallow test to show the shape, size, and functioning of your esophagus. How is this treated? Treatment for this condition may vary depending on how severe your symptoms are. Your health care provider may recommend: Changes to your diet. Medicine. Surgery. The goal of treatment is to help relieve your symptoms and to prevent complications. Follow these instructions at home: Eating and drinking  Follow a diet as recommended by your health care provider. This may involve avoiding foods and drinks such as: Coffee and tea, with or without caffeine. Drinks that contain alcohol. Energy drinks and sports drinks. Carbonated drinks or sodas. Chocolate and cocoa. Peppermint and mint flavorings. Garlic and onions. Horseradish. Spicy and acidic foods, including peppers, chili powder, curry powder, vinegar, hot sauces, and barbecue sauce. Citrus fruit juices and citrus fruits, such as oranges, lemons, and limes. Tomato-based foods, such as red sauce, chili, salsa, and pizza with red sauce. Fried and fatty foods, such as donuts, french fries, potato chips, and high-fat dressings. High-fat meats, such as hot dogs and fatty cuts of red and white meats, such as rib eye steak, sausage, ham, and bacon. High-fat dairy items, such as whole milk, butter, and cream cheese. Eat small, frequent meals instead of large meals. Avoid drinking large amounts of liquid with your meals. Avoid eating meals during the 2-3 hours before bedtime. Avoid lying down right after you eat. Do not exercise right after you eat. Lifestyle  Do not use any products that contain nicotine or tobacco. These products include cigarettes, chewing tobacco, and vaping devices, such as e-cigarettes. If you need help quitting, ask your health care provider. Try to reduce your stress by using  methods such as yoga or meditation. If you need help reducing stress, ask your health care provider. If you are overweight, reduce your weight to an amount that is healthy for you. Ask your health care provider for guidance about a safe weight loss goal. General instructions Pay attention to any changes in your symptoms. Take over-the-counter and prescription medicines only as told by your health care provider. Do not take aspirin, ibuprofen, or other NSAIDs unless your health care provider told you to take these medicines. Wear loose-fitting clothing. Do not wear anything tight around your waist that causes pressure on your abdomen. Raise (elevate) the head of your bed about 6 inches (15 cm). You can use a wedge to do this. Avoid bending over if this makes your symptoms worse. Keep all follow-up visits. This is important. Contact a health care provider if: You have: New symptoms. Unexplained weight loss. Difficulty swallowing or it hurts to swallow. Wheezing or a persistent cough. A hoarse voice. Your symptoms do not improve with treatment. Get help right away if: You have sudden pain in your arms, neck, jaw, teeth, or back. You suddenly feel sweaty, dizzy, or light-headed. You have chest pain or shortness of breath. You vomit and the vomit is green, yellow, or black, or it looks like blood or coffee grounds. You faint. You have stool that is red, bloody, or black. You cannot swallow, drink, or eat. These symptoms may represent a serious problem that is an emergency.  Do not wait to see if the symptoms will go away. Get medical help right away. Call your local emergency services (911 in the [place] Do not drive yourself to the hospital. Summary Gastroesophageal reflux happens when acid from the stomach flows up into the esophagus. GERD is a disease in which the reflux happens often, causes frequent or severe symptoms, or causes problems such as damage to the esophagus. Treatment for this  condition may vary depending on how severe your symptoms are. Your health care provider may recommend diet and lifestyle changes, medicine, or surgery. Contact a health care provider if you have new or worsening symptoms. Take over-the-counter and prescription medicines only as told by your health care provider. Do not take aspirin, ibuprofen, or other NSAIDs unless your health care provider told you to do so. Keep all follow-up visits as told by your health care provider. This is important. This information is not intended to replace advice given to you by your health care provider. Make sure you discuss any questions you have with your health care provider. Document Revised: 10/21/2019 Document Reviewed: 10/23/2019 Elsevier Patient Education  2024 [employer].

## 2022-12-11 NOTE — Telephone Encounter (Addendum)
Form is at front desk.

## 2022-12-14 NOTE — Telephone Encounter (Signed)
Spoke to Daughter (checked DPR) gave Dr.Ahern's recommendation  Pt was concerned about some episodes pt was having . Per Dr Lucia Gaskins she reviewed the video you sent of pt and  does  not look like seizures, will discuss further at pt appointment in sept . Informed daughter if pt has another episode please take pt to ED or Urgent care to get evaluated. Daughter expressed understanding. Daughter states already discussed FMLA and understands Dr Trevor Mace decision Daughter  thanked me for calling

## 2022-12-19 ENCOUNTER — Other Ambulatory Visit: Payer: Self-pay | Admitting: Emergency Medicine

## 2022-12-19 DIAGNOSIS — I10 Essential (primary) hypertension: Secondary | ICD-10-CM

## 2022-12-26 ENCOUNTER — Other Ambulatory Visit: Payer: Self-pay | Admitting: Cardiology

## 2023-01-06 ENCOUNTER — Encounter: Payer: Self-pay | Admitting: Emergency Medicine

## 2023-01-06 ENCOUNTER — Ambulatory Visit (INDEPENDENT_AMBULATORY_CARE_PROVIDER_SITE_OTHER): Payer: PPO | Admitting: Emergency Medicine

## 2023-01-06 VITALS — BP 128/82 | HR 106 | Temp 98.5°F | Ht 65.0 in | Wt 195.1 lb

## 2023-01-06 DIAGNOSIS — I10 Essential (primary) hypertension: Secondary | ICD-10-CM | POA: Diagnosis not present

## 2023-01-06 DIAGNOSIS — G40909 Epilepsy, unspecified, not intractable, without status epilepticus: Secondary | ICD-10-CM | POA: Diagnosis not present

## 2023-01-06 DIAGNOSIS — Z23 Encounter for immunization: Secondary | ICD-10-CM | POA: Diagnosis not present

## 2023-01-06 DIAGNOSIS — I38 Endocarditis, valve unspecified: Secondary | ICD-10-CM

## 2023-01-06 DIAGNOSIS — E785 Hyperlipidemia, unspecified: Secondary | ICD-10-CM | POA: Diagnosis not present

## 2023-01-06 NOTE — Assessment & Plan Note (Addendum)
Chronic stable condition.  Continue rosuvastatin 10 mg daily. Diet and nutrition discussed. 

## 2023-01-06 NOTE — Assessment & Plan Note (Signed)
BP Readings from Last 3 Encounters:  01/06/23 128/82  12/11/22 122/66  07/16/22 122/72  Well-controlled hypertension Continue valsartan 320 mg daily, metoprolol succinate 25 mg daily, chlorthalidone 25 mg daily Cardiovascular risks associated with hypertension discussed

## 2023-01-06 NOTE — Patient Instructions (Signed)
Seizure, Adult A seizure is a sudden burst of abnormal electrical and chemical activity in the brain. Seizures usually last from 30 seconds to 2 minutes.  What are the causes? Common causes of this condition include: Fever or infection. Problems that affect the brain. These may include: A brain or head injury. Bleeding in the brain. A brain tumor. Low levels of blood sugar or salt. Kidney problems or liver problems. Conditions that are passed from parent to child (are inherited). Problems with a substance, such as: Having a reaction to a drug or a medicine. Stopping the use of a substance all of a sudden (withdrawal). A stroke. Disorders that affect how you develop. Sometimes, the cause may not be known.  What increases the risk? Having someone in your family who has epilepsy. In this condition, seizures happen again and again over time. They have no clear cause. Having had a tonic-clonic seizure before. This type of seizure causes you to: Tighten the muscles of the whole body. Lose consciousness. Having had a head injury or strokes before. Having had a lack of oxygen at birth. What are the signs or symptoms? There are many types of seizures. The symptoms vary depending on the type of seizure you have. Symptoms during a seizure Shaking that you cannot control (convulsions) with fast, jerky movements of muscles. Stiffness of the body. Breathing problems. Feeling mixed up (confused). Staring or not responding to sound or touch. Head nodding. Eyes that blink, flutter, or move fast. Drooling, grunting, or making clicking sounds with your mouth Losing control of when you pee or poop. Symptoms before a seizure Feeling afraid, nervous, or worried. Feeling like you may vomit. Feeling like: You are moving when you are not. Things around you are moving when they are not. Feeling like you saw or heard something before (dj vu). Odd tastes or smells. Changes in how you see. You may  see flashing lights or spots. Symptoms after a seizure Feeling confused. Feeling sleepy. Headache. Sore muscles. How is this treated? If your seizure stops on its own, you will not need treatment. If your seizure lasts longer than 5 minutes, you will normally need treatment. Treatment may include: Medicines given through an IV tube. Avoiding things, such as medicines, that are known to cause your seizures. Medicines to prevent seizures. A device to prevent or control seizures. Surgery. A diet low in carbohydrates and high in fat (ketogenic diet). Follow these instructions at home: Medicines Take over-the-counter and prescription medicines only as told by your doctor. Avoid foods or drinks that may keep your medicine from working, such as alcohol. Activity Follow instructions about driving, swimming, or doing things that would be dangerous if you had another seizure. Wait until your doctor says it is safe for you to do these things. If you live in the [place] ask your local department of motor vehicles when you can drive. Get a lot of rest. Teaching others  Teach friends and family what to do when you have a seizure. They should: Help you get down to the ground. Protect your head and body. Loosen any clothing around your neck. Turn you on your side. Know whether or not you need emergency care. Stay with you until you are better. Also, tell them what not to do if you have a seizure. Tell them: They should not hold you down. They should not put anything in your mouth. General instructions Avoid anything that gives you seizures. Keep a seizure diary. Write down: What you remember  about each seizure. What you think caused each seizure. Keep all follow-up visits. Contact a doctor if: You have another seizure or seizures. Call the doctor each time you have a seizure. The pattern of your seizures changes. You keep having seizures with treatment. You have symptoms of being sick or  having an infection. You are not able to take your medicine. Get help right away if: You have any of these problems: A seizure that lasts longer than 5 minutes. Many seizures in a row and you do not feel better between seizures. A seizure that makes it harder to breathe. A seizure and you can no longer speak or use part of your body. You do not wake up right after a seizure. You get hurt during a seizure. You feel confused or have pain right after a seizure. These symptoms may be an emergency. Get help right away. Call your local emergency services (911 in the [place] Do not wait to see if the symptoms will go away. Do not drive yourself to the hospital. Summary A seizure is a sudden burst of abnormal electrical and chemical activity in the brain. Seizures normally last from 30 seconds to 2 minutes. Causes of seizures include illness, injury to the head, low levels of blood sugar or salt, and certain conditions. Most seizures will stop on their own in less than 5 minutes. Seizures that last longer than 5 minutes are a medical emergency and need treatment right away. Many medicines are used to treat seizures. Take over-the-counter and prescription medicines only as told by your doctor. This information is not intended to replace advice given to you by your health care provider. Make sure you discuss any questions you have with your health care provider. Document Revised: 10/18/2019 Document Reviewed: 10/20/2019 Elsevier Patient Education  2024 [employer].

## 2023-01-06 NOTE — Progress Notes (Signed)
Lisa Miles 72 y.o.   Chief Complaint  Patient presents with   Medical Management of Chronic Issues    f/u appt, patient states she has seen the neurologist, no concerns     HISTORY OF PRESENT ILLNESS: This is a 72 y.o. female here for 35-month follow-up.  Accompanied by daughter. Chief concern is presence of intermittent seizures. Has been seen by neurologist.  Office visit report reviewed.  MRI of brain June 2024 report reviewed.  Had EEG done which showed abnormal brain waves and epileptogenic left temporal area.  Not on medications yet.  Has follow-up appointment with neurologist later this month. Daughter describes and shows video of patient having seizure like activity followed by postictal state.  HPI   Prior to Admission medications   Medication Sig Start Date End Date Taking? Authorizing Provider  aspirin EC 81 MG tablet Take 81 mg by mouth daily. Swallow whole.   Yes [provider]  calcium carbonate (CALCIUM 600) 600 MG TABS tablet    Yes [provider]  chlorthalidone (HYGROTON) 25 MG tablet Take 1/2 tablet of 25 mg Chlorthalidone  by mouth daily 12/23/21  Yes Marykay Lex, MD  Cholecalciferol (VITAMIN D) 50 MCG (2000 UT) CAPS Take 2,000 Units by mouth daily.   Yes [provider]  esomeprazole (NEXIUM) 20 MG capsule Take 1 capsule (20 mg total) by mouth 2 (two) times daily before a meal. 12/11/22  Yes Zehr, Shanda Bumps D, PA-C  ibuprofen (ADVIL) 600 MG tablet Take 600 mg by mouth every 8 (eight) hours as needed.   Yes [provider]  metoprolol succinate (TOPROL-XL) 25 MG 24 hr tablet TAKE 1 TABLET(25 MG) BY MOUTH DAILY 12/29/22  Yes Marykay Lex, MD  rosuvastatin (CRESTOR) 10 MG tablet TAKE 1 TABLET(10 MG) BY MOUTH DAILY 02/17/22  Yes Flemon Kelty, Eilleen Kempf, MD  traMADol (ULTRAM) 50 MG tablet Take 50 mg by mouth every 6 (six) hours as needed.   Yes [provider]  valsartan (DIOVAN) 320 MG tablet TAKE 1 TABLET(320  MG) BY MOUTH DAILY 12/20/22  Yes Lanna Labella, Eilleen Kempf, MD  amoxicillin (AMOXIL) 500 MG tablet Take 500 mg by mouth. Take 4 tablets before dental procedures. Patient not taking: Reported on 12/11/2022    [provider]  hydroxypropyl methylcellulose / hypromellose (ISOPTO TEARS / GONIOVISC) 2.5 % ophthalmic solution Place 1 drop into both eyes in the morning and at bedtime.    [provider]    No Known Allergies  Patient Active Problem List   Diagnosis Date Noted   Memory problem 07/07/2022   Chronic nonintractable headache 07/07/2022   Visual complaint 07/07/2022   PAC (premature atrial contraction) -wiht Atrial Runs 07/19/2021   Mild aortic stenosis 06/06/2021   Valvular heart disease 02/20/2021   OSA (obstructive sleep apnea) 11/02/2019   Non-restorative sleep 08/24/2019   Primary osteoarthritis involving multiple joints 04/01/2018   OAB (overactive bladder) 04/01/2018   Thyromegaly 03/28/2013   Essential hypertension 07/14/2012   Dyslipidemia 05/30/2009   Moderate to severe mitral regurgitation 05/30/2009   ASTHMA UNSPECIFIED WITH EXACERBATION 04/02/2009    Past Medical History:  Diagnosis Date   Aortic stenosis, mild-moderate 07/2012   TTE October 21: Moderate aortic valve thickening with mild to moderate AI & mild AS; TEE: mild to moderate AI & MIld AS   Arthritis    Phreesia 11/06/2019   Asthma    Bilateral bunions    Bunionectomies performed   Bronchitis, chronic (HCC)  Cataract    Phreesia 11/06/2019   Heart disease    Hyperlipidemia    Hypertension    Good control   Inguinodynia    Bilateral groin pain   Lower extremity edema    Chronic. Venous Duplex 11/06/11 SUMMARY: 1) Bilateral Lower Extremities: No evidence of DVT or thrombophlebitis.  2) Right Common Femoral Vein: Demonstrated mild valvular insufficiency with a greater than (1) sec of duration. Mildly abnormal LE Venous duplex Doppler.   Obesity    Palpitations    Relatively well  controlled   Scoliosis    DG Chest 2 View x-ray on 07/30/11 by Dr. Cleta Alberts shows a scoliosis.   Severe mitral regurgitation by prior echocardiogram 07/2012   Mild AMVL prolapse, Mod MR -- no MVP noted in 01/2018 -> 11/'21: Severe MR due to restricted movement of P3 scallop of PMVL (IIIB) due to incomplete leaflet coaptation -> thickened leaflets consistent with Rheumatic Heart Valve Disease.    Past Surgical History:  Procedure Laterality Date   ABDOMINAL HYSTERECTOMY     ANKLE ARTHROSCOPY Left    BUNIONECTOMY Bilateral    CARDIAC EVENT MONITOR  03/2020   (04/05/2020 -05/04/2020): Mostly NSR.  Heart rate range 42-148 bpm.  3 short bursts of 4-5 beats NSVT (asymptomatic).  116 beat run of PAT.  Rare PACs and PVCs.  Some bigeminy and trigeminy.   CARDIOPULMONARY EXERCISE TEST (CPX)  05/09/2020   (done with Ex St Echo): Normal Functional Capacity. No indication for CP limitations.  Body Habitus contributes to Exercise Intolerance.   EXERCISE STRESS ECHO  05/09/2020   TTE shows normal EF 65% with LVH No pericardial effusion Normal RV size and function Moderate appearing rheumatic MR/AR.;; -- NO COMMENT ON EXERCISE EFFECT ON MR!!! (even though this was the reason for ordering the test)   EYE SURGERY N/A    Phreesia 11/06/2019   KNEE ARTHROSCOPY Right    Left knee open surgery     pt thinks she only had shots in L knee, not surgery   RIGHT/LEFT HEART CATH AND CORONARY ANGIOGRAPHY N/A 03/28/2020   RIGHT/LEFT HEART CATH AND CORONARY ANGIOGRAPHY;  Marykay Lex, MD;  Location: [facility] INVASIVE CV LAB; angiographically normal coronary arteries.  Cloacal LM. Normal RHC Pressures (PAP 40/13 - mean 22 mmHg, PCWP , V wave 25 mmHg c/w MR, LVEDP 15 mmHg); CO/CI 5.02 / 2.45 (reduced).   TEE WITHOUT CARDIOVERSION N/A 03/13/2020   Procedure: TRANSESOPHAGEAL ECHOCARDIOGRAM (TEE);  Surgeon: Sande Rives, MD;  [facility] ENDOSCOPY;;; Severe MR 2/2 restricted P3 scallop of PMVL (IIIB) w/ incomplete leaflet  coaptation.  Thickened /degenerative leaflets c/w Rheumatic Valvular Heart Disease.  2D PISA radius 0.9 cm with 2D ERO 0.26 cm2 with R Vol 62 cc. PV blunting w/ BP 109/60 mmHg. No MS. Mild-mod AI / Mild AS. Gr 2 plaque Aorta   TOTAL ABDOMINAL HYSTERECTOMY     TRANSTHORACIC ECHOCARDIOGRAM  05/09/2021   EF 60 to 65%.  No RWMA.  GR 1 DD with elevated LAP-moderately dilated left atrium (LA).  Normal RV size and function.  Moderate to severe MR with no MS.  Moderate aortic calcification/sclerosis with only mild stenosis.  Ascending aorta 39 mm.   TRANSTHORACIC ECHOCARDIOGRAM  01/2018   a) Normal LV size.  Mod Conc LVH.  EF 60-65%.  No R WMA.  GR 1 DD (high P).  Mild AS (MG 16 mmHg) w/ Mod AI.  Severe LA dilation.  Mild MR.; 01/2020:  EF 65-70%. No RWMA.  Severe MR w/  restricted PMVL movement.  (Recommend TEE).  Mod AoV thickening w/ mild to mod AS & Mod AI.  Nl RV fxn.  Mildly elevated filling P. Severe LA dilation.   Zio Patch Monitor  05/2021   Predominant Rhythm Is Sinus Rhythm - HR range 45 -139 bpm & avg 74 bpm.  Occ PACs (2.9%) w/ some couplets and triplets,& rare PVCs. 20 Atrial Runs: Fastest - 6 beats w/ rate 235 bpm; Longest 12 beats @ 138 bpm (~ 5.2 sec); Symptoms were noted with PACs and PVCs not with atrial tachycardia runs.    Social History   Socioeconomic History   Marital status: Married    Spouse name: Molly Maduro   Number of children: 2   Years of education: Not on file   Highest education level: Not on file  Occupational History   Occupation: production    Employer: Publishing rights manager    Comment: check printing/shipping  Tobacco Use   Smoking status: Never   Smokeless tobacco: Never  Vaping Use   Vaping status: Never Used  Substance and Sexual Activity   Alcohol use: No   Drug use: No   Sexual activity: Never  Other Topics Concern   Not on file  Social History Narrative   Lives with her husband.  Their children live nearby.   Right handed   Caffeine: 1 cup/day   Social  Determinants of Health   Financial Resource Strain: Low Risk  (09/02/2022)   Overall Financial Resource Strain (CARDIA)    Difficulty of Paying Living Expenses: Not hard at all  Food Insecurity: No Food Insecurity (09/02/2022)   Hunger Vital Sign    Worried About Running Out of Food in the Last Year: Never true    Ran Out of Food in the Last Year: Never true  Transportation Needs: No Transportation Needs (09/02/2022)   PRAPARE - Administrator, Civil Service (Medical): No    Lack of Transportation (Non-Medical): No  Physical Activity: Insufficiently Active (09/02/2022)   Exercise Vital Sign    Days of Exercise per Week: 3 days    Minutes of Exercise per Session: 20 min  Stress: Stress Concern Present (09/02/2022)   [employer] of Occupational Health - Occupational Stress Questionnaire    Feeling of Stress : To some extent  Social Connections: Moderately Isolated (09/02/2022)   Social Connection and Isolation Panel [NHANES]    Frequency of Communication with Friends and Family: Three times a week    Frequency of Social Gatherings with Friends and Family: Three times a week    Attends Religious Services: Never    Active Member of Clubs or Organizations: No    Attends Banker Meetings: Never    Marital Status: Married  Catering manager Violence: Not At Risk (09/02/2022)   Humiliation, Afraid, Rape, and Kick questionnaire    Fear of Current or Ex-Partner: No    Emotionally Abused: No    Physically Abused: No    Sexually Abused: No    Family History  Problem Relation Age of Onset   Arthritis Mother    Hyperlipidemia Mother    Diabetes Mother    Gout Mother    Hypertension Mother    Dementia Mother    Cancer Father        GI cancer   Hypertension Brother    Diabetes Brother    Hypertension Brother    Diabetes Maternal Grandfather    Migraines Neg Hx    Headache Neg Hx  Review of Systems  Constitutional: Negative.  Negative for chills and fever.   HENT: Negative.  Negative for congestion and sore throat.   Respiratory: Negative.  Negative for cough and shortness of breath.   Cardiovascular: Negative.  Negative for chest pain and palpitations.  Gastrointestinal:  Negative for abdominal pain, diarrhea, nausea and vomiting.  Genitourinary: Negative.  Negative for dysuria and hematuria.  Skin: Negative.  Negative for rash.  Neurological: Negative.  Negative for dizziness and headaches.  All other systems reviewed and are negative.   Vitals:   01/06/23 0956  BP: 128/82  Pulse: (!) 106  Temp: 98.5 F (36.9 C)  SpO2: 97%    Physical Exam Vitals reviewed.  Constitutional:      Appearance: Normal appearance.  HENT:     Head: Normocephalic.  Eyes:     Extraocular Movements: Extraocular movements intact.  Cardiovascular:     Rate and Rhythm: Normal rate and regular rhythm.     Heart sounds: Murmur heard.  Pulmonary:     Effort: Pulmonary effort is normal.     Breath sounds: Normal breath sounds.  Abdominal:     Palpations: Abdomen is soft.     Tenderness: There is no abdominal tenderness.  Skin:    General: Skin is warm and dry.     Capillary Refill: Capillary refill takes less than 2 seconds.  Neurological:     General: No focal deficit present.     Mental Status: She is alert and oriented to [place], place, and time.  Psychiatric:        Mood and Affect: Mood normal.        Behavior: Behavior normal.      ASSESSMENT & PLAN: A total of 48 minutes was spent with the patient and counseling/coordination of care regarding preparing for this visit, review of most recent office visit notes, review of most recent neurologist office visit notes, review of most recent brain MRI report, review of most recent EEG report, diagnosis of seizure disorder and need for medication and neurology follow-up, review of chronic medical conditions under management, review of all medications, review of most recent blood work results,  prognosis, documentation, and need for follow-up.  Problem List Items Addressed This Visit       Cardiovascular and Mediastinum   Essential hypertension (Chronic)    BP Readings from Last 3 Encounters:  01/06/23 128/82  12/11/22 122/66  07/16/22 122/72  Well-controlled hypertension Continue valsartan 320 mg daily, metoprolol succinate 25 mg daily, chlorthalidone 25 mg daily Cardiovascular risks associated with hypertension discussed       Valvular heart disease (Chronic)    Clinically stable.  No signs of congestive heart failure.        Nervous and Auditory   Seizure disorder (HCC) - Primary    Abnormal EEG done recently Normal brain MRI.  Report reviewed with patient Most recent neurologist office visit assessment and plan reviewed with patient and daughter Has had a couple of recent minor seizures with postictal state. Will need medication. Has follow-up appointment with neurologist in 2 weeks        Other   Dyslipidemia (Chronic)    Chronic stable condition Continue rosuvastatin 10 mg daily Diet and nutrition discussed      Other Visit Diagnoses     Need for vaccination       Relevant Orders   Flu Vaccine Trivalent High Dose (Fluad)      Patient Instructions  Seizure, Adult A seizure is a  sudden burst of abnormal electrical and chemical activity in the brain. Seizures usually last from 30 seconds to 2 minutes.  What are the causes? Common causes of this condition include: Fever or infection. Problems that affect the brain. These may include: A brain or head injury. Bleeding in the brain. A brain tumor. Low levels of blood sugar or salt. Kidney problems or liver problems. Conditions that are passed from parent to child (are inherited). Problems with a substance, such as: Having a reaction to a drug or a medicine. Stopping the use of a substance all of a sudden (withdrawal). A stroke. Disorders that affect how you develop. Sometimes, the cause may  not be known.  What increases the risk? Having someone in your family who has epilepsy. In this condition, seizures happen again and again over time. They have no clear cause. Having had a tonic-clonic seizure before. This type of seizure causes you to: Tighten the muscles of the whole body. Lose consciousness. Having had a head injury or strokes before. Having had a lack of oxygen at birth. What are the signs or symptoms? There are many types of seizures. The symptoms vary depending on the type of seizure you have. Symptoms during a seizure Shaking that you cannot control (convulsions) with fast, jerky movements of muscles. Stiffness of the body. Breathing problems. Feeling mixed up (confused). Staring or not responding to sound or touch. Head nodding. Eyes that blink, flutter, or move fast. Drooling, grunting, or making clicking sounds with your mouth Losing control of when you pee or poop. Symptoms before a seizure Feeling afraid, nervous, or worried. Feeling like you may vomit. Feeling like: You are moving when you are not. Things around you are moving when they are not. Feeling like you saw or heard something before (dj vu). Odd tastes or smells. Changes in how you see. You may see flashing lights or spots. Symptoms after a seizure Feeling confused. Feeling sleepy. Headache. Sore muscles. How is this treated? If your seizure stops on its own, you will not need treatment. If your seizure lasts longer than 5 minutes, you will normally need treatment. Treatment may include: Medicines given through an IV tube. Avoiding things, such as medicines, that are known to cause your seizures. Medicines to prevent seizures. A device to prevent or control seizures. Surgery. A diet low in carbohydrates and high in fat (ketogenic diet). Follow these instructions at home: Medicines Take over-the-counter and prescription medicines only as told by your doctor. Avoid foods or drinks  that may keep your medicine from working, such as alcohol. Activity Follow instructions about driving, swimming, or doing things that would be dangerous if you had another seizure. Wait until your doctor says it is safe for you to do these things. If you live in the [place] ask your local department of motor vehicles when you can drive. Get a lot of rest. Teaching others  Teach friends and family what to do when you have a seizure. They should: Help you get down to the ground. Protect your head and body. Loosen any clothing around your neck. Turn you on your side. Know whether or not you need emergency care. Stay with you until you are better. Also, tell them what not to do if you have a seizure. Tell them: They should not hold you down. They should not put anything in your mouth. General instructions Avoid anything that gives you seizures. Keep a seizure diary. Write down: What you remember about each seizure. What you  think caused each seizure. Keep all follow-up visits. Contact a doctor if: You have another seizure or seizures. Call the doctor each time you have a seizure. The pattern of your seizures changes. You keep having seizures with treatment. You have symptoms of being sick or having an infection. You are not able to take your medicine. Get help right away if: You have any of these problems: A seizure that lasts longer than 5 minutes. Many seizures in a row and you do not feel better between seizures. A seizure that makes it harder to breathe. A seizure and you can no longer speak or use part of your body. You do not wake up right after a seizure. You get hurt during a seizure. You feel confused or have pain right after a seizure. These symptoms may be an emergency. Get help right away. Call your local emergency services (911 in the [place] Do not wait to see if the symptoms will go away. Do not drive yourself to the hospital. Summary A seizure is a sudden burst of  abnormal electrical and chemical activity in the brain. Seizures normally last from 30 seconds to 2 minutes. Causes of seizures include illness, injury to the head, low levels of blood sugar or salt, and certain conditions. Most seizures will stop on their own in less than 5 minutes. Seizures that last longer than 5 minutes are a medical emergency and need treatment right away. Many medicines are used to treat seizures. Take over-the-counter and prescription medicines only as told by your doctor. This information is not intended to replace advice given to you by your health care provider. Make sure you discuss any questions you have with your health care provider. Document Revised: 10/18/2019 Document Reviewed: 10/20/2019 Elsevier Patient Education  2024 Elsevier Inc.     Edwina Barth, MD [facility]

## 2023-01-06 NOTE — Assessment & Plan Note (Signed)
Abnormal EEG done recently Normal brain MRI.  Report reviewed with patient Most recent neurologist office visit assessment and plan reviewed with patient and daughter Has had a couple of recent minor seizures with postictal state. Will need medication. Has follow-up appointment with neurologist in 2 weeks

## 2023-01-06 NOTE — Assessment & Plan Note (Signed)
Clinically stable.  No signs of congestive heart failure.

## 2023-01-20 ENCOUNTER — Ambulatory Visit (INDEPENDENT_AMBULATORY_CARE_PROVIDER_SITE_OTHER): Payer: PPO | Admitting: Neurology

## 2023-01-20 ENCOUNTER — Encounter: Payer: Self-pay | Admitting: Neurology

## 2023-01-20 VITALS — BP 111/62 | HR 62 | Ht 65.0 in | Wt 194.0 lb

## 2023-01-20 DIAGNOSIS — G40209 Localization-related (focal) (partial) symptomatic epilepsy and epileptic syndromes with complex partial seizures, not intractable, without status epilepticus: Secondary | ICD-10-CM

## 2023-01-20 MED ORDER — LEVETIRACETAM 500 MG PO TABS
500.0000 mg | ORAL_TABLET | Freq: Two times a day (BID) | ORAL | 6 refills | Status: DC
Start: 2023-01-20 — End: 2023-05-04

## 2023-01-20 NOTE — Progress Notes (Signed)
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 working on ladders or at heights. Take showers instead of baths. Ensure the water temperature is not too high on the home water heater. Do not go swimming alone. Do not lock yourself in a room alone (i.e. bathroom). When caring for infants or small children, sit down when holding, feeding, or changing them to minimize risk of injury to the child in the event you have a seizure. Maintain good sleep hygiene. Avoid alcohol.      No orders of the defined types were placed in this encounter.    Cc: Georgina Quint, *,    Naomie Dean, MD  [facility] [address] [place], [place] [address]  Phone [phone] Fax [phone]  I spent 40 minutes of face-to-face and non-face-to-face time with patient on the  1. Partial symptomatic epilepsy with complex partial seizures, not intractable, without status epilepticus (HCC)      diagnosis.  This included previsit chart  review, lab review, study review, order entry, electronic health record documentation, patient education on the different diagnostic and therapeutic options, counseling and coordination of care, risks and benefits of management, compliance, or risk factor reduction

## 2023-01-20 NOTE — Patient Instructions (Addendum)
Complex partial seizures are defined by an episode of impaired consciousness with altered behavior, sensation, or motor activity that can last from 30 seconds to >2 minutes. The motor activity may consist of repetitive automatic movements of the face or limbs. Partial seizures with secondary generalization occur when the seizure initially localized to one limited area spreads to involve both sides of the brain and evolves into a tonic-clonic or "grand mal" seizure.  Epilepsy: Definition A condition where a Lisa Miles has two or more unprovoked seizures that occur more than 24 hours apart  Meds ordered this encounter  Medications   [name] (KEPPRA) 500 MG tablet    Sig: Take 1 tablet (500 mg total) by mouth 2 (two) times daily.    Dispense:  60 tablet    Refill:  6   Per [facility] statutes, patients with seizures are not allowed to drive until they have been seizure-free for six months.    Use caution when using heavy equipment or power tools. Avoid working on ladders or at heights. Take showers instead of baths. Ensure the water temperature is not too high on the home water heater. Do not go swimming alone. Do not lock yourself in a room alone (i.e. bathroom). When caring for infants or small children, sit down when holding, feeding, or changing them to minimize risk of injury to the child in the event you have a seizure. Maintain good sleep hygiene. Avoid alcohol.    If patient has another seizure, call 911 and bring them back to the ED if: A.  The seizure lasts longer than 5 minutes.      B.  The patient doesn't wake shortly after the seizure or has new problems such as difficulty seeing, speaking or moving following the seizure C.  The patient was injured during the seizure D.  The patient has a temperature over 102 F (39C) E.  The patient vomited during the seizure and now is having trouble breathing  Per [facility] statutes, patients with seizures are not allowed to  drive until they have been seizure-free for six months.  Other recommendations include using caution when using heavy equipment or power tools. Avoid working on ladders or at heights. Take showers instead of baths.  Do not swim alone.  Ensure the water temperature is not too high on the home water heater. Do not go swimming alone. Do not lock yourself in a room alone (i.e. bathroom). When caring for infants or small children, sit down when holding, feeding, or changing them to minimize risk of injury to the child in the event you have a seizure. Maintain good sleep hygiene. Avoid alcohol.  Also recommend adequate sleep, hydration, good diet and minimize stress.  During the Seizure  - First, ensure adequate ventilation and place patients on the floor on their left side  Loosen clothing around the neck and ensure the airway is patent. If the patient is clenching the teeth, do not force the mouth open with any object as this can cause severe damage - Remove all items from the surrounding that can be hazardous. The patient may be oblivious to what's happening and may not even know what he or she is doing. If the patient is confused and wandering, either gently guide him/her away and block access to outside areas - Reassure the individual and be comforting - Call 911. In most cases, the seizure ends before EMS arrives. However, there are cases when seizures may last over 3 to 5  minutes. Or the individual may have developed breathing difficulties or severe injuries. If a pregnant patient or a [name] with diabetes develops a seizure, it is prudent to call an ambulance. - Finally, if the patient does not regain full consciousness, then call EMS. Most patients will remain confused for about 45 to 90 minutes after a seizure, so you must use judgment in calling for help. - Avoid restraints but make sure the patient is in a bed with padded side rails - Place the individual in a lateral position with the neck  slightly flexed; this will help the saliva drain from the mouth and prevent the tongue from falling backward - Remove all nearby furniture and other hazards from the area - Provide verbal assurance as the individual is regaining consciousness - Provide the patient with privacy if possible - Call for help and start treatment as ordered by the caregiver   fter the Seizure (Postictal Stage)  After a seizure, most patients experience confusion, fatigue, muscle pain and/or a headache. Thus, one should permit the individual to sleep. For the next few days, reassurance is essential. Being calm and helping reorient the Ble[place] is also of importance.  Most seizures are painless and end spontaneously. Seizures are not harmful to others but can lead to complications such as stress on the lungs, brain and the heart. Individuals with prior lung problems may develop labored breathing and respiratory distress.    [name] Tablets What is this medication? [name] (lee ve tye RA se tam) prevents and controls seizures in people with epilepsy. It works by calming overactive nerves in your body. This medicine may be used for other purposes; ask your health care provider or pharmacist if you have questions. COMMON BRAND NAME(S): Keppra, Roweepra What should I tell my care team before I take this medication? They need to know if you have any of these conditions: Kidney disease Suicidal thoughts, plans, or attempt by you or a family member An unusual or allergic reaction to levetiracetam, other medications, foods, dyes, or preservatives Pregnant or trying to get pregnant Breast-feeding How should I use this medication? Take this medication by mouth with a glass of water. Follow the directions on the prescription label. Swallow the tablets whole. Do not crush or chew this medication. You may take this medication with or without food. Take your doses at regular intervals. Do not take your medication more  often than directed. Do not stop taking this medication or any of your seizure medications unless instructed by your care team. Stopping your medication suddenly can increase your seizures or their severity. A special MedGuide will be given to you by the pharmacist with each prescription and refill. Be sure to read this information carefully each time. Contact your care team about the use of this medication in children. While this medication may be prescribed for children as young as 66 years of age for selected conditions, precautions do apply. Overdosage: If you think you have taken too much of this medicine contact a poison control center or emergency room at once. NOTE: This medicine is only for you. Do not share this medicine with others. What if I miss a dose? If you miss a dose, take it as soon as you can. If it is almost time for your next dose, take only that dose. Do not take double or extra doses. What may interact with this medication? This medication may interact with the following: Carbamazepine Colesevelam Probenecid Sevelamer This list may not describe all possible  interactions. Give your health care provider a list of all the medicines, herbs, non-prescription drugs, or dietary supplements you use. Also tell them if you smoke, drink alcohol, or use illegal drugs. Some items may interact with your medicine. What should I watch for while using this medication? Visit your care team for a regular check on your progress. Wear a medical identification bracelet or chain to say you have epilepsy, and carry a card that lists all your medications. This medication may cause serious skin reactions. They can happen weeks to months after starting the medication. Contact your care team right away if you notice fevers or flu-like symptoms with a rash. The rash may be red or purple and then turn into blisters or peeling of the skin. You may also notice a red rash with swelling of the face, lips, or  lymph nodes in your neck or under your arms. It is important to take this medication exactly as instructed by your care team. When first starting treatment, your dose may need to be adjusted. It may take weeks or months before your dose is stable. You should contact your care team if your seizures get worse or if you have any new types of seizures. This medication may affect your coordination, reaction time, or judgment. Do not drive or operate machinery until you know how this medication affects you. Sit up or stand slowly to reduce the risk of dizzy or fainting spells. Drinking alcohol with this medication can increase the risk of these side effects. This medication may cause thoughts of suicide or depression. This includes sudden changes in mood, behaviors, or thoughts. These changes can happen at any time but are more common in the beginning of treatment or after a change in dose. Call your care team right away if you experience these thoughts or worsening depression. If you become pregnant while using this medication, you may enroll in the [place] [employer] Antiepileptic Drug Pregnancy Registry by calling [phone]. This registry collects information about the safety of antiepileptic medication use during pregnancy. What side effects may I notice from receiving this medication? Side effects that you should report to your care team as soon as possible: Allergic reactions or angioedema--skin rash, itching or hives, swelling of the face, eyes, lips, tongue, arms, or legs, trouble swallowing or breathing Increase in blood pressure in children Infection--fever, chills, cough, or sore throat Loss of balance or coordination Low red blood cell level--unusual weakness or fatigue, dizziness, headache, trouble breathing Mood and behavior changes--anxiety, nervousness, confusion, hallucinations, irritability, hostility, thoughts of suicide or self-harm, worsening mood, feelings of depression Rash, fever, and  swollen lymph nodes Redness, swelling, and blistering of the skin over hands and feet Trouble walking Unusual bruising or bleeding Unusual weakness or fatigue Side effects that usually do not require medical attention (report these to your care team if they continue or are bothersome): Dizziness Drowsiness Fatigue Irritability Loss of appetite This list may not describe all possible side effects. Call your doctor for medical advice about side effects. You may report side effects to FDA at 1-800-FDA-1088. Where should I keep my medication? Keep out of reach of children. Store at room temperature between 15 and 30 degrees C (59 and 86 degrees F). Throw away any unused medication after the expiration date. NOTE: This sheet is a summary. It may not cover all possible information. If you have questions about this medicine, talk to your doctor, pharmacist, or health care provider.  2024 Elsevier/Gold Standard (2022-03-26 00:00:00)

## 2023-01-21 NOTE — Telephone Encounter (Signed)
Per Dr. Lucia Gaskins, FMLA form has been completed for the patient's daughter to have time off with the patient from 12-24-2022 through 04-27-2023 and after that allowing 4 days per month for appointments for a year. Form sent to medical records for processing.

## 2023-02-06 ENCOUNTER — Other Ambulatory Visit: Payer: Self-pay | Admitting: Cardiology

## 2023-02-27 ENCOUNTER — Other Ambulatory Visit: Payer: Self-pay | Admitting: Emergency Medicine

## 2023-02-27 DIAGNOSIS — E785 Hyperlipidemia, unspecified: Secondary | ICD-10-CM

## 2023-03-16 ENCOUNTER — Emergency Department (HOSPITAL_COMMUNITY): Payer: PPO

## 2023-03-16 ENCOUNTER — Encounter (HOSPITAL_COMMUNITY): Payer: Self-pay

## 2023-03-16 ENCOUNTER — Other Ambulatory Visit: Payer: Self-pay

## 2023-03-16 ENCOUNTER — Emergency Department (HOSPITAL_COMMUNITY)
Admission: EM | Admit: 2023-03-16 | Discharge: 2023-03-17 | Disposition: A | Discharge status: Home / Self Care | Payer: PPO | Attending: Emergency Medicine | Admitting: Emergency Medicine

## 2023-03-16 DIAGNOSIS — E042 Nontoxic multinodular goiter: Secondary | ICD-10-CM | POA: Diagnosis not present

## 2023-03-16 DIAGNOSIS — R911 Solitary pulmonary nodule: Secondary | ICD-10-CM | POA: Diagnosis not present

## 2023-03-16 DIAGNOSIS — Z471 Aftercare following joint replacement surgery: Secondary | ICD-10-CM | POA: Diagnosis not present

## 2023-03-16 DIAGNOSIS — E041 Nontoxic single thyroid nodule: Secondary | ICD-10-CM | POA: Insufficient documentation

## 2023-03-16 DIAGNOSIS — R918 Other nonspecific abnormal finding of lung field: Secondary | ICD-10-CM | POA: Diagnosis not present

## 2023-03-16 DIAGNOSIS — M542 Cervicalgia: Secondary | ICD-10-CM | POA: Diagnosis not present

## 2023-03-16 LAB — CBC
HCT: 37 % (ref 36.0–46.0)
Hemoglobin: 12.4 g/dL (ref 12.0–15.0)
MCH: 29.5 pg (ref 26.0–34.0)
MCHC: 33.5 g/dL (ref 30.0–36.0)
MCV: 88.1 fL (ref 80.0–100.0)
Platelets: 220 10*3/uL (ref 150–400)
RBC: 4.2 MIL/uL (ref 3.87–5.11)
RDW: 13 % (ref 11.5–15.5)
WBC: 5.3 10*3/uL (ref 4.0–10.5)
nRBC: 0 % (ref 0.0–0.2)

## 2023-03-16 LAB — BASIC METABOLIC PANEL
Anion gap: 7 (ref 5–15)
BUN: 13 mg/dL (ref 8–23)
CO2: 26 mmol/L (ref 22–32)
Calcium: 9.2 mg/dL (ref 8.9–10.3)
Chloride: 106 mmol/L (ref 98–111)
Creatinine, Ser: 0.82 mg/dL (ref 0.44–1.00)
GFR, Estimated: >60 mL/min (ref >60)
Glucose, Bld: 125 mg/dL — ABNORMAL HIGH (ref 70–99)
Potassium: 3.8 mmol/L (ref 3.5–5.1)
Sodium: 139 mmol/L (ref 135–145)

## 2023-03-16 MED ORDER — IOHEXOL 350 MG/ML SOLN
75.0000 mL | Freq: Once | INTRAVENOUS | Status: AC | PRN
  Administered 2023-03-16: 75 mL via INTRAVENOUS

## 2023-03-16 NOTE — ED Notes (Signed)
Pt brought back from lobby to room 33. Is accompanied by spouse. Pt reports ongoing left sided pain. No respiratory distress. Pt on monitor. Provided with warm blanket. Call bell within reach. Denies any additional needs at this time

## 2023-03-16 NOTE — ED Triage Notes (Signed)
Pt c/o knot to L throat x 2 days, painful to swallow; no hx of same; denies fevers

## 2023-03-17 DIAGNOSIS — E041 Nontoxic single thyroid nodule: Secondary | ICD-10-CM | POA: Diagnosis not present

## 2023-03-17 MED ORDER — DEXAMETHASONE SODIUM PHOSPHATE 4 MG/ML IJ SOLN
4.0000 mg | Freq: Once | INTRAMUSCULAR | Status: AC
  Administered 2023-03-17: 4 mg via INTRAMUSCULAR
  Filled 2023-03-17: qty 1

## 2023-03-17 MED ORDER — LIDOCAINE VISCOUS HCL 2 % MT SOLN
15.0000 mL | Freq: Once | OROMUCOSAL | Status: AC
  Administered 2023-03-17: 15 mL via OROMUCOSAL
  Filled 2023-03-17: qty 15

## 2023-03-17 NOTE — ED Provider Notes (Signed)
[facility]MENT AT [facility] Provider Note   CSN: [id number] Arrival date & time: 03/16/23  1716     History  No chief complaint on file.   Lisa Miles is a 72 y.o. female.  The history is provided by the patient.  Sore Throat This is a new problem. The current episode started more than 2 days ago. The problem occurs constantly. The problem has not changed since onset.Pertinent negatives include no chest pain, no abdominal pain, no headaches and no shortness of breath. Nothing aggravates the symptoms. Nothing relieves the symptoms. She has tried nothing for the symptoms. The treatment provided no relief.  Patient presents with left sore throat and bump on left neck that she has noticed this week.  No difficulty breathing or with phonation.      Past Medical History:  Diagnosis Date   Aortic stenosis, mild-moderate 07/2012   TTE October 21: Moderate aortic valve thickening with mild to moderate AI & mild AS; TEE: mild to moderate AI & MIld AS   Arthritis    Phreesia 11/06/2019   Asthma    Bilateral bunions    Bunionectomies performed   Bronchitis, chronic (HCC)    Cataract    Phreesia 11/06/2019   Heart disease    Hyperlipidemia    Hypertension    Good control   Inguinodynia    Bilateral groin pain   Lower extremity edema    Chronic. Venous Duplex 11/06/11 SUMMARY: 1) Bilateral Lower Extremities: No evidence of DVT or thrombophlebitis.  2) Right Common Femoral Vein: Demonstrated mild valvular insufficiency with a greater than (1) sec of duration. Mildly abnormal LE Venous duplex Doppler.   Obesity    Palpitations    Relatively well controlled   Scoliosis    DG Chest 2 View x-ray on 07/30/11 by Dr. Cleta Alberts shows a scoliosis.   Severe mitral regurgitation by prior echocardiogram 07/2012   Mild AMVL prolapse, Mod MR -- no MVP noted in 01/2018 -> 11/'21: Severe MR due to restricted movement of P3 scallop of PMVL (IIIB) due to incomplete leaflet  coaptation -> thickened leaflets consistent with Rheumatic Heart Valve Disease.     Home Medications Prior to Admission medications   Medication Sig Start Date End Date Taking? Authorizing Provider  amoxicillin (AMOXIL) 500 MG tablet Take 500 mg by mouth. Take 4 tablets before dental procedures.    [provider]  aspirin EC 81 MG tablet Take 81 mg by mouth daily. Swallow whole.    [provider]  calcium carbonate (CALCIUM 600) 600 MG TABS tablet     [provider]  chlorthalidone (HYGROTON) 25 MG tablet TAKE ONE-HALF OF A TABLET BY MOUTH DAILY 02/08/23   Marykay Lex, MD  Cholecalciferol (VITAMIN D) 50 MCG (2000 UT) CAPS Take 2,000 Units by mouth daily.    [provider]  esomeprazole (NEXIUM) 20 MG capsule Take 1 capsule (20 mg total) by mouth 2 (two) times daily before a meal. 12/11/22   Zehr, Shanda Bumps D, PA-C  hydroxypropyl methylcellulose / hypromellose (ISOPTO TEARS / GONIOVISC) 2.5 % ophthalmic solution Place 1 drop into both eyes in the morning and at bedtime.    [provider]  ibuprofen (ADVIL) 600 MG tablet Take 600 mg by mouth every 8 (eight) hours as needed.    [provider]  [name] (KEPPRA) 500 MG tablet Take 1 tablet (500 mg total) by mouth 2 (two) times daily. 01/20/23   Anson Fret, MD  metoprolol succinate (TOPROL-XL) 25 MG 24 hr tablet TAKE 1 TABLET(25 MG) BY MOUTH DAILY 12/29/22   Marykay Lex, MD  rosuvastatin (CRESTOR) 10 MG tablet TAKE 1 TABLET(10 MG) BY MOUTH DAILY 02/27/23   Georgina Quint, MD  traMADol (ULTRAM) 50 MG tablet Take 50 mg by mouth every 6 (six) hours as needed.    [provider]  valsartan (DIOVAN) 320 MG tablet TAKE 1 TABLET(320 MG) BY MOUTH DAILY 12/20/22   Georgina Quint, MD      Allergies    Patient has no known allergies.    Review of Systems   Review of Systems  Constitutional:  Negative for fever.  HENT:  Positive for sore throat. Negative for  facial swelling, trouble swallowing and voice change.   Respiratory:  Negative for shortness of breath, wheezing and stridor.   Cardiovascular:  Negative for chest pain.  Gastrointestinal:  Negative for abdominal pain.  Musculoskeletal:  Positive for neck pain.  Neurological:  Negative for headaches.  All other systems reviewed and are negative.   Physical Exam Updated Vital Signs BP 129/68   Pulse (!) 57   Temp 97.6 F (36.4 C) (Oral)   Resp 15   SpO2 92%  Physical Exam Vitals and nursing note reviewed.  Constitutional:      General: She is not in acute distress.    Appearance: Normal appearance. She is well-developed.  HENT:     Head: Normocephalic and atraumatic.     Nose: Nose normal.     Mouth/Throat:     Mouth: Mucous membranes are moist.  Eyes:     Pupils: Pupils are equal, round, and reactive to light.  Neck:     Comments: Thyroid nodule  Cardiovascular:     Rate and Rhythm: Normal rate and regular rhythm.     Pulses: Normal pulses.     Heart sounds: Normal heart sounds.  Pulmonary:     Effort: Pulmonary effort is normal. No respiratory distress.     Breath sounds: Normal breath sounds.  Abdominal:     General: Bowel sounds are normal. There is no distension.     Palpations: Abdomen is soft.     Tenderness: There is no abdominal tenderness. There is no guarding or rebound.  Musculoskeletal:        General: Normal range of motion.     Cervical back: Neck supple. No tenderness.  Lymphadenopathy:     Cervical: No cervical adenopathy.  Skin:    General: Skin is warm and dry.     Capillary Refill: Capillary refill takes less than 2 seconds.     Findings: No erythema or rash.  Neurological:     General: No focal deficit present.     Mental Status: She is alert and oriented to [place], place, and time.     Deep Tendon Reflexes: Reflexes normal.  Psychiatric:        Mood and Affect: Mood normal.     ED Results / Procedures / Treatments   Labs (all labs  ordered are listed, but only abnormal results are displayed) Results for orders placed or performed during the hospital encounter of 03/16/23  CBC  Result Value Ref Range   WBC 5.3 4.0 - 10.5 K/uL   RBC 4.20 3.87 - 5.11 MIL/uL   Hemoglobin 12.4 12.0 - 15.0 g/dL   HCT 69.6 29.5 - 28.4 %   MCV 88.1 80.0 - 100.0 fL   MCH 29.5 26.0 - 34.0 pg  MCHC 33.5 30.0 - 36.0 g/dL   RDW 16.1 09.6 - 04.5 %   Platelets 220 150 - 400 K/uL   nRBC 0.0 0.0 - 0.2 %  Basic metabolic panel  Result Value Ref Range   Sodium 139 135 - 145 mmol/L   Potassium 3.8 3.5 - 5.1 mmol/L   Chloride 106 98 - 111 mmol/L   CO2 26 22 - 32 mmol/L   Glucose, Bld 125 (H) 70 - 99 mg/dL   BUN 13 8 - 23 mg/dL   Creatinine, Ser 4.09 0.44 - 1.00 mg/dL   Calcium 9.2 8.9 - 81.1 mg/dL   GFR, Estimated >91 >47 mL/min   Anion gap 7 5 - 15   CT Soft Tissue Neck W Contrast  Result Date: 03/16/2023 CLINICAL DATA:  Soft tissue infection suspected, knot in left throat for 2 days, painful to swallow EXAM: CT NECK WITH CONTRAST TECHNIQUE: Multidetector CT imaging of the neck was performed using the standard protocol following the bolus administration of intravenous contrast. RADIATION DOSE REDUCTION: This exam was performed according to the departmental dose-optimization program which includes automated exposure control, adjustment of the mA and/or kV according to patient size and/or use of iterative reconstruction technique. CONTRAST:  75mL OMNIPAQUE IOHEXOL 350 MG/ML SOLN COMPARISON:  None Available. FINDINGS: Pharynx and larynx: Normal. No mass or swelling. Salivary glands: No inflammation, mass, or stone. Thyroid: 3.5 cm hypodense mass in the left thyroid lobe (series 3, image 89), which causes rightward tracheal deviation and likely mass effect on esophagus. Additional smaller nodules are also seen in the thyroid. Lymph nodes: None enlarged or abnormal density. Vascular: Patent major arteries and veins the neck. Limited intracranial:  Negative. Visualized orbits: No acute finding. Status post bilateral lens replacements. Mastoids and visualized paranasal sinuses: Clear. Skeleton: No acute osseous abnormality. Upper chest: 4 mm solid nodule in the left upper lobe (series 3, image 140). 5 mm solid nodule in the inferior right upper lobe (series 3, image 118). No pleural effusion. Other: None. IMPRESSION: 1. 3.5 cm hypodense mass in the left thyroid lobe, which causes rightward tracheal deviation and likely mass effect on the esophagus. Correlate with the location of the patient's perceived "knot." The thyroid was last evaluated with ultrasound on 05/28/2014. Recommend non emergent thyroid ultrasound for further evaluation. 2. Bilateral upper lobe pulmonary nodules measuring up to 5 mm. No follow-up needed if patient is low-risk (and has no known or suspected primary neoplasm). Non-contrast chest CT can be considered in 12 months if patient is high-risk. This recommendation follows the consensus statement: Guidelines for Management of Incidental Pulmonary Nodules Detected on CT Images: From the Fleischner Society 2017; Radiology 2017; 284:228-243. 3. No additional acute finding in the neck. Electronically Signed   By: Wiliam Ke M.D.   On: 03/16/2023 23:11    Radiology CT Soft Tissue Neck W Contrast  Result Date: 03/16/2023 CLINICAL DATA:  Soft tissue infection suspected, knot in left throat for 2 days, painful to swallow EXAM: CT NECK WITH CONTRAST TECHNIQUE: Multidetector CT imaging of the neck was performed using the standard protocol following the bolus administration of intravenous contrast. RADIATION DOSE REDUCTION: This exam was performed according to the departmental dose-optimization program which includes automated exposure control, adjustment of the mA and/or kV according to patient size and/or use of iterative reconstruction technique. CONTRAST:  75mL OMNIPAQUE IOHEXOL 350 MG/ML SOLN COMPARISON:  None Available. FINDINGS:  Pharynx and larynx: Normal. No mass or swelling. Salivary glands: No inflammation, mass, or stone. Thyroid:  3.5 cm hypodense mass in the left thyroid lobe (series 3, image 89), which causes rightward tracheal deviation and likely mass effect on esophagus. Additional smaller nodules are also seen in the thyroid. Lymph nodes: None enlarged or abnormal density. Vascular: Patent major arteries and veins the neck. Limited intracranial: Negative. Visualized orbits: No acute finding. Status post bilateral lens replacements. Mastoids and visualized paranasal sinuses: Clear. Skeleton: No acute osseous abnormality. Upper chest: 4 mm solid nodule in the left upper lobe (series 3, image 140). 5 mm solid nodule in the inferior right upper lobe (series 3, image 118). No pleural effusion. Other: None. IMPRESSION: 1. 3.5 cm hypodense mass in the left thyroid lobe, which causes rightward tracheal deviation and likely mass effect on the esophagus. Correlate with the location of the patient's perceived "knot." The thyroid was last evaluated with ultrasound on 05/28/2014. Recommend non emergent thyroid ultrasound for further evaluation. 2. Bilateral upper lobe pulmonary nodules measuring up to 5 mm. No follow-up needed if patient is low-risk (and has no known or suspected primary neoplasm). Non-contrast chest CT can be considered in 12 months if patient is high-risk. This recommendation follows the consensus statement: Guidelines for Management of Incidental Pulmonary Nodules Detected on CT Images: From the Fleischner Society 2017; Radiology 2017; 284:228-243. 3. No additional acute finding in the neck. Electronically Signed   By: Wiliam Ke M.D.   On: 03/16/2023 23:11    Procedures Procedures    Medications Ordered in ED Medications  iohexol (OMNIPAQUE) 350 MG/ML injection 75 mL (75 mLs Intravenous Contrast Given 03/16/23 1944)  lidocaine (XYLOCAINE) 2 % viscous mouth solution 15 mL (15 mLs Mouth/Throat Given 03/17/23  0103)  dexamethasone (DECADRON) injection 4 mg (4 mg Intramuscular Given 03/17/23 0103)    ED Course/ Medical Decision Making/ A&P                                 Medical Decision Making Patient with sore throat and neck swelling this week   Amount and/or Complexity of Data Reviewed Independent Historian: spouse    Details: See above  External Data Reviewed: notes.    Details: Previous notes reviewed  Labs: ordered.    Details: Normal white count 5.3, normal hemoglobin 12.4, normal platelets/  Normal sodium 139, normal potassium 3.8 normal creatinine  Radiology: ordered and independent interpretation performed. Discussion of management or test interpretation with external provider(s): Case d/w Dr. Elijah Birk, nothing to do at this time.  Follow up with PMD and ENT outpatient.  No medications   Risk Prescription drug management. Risk Details: This is a thyoroid nodule.  I have advised the patient she will need a dedicated outpatient thyroid US and close follow up with ENT,  this was also printed on patient's discharge paperwork.  Stable for discharge with close follow up.  Strict return     Final Clinical Impression(s) / ED Diagnoses Final diagnoses:  Thyroid nodule  Pulmonary nodule   Return for intractable cough, coughing up blood, fevers > 100.4 unrelieved by medication, shortness of breath, intractable vomiting, chest pain, shortness of breath, weakness, numbness, changes in speech, facial asymmetry, abdominal pain, passing out, Inability to tolerate liquids or food, cough, altered mental status or any concerns. No signs of systemic illness or infection. The patient is nontoxic-appearing on exam and vital signs are within normal limits.  I have reviewed the triage vital signs and the nursing notes. Pertinent labs & imaging results that  were available during my care of the patient were reviewed by me and considered in my medical decision making (see chart for details). After history,  exam, and medical workup I feel the patient has been appropriately medically screened and is safe for discharge home. Pertinent diagnoses were discussed with the patient. Patient was given return precautions.  Rx / DC Orders ED Discharge Orders     None         Marco Raper, MD 03/17/23 [phone]

## 2023-03-17 NOTE — ED Notes (Signed)
Discharge instructions provided by EDP on AVS were discussed with pt and pt's spouse at bedside. Pt was able to verbalize understanding no additional questions at this time. Pt wheelchair to car. Pt going home with husband at bedside

## 2023-03-22 ENCOUNTER — Ambulatory Visit (INDEPENDENT_AMBULATORY_CARE_PROVIDER_SITE_OTHER): Payer: PPO | Admitting: Emergency Medicine

## 2023-03-22 ENCOUNTER — Encounter: Payer: Self-pay | Admitting: Emergency Medicine

## 2023-03-22 VITALS — BP 120/68 | HR 57 | Temp 98.1°F | Ht 65.0 in | Wt 192.0 lb

## 2023-03-22 DIAGNOSIS — G40909 Epilepsy, unspecified, not intractable, without status epilepticus: Secondary | ICD-10-CM | POA: Diagnosis not present

## 2023-03-22 DIAGNOSIS — E041 Nontoxic single thyroid nodule: Secondary | ICD-10-CM | POA: Diagnosis not present

## 2023-03-22 DIAGNOSIS — I10 Essential (primary) hypertension: Secondary | ICD-10-CM

## 2023-03-22 DIAGNOSIS — I38 Endocarditis, valve unspecified: Secondary | ICD-10-CM

## 2023-03-22 DIAGNOSIS — E785 Hyperlipidemia, unspecified: Secondary | ICD-10-CM

## 2023-03-22 DIAGNOSIS — G4733 Obstructive sleep apnea (adult) (pediatric): Secondary | ICD-10-CM

## 2023-03-22 DIAGNOSIS — M150 Primary generalized (osteo)arthritis: Secondary | ICD-10-CM | POA: Diagnosis not present

## 2023-03-22 NOTE — Assessment & Plan Note (Signed)
Reviewed xrays that have been done over the years including MRI. Shows arthritis in multiple joints. She was advised to use arthritis meds - tylenol or motrin

## 2023-03-22 NOTE — Patient Instructions (Signed)
Thyroid Nodule  A thyroid nodule is an isolated growth of thyroid cells that forms a lump in the thyroid gland. The thyroid gland is a butterfly-shaped gland found in the lower front of the neck. It sends chemical messengers (hormones) through the blood to all parts of the body. These hormones are important in regulating body temperature and helping the body use energy. Thyroid nodules are common. Most are not cancerous (are benign). You may have one nodule or several nodules. There are different types of thyroid nodules. They include nodules that: Grow and fill with fluid (thyroid cysts). Produce too much thyroid hormone (hot nodules or hyperthyroid). Produce no thyroid hormone (cold nodules or hypothyroid). Form from cancer cells (thyroid cancers). What are the causes? In most cases, the cause of thyroid nodules is not known. What increases the risk? The following factors may make you more likely to develop thyroid nodules: Age. Thyroid nodules are more common in people who are older than 45 years. Female gender. A family history that includes: Thyroid nodules. Pheochromocytoma. Thyroid carcinoma. Hyperparathyroidism. Certain thyroid diseases, such as Hashimoto's thyroiditis. Lack of iodine in your diet. A history of head and neck radiation, such as from cancer treatments. Type 2 diabetes. What are the signs or symptoms? In many cases, there are no symptoms. If you have symptoms, they may include: A lump in your lower neck. Feeling pressure, fullness, or a tickle in your throat. Pain in your neck, jaw, or ear. Having trouble swallowing or breathing. Hot nodules may cause: Weight loss. Warm, flushed skin. Feeling hot. Feeling nervous. A rapid or irregular heartbeat. Cold nodules may cause: Weight gain. Dry skin. Hair loss, brittle hair, or both. Feeling cold. Fatigue. Thyroid cancer nodules may cause: Hard nodules that can be felt along the thyroid  gland. Hoarseness. Lumps in the tissue (lymph nodes) near your thyroid gland. How is this diagnosed? A thyroid nodule may be felt by your health care provider during a physical exam. This condition may also be diagnosed based on your symptoms. You may also have tests, including: Blood tests to check how well your thyroid is working. An ultrasound. This may be done to confirm the diagnosis. A biopsy. This involves taking a sample from the nodule and looking at it under a microscope. A thyroid scan. This test creates an image of the thyroid gland using a radioactive tracer. Imaging tests such as an MRI or CT scan. These may be done if: A nodule is large. A nodule is blocking your airway. Cancer is suspected. How is this treated? Treatment depends on the cause and size of your nodule or nodules. If a nodule is benign, treatment may not be necessary. Your health care provider may monitor the nodule to see if it goes away without treatment. If a nodule continues to grow, is cancerous, or does not go away, treatment may be needed. Treatment may include: Having a cystic nodule drained with a needle. Ablation therapy. In this treatment, alcohol is injected into the area of the nodule to destroy the cells. Ablation with heat may also be used. This is called thermal ablation. Radioactive iodine. In this treatment, radioactive iodine is given as a pill or liquid that you drink. This substance causes the thyroid nodule to shrink. Surgery to remove the nodule or nodules. Part or all of your thyroid gland may also need to be removed. Medicines to treat hyperthyroidism. Follow these instructions at home: Pay attention to any changes in your thyroid nodule or nodules. Take over-the-counter  and prescription medicines only as told by your health care provider. Keep all follow-up visits. This is important. Contact a health care provider if: You have trouble sleeping. You have muscle weakness. You have  significant weight loss without changing your eating habits. You feel nervous. You have trouble swallowing. You have increased swelling. You have a rapid or irregular heartbeat. Get help right away if: You have chest pain. You faint or lose consciousness. Your nodule makes it hard for you to breathe. These symptoms may be an emergency. Get help right away. Call 911. Do not wait to see if the symptoms will go away. Do not drive yourself to the hospital. Summary A thyroid nodule is an isolated growth of thyroid cells that forms a lump in your thyroid gland. Thyroid nodules are common. Most are not cancerous. Your health care provider may monitor the nodule to see if it goes away without treatment. If a nodule continues to grow, is cancerous, or does not go away, treatment may be needed. Treatment depends on the cause and size of your nodule or nodules. This information is not intended to replace advice given to you by your health care provider. Make sure you discuss any questions you have with your health care provider. Document Revised: 02/24/2021 Document Reviewed: 02/24/2021 Elsevier Patient Education  2024 [employer].

## 2023-03-22 NOTE — Assessment & Plan Note (Signed)
Chronic stable condition Continue rosuvastatin 10 mg daily Diet and nutrition discussed

## 2023-03-22 NOTE — Assessment & Plan Note (Signed)
Clinically stable.  No signs of congestive heart failure.

## 2023-03-22 NOTE — Progress Notes (Signed)
Lisa Miles 72 y.o.   Chief Complaint  Patient presents with   Follow-up    Patient went to the ED on 03/16/23. Patient is feeling better since then, lump is still there.     HISTORY OF PRESENT ILLNESS: This is a 72 y.o. female here for follow-up of emergency department visit on 03/16/2023. CT scan of neck showed thyroid nodule.  Needs evaluation Denies difficulty swallowing.  Denies fever or chills.  Denies chest pain  HPI   Prior to Admission medications   Medication Sig Start Date End Date Taking? Authorizing Provider  amoxicillin (AMOXIL) 500 MG tablet Take 500 mg by mouth. Take 4 tablets before dental procedures.    [provider]  aspirin EC 81 MG tablet Take 81 mg by mouth daily. Swallow whole.    [provider]  calcium carbonate (CALCIUM 600) 600 MG TABS tablet     [provider]  chlorthalidone (HYGROTON) 25 MG tablet TAKE ONE-HALF OF A TABLET BY MOUTH DAILY 02/08/23   Marykay Lex, MD  Cholecalciferol (VITAMIN D) 50 MCG (2000 UT) CAPS Take 2,000 Units by mouth daily.    [provider]  esomeprazole (NEXIUM) 20 MG capsule Take 1 capsule (20 mg total) by mouth 2 (two) times daily before a meal. 12/11/22   Zehr, Shanda Bumps D, PA-C  hydroxypropyl methylcellulose / hypromellose (ISOPTO TEARS / GONIOVISC) 2.5 % ophthalmic solution Place 1 drop into both eyes in the morning and at bedtime.    [provider]  ibuprofen (ADVIL) 600 MG tablet Take 600 mg by mouth every 8 (eight) hours as needed.    [provider]  [name] (KEPPRA) 500 MG tablet Take 1 tablet (500 mg total) by mouth 2 (two) times daily. 01/20/23   Anson Fret, MD  metoprolol succinate (TOPROL-XL) 25 MG 24 hr tablet TAKE 1 TABLET(25 MG) BY MOUTH DAILY 12/29/22   Marykay Lex, MD  rosuvastatin (CRESTOR) 10 MG tablet TAKE 1 TABLET(10 MG) BY MOUTH DAILY 02/27/23   Georgina Quint, MD  traMADol (ULTRAM) 50 MG tablet Take 50 mg by mouth every 6  (six) hours as needed.    [provider]  valsartan (DIOVAN) 320 MG tablet TAKE 1 TABLET(320 MG) BY MOUTH DAILY 12/20/22   Georgina Quint, MD    No Known Allergies  Patient Active Problem List   Diagnosis Date Noted   Thyroid nodule 03/22/2023   Seizure disorder (HCC) 01/06/2023   Memory problem 07/07/2022   Chronic nonintractable headache 07/07/2022   Visual complaint 07/07/2022   PAC (premature atrial contraction) -wiht Atrial Runs 07/19/2021   Mild aortic stenosis 06/06/2021   Valvular heart disease 02/20/2021   OSA (obstructive sleep apnea) 11/02/2019   Non-restorative sleep 08/24/2019   Primary osteoarthritis involving multiple joints 04/01/2018   OAB (overactive bladder) 04/01/2018   Thyromegaly 03/28/2013   Essential hypertension 07/14/2012   Dyslipidemia 05/30/2009   Moderate to severe mitral regurgitation 05/30/2009    Past Medical History:  Diagnosis Date   Aortic stenosis, mild-moderate 07/2012   TTE October 21: Moderate aortic valve thickening with mild to moderate AI & mild AS; TEE: mild to moderate AI & MIld AS   Arthritis    Phreesia 11/06/2019   Asthma    Bilateral bunions    Bunionectomies performed   Bronchitis, chronic (HCC)    Cataract    Phreesia 11/06/2019   Heart disease    Hyperlipidemia    Hypertension    Good control  Inguinodynia    Bilateral groin pain   Lower extremity edema    Chronic. Venous Duplex 11/06/11 SUMMARY: 1) Bilateral Lower Extremities: No evidence of DVT or thrombophlebitis.  2) Right Common Femoral Vein: Demonstrated mild valvular insufficiency with a greater than (1) sec of duration. Mildly abnormal LE Venous duplex Doppler.   Obesity    Palpitations    Relatively well controlled   Scoliosis    DG Chest 2 View x-ray on 07/30/11 by Dr. Cleta Alberts shows a scoliosis.   Severe mitral regurgitation by prior echocardiogram 07/2012   Mild AMVL prolapse, Mod MR -- no MVP noted in 01/2018 -> 11/'21: Severe MR due to  restricted movement of P3 scallop of PMVL (IIIB) due to incomplete leaflet coaptation -> thickened leaflets consistent with Rheumatic Heart Valve Disease.    Past Surgical History:  Procedure Laterality Date   ABDOMINAL HYSTERECTOMY     ANKLE ARTHROSCOPY Left    BUNIONECTOMY Bilateral    CARDIAC EVENT MONITOR  03/2020   (04/05/2020 -05/04/2020): Mostly NSR.  Heart rate range 42-148 bpm.  3 short bursts of 4-5 beats NSVT (asymptomatic).  116 beat run of PAT.  Rare PACs and PVCs.  Some bigeminy and trigeminy.   CARDIOPULMONARY EXERCISE TEST (CPX)  05/09/2020   (done with Ex St Echo): Normal Functional Capacity. No indication for CP limitations.  Body Habitus contributes to Exercise Intolerance.   EXERCISE STRESS ECHO  05/09/2020   TTE shows normal EF 65% with LVH No pericardial effusion Normal RV size and function Moderate appearing rheumatic MR/AR.;; -- NO COMMENT ON EXERCISE EFFECT ON MR!!! (even though this was the reason for ordering the test)   EYE SURGERY N/A    Phreesia 11/06/2019   KNEE ARTHROSCOPY Right    Left knee open surgery     pt thinks she only had shots in L knee, not surgery   RIGHT/LEFT HEART CATH AND CORONARY ANGIOGRAPHY N/A 03/28/2020   RIGHT/LEFT HEART CATH AND CORONARY ANGIOGRAPHY;  Marykay Lex, MD;  Location: [facility] INVASIVE CV LAB; angiographically normal coronary arteries.  Cloacal LM. Normal RHC Pressures (PAP 40/13 - mean 22 mmHg, PCWP , V wave 25 mmHg c/w MR, LVEDP 15 mmHg); CO/CI 5.02 / 2.45 (reduced).   TEE WITHOUT CARDIOVERSION N/A 03/13/2020   Procedure: TRANSESOPHAGEAL ECHOCARDIOGRAM (TEE);  Surgeon: Sande Rives, MD;  [facility] ENDOSCOPY;;; Severe MR 2/2 restricted P3 scallop of PMVL (IIIB) w/ incomplete leaflet coaptation.  Thickened /degenerative leaflets c/w Rheumatic Valvular Heart Disease.  2D PISA radius 0.9 cm with 2D ERO 0.26 cm2 with R Vol 62 cc. PV blunting w/ BP 109/60 mmHg. No MS. Mild-mod AI / Mild AS. Gr 2 plaque Aorta   TOTAL ABDOMINAL  HYSTERECTOMY     TRANSTHORACIC ECHOCARDIOGRAM  05/09/2021   EF 60 to 65%.  No RWMA.  GR 1 DD with elevated LAP-moderately dilated left atrium (LA).  Normal RV size and function.  Moderate to severe MR with no MS.  Moderate aortic calcification/sclerosis with only mild stenosis.  Ascending aorta 39 mm.   TRANSTHORACIC ECHOCARDIOGRAM  01/2018   a) Normal LV size.  Mod Conc LVH.  EF 60-65%.  No R WMA.  GR 1 DD (high P).  Mild AS (MG 16 mmHg) w/ Mod AI.  Severe LA dilation.  Mild MR.; 01/2020:  EF 65-70%. No RWMA.  Severe MR w/ restricted PMVL movement.  (Recommend TEE).  Mod AoV thickening w/ mild to mod AS & Mod AI.  Nl RV fxn.  Mildly elevated  filling P. Severe LA dilation.   Zio Patch Monitor  05/2021   Predominant Rhythm Is Sinus Rhythm - HR range 45 -139 bpm & avg 74 bpm.  Occ PACs (2.9%) w/ some couplets and triplets,& rare PVCs. 20 Atrial Runs: Fastest - 6 beats w/ rate 235 bpm; Longest 12 beats @ 138 bpm (~ 5.2 sec); Symptoms were noted with PACs and PVCs not with atrial tachycardia runs.    Social History   Socioeconomic History   Marital status: Married    Spouse name: Molly Maduro   Number of children: 2   Years of education: Not on file   Highest education level: Not on file  Occupational History   Occupation: production    Employer: Publishing rights manager    Comment: check printing/shipping  Tobacco Use   Smoking status: Never   Smokeless tobacco: Never  Vaping Use   Vaping status: Never Used  Substance and Sexual Activity   Alcohol use: No   Drug use: No   Sexual activity: Never  Other Topics Concern   Not on file  Social History Narrative   Lives with her husband.  Their children live nearby.   Right handed   Caffeine: 1 cup/day   Social Determinants of Health   Financial Resource Strain: Low Risk  (09/02/2022)   Overall Financial Resource Strain (CARDIA)    Difficulty of Paying Living Expenses: Not hard at all  Food Insecurity: No Food Insecurity (09/02/2022)   Hunger Vital Sign     Worried About Running Out of Food in the Last Year: Never true    Ran Out of Food in the Last Year: Never true  Transportation Needs: No Transportation Needs (09/02/2022)   PRAPARE - Administrator, Civil Service (Medical): No    Lack of Transportation (Non-Medical): No  Physical Activity: Insufficiently Active (09/02/2022)   Exercise Vital Sign    Days of Exercise per Week: 3 days    Minutes of Exercise per Session: 20 min  Stress: Stress Concern Present (09/02/2022)   [employer] of Occupational Health - Occupational Stress Questionnaire    Feeling of Stress : To some extent  Social Connections: Moderately Isolated (09/02/2022)   Social Connection and Isolation Panel [NHANES]    Frequency of Communication with Friends and Family: Three times a week    Frequency of Social Gatherings with Friends and Family: Three times a week    Attends Religious Services: Never    Active Member of Clubs or Organizations: No    Attends Banker Meetings: Never    Marital Status: Married  Catering manager Violence: Not At Risk (09/02/2022)   Humiliation, Afraid, Rape, and Kick questionnaire    Fear of Current or Ex-Partner: No    Emotionally Abused: No    Physically Abused: No    Sexually Abused: No    Family History  Problem Relation Age of Onset   Arthritis Mother    Hyperlipidemia Mother    Diabetes Mother    Gout Mother    Hypertension Mother    Dementia Mother    Cancer Father        GI cancer   Hypertension Brother    Diabetes Brother    Hypertension Brother    Diabetes Maternal Grandfather    Migraines Neg Hx    Headache Neg Hx      Review of Systems  Constitutional: Negative.  Negative for chills and fever.  HENT: Negative.  Negative for congestion and sore throat.  Respiratory: Negative.  Negative for cough and shortness of breath.   Cardiovascular: Negative.  Negative for chest pain and palpitations.  Gastrointestinal:  Negative for abdominal  pain, diarrhea, nausea and vomiting.  Genitourinary: Negative.  Negative for dysuria and hematuria.  Skin: Negative.  Negative for rash.  Neurological: Negative.  Negative for dizziness and headaches.  All other systems reviewed and are negative.   Today's Vitals   03/22/23 1324  BP: 120/68  Pulse: (!) 57  Temp: 98.1 F (36.7 C)  TempSrc: Oral  SpO2: 95%  Weight: 192 lb (87.1 kg)  Height: 5\' 5"  (1.651 m)   Body mass index is 31.95 kg/m.   Physical Exam Vitals reviewed.  Constitutional:      Appearance: Normal appearance.  HENT:     Head: Normocephalic.     Mouth/Throat:     Mouth: Mucous membranes are moist.     Pharynx: Oropharynx is clear.  Eyes:     Extraocular Movements: Extraocular movements intact.  Neck:     Comments: Left-sided hard nontender thyroid mass Cardiovascular:     Rate and Rhythm: Normal rate.     Heart sounds: Murmur heard.  Pulmonary:     Effort: Pulmonary effort is normal.     Breath sounds: Normal breath sounds.  Musculoskeletal:     Cervical back: No tenderness.  Lymphadenopathy:     Cervical: No cervical adenopathy.  Skin:    General: Skin is warm and dry.  Neurological:     Mental Status: She is alert and oriented to B[place], place, and time.  Psychiatric:        Mood and Affect: Mood normal.        Behavior: Behavior normal.     CT Soft Tissue Neck W Contrast  Result Date: 03/16/2023 CLINICAL DATA:  Soft tissue infection suspected, knot in left throat for 2 days, painful to swallow EXAM: CT NECK WITH CONTRAST TECHNIQUE: Multidetector CT imaging of the neck was performed using the standard protocol following the bolus administration of intravenous contrast. RADIATION DOSE REDUCTION: This exam was performed according to the departmental dose-optimization program which includes automated exposure control, adjustment of the mA and/or kV according to patient size and/or use of iterative reconstruction technique. CONTRAST:  75mL OMNIPAQUE  IOHEXOL 350 MG/ML SOLN COMPARISON:  None Available. FINDINGS: Pharynx and larynx: Normal. No mass or swelling. Salivary glands: No inflammation, mass, or stone. Thyroid: 3.5 cm hypodense mass in the left thyroid lobe (series 3, image 89), which causes rightward tracheal deviation and likely mass effect on esophagus. Additional smaller nodules are also seen in the thyroid. Lymph nodes: None enlarged or abnormal density. Vascular: Patent major arteries and veins the neck. Limited intracranial: Negative. Visualized orbits: No acute finding. Status post bilateral lens replacements. Mastoids and visualized paranasal sinuses: Clear. Skeleton: No acute osseous abnormality. Upper chest: 4 mm solid nodule in the left upper lobe (series 3, image 140). 5 mm solid nodule in the inferior right upper lobe (series 3, image 118). No pleural effusion. Other: None. IMPRESSION: 1. 3.5 cm hypodense mass in the left thyroid lobe, which causes rightward tracheal deviation and likely mass effect on the esophagus. Correlate with the location of the patient's perceived "knot." The thyroid was last evaluated with ultrasound on 05/28/2014. Recommend non emergent thyroid ultrasound for further evaluation. 2. Bilateral upper lobe pulmonary nodules measuring up to 5 mm. No follow-up needed if patient is low-risk (and has no known or suspected primary neoplasm). Non-contrast chest CT can be considered  in 12 months if patient is high-risk. This recommendation follows the consensus statement: Guidelines for Management of Incidental Pulmonary Nodules Detected on CT Images: From the Fleischner Society 2017; Radiology 2017; 284:228-243. 3. No additional acute finding in the neck. Electronically Signed   By: Wiliam Ke M.D.   On: 03/16/2023 23:11    ASSESSMENT & PLAN:  A total of 44 minutes was spent with the patient and counseling/coordination of care regarding preparing for this visit, review of most recent office visit notes, review of most  recent emergency department visit notes, review of recent neck imaging report, finding of thyroid nodule and need for workup including biopsy and ENT/endocrinology evaluation, review of multiple chronic medical conditions and their management, review of all medications, review of most recent bloodwork results, review of health maintenance items, education on nutrition, prognosis, documentation, and need for follow up. .  Problem List Items Addressed This Visit       Cardiovascular and Mediastinum   Essential hypertension (Chronic)    Well-controlled hypertension Continue valsartan 320 mg daily, metoprolol succinate 25 mg daily, chlorthalidone 25 mg daily Cardiovascular risks associated with hypertension discussed      Valvular heart disease (Chronic)    Clinically stable.  No signs of congestive heart failure.        Respiratory   OSA (obstructive sleep apnea) (Chronic)    Not on CPAP.        Endocrine   Thyroid nodule - Primary    Recent onset and visualized on recent CT scan of neck Recommend thyroid ultrasound Will need biopsy.  Needs referral to ENT and or endocrinology Referrals placed today.      Relevant Orders   US THYROID   Ambulatory referral to ENT   Ambulatory referral to Endocrinology     Nervous and Auditory   Seizure disorder [facility])    Recently started on Keppra 500 mg twice a day by neurology service        Musculoskeletal and Integument   Primary osteoarthritis involving multiple joints    Reviewed xrays that have been done over the years including MRI. Shows arthritis in multiple joints. She was advised to use arthritis meds - tylenol or motrin          Other   Dyslipidemia (Chronic)    Chronic stable condition Continue rosuvastatin 10 mg daily Diet and nutrition discussed      Patient Instructions  Thyroid Nodule  A thyroid nodule is an isolated growth of thyroid cells that forms a lump in the thyroid gland. The thyroid gland is a  butterfly-shaped gland found in the lower front of the neck. It sends chemical messengers (hormones) through the blood to all parts of the body. These hormones are important in regulating body temperature and helping the body use energy. Thyroid nodules are common. Most are not cancerous (are benign). You may have one nodule or several nodules. There are different types of thyroid nodules. They include nodules that: Grow and fill with fluid (thyroid cysts). Produce too much thyroid hormone (hot nodules or hyperthyroid). Produce no thyroid hormone (cold nodules or hypothyroid). Form from cancer cells (thyroid cancers). What are the causes? In most cases, the cause of thyroid nodules is not known. What increases the risk? The following factors may make you more likely to develop thyroid nodules: Age. Thyroid nodules are more common in people who are older than 45 years. Female gender. A family history that includes: Thyroid nodules. Pheochromocytoma. Thyroid carcinoma. Hyperparathyroidism. Certain thyroid  diseases, such as Hashimoto's thyroiditis. Lack of iodine in your diet. A history of head and neck radiation, such as from cancer treatments. Type 2 diabetes. What are the signs or symptoms? In many cases, there are no symptoms. If you have symptoms, they may include: A lump in your lower neck. Feeling pressure, fullness, or a tickle in your throat. Pain in your neck, jaw, or ear. Having trouble swallowing or breathing. Hot nodules may cause: Weight loss. Warm, flushed skin. Feeling hot. Feeling nervous. A rapid or irregular heartbeat. Cold nodules may cause: Weight gain. Dry skin. Hair loss, brittle hair, or both. Feeling cold. Fatigue. Thyroid cancer nodules may cause: Hard nodules that can be felt along the thyroid gland. Hoarseness. Lumps in the tissue (lymph nodes) near your thyroid gland. How is this diagnosed? A thyroid nodule may be felt by your health care  provider during a physical exam. This condition may also be diagnosed based on your symptoms. You may also have tests, including: Blood tests to check how well your thyroid is working. An ultrasound. This may be done to confirm the diagnosis. A biopsy. This involves taking a sample from the nodule and looking at it under a microscope. A thyroid scan. This test creates an image of the thyroid gland using a radioactive tracer. Imaging tests such as an MRI or CT scan. These may be done if: A nodule is large. A nodule is blocking your airway. Cancer is suspected. How is this treated? Treatment depends on the cause and size of your nodule or nodules. If a nodule is benign, treatment may not be necessary. Your health care provider may monitor the nodule to see if it goes away without treatment. If a nodule continues to grow, is cancerous, or does not go away, treatment may be needed. Treatment may include: Having a cystic nodule drained with a needle. Ablation therapy. In this treatment, alcohol is injected into the area of the nodule to destroy the cells. Ablation with heat may also be used. This is called thermal ablation. Radioactive iodine. In this treatment, radioactive iodine is given as a pill or liquid that you drink. This substance causes the thyroid nodule to shrink. Surgery to remove the nodule or nodules. Part or all of your thyroid gland may also need to be removed. Medicines to treat hyperthyroidism. Follow these instructions at home: Pay attention to any changes in your thyroid nodule or nodules. Take over-the-counter and prescription medicines only as told by your health care provider. Keep all follow-up visits. This is important. Contact a health care provider if: You have trouble sleeping. You have muscle weakness. You have significant weight loss without changing your eating habits. You feel nervous. You have trouble swallowing. You have increased swelling. You have a rapid or  irregular heartbeat. Get help right away if: You have chest pain. You faint or lose consciousness. Your nodule makes it hard for you to breathe. These symptoms may be an emergency. Get help right away. Call 911. Do not wait to see if the symptoms will go away. Do not drive yourself to the hospital. Summary A thyroid nodule is an isolated growth of thyroid cells that forms a lump in your thyroid gland. Thyroid nodules are common. Most are not cancerous. Your health care provider may monitor the nodule to see if it goes away without treatment. If a nodule continues to grow, is cancerous, or does not go away, treatment may be needed. Treatment depends on the cause and size of your  nodule or nodules. This information is not intended to replace advice given to you by your health care provider. Make sure you discuss any questions you have with your health care provider. Document Revised: 02/24/2021 Document Reviewed: 02/24/2021 Elsevier Patient Education  2024 Elsevier Inc.     Edwina Barth, MD [facility]

## 2023-03-22 NOTE — Assessment & Plan Note (Signed)
Recently started on Keppra 500 mg twice a day by neurology service

## 2023-03-22 NOTE — Assessment & Plan Note (Signed)
Well-controlled hypertension Continue valsartan 320 mg daily, metoprolol succinate 25 mg daily, chlorthalidone 25 mg daily Cardiovascular risks associated with hypertension discussed

## 2023-03-22 NOTE — Assessment & Plan Note (Signed)
Recent onset and visualized on recent CT scan of neck Recommend thyroid ultrasound Will need biopsy.  Needs referral to ENT and or endocrinology Referrals placed today.

## 2023-03-24 ENCOUNTER — Other Ambulatory Visit: Payer: PPO

## 2023-04-05 ENCOUNTER — Encounter (INDEPENDENT_AMBULATORY_CARE_PROVIDER_SITE_OTHER): Payer: Self-pay | Admitting: Otolaryngology

## 2023-04-06 ENCOUNTER — Ambulatory Visit
Admission: RE | Admit: 2023-04-06 | Discharge: 2023-04-06 | Disposition: A | Discharge status: Home / Self Care | Payer: PPO | Source: Ambulatory Visit | Attending: Emergency Medicine | Admitting: Emergency Medicine

## 2023-04-06 DIAGNOSIS — E042 Nontoxic multinodular goiter: Secondary | ICD-10-CM | POA: Diagnosis not present

## 2023-04-06 DIAGNOSIS — E041 Nontoxic single thyroid nodule: Secondary | ICD-10-CM

## 2023-04-08 ENCOUNTER — Other Ambulatory Visit: Payer: Self-pay | Admitting: Gastroenterology

## 2023-04-19 ENCOUNTER — Telehealth: Payer: Self-pay | Admitting: Emergency Medicine

## 2023-04-19 NOTE — Telephone Encounter (Unsigned)
Copied from CRM [id number]. Topic: Clinical - Lab/Test Results >> Apr 19, 2023 12:51 PM Leavy Cella D wrote: Reason for CRM: Patient would like results of ultrasound

## 2023-04-20 NOTE — Telephone Encounter (Signed)
Spoke with patient about results, informed patient she should give them a call to get an appointment scheduled. Number was given

## 2023-04-29 DIAGNOSIS — E041 Nontoxic single thyroid nodule: Secondary | ICD-10-CM | POA: Diagnosis not present

## 2023-05-03 ENCOUNTER — Other Ambulatory Visit: Payer: Self-pay

## 2023-05-03 DIAGNOSIS — E042 Nontoxic multinodular goiter: Secondary | ICD-10-CM | POA: Diagnosis not present

## 2023-05-03 MED ORDER — CHLORTHALIDONE 25 MG PO TABS: ORAL_TABLET | ORAL | 0 refills | Status: DC

## 2023-05-04 ENCOUNTER — Encounter: Payer: Self-pay | Admitting: Neurology

## 2023-05-04 ENCOUNTER — Other Ambulatory Visit: Payer: Self-pay | Admitting: Physician Assistant

## 2023-05-04 ENCOUNTER — Ambulatory Visit: Payer: PPO | Admitting: Neurology

## 2023-05-04 VITALS — BP 118/76 | HR 70 | Ht 65.0 in | Wt 192.0 lb

## 2023-05-04 DIAGNOSIS — G40209 Localization-related (focal) (partial) symptomatic epilepsy and epileptic syndromes with complex partial seizures, not intractable, without status epilepticus: Secondary | ICD-10-CM | POA: Diagnosis not present

## 2023-05-04 DIAGNOSIS — E042 Nontoxic multinodular goiter: Secondary | ICD-10-CM

## 2023-05-04 MED ORDER — LEVETIRACETAM 500 MG PO TABS: 500.0000 mg | ORAL_TABLET | Freq: Two times a day (BID) | ORAL | 4 refills | Status: DC

## 2023-05-04 NOTE — Patient Instructions (Addendum)
 Complex partial seizures are defined by an episode of impaired consciousness with altered behavior, sensation, or motor activity that can last from 30 seconds to >2 minutes. The motor activity may consist of repetitive automatic movements of the face or limbs. Partial seizures with secondary generalization occur when the seizure initially localized to one limited area spreads to involve both sides of the brain and evolves into a tonic-clonic or "grand mal" seizure.  Meds ordered this encounter  Medications   [name]  (KEPPRA ) 500 MG tablet    Sig: Take 1 tablet (500 mg total) by mouth 2 (two) times daily.    Dispense:  180 tablet    Refill:  4    Epilepsy: Definition A condition where a Elem has two or more unprovoked seizures that occur more than 24 hours apart  If April 1st there is no further seizure activity can drive  Continue Keppra  twice daily  Per [place]  [place] statutes, patients with seizures are not allowed to drive until they have been seizure-free for six months.    Use caution when using heavy equipment or power tools. Avoid working on ladders or at heights. Take showers instead of baths. Ensure the water temperature is not too high on the home water heater. Do not go swimming alone. Do not lock yourself in a room alone (i.e. bathroom). When caring for infants or small children, sit down when holding, feeding, or changing them to minimize risk of injury to the child in the event you have a seizure. Maintain good sleep hygiene. Avoid alcohol .    If patient has another seizure, call 911 and bring them back to the ED if: A.  The seizure lasts longer than 5 minutes.      B.  The patient doesn't wake shortly after the seizure or has new problems such as difficulty seeing, speaking or moving following the seizure C.  The patient was injured during the seizure D.  The patient has a temperature over 102 F (39C) E.  The patient vomited during the seizure and now is having  trouble breathing  Per [place] statutes, patients with seizures are not allowed to drive until they have been seizure-free for six months.  Other recommendations include using caution when using heavy equipment or power tools. Avoid working on ladders or at heights. Take showers instead of baths.  Do not swim alone.  Ensure the water temperature is not too high on the home water heater. Do not go swimming alone. Do not lock yourself in a room alone (i.e. bathroom). When caring for infants or small children, sit down when holding, feeding, or changing them to minimize risk of injury to the child in the event you have a seizure. Maintain good sleep hygiene. Avoid alcohol .  Also recommend adequate sleep, hydration, good diet and minimize stress.  During the Seizure  - First, ensure adequate ventilation and place patients on the floor on their left side  Loosen clothing around the neck and ensure the airway is patent. If the patient is clenching the teeth, do not force the mouth open with any object as this can cause severe damage - Remove all items from the surrounding that can be hazardous. The patient may be oblivious to what's happening and may not even know what he or she is doing. If the patient is confused and wandering, either gently guide him/her away and block access to outside areas - Reassure the individual and be comforting - Call 911. In most cases,  the seizure ends before EMS arrives. However, there are cases when seizures may last over 3 to 5 minutes. Or the individual may have developed breathing difficulties or severe injuries. If a pregnant patient or a Bergstresser with diabetes develops a seizure, it is prudent to call an ambulance. - Finally, if the patient does not regain full consciousness, then call EMS. Most patients will remain confused for about 45 to 90 minutes after a seizure, so you must use judgment in calling for help. - Avoid restraints but make sure the patient is in  a bed with padded side rails - Place the individual in a lateral position with the neck slightly flexed; this will help the saliva drain from the mouth and prevent the tongue from falling backward - Remove all nearby furniture and other hazards from the area - Provide verbal assurance as the individual is regaining consciousness - Provide the patient with privacy if possible - Call for help and start treatment as ordered by the caregiver   fter the Seizure (Postictal Stage)  After a seizure, most patients experience confusion, fatigue, muscle pain and/or a headache. Thus, one should permit the individual to sleep. For the next few days, reassurance is essential. Being calm and helping reorient the Liese is also of importance.  Most seizures are painless and end spontaneously. Seizures are not harmful to others but can lead to complications such as stress on the lungs, brain and the heart. Individuals with prior lung problems may develop labored breathing and respiratory distress.

## 2023-05-04 NOTE — Progress Notes (Signed)
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 caution when using heavy equipment or power tools. Avoid working on ladders or at heights. Take showers instead of baths. Ensure the water temperature is not too high on the home water heater. Do not go swimming alone. Do not lock yourself in a room alone (i.e. bathroom). When caring for infants or small children, sit down when holding, feeding, or changing them to minimize risk of injury to the child in the event you have a seizure. Maintain good sleep hygiene. Avoid alcohol .      No orders of the defined types were placed in this encounter.    Cc: Lisa Miles, *,    Lisa Epp, MD  [facility] [address] [place], [place] [address]  Phone [phone] Fax [phone]  I spent over 30 minutes of face-to-face and non-face-to-face time with patient on the  1. Partial symptomatic epilepsy with complex partial seizures, not intractable, without status epilepticus (HCC)       diagnosis.  This  included previsit chart review, lab review, study review, order entry, electronic health record documentation, patient education on the different diagnostic and therapeutic options, counseling and coordination of care, risks and benefits of management, compliance, or risk factor reduction

## 2023-05-14 ENCOUNTER — Ambulatory Visit (HOSPITAL_COMMUNITY)
Admission: RE | Admit: 2023-05-14 | Discharge: 2023-05-14 | Disposition: A | Discharge status: Home / Self Care | Payer: PPO | Source: Ambulatory Visit | Attending: Cardiology | Admitting: Cardiology

## 2023-05-14 DIAGNOSIS — I340 Nonrheumatic mitral (valve) insufficiency: Secondary | ICD-10-CM | POA: Diagnosis not present

## 2023-05-14 DIAGNOSIS — I350 Nonrheumatic aortic (valve) stenosis: Secondary | ICD-10-CM | POA: Diagnosis not present

## 2023-05-14 LAB — ECHOCARDIOGRAM COMPLETE
AR max vel: 0.84 cm2
AV Area VTI: 0.76 cm2
AV Area mean vel: 0.77 cm2
AV Mean grad: 12 mmHg
AV Peak grad: 22.8 mmHg
AV Vena cont: 0.26 cm
Ao pk vel: 2.39 m/s
Area-P 1/2: 3.19 cm2
MV M vel: 3.15 m/s
MV Peak grad: 39.7 mmHg
P 1/2 time: 1089 ms
S' Lateral: 3.33 cm

## 2023-05-17 ENCOUNTER — Encounter: Payer: Self-pay | Admitting: Cardiology

## 2023-05-19 ENCOUNTER — Telehealth: Payer: Self-pay | Admitting: *Deleted

## 2023-05-19 NOTE — Telephone Encounter (Signed)
Called left detailed message of results on patient 's secure voice mail per DPR. Any question may call back.  Instructed patient contact office for annual appt for [facility] 2025 or April 2025

## 2023-05-19 NOTE — Telephone Encounter (Signed)
-----   Message from Bryan Lemma sent at 05/17/2023  7:23 PM EST ----- Echocardiogram results: Mitral valve regurgitation read as mild. Aortic valve appears to be functionally " bicuspid" with fusion of 2 of the cusps.  Thankfully, the aortic stenosis and aortic regurgitation is estimated to be moderate..  The aortic valve mean gradient appears to be lower than previous study.  Otherwise left ventricle is hyperdynamic with ejection fraction of 70 to 75% with abnormal grade 2 diastolic dysfunction and severe left atrial enlargement indicating left atrial pressures are elevated.  Overall pretty much unchanged findings, besides aortic valve gradients being slightly lower.  Bryan Lemma, MD

## 2023-06-11 NOTE — Progress Notes (Signed)
Chief Complaint: Patient was seen in consultation today for symptomatic thyroid nodule  Referring Physician(s): Spainhour,David S  History of Present Illness: Lisa Miles is a 73 y.o. female with a medical history significant for HTN and GERD. She was referred to Otolaryngology April 2023 for evaluation of constant throat clearing and nasal congestion. A prior endoscopy showed laryngeal erythema consistent with reflux.  Her consultation with Otolaryngology was unrevealing and she was started on fluticasone and dietary changes. This helped somewhat but her symptoms never fully resolved.   She presented to the ED November 2024 with complaints of a sore throat and a bump in her left neck. CT imaging showed a 3.5 cm hypodense mass in the left thyroid lobe causing rightward tracheal deviation and mass effect on the esophagus. A thyroid ultrasound was recommended and this was performed 04/06/23. Multiple bilateral nodules were identified including a 3.2 predominantly cystic left inferior thyroid nodule.  The patient followed up with her otolaryngologist  Dr. Aleene Davidson 05/03/23 and he has kindly referred this patient to Interventional Radiology to discuss aspiration of this cyst with possible ethanol ablation.   Thyroid Cosmetic Score:  Readily detected cosmetic problem (Grade 4).  Past Medical History:  Diagnosis Date   Aortic stenosis, mild-moderate 07/2012   TTE October 21: Moderate aortic valve thickening with mild to moderate AI & mild AS; TEE: mild to moderate AI & MIld AS   Arthritis    Phreesia 11/06/2019   Asthma    Bilateral bunions    Bunionectomies performed   Bronchitis, chronic (HCC)    Cataract    Phreesia 11/06/2019   Heart disease    Hyperlipidemia    Hypertension    Good control   Inguinodynia    Bilateral groin pain   Lower extremity edema    Chronic. Venous Duplex 11/06/11 SUMMARY: 1) Bilateral Lower Extremities: No evidence of DVT or thrombophlebitis.  2) Right  Common Femoral Vein: Demonstrated mild valvular insufficiency with a greater than (1) sec of duration. Mildly abnormal LE Venous duplex Doppler.   Obesity    Palpitations    Relatively well controlled   Scoliosis    DG Chest 2 View x-ray on 07/30/11 by Dr. Cleta Alberts shows a scoliosis.   Severe mitral regurgitation by prior echocardiogram 07/2012   Mild AMVL prolapse, Mod MR -- no MVP noted in 01/2018 -> 11/'21: Severe MR due to restricted movement of P3 scallop of PMVL (IIIB) due to incomplete leaflet coaptation -> thickened leaflets consistent with Rheumatic Heart Valve Disease.    Past Surgical History:  Procedure Laterality Date   ABDOMINAL HYSTERECTOMY     ANKLE ARTHROSCOPY Left    BUNIONECTOMY Bilateral    CARDIAC EVENT MONITOR  03/2020   (04/05/2020 -05/04/2020): Mostly NSR.  Heart rate range 42-148 bpm.  3 short bursts of 4-5 beats NSVT (asymptomatic).  116 beat run of PAT.  Rare PACs and PVCs.  Some bigeminy and trigeminy.   CARDIOPULMONARY EXERCISE TEST (CPX)  05/09/2020   (done with Ex St Echo): Normal Functional Capacity. No indication for CP limitations.  Body Habitus contributes to Exercise Intolerance.   EXERCISE STRESS ECHO  05/09/2020   TTE shows normal EF 65% with LVH No pericardial effusion Normal RV size and function Moderate appearing rheumatic MR/AR.;; -- NO COMMENT ON EXERCISE EFFECT ON MR!!! (even though this was the reason for ordering the test)   EYE SURGERY N/A    Phreesia 11/06/2019   KNEE ARTHROSCOPY Right    Left knee open  surgery     pt thinks she only had shots in L knee, not surgery   RIGHT/LEFT HEART CATH AND CORONARY ANGIOGRAPHY N/A 03/28/2020   RIGHT/LEFT HEART CATH AND CORONARY ANGIOGRAPHY;  Marykay Lex, MD;  Location: [facility] INVASIVE CV LAB; angiographically normal coronary arteries.  Cloacal LM. Normal RHC Pressures (PAP 40/13 - mean 22 mmHg, PCWP , V wave 25 mmHg c/w MR, LVEDP 15 mmHg); CO/CI 5.02 / 2.45 (reduced).   TEE WITHOUT CARDIOVERSION N/A  03/13/2020   Procedure: TRANSESOPHAGEAL ECHOCARDIOGRAM (TEE);  Surgeon: Sande Rives, MD;  [facility] ENDOSCOPY;;; Severe MR 2/2 restricted P3 scallop of PMVL (IIIB) w/ incomplete leaflet coaptation.  Thickened /degenerative leaflets c/w Rheumatic Valvular Heart Disease.  2D PISA radius 0.9 cm with 2D ERO 0.26 cm2 with R Vol 62 cc. PV blunting w/ BP 109/60 mmHg. No MS. Mild-mod AI / Mild AS. Gr 2 plaque Aorta   TOTAL ABDOMINAL HYSTERECTOMY     TRANSTHORACIC ECHOCARDIOGRAM  05/09/2021   EF 60 to 65%.  No RWMA.  GR 1 DD with elevated LAP-moderately dilated left atrium (LA).  Normal RV size and function.  Moderate to severe MR with no MS.  Moderate aortic calcification/sclerosis with only mild stenosis.  Ascending aorta 39 mm.   TRANSTHORACIC ECHOCARDIOGRAM  01/2018   a) Normal LV size.  Mod Conc LVH.  EF 60-65%.  No R WMA.  GR 1 DD (high P).  Mild AS (MG 16 mmHg) w/ Mod AI.  Severe LA dilation.  Mild MR.; 01/2020:  EF 65-70%. No RWMA.  Severe MR w/ restricted PMVL movement.  (Recommend TEE).  Mod AoV thickening w/ mild to mod AS & Mod AI.  Nl RV fxn.  Mildly elevated filling P. Severe LA dilation.   Zio Patch Monitor  05/2021   Predominant Rhythm Is Sinus Rhythm - HR range 45 -139 bpm & avg 74 bpm.  Occ PACs (2.9%) w/ some couplets and triplets,& rare PVCs. 20 Atrial Runs: Fastest - 6 beats w/ rate 235 bpm; Longest 12 beats @ 138 bpm (~ 5.2 sec); Symptoms were noted with PACs and PVCs not with atrial tachycardia runs.    Allergies: Patient has no known allergies.  Medications: Prior to Admission medications   Medication Sig Start Date End Date Taking? Authorizing Provider  amoxicillin (AMOXIL) 500 MG tablet Take 500 mg by mouth. Take 4 tablets before dental procedures. Patient not taking: Reported on 05/04/2023    [provider]  aspirin EC 81 MG tablet Take 81 mg by mouth daily. Swallow whole.    [provider]  calcium carbonate (CALCIUM 600) 600 MG TABS tablet     [provider]  chlorthalidone (HYGROTON) 25 MG tablet TAKE ONE-HALF OF A TABLET BY MOUTH DAILY 05/03/23   Marykay Lex, MD  Cholecalciferol (VITAMIN D) 50 MCG (2000 UT) CAPS Take 2,000 Units by mouth daily.    [provider]  esomeprazole (NEXIUM) 20 MG capsule TAKE 1 CAPSULE(20 MG) BY MOUTH TWICE DAILY BEFORE A MEAL 04/09/23   Zehr, Shanda Bumps D, PA-C  fluticasone (FLONASE) 50 MCG/ACT nasal spray Place into the nose.    [provider]  hydroxypropyl methylcellulose / hypromellose (ISOPTO TEARS / GONIOVISC) 2.5 % ophthalmic solution Place 1 drop into both eyes in the morning and at bedtime.    [provider]  ibuprofen (ADVIL) 600 MG tablet Take 600 mg by mouth every 8 (eight) hours as needed.    [provider]  [name] (KEPPRA) 500  MG tablet Take 1 tablet (500 mg total) by mouth 2 (two) times daily. 05/04/23   Anson Fret, MD  metoprolol succinate (TOPROL-XL) 25 MG 24 hr tablet TAKE 1 TABLET(25 MG) BY MOUTH DAILY 12/29/22   Marykay Lex, MD  rosuvastatin (CRESTOR) 10 MG tablet TAKE 1 TABLET(10 MG) BY MOUTH DAILY 02/27/23   Georgina Quint, MD  traMADol (ULTRAM) 50 MG tablet Take 50 mg by mouth every 6 (six) hours as needed.    [provider]  valsartan (DIOVAN) 320 MG tablet TAKE 1 TABLET(320 MG) BY MOUTH DAILY 12/20/22   Georgina Quint, MD     Family History  Problem Relation Age of Onset   Arthritis Mother    Hyperlipidemia Mother    Diabetes Mother    Gout Mother    Hypertension Mother    Dementia Mother    Cancer Father        GI cancer   Hypertension Brother    Diabetes Brother    Hypertension Brother    Diabetes Maternal Grandfather    Migraines Neg Hx    Headache Neg Hx     Social History   Socioeconomic History   Marital status: Married    Spouse name: Molly Maduro   Number of children: 2   Years of education: Not on file   Highest education level: Not on file  Occupational History   Occupation:  production    Employer: Publishing rights manager    Comment: check printing/shipping  Tobacco Use   Smoking status: Never   Smokeless tobacco: Never  Vaping Use   Vaping status: Never Used  Substance and Sexual Activity   Alcohol use: No   Drug use: No   Sexual activity: Never  Other Topics Concern   Not on file  Social History Narrative   Lives with her husband.  Their children live nearby.   Right handed   Caffeine: 1 cup/day   Social Drivers of Corporate investment banker Strain: Low Risk  (09/02/2022)   Overall Financial Resource Strain (CARDIA)    Difficulty of Paying Living Expenses: Not hard at all  Food Insecurity: No Food Insecurity (09/02/2022)   Hunger Vital Sign    Worried About Running Out of Food in the Last Year: Never true    Ran Out of Food in the Last Year: Never true  Transportation Needs: No Transportation Needs (09/02/2022)   PRAPARE - Administrator, Civil Service (Medical): No    Lack of Transportation (Non-Medical): No  Physical Activity: Insufficiently Active (09/02/2022)   Exercise Vital Sign    Days of Exercise per Week: 3 days    Minutes of Exercise per Session: 20 min  Stress: Stress Concern Present (09/02/2022)   [employer] of Occupational Health - Occupational Stress Questionnaire    Feeling of Stress : To some extent  Social Connections: Moderately Isolated (09/02/2022)   Social Connection and Isolation Panel [NHANES]    Frequency of Communication with Friends and Family: Three times a week    Frequency of Social Gatherings with Friends and Family: Three times a week    Attends Religious Services: Never    Active Member of Clubs or Organizations: No    Attends Banker Meetings: Never    Marital Status: Married     Review of Systems: A 12 point ROS discussed and pertinent positives are indicated in the HPI above.  All other systems are negative.  Vital Signs: There were no  vitals taken for this visit.  Physical  Exam Constitutional:      General: She is not in acute distress. HENT:     Head: Normocephalic.     Nose: Nose normal.     Mouth/Throat:     Mouth: Mucous membranes are moist.  Eyes:     General: No scleral icterus. Neck:      Comments: Prominent bulge about left mid thyroid. Cardiovascular:     Rate and Rhythm: Normal rate.  Pulmonary:     Effort: No respiratory distress.  Abdominal:     General: There is no distension.  Skin:    General: Skin is warm and dry.     Coloration: Skin is not jaundiced.  Neurological:     Mental Status: She is alert and oriented to [place], place, and time.     Imaging:       Labs: 03/16/23 CBC WBC 5.3 4.8 R 4.9 4.5 R     RBC 4.20 4.66 R 4.36 R 4.46 R     Hemoglobin 12.4 13.9 R 13.2 13.5 R 11.6 Low  11.6 Low  11.9 Low   HCT 37.0 42.1 R 38.7 40.1 R 34.0 Low  34.0 Low  35.0 Low   MCV 88.1 90 R 88.9 R 90 R     MCH 29.5 29.8 R  30.3 R     MCHC 33.5 33.0 R 34.0 33.7 R     RDW 13.0 13.1 R 12.8 12.5 R     Platelets 220 249 R 290.0 R        Coags: N/a   TFTs Serum TSH 2.39 09/16/22  Prior Thyroid FNA: n/a  Assessment and Plan: 73 year old female with a history of left thyroid cyst with compressive and cosmetic symptoms.  We discussed treatment options including doing nothing, aspiration, and aspiration + ethanol ablation.  I recommended including ethanol given incidence of recurrence, complications with aspiration alone including bleeding, and low risk nature of the procedure. She is amenable and would like to proceed.  Plan for ultrasound guided left thyroid cyst aspiration and ethanol ablation with local anesthesia at [facility].  Marliss Coots, MD Pager: [phone]    I spent a total of  60 Minutes   in face to face in clinical consultation, greater than 50% of which was counseling/coordinating care for symptomatic left thyroid cyst.

## 2023-06-14 ENCOUNTER — Encounter (HOSPITAL_COMMUNITY): Payer: Self-pay | Admitting: Home Health

## 2023-06-14 ENCOUNTER — Ambulatory Visit
Admission: RE | Admit: 2023-06-14 | Discharge: 2023-06-14 | Disposition: A | Discharge status: Home / Self Care | Payer: PPO | Source: Ambulatory Visit | Attending: Physician Assistant

## 2023-06-14 DIAGNOSIS — E042 Nontoxic multinodular goiter: Secondary | ICD-10-CM | POA: Diagnosis not present

## 2023-06-14 HISTORY — PX: IR RADIOLOGIST EVAL & MGMT: IMG5224

## 2023-06-16 ENCOUNTER — Inpatient Hospital Stay: Admission: RE | Admit: 2023-06-16 | Payer: PPO | Source: Ambulatory Visit

## 2023-06-16 ENCOUNTER — Other Ambulatory Visit (HOSPITAL_COMMUNITY): Payer: Self-pay | Admitting: Interventional Radiology

## 2023-06-16 DIAGNOSIS — E041 Nontoxic single thyroid nodule: Secondary | ICD-10-CM

## 2023-06-29 ENCOUNTER — Ambulatory Visit (HOSPITAL_COMMUNITY)
Admission: RE | Admit: 2023-06-29 | Discharge: 2023-06-29 | Disposition: A | Discharge status: Home / Self Care | Payer: PPO | Source: Ambulatory Visit | Attending: Interventional Radiology | Admitting: Interventional Radiology

## 2023-06-29 VITALS — BP 127/60

## 2023-06-29 DIAGNOSIS — E041 Nontoxic single thyroid nodule: Secondary | ICD-10-CM | POA: Diagnosis not present

## 2023-06-29 MED ORDER — ALCOHOL (ABLYSINOL) 99% IA SOLN
10.0000 mL | Freq: Once | INTRA_ARTERIAL | Status: AC
  Administered 2023-06-29: 10 mL

## 2023-06-29 MED ORDER — LIDOCAINE HCL 1 % IJ SOLN
INTRAMUSCULAR | Status: AC
  Filled 2023-06-29: qty 20

## 2023-07-01 LAB — CYTOLOGY - NON PAP

## 2023-07-06 ENCOUNTER — Ambulatory Visit: Payer: PPO | Admitting: Emergency Medicine

## 2023-07-09 ENCOUNTER — Other Ambulatory Visit: Payer: Self-pay | Admitting: Interventional Radiology

## 2023-07-09 DIAGNOSIS — E041 Nontoxic single thyroid nodule: Secondary | ICD-10-CM

## 2023-07-12 ENCOUNTER — Telehealth: Payer: Self-pay | Admitting: Neurology

## 2023-07-12 NOTE — Telephone Encounter (Signed)
 I called pt and let her know that per Dr. Trevor Mace last office note she did state if no seizures then April 1st she is able to drive.  She states she has not had any.  I told her to call us April 1st and give [place] an update.  She has no DMV form or that I see one has come to [place] to fill out.  She verbalized understanding.  Appreciated call back. She took my name.

## 2023-07-12 NOTE — Telephone Encounter (Signed)
 Pt would like to know when Dr Lucia Gaskins will approve her to drive again. Pt asked that it be noted she has not had any seizure activity since she last saw Dr Lucia Gaskins.

## 2023-07-13 ENCOUNTER — Ambulatory Visit (INDEPENDENT_AMBULATORY_CARE_PROVIDER_SITE_OTHER): Admitting: Emergency Medicine

## 2023-07-13 ENCOUNTER — Encounter: Payer: Self-pay | Admitting: Emergency Medicine

## 2023-07-13 VITALS — BP 122/72 | HR 52 | Temp 98.5°F | Ht 65.0 in | Wt 194.0 lb

## 2023-07-13 DIAGNOSIS — E785 Hyperlipidemia, unspecified: Secondary | ICD-10-CM

## 2023-07-13 DIAGNOSIS — E041 Nontoxic single thyroid nodule: Secondary | ICD-10-CM | POA: Diagnosis not present

## 2023-07-13 DIAGNOSIS — I340 Nonrheumatic mitral (valve) insufficiency: Secondary | ICD-10-CM | POA: Diagnosis not present

## 2023-07-13 DIAGNOSIS — M150 Primary generalized (osteo)arthritis: Secondary | ICD-10-CM

## 2023-07-13 DIAGNOSIS — I38 Endocarditis, valve unspecified: Secondary | ICD-10-CM | POA: Diagnosis not present

## 2023-07-13 DIAGNOSIS — I10 Essential (primary) hypertension: Secondary | ICD-10-CM | POA: Diagnosis not present

## 2023-07-13 DIAGNOSIS — G4733 Obstructive sleep apnea (adult) (pediatric): Secondary | ICD-10-CM

## 2023-07-13 DIAGNOSIS — G40909 Epilepsy, unspecified, not intractable, without status epilepticus: Secondary | ICD-10-CM | POA: Diagnosis not present

## 2023-07-13 NOTE — Assessment & Plan Note (Signed)
 BP Readings from Last 3 Encounters:  07/13/23 122/72  06/29/23 127/60  06/14/23 131/67  Well-controlled hypertension Continue valsartan 320 mg daily, metoprolol succinate 25 mg daily, chlorthalidone 25 mg daily Cardiovascular risks associated with hypertension discussed

## 2023-07-13 NOTE — Progress Notes (Signed)
 Lisa Miles 73 y.o.   Chief Complaint  Patient presents with  . Follow-up    Patient here for 6 month f/u. Pt c/o of having headaches everyday she states its not bad headaches but it is there she is not taking anything for this and doesn't really know when it started.    HISTORY OF PRESENT ILLNESS: This is a 73 y.o. female here for 33-month follow-up of multiple chronic medical conditions Accompanied by husband.  Overall doing well. Has occasional tolerable headaches.  Does not need to take medication for it No other complaints or medical concerns today.  HPI   Prior to Admission medications   Medication Sig Start Date End Date Taking? Authorizing Provider  aspirin EC 81 MG tablet Take 81 mg by mouth daily. Swallow whole.   Yes [provider]  calcium carbonate (CALCIUM 600) 600 MG TABS tablet    Yes [provider]  chlorthalidone (HYGROTON) 25 MG tablet TAKE ONE-HALF OF A TABLET BY MOUTH DAILY 05/03/23  Yes Marykay Lex, MD  Cholecalciferol (VITAMIN D) 50 MCG (2000 UT) CAPS Take 2,000 Units by mouth daily.   Yes [provider]  esomeprazole (NEXIUM) 20 MG capsule TAKE 1 CAPSULE(20 MG) BY MOUTH TWICE DAILY BEFORE A MEAL 04/09/23  Yes Zehr, Jessica D, PA-C  fluticasone (FLONASE) 50 MCG/ACT nasal spray Place into the nose.   Yes [provider]  hydroxypropyl methylcellulose / hypromellose (ISOPTO TEARS / GONIOVISC) 2.5 % ophthalmic solution Place 1 drop into both eyes in the morning and at bedtime.   Yes [provider]  ibuprofen (ADVIL) 600 MG tablet Take 600 mg by mouth every 8 (eight) hours as needed.   Yes [provider]  [name] (KEPPRA) 500 MG tablet Take 1 tablet (500 mg total) by mouth 2 (two) times daily. 05/04/23  Yes Anson Fret, MD  metoprolol succinate (TOPROL-XL) 25 MG 24 hr tablet TAKE 1 TABLET(25 MG) BY MOUTH DAILY 12/29/22  Yes Marykay Lex, MD  rosuvastatin (CRESTOR) 10 MG tablet TAKE 1  TABLET(10 MG) BY MOUTH DAILY 02/27/23  Yes Racquelle Hyser, Eilleen Kempf, MD  traMADol (ULTRAM) 50 MG tablet Take 50 mg by mouth every 6 (six) hours as needed.   Yes [provider]  valsartan (DIOVAN) 320 MG tablet TAKE 1 TABLET(320 MG) BY MOUTH DAILY 12/20/22  Yes Dashawn Golda, Eilleen Kempf, MD  amoxicillin (AMOXIL) 500 MG tablet Take 500 mg by mouth. Take 4 tablets before dental procedures. Patient not taking: Reported on 07/13/2023    [provider]    No Known Allergies  Patient Active Problem List   Diagnosis Date Noted  . Thyroid nodule 03/22/2023  . Seizure disorder (HCC) 01/06/2023  . Memory problem 07/07/2022  . Chronic nonintractable headache 07/07/2022  . PAC (premature atrial contraction) -wiht Atrial Runs 07/19/2021  . Mild aortic stenosis 06/06/2021  . Valvular heart disease 02/20/2021  . OSA (obstructive sleep apnea) 11/02/2019  . Non-restorative sleep 08/24/2019  . Primary osteoarthritis involving multiple joints 04/01/2018  . OAB (overactive bladder) 04/01/2018  . Thyromegaly 03/28/2013  . Essential hypertension 07/14/2012  . Dyslipidemia 05/30/2009  . Moderate to severe mitral regurgitation 05/30/2009    Past Medical History:  Diagnosis Date  . Aortic stenosis, mild-moderate 07/2012   TTE October 21: Moderate aortic valve thickening with mild to moderate AI & mild AS; TEE: mild to moderate AI & MIld AS  . Arthritis    Phreesia 11/06/2019  . Asthma   . Bilateral bunions  Bunionectomies performed  . Bronchitis, chronic (HCC)   . Cataract    Phreesia 11/06/2019  . Heart disease   . Hyperlipidemia   . Hypertension    Good control  . Inguinodynia    Bilateral groin pain  . Lower extremity edema    Chronic. Venous Duplex 11/06/11 SUMMARY: 1) Bilateral Lower Extremities: No evidence of DVT or thrombophlebitis.  2) Right Common Femoral Vein: Demonstrated mild valvular insufficiency with a greater than (1) sec of duration. Mildly abnormal LE Venous duplex  Doppler.  . Obesity   . Palpitations    Relatively well controlled  . Scoliosis    DG Chest 2 View x-ray on 07/30/11 by Dr. Cleta Alberts shows a scoliosis.  . Severe mitral regurgitation by prior echocardiogram 07/2012   Mild AMVL prolapse, Mod MR -- no MVP noted in 01/2018 -> 11/'21: Severe MR due to restricted movement of P3 scallop of PMVL (IIIB) due to incomplete leaflet coaptation -> thickened leaflets consistent with Rheumatic Heart Valve Disease.    Past Surgical History:  Procedure Laterality Date  . ABDOMINAL HYSTERECTOMY    . ANKLE ARTHROSCOPY Left   . BUNIONECTOMY Bilateral   . CARDIAC EVENT MONITOR  03/2020   (04/05/2020 -05/04/2020): Mostly NSR.  Heart rate range 42-148 bpm.  3 short bursts of 4-5 beats NSVT (asymptomatic).  116 beat run of PAT.  Rare PACs and PVCs.  Some bigeminy and trigeminy.  [name] Kitchen CARDIOPULMONARY EXERCISE TEST (CPX)  05/09/2020   (done with Ex St Echo): Normal Functional Capacity. No indication for CP limitations.  Body Habitus contributes to Exercise Intolerance.  [name] Kitchen EXERCISE STRESS ECHO  05/09/2020   TTE shows normal EF 65% with LVH No pericardial effusion Normal RV size and function Moderate appearing rheumatic MR/AR.;; -- NO COMMENT ON EXERCISE EFFECT ON MR!!! (even though this was the reason for ordering the test)  . EYE SURGERY N/A    Phreesia 11/06/2019  . IR RADIOLOGIST EVAL & MGMT  06/14/2023  . KNEE ARTHROSCOPY Right   . Left knee open surgery     pt thinks she only had shots in L knee, not surgery  . RIGHT/LEFT HEART CATH AND CORONARY ANGIOGRAPHY N/A 03/28/2020   RIGHT/LEFT HEART CATH AND CORONARY ANGIOGRAPHY;  Marykay Lex, MD;  Location: [facility] INVASIVE CV LAB; angiographically normal coronary arteries.  Cloacal LM. Normal RHC Pressures (PAP 40/13 - mean 22 mmHg, PCWP , V wave 25 mmHg c/w MR, LVEDP 15 mmHg); CO/CI 5.02 / 2.45 (reduced).  . TEE WITHOUT CARDIOVERSION N/A 03/13/2020   Procedure: TRANSESOPHAGEAL ECHOCARDIOGRAM (TEE);  Surgeon: Sande Rives, MD;  [facility] ENDOSCOPY;;; Severe MR 2/2 restricted P3 scallop of PMVL (IIIB) w/ incomplete leaflet coaptation.  Thickened /degenerative leaflets c/w Rheumatic Valvular Heart Disease.  2D PISA radius 0.9 cm with 2D ERO 0.26 cm2 with R Vol 62 cc. PV blunting w/ BP 109/60 mmHg. No MS. Mild-mod AI / Mild AS. Gr 2 plaque Aorta  . TOTAL ABDOMINAL HYSTERECTOMY    . TRANSTHORACIC ECHOCARDIOGRAM  05/09/2021   EF 60 to 65%.  No RWMA.  GR 1 DD with elevated LAP-moderately dilated left atrium (LA).  Normal RV size and function.  Moderate to severe MR with no MS.  Moderate aortic calcification/sclerosis with only mild stenosis.  Ascending aorta 39 mm.  . TRANSTHORACIC ECHOCARDIOGRAM  01/2018   a) Normal LV size.  Mod Conc LVH.  EF 60-65%.  No R WMA.  GR 1 DD (high P).  Mild AS (MG 16 mmHg) w/  Mod AI.  Severe LA dilation.  Mild MR.; 01/2020:  EF 65-70%. No RWMA.  Severe MR w/ restricted PMVL movement.  (Recommend TEE).  Mod AoV thickening w/ mild to mod AS & Mod AI.  Nl RV fxn.  Mildly elevated filling P. Severe LA dilation.  Luci Bank Patch Monitor  05/2021   Predominant Rhythm Is Sinus Rhythm - HR range 45 -139 bpm & avg 74 bpm.  Occ PACs (2.9%) w/ some couplets and triplets,& rare PVCs. 20 Atrial Runs: Fastest - 6 beats w/ rate 235 bpm; Longest 12 beats @ 138 bpm (~ 5.2 sec); Symptoms were noted with PACs and PVCs not with atrial tachycardia runs.    Social History   Socioeconomic History  . Marital status: Married    Spouse name: Molly Maduro  . Number of children: 2  . Years of education: Not on file  . Highest education level: Not on file  Occupational History  . Occupation: production    Employer: [employer]    Comment: check printing/shipping  Tobacco Use  . Smoking status: Never  . Smokeless tobacco: Never  Vaping Use  . Vaping status: Never Used  Substance and Sexual Activity  . Alcohol use: No  . Drug use: No  . Sexual activity: Never  Other Topics Concern  . Not on file  Social  History Narrative   Lives with her husband.  Their children live nearby.   Right handed   Caffeine: 1 cup/day   Social Drivers of Corporate investment banker Strain: Low Risk  (09/02/2022)   Overall Financial Resource Strain (CARDIA)   . Difficulty of Paying Living Expenses: Not hard at all  Food Insecurity: No Food Insecurity (09/02/2022)   Hunger Vital Sign   . Worried About Programme researcher, broadcasting/film/video in the Last Year: Never true   . Ran Out of Food in the Last Year: Never true  Transportation Needs: No Transportation Needs (09/02/2022)   PRAPARE - Transportation   . Lack of Transportation (Medical): No   . Lack of Transportation (Non-Medical): No  Physical Activity: Insufficiently Active (09/02/2022)   Exercise Vital Sign   . Days of Exercise per Week: 3 days   . Minutes of Exercise per Session: 20 min  Stress: Stress Concern Present (09/02/2022)   [employer] of Occupational Health - Occupational Stress Questionnaire   . Feeling of Stress : To some extent  Social Connections: Moderately Isolated (09/02/2022)   Social Connection and Isolation Panel [NHANES]   . Frequency of Communication with Friends and Family: Three times a week   . Frequency of Social Gatherings with Friends and Family: Three times a week   . Attends Religious Services: Never   . Active Member of Clubs or Organizations: No   . Attends Banker Meetings: Never   . Marital Status: Married  Catering manager Violence: Not At Risk (09/02/2022)   Humiliation, Afraid, Rape, and Kick questionnaire   . Fear of Current or Ex-Partner: No   . Emotionally Abused: No   . Physically Abused: No   . Sexually Abused: No    Family History  Problem Relation Age of Onset  . Arthritis Mother   . Hyperlipidemia Mother   . Diabetes Mother   . Gout Mother   . Hypertension Mother   . Dementia Mother   . Cancer Father        GI cancer  . Hypertension Brother   . Diabetes Brother   . Hypertension Brother   .  Diabetes  Maternal Grandfather   . Migraines Neg Hx   . Headache Neg Hx      Review of Systems  Constitutional: Negative.  Negative for chills and fever.  HENT: Negative.  Negative for congestion and sore throat.   Respiratory: Negative.  Negative for cough and shortness of breath.   Cardiovascular: Negative.  Negative for chest pain and palpitations.  Gastrointestinal:  Negative for abdominal pain, diarrhea, nausea and vomiting.  Genitourinary: Negative.  Negative for dysuria and hematuria.  Skin: Negative.  Negative for rash.  Neurological:  Positive for headaches.    Vitals:   07/13/23 1037  BP: 122/72  Pulse: (!) 52  Temp: 98.5 F (36.9 C)  SpO2: 93%    Physical Exam Vitals reviewed.  Constitutional:      Appearance: Normal appearance.  HENT:     Head: Normocephalic.     Mouth/Throat:     Mouth: Mucous membranes are moist.     Pharynx: Oropharynx is clear.  Eyes:     Extraocular Movements: Extraocular movements intact.     Conjunctiva/sclera: Conjunctivae normal.     Pupils: Pupils are equal, round, and reactive to light.  Cardiovascular:     Rate and Rhythm: Normal rate and regular rhythm.     Pulses: Normal pulses.     Heart sounds: Murmur heard.  Pulmonary:     Effort: Pulmonary effort is normal.     Breath sounds: Normal breath sounds.  Musculoskeletal:     Cervical back: No tenderness.     Right lower leg: No edema.     Left lower leg: No edema.  Lymphadenopathy:     Cervical: No cervical adenopathy.  Skin:    General: Skin is warm and dry.     Capillary Refill: Capillary refill takes less than 2 seconds.  Neurological:     General: No focal deficit present.     Mental Status: She is alert and oriented to Lung, place, and time.  Psychiatric:        Mood and Affect: Mood normal.        Behavior: Behavior normal.     ASSESSMENT & PLAN: A total of 47 minutes was spent with the patient and counseling/coordination of care regarding preparing for this  visit, review of most recent office visit notes, review of multiple chronic medical conditions and their management, review of all medications, review of most recent bloodwork results, review of health maintenance items, education on nutrition, prognosis, documentation, and need for follow up.   Problem List Items Addressed This Visit       Cardiovascular and Mediastinum   Moderate to severe mitral regurgitation (Chronic)   Stable findings on follow-up echocardiogram       Essential hypertension - Primary (Chronic)   BP Readings from Last 3 Encounters:  07/13/23 122/72  06/29/23 127/60  06/14/23 131/67  Well-controlled hypertension Continue valsartan 320 mg daily, metoprolol succinate 25 mg daily, chlorthalidone 25 mg daily Cardiovascular risks associated with hypertension discussed       Valvular heart disease (Chronic)   Clinically stable.  No signs of congestive heart failure.        Respiratory   OSA (obstructive sleep apnea) (Chronic)   Not on CPAP.        Endocrine   Thyroid nodule   Recently evaluated by ENT Had aspiration of nodule done recently        Nervous and Auditory   Seizure disorder [facility])   Recently started on Keppra  500 mg twice a day by neurology service  Tolerating medication well Probably contributing to occasional headaches        Musculoskeletal and Integument   Primary osteoarthritis involving multiple joints   Reviewed xrays that have been done over the years including MRI. Shows arthritis in multiple joints. She was advised to use arthritis meds - tylenol or motrin          Other   Dyslipidemia (Chronic)   Chronic stable condition Continue rosuvastatin 10 mg daily Diet and nutrition discussed      Patient Instructions  Health Maintenance After Age 25 After age 23, you are at a higher risk for certain long-term diseases and infections as well as injuries from falls. Falls are a major cause of broken bones and head injuries in  people who are older than age 38. Getting regular preventive care can help to keep you healthy and well. Preventive care includes getting regular testing and making lifestyle changes as recommended by your health care provider. Talk with your health care provider about: Which screenings and tests you should have. A screening is a test that checks for a disease when you have no symptoms. A diet and exercise plan that is right for you. What should I know about screenings and tests to prevent falls? Screening and testing are the best ways to find a health problem early. Early diagnosis and treatment give you the best chance of managing medical conditions that are common after age 67. Certain conditions and lifestyle choices may make you more likely to have a fall. Your health care provider may recommend: Regular vision checks. Poor vision and conditions such as cataracts can make you more likely to have a fall. If you wear glasses, make sure to get your prescription updated if your vision changes. Medicine review. Work with your health care provider to regularly review all of the medicines you are taking, including over-the-counter medicines. Ask your health care provider about any side effects that may make you more likely to have a fall. Tell your health care provider if any medicines that you take make you feel dizzy or sleepy. Strength and balance checks. Your health care provider may recommend certain tests to check your strength and balance while standing, walking, or changing positions. Foot health exam. Foot pain and numbness, as well as not wearing proper footwear, can make you more likely to have a fall. Screenings, including: Osteoporosis screening. Osteoporosis is a condition that causes the bones to get weaker and break more easily. Blood pressure screening. Blood pressure changes and medicines to control blood pressure can make you feel dizzy. Depression screening. You may be more likely to  have a fall if you have a fear of falling, feel depressed, or feel unable to do activities that you used to do. Alcohol use screening. Using too much alcohol can affect your balance and may make you more likely to have a fall. Follow these instructions at home: Lifestyle Do not drink alcohol if: Your health care provider tells you not to drink. If you drink alcohol: Limit how much you have to: 0-1 drink a day for women. 0-2 drinks a day for men. Know how much alcohol is in your drink. In the [place] one drink equals one 12 oz bottle of beer (355 mL), one 5 oz glass of wine (148 mL), or one 1 oz glass of hard liquor (44 mL). Do not use any products that contain nicotine or tobacco. These products include cigarettes, chewing  tobacco, and vaping devices, such as e-cigarettes. If you need help quitting, ask your health care provider. Activity  Follow a regular exercise program to stay fit. This will help you maintain your balance. Ask your health care provider what types of exercise are appropriate for you. If you need a cane or walker, use it as recommended by your health care provider. Wear supportive shoes that have nonskid soles. Safety  Remove any tripping hazards, such as rugs, cords, and clutter. Install safety equipment such as grab bars in bathrooms and safety rails on stairs. Keep rooms and walkways well-lit. General instructions Talk with your health care provider about your risks for falling. Tell your health care provider if: You fall. Be sure to tell your health care provider about all falls, even ones that seem minor. You feel dizzy, tiredness (fatigue), or off-balance. Take over-the-counter and prescription medicines only as told by your health care provider. These include supplements. Eat a healthy diet and maintain a healthy weight. A healthy diet includes low-fat dairy products, low-fat (lean) meats, and fiber from whole grains, beans, and lots of fruits and vegetables. Stay  current with your vaccines. Schedule regular health, dental, and eye exams. Summary Having a healthy lifestyle and getting preventive care can help to protect your health and wellness after age 28. Screening and testing are the best way to find a health problem early and help you avoid having a fall. Early diagnosis and treatment give you the best chance for managing medical conditions that are more common for people who are older than age 53. Falls are a major cause of broken bones and head injuries in people who are older than age 27. Take precautions to prevent a fall at home. Work with your health care provider to learn what changes you can make to improve your health and wellness and to prevent falls. This information is not intended to replace advice given to you by your health care provider. Make sure you discuss any questions you have with your health care provider. Document Revised: 09/02/2020 Document Reviewed: 09/02/2020 Elsevier Patient Education  2024 Elsevier Inc.     Edwina Barth, MD [facility]

## 2023-07-13 NOTE — Patient Instructions (Signed)
 Health Maintenance After Age 73 After age 4, you are at a higher risk for certain long-term diseases and infections as well as injuries from falls. Falls are a major cause of broken bones and head injuries in people who are older than age 47. Getting regular preventive care can help to keep you healthy and well. Preventive care includes getting regular testing and making lifestyle changes as recommended by your health care provider. Talk with your health care provider about: Which screenings and tests you should have. A screening is a test that checks for a disease when you have no symptoms. A diet and exercise plan that is right for you. What should I know about screenings and tests to prevent falls? Screening and testing are the best ways to find a health problem early. Early diagnosis and treatment give you the best chance of managing medical conditions that are common after age 37. Certain conditions and lifestyle choices may make you more likely to have a fall. Your health care provider may recommend: Regular vision checks. Poor vision and conditions such as cataracts can make you more likely to have a fall. If you wear glasses, make sure to get your prescription updated if your vision changes. Medicine review. Work with your health care provider to regularly review all of the medicines you are taking, including over-the-counter medicines. Ask your health care provider about any side effects that may make you more likely to have a fall. Tell your health care provider if any medicines that you take make you feel dizzy or sleepy. Strength and balance checks. Your health care provider may recommend certain tests to check your strength and balance while standing, walking, or changing positions. Foot health exam. Foot pain and numbness, as well as not wearing proper footwear, can make you more likely to have a fall. Screenings, including: Osteoporosis screening. Osteoporosis is a condition that causes  the bones to get weaker and break more easily. Blood pressure screening. Blood pressure changes and medicines to control blood pressure can make you feel dizzy. Depression screening. You may be more likely to have a fall if you have a fear of falling, feel depressed, or feel unable to do activities that you used to do. Alcohol use screening. Using too much alcohol can affect your balance and may make you more likely to have a fall. Follow these instructions at home: Lifestyle Do not drink alcohol if: Your health care provider tells you not to drink. If you drink alcohol: Limit how much you have to: 0-1 drink a day for women. 0-2 drinks a day for men. Know how much alcohol is in your drink. In the [place] one drink equals one 12 oz bottle of beer (355 mL), one 5 oz glass of wine (148 mL), or one 1 oz glass of hard liquor (44 mL). Do not use any products that contain nicotine or tobacco. These products include cigarettes, chewing tobacco, and vaping devices, such as e-cigarettes. If you need help quitting, ask your health care provider. Activity  Follow a regular exercise program to stay fit. This will help you maintain your balance. Ask your health care provider what types of exercise are appropriate for you. If you need a cane or walker, use it as recommended by your health care provider. Wear supportive shoes that have nonskid soles. Safety  Remove any tripping hazards, such as rugs, cords, and clutter. Install safety equipment such as grab bars in bathrooms and safety rails on stairs. Keep rooms and walkways  well-lit. General instructions Talk with your health care provider about your risks for falling. Tell your health care provider if: You fall. Be sure to tell your health care provider about all falls, even ones that seem minor. You feel dizzy, tiredness (fatigue), or off-balance. Take over-the-counter and prescription medicines only as told by your health care provider. These include  supplements. Eat a healthy diet and maintain a healthy weight. A healthy diet includes low-fat dairy products, low-fat (lean) meats, and fiber from whole grains, beans, and lots of fruits and vegetables. Stay current with your vaccines. Schedule regular health, dental, and eye exams. Summary Having a healthy lifestyle and getting preventive care can help to protect your health and wellness after age 11. Screening and testing are the best way to find a health problem early and help you avoid having a fall. Early diagnosis and treatment give you the best chance for managing medical conditions that are more common for people who are older than age 28. Falls are a major cause of broken bones and head injuries in people who are older than age 48. Take precautions to prevent a fall at home. Work with your health care provider to learn what changes you can make to improve your health and wellness and to prevent falls. This information is not intended to replace advice given to you by your health care provider. Make sure you discuss any questions you have with your health care provider. Document Revised: 09/02/2020 Document Reviewed: 09/02/2020 Elsevier Patient Education  2024 [employer].

## 2023-07-13 NOTE — Assessment & Plan Note (Signed)
Chronic stable condition.  Continue rosuvastatin 10 mg daily. Diet and nutrition discussed. 

## 2023-07-13 NOTE — Assessment & Plan Note (Signed)
 Clinically stable.  No signs of congestive heart failure.

## 2023-07-13 NOTE — Assessment & Plan Note (Signed)
 Stable findings on follow-up echocardiogram

## 2023-07-13 NOTE — Assessment & Plan Note (Signed)
 Recently started on Keppra 500 mg twice a day by neurology service  Tolerating medication well Probably contributing to occasional headaches

## 2023-07-13 NOTE — Assessment & Plan Note (Signed)
 Reviewed xrays that have been done over the years including MRI. Shows arthritis in multiple joints. She was advised to use arthritis meds - tylenol or motrin

## 2023-07-13 NOTE — Assessment & Plan Note (Signed)
 Recently evaluated by ENT Had aspiration of nodule done recently

## 2023-07-20 ENCOUNTER — Encounter: Payer: Self-pay | Admitting: Cardiology

## 2023-07-20 ENCOUNTER — Ambulatory Visit: Payer: PPO | Attending: Cardiology | Admitting: Cardiology

## 2023-07-20 VITALS — BP 124/70 | HR 51 | Ht 65.0 in | Wt 193.0 lb

## 2023-07-20 DIAGNOSIS — I491 Atrial premature depolarization: Secondary | ICD-10-CM | POA: Diagnosis not present

## 2023-07-20 DIAGNOSIS — I38 Endocarditis, valve unspecified: Secondary | ICD-10-CM

## 2023-07-20 DIAGNOSIS — I350 Nonrheumatic aortic (valve) stenosis: Secondary | ICD-10-CM

## 2023-07-20 DIAGNOSIS — I340 Nonrheumatic mitral (valve) insufficiency: Secondary | ICD-10-CM

## 2023-07-20 DIAGNOSIS — I10 Essential (primary) hypertension: Secondary | ICD-10-CM | POA: Diagnosis not present

## 2023-07-20 DIAGNOSIS — E785 Hyperlipidemia, unspecified: Secondary | ICD-10-CM | POA: Diagnosis not present

## 2023-07-20 NOTE — Assessment & Plan Note (Signed)
 Recent echo downgraded to mild regurgitation, no intervention needed. - Continue annual echocardiogram.

## 2023-07-20 NOTE — Assessment & Plan Note (Signed)
 Lipid panel not completed last year, important for monitoring. - Order lipid panel if not ordered by primary care physician.

## 2023-07-20 NOTE — Assessment & Plan Note (Signed)
 As long as there continues to be interval change in valvular disease, we will continue annual echocardiograms, otherwise can switch to every 2 years. She does have a standing prescription for SBE prophylaxis antibiotics for procedures based on the mitral valve disease, however at this level of MR, not yet indicated.

## 2023-07-20 NOTE — Assessment & Plan Note (Signed)
 Blood pressure well-controlled on chlorthalidone and toprol. - Continue chlorthalidone 12.5 mg daily and Toprol-XL 25 mg daily.

## 2023-07-20 NOTE — Assessment & Plan Note (Addendum)
 Completely asymptomatic now on current dose of Toprol. -Continue Toprol-XL 25 mg daily No longer requiring as needed Lopressor

## 2023-07-20 NOTE — Progress Notes (Signed)
 Cardiology Office Note:  .   Date:  07/20/2023  ID:  Lisa Miles, DOB 09-Jan-1951, MRN [id number] PCP: Georgina Quint, MD  [facility] Providers Cardiologist:  Bryan Lemma, MD     Chief Complaint  Patient presents with   Follow-up    Annual follow-up   Cardiac Valve Problem    Mild aortic and mitral valve disease.  No major issues.  Echo results reviewed   Palpitations    Completely stable    Patient Profile: .     Lisa Miles is a mildly obese 73 y.o. female with a PMH notable for valvular heart disease (moderate to severe MR and mild to moderate AS/AR), PACs, atrial tachycardia, HTN, HLD and OSA (not on CPAP) who presents here for annual follow-up at the request of Georgina Quint, *.    Lisa Miles was last seen in March 2024 for by Bernadene Jackson, NP.  She was doing well regarding standpoint with no cardiac symptoms to speak of.  Most major issue was postnasal drip and cough for which she was referred to ENT.  Subjective  Discussed the use of AI scribe software for clinical note transcription with the patient, who gave verbal consent to proceed.  History of Present Illness   Lisa Miles is a 73 year old female with aortic and mitral valve issues who presents for a follow-up on her heart condition.  She has been undergoing regular monitoring for her aortic and mitral valve function. Recent echocardiograms show that the mitral valve remains stable, while the aortic valve has experienced slight worsening. However, neither valve currently necessitates intervention.  She experiences no significant symptoms such as shortness of breath, chest pain, or palpitations. There is no leg swelling, dizziness, or orthopnea. She sleeps on one and a half pillows without experiencing nocturnal dyspnea. Previously, she had episodes of skipped beats and tachycardia, but these have resolved.  Her current medications include chlorthalidone, taken as 1/2 of a 25 mg  tablet daily, and Toprol XL 25 mg tablet daily. She also takes Diovan 320 mg d. She mentions taking approximately   She recently underwent a procedure to remove mucus from her throat, which she described as a 'big lump.' She does not believe this is related to her heart condition.       Objective   Medications - ASA 81 mg dialy - Chlorthalidone 12.5 mg daily (1/2 of 25 mg tab) - Toprol 25 mg tablet - Diovan 320 mg - Keppra 500 mg BID - Nexium 20 mg BID w/ meals  Studies Reviewed: [name] Kitchen   EKG Interpretation Date/Time:  Tuesday July 20 2023 10:10:55 EDT Ventricular Rate:  51 PR Interval:  132 QRS Duration:  90 QT Interval:  426 QTC Calculation: 392 R Axis:   43  Text Interpretation: Sinus bradycardia When compared with ECG of 28-Mar-2020 05:51, Abberant conduction is no longer Present QT has shortened Confirmed by Bryan Lemma ([id number]) on 07/20/2023 10:20:59 AM    Echo January 2024: EF 60 to 65%.  No RWMA.  GR 2 DD.  Mildly elevated PAP.  Moderate MR.  Moderate TR.  AoV calcification with mild to moderate AS and moderate AI.  ECHO January 2025: Hyper dynamic LV function with EF of 70 to 75%.  No RWMA.  GR 2 DD with severe LA dilation..  Mild MV thickening.  Possibly functional bicuspid aortic valve with left and noncoronary cusp fusion.  Mean gradient 13 mmHg.-Mild to moderate AS with mild  MR.  Normal RV.   Risk Assessment/Calculations:               Physical Exam:   VS:  BP 124/70 (BP Location: Right Leg, Patient Position: Sitting, Cuff Size: Normal)   Pulse (!) 51   Ht 5\' 5"  (1.651 m)   Wt 193 lb (87.5 kg)   SpO2 95%   BMI 32.12 kg/m    Wt Readings from Last 3 Encounters:  07/20/23 193 lb (87.5 kg)  07/13/23 194 lb (88 kg)  05/04/23 192 lb (87.1 kg)    GEN: Well nourished, well developed in no acute distress; mildly obese but otherwise well-groomed. NECK: No JVD; No carotid bruits CARDIAC: Normal S1, S2; RRR, no rubs, gallops; AS 2/6 to 3/6 SEM at RUSB, AI murmur 1/4  DM at RUSB, 1/6 HSM-MR murmur. RESPIRATORY:  Clear to auscultation without rales, wheezing or rhonchi ; nonlabored, good air movement. ABDOMEN: Soft, non-tender, non-distended EXTREMITIES:  No edema; No deformity      ASSESSMENT AND PLAN: .    Problem List Items Addressed This Visit       Cardiology Problems   Essential hypertension (Chronic)   Blood pressure well-controlled on chlorthalidone and toprol. - Continue chlorthalidone 12.5 mg daily and Toprol-XL 25 mg daily.      Relevant Orders   EKG 12-Lead (Completed)   Mild aortic stenosis (Chronic)   Echocardiogram shows worsening stenosis-now read as moderate, not requiring intervention. Asymptomatic, stable condition. - Continue annual echocardiogram. - Educated on symptoms: shortness of breath, chest pain, palpitations.      Relevant Orders   EKG 12-Lead (Completed)   ECHOCARDIOGRAM COMPLETE   Moderate to severe mitral regurgitation (Chronic)   Recent echo downgraded to mild regurgitation, no intervention needed. - Continue annual echocardiogram.      Relevant Orders   EKG 12-Lead (Completed)   ECHOCARDIOGRAM COMPLETE   PAC (premature atrial contraction) -with atrial Runs (Chronic)   Completely asymptomatic now on current dose of Toprol. -Continue Toprol-XL 25 mg daily No longer requiring as needed Lopressor      Relevant Orders   EKG 12-Lead (Completed)   Valvular heart disease - Primary (Chronic)   As long as there continues to be interval change in valvular disease, we will continue annual echocardiograms, otherwise can switch to every 2 years. She does have a standing prescription for SBE prophylaxis antibiotics for procedures based on the mitral valve disease, however at this level of MR, not yet indicated.      Relevant Orders   EKG 12-Lead (Completed)   ECHOCARDIOGRAM COMPLETE     Other   Dyslipidemia (Chronic)   Lipid panel not completed last year, important for monitoring. - Order lipid panel if  not ordered by primary care physician.      Relevant Orders   EKG 12-Lead (Completed)       Follow-Up: Return in about 1 year (around 07/19/2024) for 1 Yr Follow-up, [facility] [address] Office with APP or MD. Bryan Lemma, MD or Bernadene Derousse, NP         Signed, Marykay Lex, MD, MS Bryan Lemma, M.D., M.S. Interventional Cardiologist  [facility]  Pager # [phone] Phone # [phone] [address] [place], [place] [address]

## 2023-07-20 NOTE — Assessment & Plan Note (Signed)
 Echocardiogram shows worsening stenosis-now read as moderate, not requiring intervention. Asymptomatic, stable condition. - Continue annual echocardiogram. - Educated on symptoms: shortness of breath, chest pain, palpitations.

## 2023-07-20 NOTE — Patient Instructions (Addendum)
 Medication Instructions:   No changes *If you need a refill on your cardiac medications before your next appointment, please call your pharmacy*   Lab Work: If your primary dose not order labs this year , please call the office so Dr Herbie Baltimore can  order them.    Testing/Procedures: Will schedule in Feb 2026 at [address] Your physician has requested that you have an echocardiogram. Echocardiography is a painless test that uses sound waves to create images of your heart. It provides your doctor with information about the size and shape of your heart and how well your heart's chambers and valves are working. This procedure takes approximately one hour. There are no restrictions for this procedure. Please do NOT wear cologne, perfume, aftershave, or lotions (deodorant is allowed). Please arrive 15 minutes prior to your appointment time.  Please note: We ask at that you not bring children with you during ultrasound (echo/ vascular) testing. Due to room size and safety concerns, children are not allowed in the ultrasound rooms during exams. Our front office staff cannot provide observation of children in our lobby area while testing is being conducted. An adult accompanying a patient to their appointment will only be allowed in the ultrasound room at the discretion of the ultrasound technician under special circumstances. We apologize for any inconvenience.     Follow-Up: At [facility], you and your health needs are our priority.  As part of our continuing mission to provide you with exceptional heart care, we have created designated Provider Care Teams.  These Care Teams include your primary Cardiologist (physician) and Advanced Practice Providers (APPs -  Physician Assistants and Nurse Practitioners) who all work together to provide you with the care you need, when you need it.     Your next appointment:   1 year(s)  The format for your next appointment:   In  [place]  Provider:   Bryan Lemma, MD  or Bernadene Puskas, NP       Other Instructions   s

## 2023-07-26 NOTE — Telephone Encounter (Signed)
 Pt called back and is letting us know that she has had no seizures.  From Dr. Trevor Mace last note it states if no seizures can drive [phone].  I relayed this to pt.  I would inform Dr. Lucia Gaskins and if any change from this would call her back, but did not anticipate any.  She has f/u apt with Maralyn Sago NP in 10/2023.  She appreciated taking her call.

## 2023-07-27 ENCOUNTER — Encounter (HOSPITAL_COMMUNITY): Payer: Self-pay

## 2023-07-27 ENCOUNTER — Emergency Department (HOSPITAL_COMMUNITY)

## 2023-07-27 ENCOUNTER — Observation Stay (HOSPITAL_COMMUNITY)
Admission: EM | Admit: 2023-07-27 | Discharge: 2023-07-29 | Disposition: A | Discharge status: Still In Hospital | Attending: Emergency Medicine | Admitting: Emergency Medicine

## 2023-07-27 ENCOUNTER — Other Ambulatory Visit: Payer: Self-pay

## 2023-07-27 DIAGNOSIS — S2232XA Fracture of one rib, left side, initial encounter for closed fracture: Principal | ICD-10-CM | POA: Insufficient documentation

## 2023-07-27 DIAGNOSIS — S2231XA Fracture of one rib, right side, initial encounter for closed fracture: Secondary | ICD-10-CM | POA: Diagnosis not present

## 2023-07-27 DIAGNOSIS — M47814 Spondylosis without myelopathy or radiculopathy, thoracic region: Secondary | ICD-10-CM | POA: Diagnosis not present

## 2023-07-27 DIAGNOSIS — R1084 Generalized abdominal pain: Secondary | ICD-10-CM | POA: Diagnosis not present

## 2023-07-27 DIAGNOSIS — S22028A Other fracture of second thoracic vertebra, initial encounter for closed fracture: Secondary | ICD-10-CM

## 2023-07-27 DIAGNOSIS — S3991XA Unspecified injury of abdomen, initial encounter: Secondary | ICD-10-CM | POA: Diagnosis present

## 2023-07-27 DIAGNOSIS — I517 Cardiomegaly: Secondary | ICD-10-CM | POA: Diagnosis not present

## 2023-07-27 DIAGNOSIS — M160 Bilateral primary osteoarthritis of hip: Secondary | ICD-10-CM | POA: Diagnosis not present

## 2023-07-27 DIAGNOSIS — M47812 Spondylosis without myelopathy or radiculopathy, cervical region: Secondary | ICD-10-CM | POA: Diagnosis not present

## 2023-07-27 DIAGNOSIS — R2689 Other abnormalities of gait and mobility: Secondary | ICD-10-CM | POA: Diagnosis not present

## 2023-07-27 DIAGNOSIS — S2239XA Fracture of one rib, unspecified side, initial encounter for closed fracture: Principal | ICD-10-CM

## 2023-07-27 DIAGNOSIS — R932 Abnormal findings on diagnostic imaging of liver and biliary tract: Secondary | ICD-10-CM | POA: Diagnosis not present

## 2023-07-27 DIAGNOSIS — S299XXA Unspecified injury of thorax, initial encounter: Secondary | ICD-10-CM | POA: Diagnosis not present

## 2023-07-27 DIAGNOSIS — S22029A Unspecified fracture of second thoracic vertebra, initial encounter for closed fracture: Secondary | ICD-10-CM | POA: Diagnosis not present

## 2023-07-27 DIAGNOSIS — S2020XA Contusion of thorax, unspecified, initial encounter: Secondary | ICD-10-CM | POA: Diagnosis not present

## 2023-07-27 DIAGNOSIS — R0789 Other chest pain: Secondary | ICD-10-CM | POA: Diagnosis not present

## 2023-07-27 DIAGNOSIS — R0989 Other specified symptoms and signs involving the circulatory and respiratory systems: Secondary | ICD-10-CM | POA: Diagnosis not present

## 2023-07-27 DIAGNOSIS — S3993XA Unspecified injury of pelvis, initial encounter: Secondary | ICD-10-CM | POA: Diagnosis not present

## 2023-07-27 DIAGNOSIS — S22009A Unspecified fracture of unspecified thoracic vertebra, initial encounter for closed fracture: Secondary | ICD-10-CM | POA: Diagnosis present

## 2023-07-27 DIAGNOSIS — M4802 Spinal stenosis, cervical region: Secondary | ICD-10-CM | POA: Diagnosis not present

## 2023-07-27 DIAGNOSIS — S0990XA Unspecified injury of head, initial encounter: Secondary | ICD-10-CM | POA: Diagnosis not present

## 2023-07-27 DIAGNOSIS — R079 Chest pain, unspecified: Secondary | ICD-10-CM | POA: Diagnosis not present

## 2023-07-27 DIAGNOSIS — E041 Nontoxic single thyroid nodule: Secondary | ICD-10-CM | POA: Diagnosis not present

## 2023-07-27 LAB — CBC WITH DIFFERENTIAL/PLATELET
Abs Immature Granulocytes: 0.05 10*3/uL (ref 0.00–0.07)
Basophils Absolute: 0 10*3/uL (ref 0.0–0.1)
Basophils Relative: 0 %
Eosinophils Absolute: 0 10*3/uL (ref 0.0–0.5)
Eosinophils Relative: 0 %
HCT: 39.1 % (ref 36.0–46.0)
Hemoglobin: 12.9 g/dL (ref 12.0–15.0)
Immature Granulocytes: 1 %
Lymphocytes Relative: 8 %
Lymphs Abs: 0.8 10*3/uL (ref 0.7–4.0)
MCH: 29.6 pg (ref 26.0–34.0)
MCHC: 33 g/dL (ref 30.0–36.0)
MCV: 89.7 fL (ref 80.0–100.0)
Monocytes Absolute: 0.6 10*3/uL (ref 0.1–1.0)
Monocytes Relative: 5 %
Neutro Abs: 9.3 10*3/uL — ABNORMAL HIGH (ref 1.7–7.7)
Neutrophils Relative %: 86 %
Platelets: 217 10*3/uL (ref 150–400)
RBC: 4.36 MIL/uL (ref 3.87–5.11)
RDW: 13.1 % (ref 11.5–15.5)
WBC: 10.8 10*3/uL — ABNORMAL HIGH (ref 4.0–10.5)
nRBC: 0 % (ref 0.0–0.2)

## 2023-07-27 LAB — I-STAT CHEM 8, ED
BUN: 20 mg/dL (ref 8–23)
Calcium, Ion: 1.19 mmol/L (ref 1.15–1.40)
Chloride: 103 mmol/L (ref 98–111)
Creatinine, Ser: 0.9 mg/dL (ref 0.44–1.00)
Glucose, Bld: 122 mg/dL — ABNORMAL HIGH (ref 70–99)
HCT: 45 % (ref 36.0–46.0)
Hemoglobin: 15.3 g/dL — ABNORMAL HIGH (ref 12.0–15.0)
Potassium: 3.8 mmol/L (ref 3.5–5.1)
Sodium: 141 mmol/L (ref 135–145)
TCO2: 26 mmol/L (ref 22–32)

## 2023-07-27 LAB — COMPREHENSIVE METABOLIC PANEL WITH GFR
ALT: 23 U/L (ref 0–44)
AST: 35 U/L (ref 15–41)
Albumin: 3.4 g/dL — ABNORMAL LOW (ref 3.5–5.0)
Alkaline Phosphatase: 36 U/L — ABNORMAL LOW (ref 38–126)
Anion gap: 12 (ref 5–15)
BUN: 19 mg/dL (ref 8–23)
CO2: 25 mmol/L (ref 22–32)
Calcium: 9.2 mg/dL (ref 8.9–10.3)
Chloride: 102 mmol/L (ref 98–111)
Creatinine, Ser: 0.84 mg/dL (ref 0.44–1.00)
GFR, Estimated: >60 mL/min (ref >60)
Glucose, Bld: 120 mg/dL — ABNORMAL HIGH (ref 70–99)
Potassium: 3.7 mmol/L (ref 3.5–5.1)
Sodium: 139 mmol/L (ref 135–145)
Total Bilirubin: 0.2 mg/dL (ref 0.0–1.2)
Total Protein: 6.8 g/dL (ref 6.5–8.1)

## 2023-07-27 MED ORDER — MORPHINE SULFATE (PF) 4 MG/ML IV SOLN
4.0000 mg | Freq: Once | INTRAVENOUS | Status: AC
  Administered 2023-07-27: 4 mg via INTRAVENOUS
  Filled 2023-07-27: qty 1

## 2023-07-27 MED ORDER — IOHEXOL 350 MG/ML SOLN
75.0000 mL | Freq: Once | INTRAVENOUS | Status: AC | PRN
  Administered 2023-07-27: 75 mL via INTRAVENOUS

## 2023-07-27 MED ORDER — ONDANSETRON HCL 4 MG/2ML IJ SOLN
4.0000 mg | Freq: Once | INTRAMUSCULAR | Status: AC
  Administered 2023-07-27: 4 mg via INTRAVENOUS
  Filled 2023-07-27: qty 2
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
## 2023-07-27 NOTE — Progress Notes (Signed)
 Chaplain stepped into pt's room for a short visit as she was receiving care.  Pt asked for prayers.  Chaplain prayed softly at bedside while care for her continued.  Vernell Morgans Chaplain
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
## 2023-07-28 DIAGNOSIS — S22028A Other fracture of second thoracic vertebra, initial encounter for closed fracture: Secondary | ICD-10-CM | POA: Diagnosis not present

## 2023-07-28 DIAGNOSIS — S22009A Unspecified fracture of unspecified thoracic vertebra, initial encounter for closed fracture: Secondary | ICD-10-CM | POA: Diagnosis present

## 2023-07-28 DIAGNOSIS — S2232XA Fracture of one rib, left side, initial encounter for closed fracture: Secondary | ICD-10-CM | POA: Diagnosis not present

## 2023-07-28 LAB — CBC
HCT: 37.9 % (ref 36.0–46.0)
Hemoglobin: 12.3 g/dL (ref 12.0–15.0)
MCH: 29 pg (ref 26.0–34.0)
MCHC: 32.5 g/dL (ref 30.0–36.0)
MCV: 89.4 fL (ref 80.0–100.0)
Platelets: 213 10*3/uL (ref 150–400)
RBC: 4.24 MIL/uL (ref 3.87–5.11)
RDW: 13.2 % (ref 11.5–15.5)
WBC: 7 10*3/uL (ref 4.0–10.5)
nRBC: 0 % (ref 0.0–0.2)

## 2023-07-28 LAB — BASIC METABOLIC PANEL WITH GFR
Anion gap: 7 (ref 5–15)
BUN: 19 mg/dL (ref 8–23)
CO2: 26 mmol/L (ref 22–32)
Calcium: 8.9 mg/dL (ref 8.9–10.3)
Chloride: 107 mmol/L (ref 98–111)
Creatinine, Ser: 0.87 mg/dL (ref 0.44–1.00)
GFR, Estimated: >60 mL/min (ref >60)
Glucose, Bld: 101 mg/dL — ABNORMAL HIGH (ref 70–99)
Potassium: 4 mmol/L (ref 3.5–5.1)
Sodium: 140 mmol/L (ref 135–145)

## 2023-07-28 MED ORDER — MORPHINE SULFATE (PF) 2 MG/ML IV SOLN: 2.0000 mg | INTRAVENOUS | Status: DC | PRN

## 2023-07-28 MED ORDER — KETOROLAC TROMETHAMINE 15 MG/ML IJ SOLN
15.0000 mg | Freq: Four times a day (QID) | INTRAMUSCULAR | Status: DC
  Administered 2023-07-28 – 2023-07-29 (×7): 15 mg via INTRAVENOUS
  Filled 2023-07-28 (×7): qty 1

## 2023-07-28 MED ORDER — LIDOCAINE 5 % EX PTCH
2.0000 | MEDICATED_PATCH | CUTANEOUS | Status: DC
  Administered 2023-07-28: 2 via TRANSDERMAL
  Filled 2023-07-28: qty 2

## 2023-07-28 MED ORDER — DOCUSATE SODIUM 100 MG PO CAPS
100.0000 mg | ORAL_CAPSULE | Freq: Two times a day (BID) | ORAL | Status: DC
  Administered 2023-07-28 – 2023-07-29 (×3): 100 mg via ORAL
  Filled 2023-07-28 (×3): qty 1

## 2023-07-28 MED ORDER — ONDANSETRON 4 MG PO TBDP: 4.0000 mg | ORAL_TABLET | Freq: Four times a day (QID) | ORAL | Status: DC | PRN

## 2023-07-28 MED ORDER — METHOCARBAMOL 500 MG PO TABS
1000.0000 mg | ORAL_TABLET | Freq: Three times a day (TID) | ORAL | Status: DC
  Administered 2023-07-28 – 2023-07-29 (×6): 1000 mg via ORAL
  Filled 2023-07-28 (×6): qty 2

## 2023-07-28 MED ORDER — METHOCARBAMOL 1000 MG/10ML IJ SOLN
1000.0000 mg | Freq: Three times a day (TID) | INTRAMUSCULAR | Status: DC
  Filled 2023-07-28: qty 10

## 2023-07-28 MED ORDER — POLYETHYLENE GLYCOL 3350 17 G PO PACK: 17.0000 g | PACK | Freq: Every day | ORAL | Status: DC | PRN

## 2023-07-28 MED ORDER — ACETAMINOPHEN 500 MG PO TABS
1000.0000 mg | ORAL_TABLET | Freq: Four times a day (QID) | ORAL | Status: DC
  Administered 2023-07-28 – 2023-07-29 (×6): 1000 mg via ORAL
  Filled 2023-07-28 (×7): qty 2

## 2023-07-28 MED ORDER — ONDANSETRON HCL 4 MG/2ML IJ SOLN: 4.0000 mg | Freq: Four times a day (QID) | INTRAMUSCULAR | Status: DC | PRN

## 2023-07-28 MED ORDER — TRAMADOL HCL 50 MG PO TABS
25.0000 mg | ORAL_TABLET | Freq: Four times a day (QID) | ORAL | Status: DC | PRN
  Administered 2023-07-28 (×2): 50 mg via ORAL
  Filled 2023-07-28 (×2): qty 1

## 2023-07-28 MED ORDER — METOPROLOL TARTRATE 5 MG/5ML IV SOLN: 5.0000 mg | Freq: Four times a day (QID) | INTRAVENOUS | Status: DC | PRN

## 2023-07-28 MED ORDER — ENOXAPARIN SODIUM 30 MG/0.3ML IJ SOSY
30.0000 mg | PREFILLED_SYRINGE | Freq: Two times a day (BID) | INTRAMUSCULAR | Status: DC
  Administered 2023-07-29: 30 mg via SUBCUTANEOUS
  Filled 2023-07-28: qty 0.3

## 2023-07-28 MED ORDER — HYDRALAZINE HCL 20 MG/ML IJ SOLN: 10.0000 mg | INTRAMUSCULAR | Status: DC | PRN
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
## 2023-07-28 NOTE — Progress Notes (Signed)
 Transition of Care [facility]) - CAGE-AID Screening   Patient Details  Name: Lisa Miles MRN: [id number] Date of Birth: 08/07/1950  [facility]) CM/SW Contact:    Katha Hamming, RN Phone Number: 07/28/2023, 12:35 AM   Clinical Narrative:  Denies drug and alcohol abuse, no resources indicated.  CAGE-AID Screening:    Have You Ever Felt You Ought to Cut Down on Your Drinking or Drug Use?: No Have People Annoyed You By Critizing Your Drinking Or Drug Use?: No Have You Felt Bad Or Guilty About Your Drinking Or Drug Use?: No Have You Ever Had a Drink or Used Drugs First Thing In The Morning to Steady Your Nerves or to Get Rid of a Hangover?: No CAGE-AID Score: 0  Substance Abuse Education Offered: No
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
## 2023-07-28 NOTE — Plan of Care (Signed)

## 2023-07-29 ENCOUNTER — Observation Stay (HOSPITAL_COMMUNITY)

## 2023-07-29 DIAGNOSIS — E041 Nontoxic single thyroid nodule: Secondary | ICD-10-CM | POA: Diagnosis not present

## 2023-07-29 DIAGNOSIS — S22028A Other fracture of second thoracic vertebra, initial encounter for closed fracture: Secondary | ICD-10-CM | POA: Diagnosis not present

## 2023-07-29 DIAGNOSIS — S2232XA Fracture of one rib, left side, initial encounter for closed fracture: Secondary | ICD-10-CM | POA: Diagnosis not present

## 2023-07-29 DIAGNOSIS — S199XXA Unspecified injury of neck, initial encounter: Secondary | ICD-10-CM | POA: Diagnosis not present

## 2023-07-29 DIAGNOSIS — I6529 Occlusion and stenosis of unspecified carotid artery: Secondary | ICD-10-CM | POA: Diagnosis not present

## 2023-07-29 MED ORDER — OXYCODONE HCL 5 MG PO TABS
2.5000 mg | ORAL_TABLET | ORAL | Status: DC | PRN
  Administered 2023-07-29 (×2): 5 mg via ORAL
  Filled 2023-07-29 (×2): qty 1

## 2023-07-29 MED ORDER — OXYCODONE HCL 5 MG PO TABS: 2.5000 mg | ORAL_TABLET | ORAL | 0 refills | Status: DC | PRN

## 2023-07-29 MED ORDER — IPRATROPIUM-ALBUTEROL 0.5-2.5 (3) MG/3ML IN SOLN: 3.0000 mL | Freq: Four times a day (QID) | RESPIRATORY_TRACT | Status: DC | PRN

## 2023-07-29 MED ORDER — POLYETHYLENE GLYCOL 3350 17 G PO PACK
17.0000 g | PACK | Freq: Every day | ORAL | Status: DC
  Administered 2023-07-29: 17 g via ORAL
  Filled 2023-07-29: qty 1

## 2023-07-29 MED ORDER — METHOCARBAMOL 500 MG PO TABS: 1000.0000 mg | ORAL_TABLET | Freq: Three times a day (TID) | ORAL | 0 refills | Status: AC | PRN

## 2023-07-29 MED ORDER — ACETAMINOPHEN 500 MG PO TABS: 1000.0000 mg | ORAL_TABLET | Freq: Four times a day (QID) | ORAL | Status: AC | PRN

## 2023-07-29 MED ORDER — LIDOCAINE 5 % EX PTCH: 2.0000 | MEDICATED_PATCH | CUTANEOUS | Status: AC

## 2023-07-29 MED ORDER — IOHEXOL 350 MG/ML SOLN
75.0000 mL | Freq: Once | INTRAVENOUS | Status: AC | PRN
  Administered 2023-07-29: 75 mL via INTRAVENOUS

## 2023-07-29 NOTE — Progress Notes (Signed)
 Mobility Specialist Progress Note:    07/29/23 1200  Mobility  Activity Ambulated with assistance in hallway  Level of Assistance Contact guard assist, steadying assist  Assistive Device None  Distance Ambulated (ft) 250 ft  Activity Response Tolerated fair;Tolerated well  Mobility Referral Yes  Mobility visit 1 Mobility  Mobility Specialist Start Time (ACUTE ONLY) 1149  Mobility Specialist Stop Time (ACUTE ONLY) 1200  Mobility Specialist Time Calculation (min) (ACUTE ONLY) 11 min   Pt received in bed and agreeable. Required minG throughout d/t some swaying. Had x1 LOB, corrected w/ minA. Took x1 standing rest break. C/o some sternal pain and reminded of sternal precautions for comfort. Pt left in bed with call bell and all needs met.  D'Vante Earlene Plater Mobility Specialist Please contact via Special educational needs teacher or Rehab office at [phone]

## 2023-07-29 NOTE — TOC Transition Note (Signed)
 Transition of Care [facility]) - Discharge Note   Patient Details  Name: Lisa Miles MRN: [id number] Date of Birth: 05/14/1950  [facility]) CM/SW Contact:  Glennon Mac, RN Phone Number: 07/29/2023, 4:30 PM   Clinical Narrative:    Patient for possible discharge home today with spouse.  PT/OT recommending no OP therapy or DME.  No dc needs identified.    Final next level of care: Home/Self Care Barriers to Discharge: Barriers Resolved                       Discharge Plan and Services Additional resources added to the After Visit Summary for     Discharge Planning Services: CM Consult                                 Social Drivers of Health (SDOH) Interventions SDOH Screenings   Food Insecurity: No Food Insecurity (07/28/2023)  Housing: Low Risk  (07/28/2023)  Transportation Needs: No Transportation Needs (07/28/2023)  Utilities: Not At Risk (07/28/2023)  Alcohol Screen: Low Risk  (09/02/2022)  Depression (PHQ2-9): Low Risk  (07/13/2023)  Financial Resource Strain: Low Risk  (09/02/2022)  Physical Activity: Insufficiently Active (09/02/2022)  Social Connections: Socially Integrated (07/28/2023)  Stress: Stress Concern Present (09/02/2022)  Tobacco Use: Low Risk  (07/27/2023)     Readmission Risk Interventions     No data to display          Quintella Baton, RN, BSN  Trauma/Neuro ICU Case Manager [phone]

## 2023-07-29 NOTE — Plan of Care (Signed)
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
## 2023-07-29 NOTE — Care Management Obs Status (Signed)
 MEDICARE OBSERVATION STATUS NOTIFICATION   Patient Details  Name: Lisa Miles MRN: [id number] Date of Birth: June 11, 1950   Medicare Observation Status Notification Given:  Yes    Lockie Pares, RN 07/29/2023, 10:07 AM

## 2023-07-31 ENCOUNTER — Other Ambulatory Visit: Payer: Self-pay | Admitting: Cardiology

## 2023-08-09 ENCOUNTER — Ambulatory Visit: Payer: Self-pay
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
## 2023-08-10 ENCOUNTER — Encounter: Payer: Self-pay | Admitting: Emergency Medicine

## 2023-08-10 ENCOUNTER — Ambulatory Visit (INDEPENDENT_AMBULATORY_CARE_PROVIDER_SITE_OTHER): Admitting: Emergency Medicine

## 2023-08-10 VITALS — BP 124/70 | HR 58 | Ht 65.0 in | Wt 197.4 lb

## 2023-08-10 DIAGNOSIS — I350 Nonrheumatic aortic (valve) stenosis: Secondary | ICD-10-CM | POA: Diagnosis not present

## 2023-08-10 DIAGNOSIS — S22028D Other fracture of second thoracic vertebra, subsequent encounter for fracture with routine healing: Secondary | ICD-10-CM | POA: Diagnosis not present

## 2023-08-10 DIAGNOSIS — G40909 Epilepsy, unspecified, not intractable, without status epilepticus: Secondary | ICD-10-CM

## 2023-08-10 DIAGNOSIS — I10 Essential (primary) hypertension: Secondary | ICD-10-CM

## 2023-08-10 DIAGNOSIS — I340 Nonrheumatic mitral (valve) insufficiency: Secondary | ICD-10-CM | POA: Diagnosis not present

## 2023-08-10 DIAGNOSIS — S2239XA Fracture of one rib, unspecified side, initial encounter for closed fracture: Secondary | ICD-10-CM | POA: Insufficient documentation

## 2023-08-10 DIAGNOSIS — Z09 Encounter for follow-up examination after completed treatment for conditions other than malignant neoplasm: Secondary | ICD-10-CM

## 2023-08-10 DIAGNOSIS — T07XXXA Unspecified multiple injuries, initial encounter: Secondary | ICD-10-CM | POA: Diagnosis not present

## 2023-08-10 NOTE — Assessment & Plan Note (Signed)
 Recent echo downgraded to mild regurgitation, no intervention needed. - Continue annual echocardiogram.

## 2023-08-10 NOTE — Assessment & Plan Note (Signed)
 Recently started on Keppra 500 mg twice a day by neurology service  Tolerating medication well Probably contributing to occasional headaches
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
## 2023-08-10 NOTE — Assessment & Plan Note (Signed)
 Clinically stable and slowly healing well Right breast mass appeared after injury and findings are compatible with slowly resolving hematoma No other significant injuries but also complaining of some pain to left hip area Ambulatory without a limp No other concerns.

## 2023-08-10 NOTE — Assessment & Plan Note (Signed)
 Echocardiogram shows worsening stenosis-now read as moderate, not requiring intervention. Asymptomatic, stable condition. - Continue annual echocardiogram. - Educated on symptoms: shortness of breath, chest pain, palpitations.

## 2023-08-10 NOTE — Assessment & Plan Note (Signed)
 Endplate fracture Scheduled for follow-up with orthopedist next week Clinically stable.  No concerns Pain management discussed

## 2023-08-10 NOTE — Assessment & Plan Note (Signed)
 BP Readings from Last 3 Encounters:  08/10/23 124/70  07/29/23 (!) 144/70  07/20/23 124/70  Blood pressure well-controlled on chlorthalidone and toprol. - Continue chlorthalidone 12.5 mg daily and Toprol-XL 25 mg daily.

## 2023-08-10 NOTE — Assessment & Plan Note (Signed)
 No vascular injuries as per CT scan Healing well.  Still has some mild residual pain No complications and no concerns at present time

## 2023-08-10 NOTE — Patient Instructions (Signed)
Contusion A contusion is a deep bruise. This is a result of an injury that causes bleeding under the skin. Symptoms of bruising include pain, swelling, and discolored skin. The skin may turn blue, purple, or yellow. Follow these instructions at home: Managing pain, stiffness, and swelling You may use RICE. This stands for: Resting. Icing. Compression, or putting pressure on the injured area. Elevating, or raising the injured area. To follow this method, do these actions: Rest the injured area. If told, put ice on the injured area. To do this: Put ice in a plastic bag. Place a towel between your skin and the bag. Leave the ice on for 20 minutes, 2-3 times per day. If your skin turns bright red, take off the ice right away to prevent skin damage. The risk of skin damage is higher if you cannot feel pain, heat, or cold. If told, apply compression on the injured area using an elastic bandage. Make sure the bandage is not too tight. If the area tingles or has a loss of feeling (numbness), remove it and put it back on as told by your doctor. If possible, elevate the injured area above the level of your heart while you are sitting or lying down.  General instructions Take over-the-counter and prescription medicines only as told by your doctor. Keep all follow-up visits. Your doctor may want to see how your contusion is healing with treatment. Contact a doctor if: Your symptoms do not get better after several days of treatment. Your symptoms get worse. You have trouble moving the injured area. Get help right away if: You have very bad pain. You have a loss of feeling (numbness) in a hand or foot. Your hand or foot turns pale or cold. This information is not intended to replace advice given to you by your health care provider. Make sure you discuss any questions you have with your health care provider. Document Revised: 09/29/2021 Document Reviewed: 09/29/2021 Elsevier Patient Education  2024  [employer].

## 2023-08-15 ENCOUNTER — Other Ambulatory Visit: Payer: Self-pay | Admitting: Gastroenterology

## 2023-08-18 DIAGNOSIS — Z6832 Body mass index (BMI) 32.0-32.9, adult: Secondary | ICD-10-CM | POA: Diagnosis not present

## 2023-08-18 DIAGNOSIS — S22028A Other fracture of second thoracic vertebra, initial encounter for closed fracture: Secondary | ICD-10-CM | POA: Diagnosis not present

## 2023-08-26 ENCOUNTER — Ambulatory Visit: Admitting: Emergency Medicine

## 2023-09-06 ENCOUNTER — Ambulatory Visit (INDEPENDENT_AMBULATORY_CARE_PROVIDER_SITE_OTHER): Payer: PPO

## 2023-09-06 VITALS — Ht 65.0 in | Wt 195.0 lb

## 2023-09-06 DIAGNOSIS — Z1231 Encounter for screening mammogram for malignant neoplasm of breast: Secondary | ICD-10-CM | POA: Diagnosis not present

## 2023-09-06 DIAGNOSIS — Z0000 Encounter for general adult medical examination without abnormal findings: Secondary | ICD-10-CM

## 2023-09-06 NOTE — Patient Instructions (Addendum)
 Lisa Miles , Thank you for taking time out of your busy schedule to complete your Annual Wellness Visit with me. I enjoyed our conversation and look forward to speaking with you again next year. I, as well as your care team,  appreciate your ongoing commitment to your health goals. Please review the following plan we discussed and let me know if I can assist you in the future. Your Game plan/ To Do List    Referrals: If you haven't heard from the office you've been referred to, please reach out to them at the phone provided.  Referral for a Screening Mammogram.   Follow up Visits: Next Medicare AWV with our clinical staff: 09/06/2024   Have you seen your provider in the last 6 months (3 months if uncontrolled diabetes)? Yes Next Office Visit with your provider: 12/2023  Clinician Recommendations:  Aim for 30 minutes of exercise or brisk walking, 6-8 glasses of water, and 5 servings of fruits and vegetables each day. Educated and advised on getting the COVID, Shingles, and Tdap (Tetenus) vaccines in 2025 at local pharmacy.      This is a list of the screening recommended for you and due dates:  Health Maintenance  Topic Date Due   COVID-19 Vaccine (6 - 2024-25 season) 12/27/2022   DTaP/Tdap/Td vaccine (2 - Td or Tdap) 03/29/2023   Zoster (Shingles) Vaccine (1 of 2) 10/13/2023*   Mammogram  10/06/2023   Flu Shot  11/26/2023   Medicare Annual Wellness Visit  09/05/2024   Colon Cancer Screening  02/19/2032   Pneumonia Vaccine  Completed   DEXA scan (bone density measurement)  Completed   Hepatitis C Screening  Completed   HPV Vaccine  Aged Out   Meningitis B Vaccine  Aged Out  *Topic was postponed. The date shown is not the original due date.    Advanced directives: (Copy Requested) Please bring a copy of your health care power of attorney and living will to the office to be added to your chart at your convenience. You can mail to [facility] [address] [place],  [place] [address] or email to [email or web address] Advance Care Planning is important because it:  [x]  Makes sure you receive the medical care that is consistent with your values, goals, and preferences  [x]  It provides guidance to your family and loved ones and reduces their decisional burden about whether or not they are making the right decisions based on your wishes.  Follow the link provided in your after visit summary or read over the paperwork we have mailed to you to help you started getting your Advance Directives in place. If you need assistance in completing these, please reach out to us  so that we can help you!   Managing Pain Without Opioids Opioids are strong medicines used to treat moderate to severe pain. For some people, especially those who have long-term (chronic) pain, opioids may not be the best choice for pain management due to: Side effects like nausea, constipation, and sleepiness. The risk of addiction (opioid use disorder). The longer you take opioids, the greater your risk of addiction. Pain that lasts for more than 3 months is called chronic pain. Managing chronic pain usually requires more than one approach and is often provided by a team of health care providers working together (multidisciplinary approach). Pain management may be done at a pain management center or pain clinic. How to manage pain without the use of opioids Use non-opioid medicines Non-opioid medicines  for pain may include: Over-the-counter or prescription non-steroidal anti-inflammatory drugs (NSAIDs). These may be the first medicines used for pain. They work well for muscle and bone pain, and they reduce swelling. Acetaminophen . This over-the-counter medicine may work well for milder pain but not swelling. Antidepressants. These may be used to treat chronic pain. A certain type of antidepressant (tricyclics) is often used. These medicines are given in lower doses for pain than when used for  depression. Anticonvulsants. These are usually used to treat seizures but may also reduce nerve (neuropathic) pain. Muscle relaxants. These relieve pain caused by sudden muscle tightening (spasms). You may also use a pain medicine that is applied to the skin as a patch, cream, or gel (topical analgesic), such as a numbing medicine. These may cause fewer side effects than medicines taken by mouth. Do certain therapies as directed Some therapies can help with pain management. They include: Physical therapy. You will do exercises to gain strength and flexibility. A physical therapist may teach you exercises to move and stretch parts of your body that are weak, stiff, or painful. You can learn these exercises at physical therapy visits and practice them at home. Physical therapy may also involve: Massage. Heat wraps or applying heat or cold to affected areas. Electrical signals that interrupt pain signals (transcutaneous electrical nerve stimulation, TENS). Weak lasers that reduce pain and swelling (low-level laser therapy). Signals from your body that help you learn to regulate pain (biofeedback). Occupational therapy. This helps you to learn ways to function at home and work with less pain. Recreational therapy. This involves trying new activities or hobbies, such as a physical activity or drawing. Mental health therapy, including: Cognitive behavioral therapy (CBT). This helps you learn coping skills for dealing with pain. Acceptance and commitment therapy (ACT) to change the way you think and react to pain. Relaxation therapies, including muscle relaxation exercises and mindfulness-based stress reduction. Pain management counseling. This may be individual, family, or group counseling.  Receive medical treatments Medical treatments for pain management include: Nerve block injections. These may include a pain blocker and anti-inflammatory medicines. You may have injections: Near the spine to  relieve chronic back or neck pain. Into joints to relieve back or joint pain. Into nerve areas that supply a painful area to relieve body pain. Into muscles (trigger point injections) to relieve some painful muscle conditions. A medical device placed near your spine to help block pain signals and relieve nerve pain or chronic back pain (spinal cord stimulation device). Acupuncture. Follow these instructions at home Medicines Take over-the-counter and prescription medicines only as told by your health care provider. If you are taking pain medicine, ask your health care providers about possible side effects to watch out for. Do not drive or use heavy machinery while taking prescription opioid pain medicine. Lifestyle  Do not use drugs or alcohol  to reduce pain. If you drink alcohol , limit how much you have to: 0-1 drink a day for women who are not pregnant. 0-2 drinks a day for men. Know how much alcohol  is in a drink. In the [place] one drink equals one 12 oz bottle of beer (355 mL), one 5 oz glass of wine (148 mL), or one 1 oz glass of hard liquor (44 mL). Do not use any products that contain nicotine or tobacco. These products include cigarettes, chewing tobacco, and vaping devices, such as e-cigarettes. If you need help quitting, ask your health care provider. Eat a healthy diet and maintain a healthy weight. Poor  diet and excess weight may make pain worse. Eat foods that are high in fiber. These include fresh fruits and vegetables, whole grains, and beans. Limit foods that are high in fat and processed sugars, such as fried and sweet foods. Exercise regularly. Exercise lowers stress and may help relieve pain. Ask your health care provider what activities and exercises are safe for you. If your health care provider approves, join an exercise class that combines movement and stress reduction. Examples include yoga and tai chi. Get enough sleep. Lack of sleep may make pain worse. Lower stress  as much as possible. Practice stress reduction techniques as told by your therapist. General instructions Work with all your pain management providers to find the treatments that work best for you. You are an important member of your pain management team. There are many things you can do to reduce pain on your own. Consider joining an online or in[place] support group for people who have chronic pain. Keep all follow-up visits. This is important. Where to find more information You can find more information about managing pain without opioids from: American Academy of Pain Medicine: painmed.org Institute for Chronic Pain: instituteforchronicpain.org American Chronic Pain Association: theacpa.org Contact a health care provider if: You have side effects from pain medicine. Your pain gets worse or does not get better with treatments or home therapy. You are struggling with anxiety or depression. Summary Many types of pain can be managed without opioids. Chronic pain may respond better to pain management without opioids. Pain is best managed when you and a team of health care providers work together. Pain management without opioids may include non-opioid medicines, medical treatments, physical therapy, mental health therapy, and lifestyle changes. Tell your health care providers if your pain gets worse or is not being managed well enough. This information is not intended to replace advice given to you by your health care provider. Make sure you discuss any questions you have with your health care provider. Document Revised: 07/24/2020 Document Reviewed: 07/24/2020 Elsevier Patient Education  2024 [employer].

## 2023-09-06 NOTE — Progress Notes (Signed)
 Subjective:   Lisa Miles is a 73 y.o. who presents for a Medicare Wellness preventive visit.  As a reminder, Annual Wellness Visits don't include a physical exam, and some assessments may be limited, especially if this visit is performed virtually. We may recommend an in-[place] visit if needed.  Visit Complete: Virtual I connected with  Lisa Miles on 09/06/23 by a audio enabled telemedicine application and verified that I am speaking with the correct [place] using two identifiers.  Patient Location: Home  Provider Location: Office/Clinic  I discussed the limitations of evaluation and management by telemedicine. The patient expressed understanding and agreed to proceed.  Vital Signs: Because this visit was a virtual/telehealth visit, some criteria may be missing or patient reported. Any vitals not documented were not able to be obtained and vitals that have been documented are patient reported.  VideoDeclined- This patient declined Librarian, academic. Therefore the visit was completed with audio only.  Persons Participating in Visit: Patient.  AWV Questionnaire: No: Patient Medicare AWV questionnaire was not completed prior to this visit.  Cardiac Risk Factors include: advanced age (>69men, >73 women);hypertension;dyslipidemia     Objective:     Today's Vitals   09/06/23 1116  Weight: 195 lb (88.5 kg)  Height: 5\' 5"  (1.651 m)   Body mass index is 32.45 kg/m.     09/06/2023   11:14 AM 07/28/2023    8:31 AM 07/27/2023    5:19 PM 09/02/2022   11:21 AM 09/12/2021    2:12 PM 03/28/2020    6:02 AM 03/13/2020    8:19 AM  Advanced Directives  Does Patient Have a Medical Advance Directive? Yes  No Yes Yes Yes Yes  Type of Estate agent of [place];Living will   Healthcare Power of [place];Living will Healthcare Power of [place];Living will Living will Living will  Does patient want to make changes to medical advance  directive?      No - Patient declined   Copy of Healthcare Power of Attorney in Chart? No - copy requested   No - copy requested No - copy requested    Would patient like information on creating a medical advance directive?  No - Patient declined         Current Medications (verified) Outpatient Encounter Medications as of 09/06/2023  Medication Sig   acetaminophen  (TYLENOL ) 500 MG tablet Take 2 tablets (1,000 mg total) by mouth every 6 (six) hours as needed.   amoxicillin  (AMOXIL ) 500 MG tablet Take 500 mg by mouth. Take 4 tablets before dental procedures.   Apoaequorin (PREVAGEN EXTRA STRENGTH) 20 MG CAPS Take 1 capsule by mouth daily.   aspirin  EC 81 MG tablet Take 81 mg by mouth daily. Swallow whole.   Calcium  Carb-Cholecalciferol (CALCIUM  + VITAMIN D3 PO) Take 600-800 mg by mouth in the morning.   chlorthalidone  (HYGROTON ) 25 MG tablet TAKE 1/2 TABLET BY MOUTH DAILY   Cholecalciferol (VITAMIN D ) 50 MCG (2000 UT) CAPS Take 2,000 Units by mouth daily.   esomeprazole  (NEXIUM ) 20 MG capsule TAKE 1 CAPSULE(20 MG) BY MOUTH TWICE DAILY BEFORE A MEAL   fluticasone  (FLONASE) 50 MCG/ACT nasal spray Place 1 spray into both nostrils daily.   hydroxypropyl methylcellulose / hypromellose (ISOPTO TEARS / GONIOVISC) 2.5 % ophthalmic solution Place 1 drop into both eyes in the morning and at bedtime.   ibuprofen  (ADVIL ) 200 MG tablet Take 600-800 mg by mouth as needed for mild pain (pain score 1-3) or headache.  [name]  (KEPPRA ) 500 MG tablet Take 1 tablet (500 mg total) by mouth 2 (two) times daily.   lidocaine  (LIDODERM ) 5 % Place 2 patches onto the skin daily. Remove & Discard patch within 12 hours or as directed by MD   methocarbamol  (ROBAXIN ) 500 MG tablet Take 2 tablets (1,000 mg total) by mouth every 8 (eight) hours as needed for muscle spasms.   metoprolol  succinate (TOPROL -XL) 25 MG 24 hr tablet TAKE 1 TABLET(25 MG) BY MOUTH DAILY   oxyCODONE  (OXY IR/ROXICODONE ) 5 MG immediate release  tablet Take 0.5-1 tablets (2.5-5 mg total) by mouth every 4 (four) hours as needed (pain).   rosuvastatin  (CRESTOR ) 10 MG tablet TAKE 1 TABLET(10 MG) BY MOUTH DAILY   valsartan  (DIOVAN ) 320 MG tablet TAKE 1 TABLET(320 MG) BY MOUTH DAILY   No facility-administered encounter medications on file as of 09/06/2023.    Allergies (verified) Patient has no known allergies.   History: Past Medical History:  Diagnosis Date   Aortic stenosis, mild-moderate 07/2012   TTE October 21: Moderate aortic valve thickening with mild to moderate AI & mild AS; TEE: mild to moderate AI & MIld AS   Arthritis    Phreesia 11/06/2019   Asthma    Bilateral bunions    Bunionectomies performed   Bronchitis, chronic (HCC)    Cataract    Phreesia 11/06/2019   Heart disease    Hyperlipidemia    Hypertension    Good control   Inguinodynia    Bilateral groin pain   Lower extremity edema    Chronic. Venous Duplex 11/06/11 SUMMARY: 1) Bilateral Lower Extremities: No evidence of DVT or thrombophlebitis.  2) Right Common Femoral Vein: Demonstrated mild valvular insufficiency with a greater than (1) sec of duration. Mildly abnormal LE Venous duplex Doppler.   Obesity    Palpitations    Relatively well controlled   Scoliosis    DG Chest 2 View x-ray on 07/30/11 by Dr. Rosalio Comas shows a scoliosis.   Severe mitral regurgitation by prior echocardiogram 07/2012   Mild AMVL prolapse, Mod MR -- no MVP noted in 01/2018 -> 11/'21: Severe MR due to restricted movement of P3 scallop of PMVL (IIIB) due to incomplete leaflet coaptation -> thickened leaflets consistent with Rheumatic Heart Valve Disease.   Past Surgical History:  Procedure Laterality Date   ABDOMINAL HYSTERECTOMY     ANKLE ARTHROSCOPY Left    BUNIONECTOMY Bilateral    CARDIAC EVENT MONITOR  03/2020   (04/05/2020 -05/04/2020): Mostly NSR.  Heart rate range 42-148 bpm.  3 short bursts of 4-5 beats NSVT (asymptomatic).  116 beat run of PAT.  Rare PACs and PVCs.  Some  bigeminy and trigeminy.   CARDIOPULMONARY EXERCISE TEST (CPX)  05/09/2020   (done with Ex St Echo): Normal Functional Capacity. No indication for CP limitations.  Body Habitus contributes to Exercise Intolerance.   EXERCISE STRESS ECHO  05/09/2020   TTE shows normal EF 65% with LVH No pericardial effusion Normal RV size and function Moderate appearing rheumatic MR/AR.;; -- NO COMMENT ON EXERCISE EFFECT ON MR!!! (even though this was the reason for ordering the test)   EYE SURGERY N/A    Phreesia 11/06/2019   IR RADIOLOGIST EVAL & MGMT  06/14/2023   KNEE ARTHROSCOPY Right    Left knee open surgery     pt thinks she only had shots in L knee, not surgery   RIGHT/LEFT HEART CATH AND CORONARY ANGIOGRAPHY N/A 03/28/2020   RIGHT/LEFT HEART CATH AND CORONARY ANGIOGRAPHY;  Arleen Lacer, MD;  Location: [facility] INVASIVE CV LAB; angiographically normal coronary arteries.  Cloacal LM. Normal RHC Pressures (PAP 40/13 - mean 22 mmHg, PCWP , V wave 25 mmHg c/w MR, LVEDP 15 mmHg); CO/CI 5.02 / 2.45 (reduced).   TEE WITHOUT CARDIOVERSION N/A 03/13/2020   Procedure: TRANSESOPHAGEAL ECHOCARDIOGRAM (TEE);  Surgeon: Harrold Lincoln, MD;  [facility] ENDOSCOPY;;; Severe MR 2/2 restricted P3 scallop of PMVL (IIIB) w/ incomplete leaflet coaptation.  Thickened /degenerative leaflets c/w Rheumatic Valvular Heart Disease.  2D PISA radius 0.9 cm with 2D ERO 0.26 cm2 with R Vol 62 cc. PV blunting w/ BP 109/60 mmHg. No MS. Mild-mod AI / Mild AS. Gr 2 plaque Aorta   TOTAL ABDOMINAL HYSTERECTOMY     TRANSTHORACIC ECHOCARDIOGRAM  05/09/2021   EF 60 to 65%.  No RWMA.  GR 1 DD with elevated LAP-moderately dilated left atrium (LA).  Normal RV size and function.  Moderate to severe MR with no MS.  Moderate aortic calcification/sclerosis with only mild stenosis.  Ascending aorta 39 mm.   TRANSTHORACIC ECHOCARDIOGRAM  01/2018   a) Normal LV size.  Mod Conc LVH.  EF 60-65%.  No R WMA.  GR 1 DD (high P).  Mild AS (MG 16 mmHg) w/ Mod AI.   Severe LA dilation.  Mild MR.; 01/2020:  EF 65-70%. No RWMA.  Severe MR w/ restricted PMVL movement.  (Recommend TEE).  Mod AoV thickening w/ mild to mod AS & Mod AI.  Nl RV fxn.  Mildly elevated filling P. Severe LA dilation.   Zio Patch Monitor  05/2021   Predominant Rhythm Is Sinus Rhythm - HR range 45 -139 bpm & avg 74 bpm.  Occ PACs (2.9%) w/ some couplets and triplets,& rare PVCs. 20 Atrial Runs: Fastest - 6 beats w/ rate 235 bpm; Longest 12 beats @ 138 bpm (~ 5.2 sec); Symptoms were noted with PACs and PVCs not with atrial tachycardia runs.   Family History  Problem Relation Age of Onset   Arthritis Mother    Hyperlipidemia Mother    Diabetes Mother    Gout Mother    Hypertension Mother    Dementia Mother    Cancer Father        GI cancer   Hypertension Brother    Diabetes Brother    Hypertension Brother    Diabetes Maternal Grandfather    Migraines Neg Hx    Headache Neg Hx    Social History   Socioeconomic History   Marital status: Married    Spouse name: Porfirio Bristol   Number of children: 2   Years of education: Not on file   Highest education level: Not on file  Occupational History   Occupation: production    Employer: Publishing rights manager    Comment: check printing/shipping  Tobacco Use   Smoking status: Never    Passive exposure: Never   Smokeless tobacco: Never  Vaping Use   Vaping status: Never Used  Substance and Sexual Activity   Alcohol  use: No   Drug use: No   Sexual activity: Yes  Other Topics Concern   Not on file  Social History Narrative   Lives with her husband.  Their children live nearby.   Right handed   Caffeine: 1 cup/day   Social Drivers of Corporate investment banker Strain: Low Risk  (09/06/2023)   Overall Financial Resource Strain (CARDIA)    Difficulty of Paying Living Expenses: Not hard at all  Food Insecurity: No Food Insecurity (09/06/2023)  Hunger Vital Sign    Worried About Running Out of Food in the Last Year: Never true    Ran Out  of Food in the Last Year: Never true  Transportation Needs: No Transportation Needs (09/06/2023)   PRAPARE - Administrator, Civil Service (Medical): No    Lack of Transportation (Non-Medical): No  Physical Activity: Inactive (09/06/2023)   Exercise Vital Sign    Days of Exercise per Week: 0 days    Minutes of Exercise per Session: 0 min  Stress: No Stress Concern Present (09/06/2023)   [employer] of Occupational Health - Occupational Stress Questionnaire    Feeling of Stress : Not at all  Social Connections: Socially Integrated (09/06/2023)   Social Connection and Isolation Panel [NHANES]    Frequency of Communication with Friends and Family: More than three times a week    Frequency of Social Gatherings with Friends and Family: More than three times a week    Attends Religious Services: More than 4 times per year    Active Member of [employer] or Organizations: Yes    Attends Engineer, structural: More than 4 times per year    Marital Status: Married    Tobacco Counseling Counseling given: No    Clinical Intake:  Pre-visit preparation completed: Yes  Pain : No/denies pain     BMI - recorded: 32.45 Nutritional Status: BMI > 30  Obese Nutritional Risks: None Diabetes: No  Lab Results  Component Value Date   HGBA1C 6.0 (H) 09/16/2022   HGBA1C 6.0 07/02/2021   HGBA1C 5.4 06/11/2020     How often do you need to have someone help you when you read instructions, pamphlets, or other written materials from your doctor or pharmacy?: 1 - Never  Interpreter Needed?: No  Information entered by :: Kandy Orris, CMA   Activities of Daily Living     09/06/2023   11:21 AM 07/28/2023    8:00 AM  In your present state of health, do you have any difficulty performing the following activities:  Hearing? 0 0  Vision? 0 0  Difficulty concentrating or making decisions? 0 0  Walking or climbing stairs? 0   Dressing or bathing? 0   Doing errands, shopping? 0    Preparing Food and eating ? N   Using the Toilet? N   In the past six months, have you accidently leaked urine? N   Do you have problems with loss of bowel control? N   Managing your Medications? N   Managing your Finances? N   Housekeeping or managing your Housekeeping? N     Patient Care Team: Elvira Hammersmith, MD as PCP - General (Internal Medicine) Arleen Lacer, MD as PCP - Cardiology (Cardiology) Arleen Lacer, MD as Consulting Physician (Cardiology) Leontine Rana, MD (Inactive) as Consulting Physician (Obstetrics and Gynecology)  Indicate any recent Medical Services you may have received from other than [facility] providers in the past year (date may be approximate).     Assessment:    This is a routine wellness examination for Lisa Miles.  Hearing/Vision screen Hearing Screening - Comments:: Denies hearing difficulties   Vision Screening - Comments:: Wears rx glasses - up to date with routine eye exams with an Opthalmologist   Goals Addressed               This Visit's Progress     Patient Stated (pt-stated)        Patient stated she plans  to restart exercising.       Depression Screen     09/06/2023   11:24 AM 07/13/2023   10:43 AM 03/22/2023    1:30 PM 01/06/2023    9:58 AM 09/02/2022   11:23 AM 07/07/2022   10:16 AM 09/12/2021    2:13 PM  PHQ 2/9 Scores  PHQ - 2 Score 0 0 0 0 0 0 0  PHQ- 9 Score 0    0      Fall Risk     09/06/2023   11:22 AM 07/13/2023   10:43 AM 03/22/2023    1:29 PM 01/06/2023    9:57 AM 09/02/2022   11:23 AM  Fall Risk   Falls in the past year? 0 0 0 0 0  Number falls in past yr: 0 0 0 0 0  Injury with Fall? 0 0 0 0 0  Risk for fall due to : No Fall Risks No Fall Risks No Fall Risks No Fall Risks No Fall Risks  Follow up Falls prevention discussed;Falls evaluation completed Falls evaluation completed Falls evaluation completed Falls evaluation completed Falls prevention discussed    MEDICARE RISK AT HOME:  Medicare Risk at  Home Any stairs in or around the home?: No If so, are there any without handrails?: No Home free of loose throw rugs in walkways, pet beds, electrical cords, etc?: Yes Adequate lighting in your home to reduce risk of falls?: Yes Life alert?: No Use of a cane, walker or w/c?: No Grab bars in the bathroom?: No Shower chair or bench in shower?: No Elevated toilet seat or a handicapped toilet?: Yes  TIMED UP AND GO:  Was the test performed?  No  Cognitive Function: 6CIT completed      09/16/2022   10:37 AM  Montreal Cognitive Assessment   Visuospatial/ Executive (0/5) 3  Naming (0/3) 3  Attention: Read list of digits (0/2) 0  Attention: Read list of letters (0/1) 1  Attention: Serial 7 subtraction starting at 100 (0/3) 0  Language: Repeat phrase (0/2) 2  Language : Fluency (0/1) 1  Abstraction (0/2) 1  Delayed Recall (0/5) 2  Orientation (0/6) 0  Total 13      09/06/2023   11:26 AM 09/02/2022   11:40 AM 11/08/2019   11:04 AM 11/07/2018    1:22 PM 11/07/2018    1:20 PM  6CIT Screen  What Year? 0 points 0 points 0 points 0 points 0 points  What month? 0 points 0 points 0 points 0 points 0 points  What time? 0 points 0 points 0 points 0 points 0 points  Count back from 20 0 points 0 points 0 points 0 points 0 points  Months in reverse 0 points 0 points 0 points 0 points 0 points  Repeat phrase 0 points 0 points 0 points 0 points   Total Score 0 points 0 points 0 points 0 points     Immunizations Immunization History  Administered Date(s) Administered   Fluad Quad(high Dose 65+) 01/06/2019, 02/20/2021, 07/07/2022   Fluad Trivalent(High Dose 65+) 03/15/2012   Influenza Whole 03/27/2009   Influenza, High Dose Seasonal PF 04/01/2018   Influenza,inj,Quad PF,6+ Mos 03/15/2012, 03/28/2013, 07/22/2016   Moderna Sars-Covid-2 Vaccination 06/10/2019, 07/08/2019, 02/27/2020, 09/02/2020   Pneumococcal Conjugate-13 07/22/2016   Pneumococcal Polysaccharide-23 04/02/2009, 09/22/2018    Tdap 03/28/2013   Unspecified SARS-COV-2 Vaccination 07/08/2019    Screening Tests Health Maintenance  Topic Date Due   COVID-19 Vaccine (6 - 2024-25 season) 12/27/2022  DTaP/Tdap/Td (2 - Td or Tdap) 03/29/2023   Zoster Vaccines- Shingrix (1 of 2) 10/13/2023 (Originally 01/22/2001)   MAMMOGRAM  10/06/2023   INFLUENZA VACCINE  11/26/2023   Medicare Annual Wellness (AWV)  09/05/2024   Colonoscopy  02/19/2032   Pneumonia Vaccine 64+ Years old  Completed   DEXA SCAN  Completed   Hepatitis C Screening  Completed   HPV VACCINES  Aged Out   Meningococcal B Vaccine  Aged Out    Health Maintenance  Health Maintenance Due  Topic Date Due   COVID-19 Vaccine (6 - 2024-25 season) 12/27/2022   DTaP/Tdap/Td (2 - Td or Tdap) 03/29/2023   Health Maintenance Items Addressed:  Mammogram ordered  Additional Screening:  Vision Screening: Recommended annual ophthalmology exams for early detection of glaucoma and other disorders of the eye.  Dental Screening: Recommended annual dental exams for proper oral hygiene  Community Resource Referral / Chronic Care Management: CRR required this visit?  No   CCM required this visit?  No   Plan:    I have personally reviewed and noted the following in the patient's chart:   Medical and social history Use of alcohol , tobacco or illicit drugs  Current medications and supplements including opioid prescriptions. Patient is currently taking opioid prescriptions. Information provided to patient regarding non-opioid alternatives. Patient advised to discuss non-opioid treatment plan with their provider. Functional ability and status Nutritional status Physical activity Advanced directives List of other physicians Hospitalizations, surgeries, and ER visits in previous 12 months Vitals Screenings to include cognitive, depression, and falls Referrals and appointments  In addition, I have reviewed and discussed with patient certain preventive  protocols, quality metrics, and best practice recommendations. A written personalized care plan for preventive services as well as general preventive health recommendations were provided to patient.   Patria Bookbinder, CMA   09/06/2023   After Visit Summary: (MyChart) Due to this being a telephonic visit, the after visit summary with patients personalized plan was offered to patient via MyChart   Notes: Nothing significant to report at this time.

## 2023-09-09 DIAGNOSIS — Z1272 Encounter for screening for malignant neoplasm of vagina: Secondary | ICD-10-CM | POA: Diagnosis not present

## 2023-09-09 DIAGNOSIS — Z1151 Encounter for screening for human papillomavirus (HPV): Secondary | ICD-10-CM | POA: Diagnosis not present

## 2023-09-09 DIAGNOSIS — Z124 Encounter for screening for malignant neoplasm of cervix: Secondary | ICD-10-CM | POA: Diagnosis not present

## 2023-09-09 DIAGNOSIS — Z6833 Body mass index (BMI) 33.0-33.9, adult: Secondary | ICD-10-CM | POA: Diagnosis not present

## 2023-09-22 ENCOUNTER — Encounter (HOSPITAL_COMMUNITY): Payer: Self-pay | Admitting: Emergency Medicine

## 2023-09-22 ENCOUNTER — Other Ambulatory Visit: Payer: Self-pay

## 2023-09-22 ENCOUNTER — Emergency Department (HOSPITAL_COMMUNITY)
Admission: EM | Admit: 2023-09-22 | Discharge: 2023-09-22 | Disposition: A | Discharge status: Home / Self Care | Attending: Emergency Medicine | Admitting: Emergency Medicine

## 2023-09-22 ENCOUNTER — Emergency Department (HOSPITAL_COMMUNITY)

## 2023-09-22 DIAGNOSIS — I10 Essential (primary) hypertension: Secondary | ICD-10-CM | POA: Insufficient documentation

## 2023-09-22 DIAGNOSIS — J45909 Unspecified asthma, uncomplicated: Secondary | ICD-10-CM | POA: Insufficient documentation

## 2023-09-22 DIAGNOSIS — M25512 Pain in left shoulder: Secondary | ICD-10-CM | POA: Diagnosis not present

## 2023-09-22 MED ORDER — SENNOSIDES-DOCUSATE SODIUM 8.6-50 MG PO TABS: 1.0000 | ORAL_TABLET | Freq: Every evening | ORAL | 0 refills | Status: AC | PRN

## 2023-09-22 MED ORDER — KETOROLAC TROMETHAMINE 30 MG/ML IJ SOLN
15.0000 mg | Freq: Once | INTRAMUSCULAR | Status: AC
  Administered 2023-09-22: 15 mg via INTRAVENOUS
  Filled 2023-09-22: qty 1

## 2023-09-22 MED ORDER — DICLOFENAC SODIUM 1 % EX GEL: 2.0000 g | Freq: Four times a day (QID) | CUTANEOUS | 0 refills | Status: AC

## 2023-09-22 MED ORDER — OXYCODONE HCL 5 MG PO TABS
5.0000 mg | ORAL_TABLET | Freq: Once | ORAL | Status: AC
  Administered 2023-09-22: 5 mg via ORAL
  Filled 2023-09-22: qty 1

## 2023-09-22 MED ORDER — OXYCODONE HCL 5 MG PO TABS: 5.0000 mg | ORAL_TABLET | Freq: Four times a day (QID) | ORAL | 0 refills | Status: AC | PRN

## 2023-09-22 NOTE — Discharge Instructions (Signed)
 Please call the orthopedic doctor listed to schedule a follow up appointment. Oxycodone  will cause constipation and will impair your ability to drive. Take the constipation medication and do not take Oxycodone  if you will be driving.

## 2023-09-22 NOTE — ED Triage Notes (Signed)
 Pt c/o left shoulder pain since Monday, denies injury.
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
## 2023-09-28 ENCOUNTER — Ambulatory Visit
Admission: RE | Admit: 2023-09-28 | Discharge: 2023-09-28 | Disposition: A | Discharge status: Home / Self Care | Source: Ambulatory Visit | Attending: Interventional Radiology | Admitting: Interventional Radiology

## 2023-09-28 DIAGNOSIS — E041 Nontoxic single thyroid nodule: Secondary | ICD-10-CM

## 2023-09-28 DIAGNOSIS — E042 Nontoxic multinodular goiter: Secondary | ICD-10-CM | POA: Diagnosis not present

## 2023-09-29 ENCOUNTER — Encounter: Payer: Self-pay | Admitting: Orthopaedic Surgery

## 2023-09-29 ENCOUNTER — Ambulatory Visit (INDEPENDENT_AMBULATORY_CARE_PROVIDER_SITE_OTHER): Admitting: Orthopaedic Surgery

## 2023-09-29 DIAGNOSIS — G8929 Other chronic pain: Secondary | ICD-10-CM

## 2023-09-29 DIAGNOSIS — M25512 Pain in left shoulder: Secondary | ICD-10-CM
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
## 2023-10-01 NOTE — Progress Notes (Signed)
 This encounter was conducted via the [employer] providing interactive audio and visual communication.  The patient provided verbal consent to conduct a virtual appointment.  The patient was located at their primary residence during this encounter.  Referring Physician(s): Miles,Lisa S   Chief Complaint: The patient is seen in virtual video follow up today s/p left cystic nodule aspiration with ethanol ablation 07/19/23  History of present illness: HPI from initial consultation 06/14/23 Lisa Miles is a 73 y.o. female with a medical history significant for HTN and GERD. She was referred to Otolaryngology April 2023 for evaluation of constant throat clearing and nasal congestion. A prior endoscopy showed laryngeal erythema consistent with reflux.  Her consultation with Otolaryngology was unrevealing and she was started on fluticasone  and dietary changes. This helped somewhat but her symptoms never fully resolved.    She presented to the ED November 2024 with complaints of a sore throat and a bump in her left neck. CT imaging showed a 3.5 cm hypodense mass in the left thyroid  lobe causing rightward tracheal deviation and mass effect on the esophagus. A thyroid  ultrasound was recommended and this was performed 04/06/23. Multiple bilateral nodules were identified including a 3.2 predominantly cystic left inferior thyroid  nodule. The patient followed up with her otolaryngologist  Dr. Myrlene Miles 05/03/23 and he has kindly referred this patient to Interventional Radiology to discuss aspiration of this cyst with possible ethanol ablation.    Thyroid  Cosmetic Score:  Readily detected cosmetic problem (Grade 4).  We discussed treatment options including doing nothing, aspiration, and aspiration + ethanol ablation. I recommended including ethanol given incidence of recurrence, complications with aspiration alone including bleeding, and low risk nature of the procedure. She was amenable and  the procedure was performed 06/29/23.   She presents today for follow up via virtual video visit. She feels well without complaint.  She states that her swallowing problems have resolved.  She does have some left lower neck/shoulder pain for which she is being seen by another physician.  Past Medical History:  Diagnosis Date   Aortic stenosis, mild-moderate 07/2012   TTE October 21: Moderate aortic valve thickening with mild to moderate AI & mild AS; TEE: mild to moderate AI & MIld AS   Arthritis    Phreesia 11/06/2019   Asthma    Bilateral bunions    Bunionectomies performed   Bronchitis, chronic (HCC)    Cataract    Phreesia 11/06/2019   Heart disease    Hyperlipidemia    Hypertension    Good control   Inguinodynia    Bilateral groin pain   Lower extremity edema    Chronic. Venous Duplex 11/06/11 SUMMARY: 1) Bilateral Lower Extremities: No evidence of DVT or thrombophlebitis.  2) Right Common Femoral Vein: Demonstrated mild valvular insufficiency with a greater than (1) sec of duration. Mildly abnormal LE Venous duplex Doppler.   Obesity    Palpitations    Relatively well controlled   Scoliosis    DG Chest 2 View x-ray on 07/30/11 by Dr. Rosalio Miles shows a scoliosis.   Severe mitral regurgitation by prior echocardiogram 07/2012   Mild AMVL prolapse, Mod MR -- no MVP noted in 01/2018 -> 11/'21: Severe MR due to restricted movement of P3 scallop of PMVL (IIIB) due to incomplete leaflet coaptation -> thickened leaflets consistent with Rheumatic Heart Valve Disease.    Past Surgical History:  Procedure Laterality Date   ABDOMINAL HYSTERECTOMY     ANKLE ARTHROSCOPY Left    BUNIONECTOMY  Bilateral    CARDIAC EVENT MONITOR  03/2020   (04/05/2020 -05/04/2020): Mostly NSR.  Heart rate range 42-148 bpm.  3 short bursts of 4-5 beats NSVT (asymptomatic).  116 beat run of PAT.  Rare PACs and PVCs.  Some bigeminy and trigeminy.   CARDIOPULMONARY EXERCISE TEST (CPX)  05/09/2020   (done with Ex St  Echo): Normal Functional Capacity. No indication for CP limitations.  Body Habitus contributes to Exercise Intolerance.   EXERCISE STRESS ECHO  05/09/2020   TTE shows normal EF 65% with LVH No pericardial effusion Normal RV size and function Moderate appearing rheumatic MR/AR.;; -- NO COMMENT ON EXERCISE EFFECT ON MR!!! (even though this was the reason for ordering the test)   EYE SURGERY N/A    Phreesia 11/06/2019   IR RADIOLOGIST EVAL & MGMT  06/14/2023   KNEE ARTHROSCOPY Right    Left knee open surgery     pt thinks she only had shots in L knee, not surgery   RIGHT/LEFT HEART CATH AND CORONARY ANGIOGRAPHY N/A 03/28/2020   RIGHT/LEFT HEART CATH AND CORONARY ANGIOGRAPHY;  Lisa Lacer, MD;  Location: [facility] INVASIVE CV LAB; angiographically normal coronary arteries.  Cloacal LM. Normal RHC Pressures (PAP 40/13 - mean 22 mmHg, PCWP , V wave 25 mmHg c/w MR, LVEDP 15 mmHg); CO/CI 5.02 / 2.45 (reduced).   TEE WITHOUT CARDIOVERSION N/A 03/13/2020   Procedure: TRANSESOPHAGEAL ECHOCARDIOGRAM (TEE);  Surgeon: Lisa Lincoln, MD;  [facility] ENDOSCOPY;;; Severe MR 2/2 restricted P3 scallop of PMVL (IIIB) w/ incomplete leaflet coaptation.  Thickened /degenerative leaflets c/w Rheumatic Valvular Heart Disease.  2D PISA radius 0.9 cm with 2D ERO 0.26 cm2 with R Vol 62 cc. PV blunting w/ BP 109/60 mmHg. No MS. Mild-mod AI / Mild AS. Gr 2 plaque Aorta   TOTAL ABDOMINAL HYSTERECTOMY     TRANSTHORACIC ECHOCARDIOGRAM  05/09/2021   EF 60 to 65%.  No RWMA.  GR 1 DD with elevated LAP-moderately dilated left atrium (LA).  Normal RV size and function.  Moderate to severe MR with no MS.  Moderate aortic calcification/sclerosis with only mild stenosis.  Ascending aorta 39 mm.   TRANSTHORACIC ECHOCARDIOGRAM  01/2018   a) Normal LV size.  Mod Conc LVH.  EF 60-65%.  No R WMA.  GR 1 DD (high P).  Mild AS (MG 16 mmHg) w/ Mod AI.  Severe LA dilation.  Mild MR.; 01/2020:  EF 65-70%. No RWMA.  Severe MR w/ restricted PMVL  movement.  (Recommend TEE).  Mod AoV thickening w/ mild to mod AS & Mod AI.  Nl RV fxn.  Mildly elevated filling P. Severe LA dilation.   Zio Patch Monitor  05/2021   Predominant Rhythm Is Sinus Rhythm - HR range 45 -139 bpm & avg 74 bpm.  Occ PACs (2.9%) w/ some couplets and triplets,& rare PVCs. 20 Atrial Runs: Fastest - 6 beats w/ rate 235 bpm; Longest 12 beats @ 138 bpm (~ 5.2 sec); Symptoms were noted with PACs and PVCs not with atrial tachycardia runs.    Allergies: Patient has no known allergies.  Medications: Prior to Admission medications   Medication Sig Start Date End Date Taking? Authorizing Provider  acetaminophen  (TYLENOL ) 500 MG tablet Take 2 tablets (1,000 mg total) by mouth every 6 (six) hours as needed. 07/29/23   Marlin Simmonds, PA-C  amoxicillin  (AMOXIL ) 500 MG tablet Take 500 mg by mouth. Take 4 tablets before dental procedures.    [provider]  [facility] EXTRA STRENGTH)  20 MG CAPS Take 1 capsule by mouth daily.    [provider]  aspirin  EC 81 MG tablet Take 81 mg by mouth daily. Swallow whole.    [provider]  Calcium  Carb-Cholecalciferol (CALCIUM  + VITAMIN D3 PO) Take 600-800 mg by mouth in the morning.    [provider]  chlorthalidone  (HYGROTON ) 25 MG tablet TAKE 1/2 TABLET BY MOUTH DAILY 08/02/23   Lisa Lacer, MD  Cholecalciferol (VITAMIN D ) 50 MCG (2000 UT) CAPS Take 2,000 Units by mouth daily.    [provider]  diclofenac  Sodium (VOLTAREN ) 1 % GEL Apply 2 g topically 4 (four) times daily. 09/22/23   Long, Shereen Dike, MD  esomeprazole  (NEXIUM ) 20 MG capsule TAKE 1 CAPSULE(20 MG) BY MOUTH TWICE DAILY BEFORE A MEAL 08/16/23   Zehr, Jessica D, PA-C  fluticasone  (FLONASE) 50 MCG/ACT nasal spray Place 1 spray into both nostrils daily.    [provider]  hydroxypropyl methylcellulose / hypromellose (ISOPTO TEARS / GONIOVISC) 2.5 % ophthalmic solution Place 1 drop into both eyes in the morning and at  bedtime.    [provider]  ibuprofen  (ADVIL ) 200 MG tablet Take 600-800 mg by mouth as needed for mild pain (pain score 1-3) or headache.    [provider]  [name]  (KEPPRA ) 500 MG tablet Take 1 tablet (500 mg total) by mouth 2 (two) times daily. 05/04/23   Glory Larsen, MD  lidocaine  (LIDODERM ) 5 % Place 2 patches onto the skin daily. Remove & Discard patch within 12 hours or as directed by MD 07/29/23   Marlin Simmonds, PA-C  methocarbamol  (ROBAXIN ) 500 MG tablet Take 2 tablets (1,000 mg total) by mouth every 8 (eight) hours as needed for muscle spasms. 07/29/23   Marlin Simmonds, PA-C  metoprolol  succinate (TOPROL -XL) 25 MG 24 hr tablet TAKE 1 TABLET(25 MG) BY MOUTH DAILY 12/29/22   Lisa Lacer, MD  oxyCODONE  (ROXICODONE ) 5 MG immediate release tablet Take 1 tablet (5 mg total) by mouth every 6 (six) hours as needed for severe pain (pain score 7-10). 09/22/23   Long, Shereen Dike, MD  rosuvastatin  (CRESTOR ) 10 MG tablet TAKE 1 TABLET(10 MG) BY MOUTH DAILY 02/27/23   Elvira Hammersmith, MD  senna-docusate (SENOKOT-S) 8.6-50 MG tablet Take 1 tablet by mouth at bedtime as needed for mild constipation. 09/22/23   Long, Shereen Dike, MD  valsartan  (DIOVAN ) 320 MG tablet TAKE 1 TABLET(320 MG) BY MOUTH DAILY 12/20/22   Elvira Hammersmith, MD     Family History  Problem Relation Age of Onset   Arthritis Mother    Hyperlipidemia Mother    Diabetes Mother    Gout Mother    Hypertension Mother    Dementia Mother    Cancer Father        GI cancer   Hypertension Brother    Diabetes Brother    Hypertension Brother    Diabetes Maternal Grandfather    Migraines Neg Hx    Headache Neg Hx     Social History   Socioeconomic History   Marital status: Married    Spouse name: Porfirio Bristol   Number of children: 2   Years of education: Not on file   Highest education level: Not on file  Occupational History   Occupation: production    Employer: [employer]    Comment: check  printing/shipping  Tobacco Use   Smoking status: Never    Passive exposure: Never   Smokeless tobacco: Never  Vaping  Use   Vaping status: Never Used  Substance and Sexual Activity   Alcohol  use: No   Drug use: No   Sexual activity: Yes  Other Topics Concern   Not on file  Social History Narrative   Lives with her husband.  Their children live nearby.   Right handed   Caffeine: 1 cup/day   Social Drivers of Corporate investment banker Strain: Low Risk  (09/06/2023)   Overall Financial Resource Strain (CARDIA)    Difficulty of Paying Living Expenses: Not hard at all  Food Insecurity: No Food Insecurity (09/06/2023)   Hunger Vital Sign    Worried About Running Out of Food in the Last Year: Never true    Ran Out of Food in the Last Year: Never true  Transportation Needs: No Transportation Needs (09/06/2023)   PRAPARE - Administrator, Civil Service (Medical): No    Lack of Transportation (Non-Medical): No  Physical Activity: Inactive (09/06/2023)   Exercise Vital Sign    Days of Exercise per Week: 0 days    Minutes of Exercise per Session: 0 min  Stress: No Stress Concern Present (09/06/2023)   [employer] of Occupational Health - Occupational Stress Questionnaire    Feeling of Stress : Not at all  Social Connections: Socially Integrated (09/06/2023)   Social Connection and Isolation Panel [NHANES]    Frequency of Communication with Friends and Family: More than three times a week    Frequency of Social Gatherings with Friends and Family: More than three times a week    Attends Religious Services: More than 4 times per year    Active Member of [employer] or Organizations: Yes    Attends Engineer, structural: More than 4 times per year    Marital Status: Married     Vital Signs: There were no vitals taken for this visit.  Physical Exam  Patient is alert, oriented and able to participate fully in the conversation. No apparent discomfort or distress  observed. She appears appropriately dressed.    Imaging:  US  Thyroid  04/06/23      US  Thyroid  09/28/23       Labs:  CBC: Recent Labs    03/16/23 1725 07/27/23 1850 07/27/23 1859 07/28/23 0434  WBC 5.3 10.8*  --  7.0  HGB 12.4 12.9 15.3* 12.3  HCT 37.0 39.1 45.0 37.9  PLT 220 217  --  213    COAGS: No results for input(s): "INR", "APTT" in the last 8760 hours.  BMP: Recent Labs    03/16/23 1725 07/27/23 1850 07/27/23 1859 07/28/23 0434  NA 139 139 141 140  K 3.8 3.7 3.8 4.0  CL 106 102 103 107  CO2 26 25  --  26  GLUCOSE 125* 120* 122* 101*  BUN 13 19 20 19   CALCIUM  9.2 9.2  --  8.9  CREATININE 0.82 0.84 0.90 0.87  GFRNONAA >60 >60  --  >60    LIVER FUNCTION TESTS: Recent Labs    07/27/23 1850  BILITOT 0.2  AST 35  ALT 23  ALKPHOS 36*  PROT 6.8  ALBUMIN 3.4*    Assessment and Plan:  73 year old female with a history of left thyroid  cyst with compressive and cosmetic symptoms now s/p aspiration and treatment with ethanol ablation 06/29/22.  She has responded very well with resolution of symptoms and 93% volume reduction.  Follow up with IR as needed.  Creasie Doctor, MD Pager: [phone]    I spent a  total of 25 Minutes in virtual video clinical consultation, greater than 50% of which was counseling/coordinating care for symptomatic left thyroid  cyst.

## 2023-10-02 ENCOUNTER — Other Ambulatory Visit: Payer: Self-pay | Admitting: Cardiology

## 2023-10-04 ENCOUNTER — Inpatient Hospital Stay
Admission: RE | Admit: 2023-10-04 | Discharge: 2023-10-04 | Disposition: A | Discharge status: Home / Self Care | Source: Ambulatory Visit | Attending: Interventional Radiology

## 2023-10-04 DIAGNOSIS — E041 Nontoxic single thyroid nodule: Secondary | ICD-10-CM | POA: Diagnosis not present

## 2023-10-04 HISTORY — PX: IR RADIOLOGIST EVAL & MGMT: IMG5224

## 2023-10-08 ENCOUNTER — Ambulatory Visit
Admission: RE | Admit: 2023-10-08 | Discharge: 2023-10-08 | Disposition: A | Discharge status: Home / Self Care | Source: Ambulatory Visit | Attending: Emergency Medicine

## 2023-10-08 DIAGNOSIS — Z1231 Encounter for screening mammogram for malignant neoplasm of breast: Secondary | ICD-10-CM

## 2023-10-12 ENCOUNTER — Ambulatory Visit: Payer: Self-pay | Admitting: Emergency Medicine

## 2023-10-26 DIAGNOSIS — E041 Nontoxic single thyroid nodule: Secondary | ICD-10-CM | POA: Diagnosis not present

## 2023-10-27 ENCOUNTER — Other Ambulatory Visit: Payer: Self-pay | Admitting: Nurse Practitioner

## 2023-10-27 DIAGNOSIS — E041 Nontoxic single thyroid nodule: Secondary | ICD-10-CM
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
## 2023-11-16 ENCOUNTER — Ambulatory Visit (INDEPENDENT_AMBULATORY_CARE_PROVIDER_SITE_OTHER): Payer: PPO | Admitting: Neurology

## 2023-11-16 ENCOUNTER — Encounter: Payer: Self-pay | Admitting: Neurology

## 2023-11-16 VITALS — BP 119/71 | HR 62 | Ht 66.0 in | Wt 198.8 lb

## 2023-11-16 DIAGNOSIS — G40209 Localization-related (focal) (partial) symptomatic epilepsy and epileptic syndromes with complex partial seizures, not intractable, without status epilepticus: Secondary | ICD-10-CM | POA: Diagnosis not present

## 2023-11-16 DIAGNOSIS — G40909 Epilepsy, unspecified, not intractable, without status epilepticus: Secondary | ICD-10-CM

## 2023-11-16 MED ORDER — LEVETIRACETAM 750 MG PO TABS: 750.0000 mg | ORAL_TABLET | Freq: Two times a day (BID) | ORAL | 4 refills | Status: DC
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
## 2023-11-17 LAB — LEVETIRACETAM LEVEL: Levetiracetam Lvl: 17.8 ug/mL (ref 10.0–40.0)

## 2023-11-18 ENCOUNTER — Ambulatory Visit: Payer: Self-pay | Admitting: Neurology

## 2023-12-21 ENCOUNTER — Other Ambulatory Visit: Payer: Self-pay | Admitting: Gastroenterology

## 2023-12-28 ENCOUNTER — Other Ambulatory Visit: Payer: Self-pay | Admitting: Emergency Medicine

## 2023-12-28 DIAGNOSIS — I10 Essential (primary) hypertension: Secondary | ICD-10-CM

## 2023-12-28 MED ORDER — VALSARTAN 320 MG PO TABS: 320.0000 mg | ORAL_TABLET | Freq: Every day | ORAL | 3 refills | Status: AC

## 2023-12-28 NOTE — Telephone Encounter (Signed)
 Copied from CRM [id number]. Topic: Clinical - Medication Refill >> Dec 28, 2023 10:03 AM Thersia C wrote: Medication: valsartan  (DIOVAN ) 320 MG tablet  Has the patient contacted their pharmacy? Yes (Agent: If no, request that the patient contact the pharmacy for the refill. If patient does not wish to contact the pharmacy document the reason why and proceed with request.) (Agent: If yes, when and what did the pharmacy advise?)  This is the patient's preferred pharmacy:  [facility] - [place], [place] - [address] [place] [address] Phone: [phone] Fax: [phone]  [facility] [address] [place] - [address] [place] [address] Phone: [phone] Fax: [phone]  [id number] [place], [place] - [address] AT [facility] & [address] [place] [address] Phone: [phone] Fax: [phone]  Is this the correct pharmacy for this prescription? Yes If no, delete pharmacy and type the correct one.   Has the prescription been filled recently? No  Is the patient out of the medication? Yes  Has the patient been seen for an appointment in the last year OR does the patient have an upcoming appointment? Yes  Can we respond through MyChart? Yes  Agent: Please be advised that Rx refills may take up to 3 business days. We ask that you follow-up with your pharmacy.

## 2023-12-28 NOTE — Telephone Encounter (Signed)
 Patient called to verify what pharmacy she wants used. Patient states she wants medication sent to [facility] on [facility]. Pharmacy is updated in chart.

## 2024-01-24 ENCOUNTER — Encounter: Payer: Self-pay | Admitting: Emergency Medicine

## 2024-01-24 ENCOUNTER — Ambulatory Visit: Admitting: Emergency Medicine

## 2024-01-24 ENCOUNTER — Ambulatory Visit: Payer: Self-pay | Admitting: Emergency Medicine

## 2024-01-24 VITALS — HR 60 | Temp 97.5°F | Ht 66.0 in | Wt 202.0 lb

## 2024-01-24 DIAGNOSIS — Z23 Encounter for immunization: Secondary | ICD-10-CM

## 2024-01-24 DIAGNOSIS — G4733 Obstructive sleep apnea (adult) (pediatric): Secondary | ICD-10-CM | POA: Diagnosis not present

## 2024-01-24 DIAGNOSIS — E785 Hyperlipidemia, unspecified: Secondary | ICD-10-CM | POA: Diagnosis not present

## 2024-01-24 DIAGNOSIS — I38 Endocarditis, valve unspecified: Secondary | ICD-10-CM | POA: Diagnosis not present

## 2024-01-24 DIAGNOSIS — I10 Essential (primary) hypertension: Secondary | ICD-10-CM | POA: Diagnosis not present

## 2024-01-24 DIAGNOSIS — R519 Headache, unspecified: Secondary | ICD-10-CM | POA: Diagnosis not present

## 2024-01-24 DIAGNOSIS — M150 Primary generalized (osteo)arthritis: Secondary | ICD-10-CM | POA: Diagnosis not present

## 2024-01-24 DIAGNOSIS — G40909 Epilepsy, unspecified, not intractable, without status epilepticus: Secondary | ICD-10-CM | POA: Diagnosis not present

## 2024-01-24 LAB — CBC WITH DIFFERENTIAL/PLATELET
Basophils Absolute: 0 K/uL (ref 0.0–0.1)
Basophils Relative: 0.8 % (ref 0.0–3.0)
Eosinophils Absolute: 0.1 K/uL (ref 0.0–0.7)
Eosinophils Relative: 1.6 % (ref 0.0–5.0)
HCT: 38.6 % (ref 36.0–46.0)
Hemoglobin: 12.9 g/dL (ref 12.0–15.0)
Lymphocytes Relative: 30.4 % (ref 12.0–46.0)
Lymphs Abs: 1.1 K/uL (ref 0.7–4.0)
MCHC: 33.3 g/dL (ref 30.0–36.0)
MCV: 88 fl (ref 78.0–100.0)
Monocytes Absolute: 0.4 K/uL (ref 0.1–1.0)
Monocytes Relative: 10.1 % (ref 3.0–12.0)
Neutro Abs: 2.1 K/uL (ref 1.4–7.7)
Neutrophils Relative %: 57.1 % (ref 43.0–77.0)
Platelets: 230 K/uL (ref 150.0–400.0)
RBC: 4.39 Mil/uL (ref 3.87–5.11)
RDW: 14 % (ref 11.5–15.5)
WBC: 3.7 K/uL — ABNORMAL LOW (ref 4.0–10.5)

## 2024-01-24 LAB — COMPREHENSIVE METABOLIC PANEL WITH GFR
ALT: 16 U/L (ref 0–35)
AST: 18 U/L (ref 0–37)
Albumin: 3.9 g/dL (ref 3.5–5.2)
Alkaline Phosphatase: 47 U/L (ref 39–117)
BUN: 16 mg/dL (ref 6–23)
CO2: 31 meq/L (ref 19–32)
Calcium: 9.5 mg/dL (ref 8.4–10.5)
Chloride: 104 meq/L (ref 96–112)
Creatinine, Ser: 0.86 mg/dL (ref 0.40–1.20)
GFR: 67.22 mL/min (ref >60.00)
Glucose, Bld: 115 mg/dL — ABNORMAL HIGH (ref 70–99)
Potassium: 4.2 meq/L (ref 3.5–5.1)
Sodium: 141 meq/L (ref 135–145)
Total Bilirubin: 0.2 mg/dL (ref 0.2–1.2)
Total Protein: 7.1 g/dL (ref 6.0–8.3)

## 2024-01-24 LAB — LIPID PANEL
Cholesterol: 162 mg/dL (ref 0–200)
HDL: 55.5 mg/dL (ref >39.00)
LDL Cholesterol: 89 mg/dL (ref 0–99)
NonHDL: 106.18
Total CHOL/HDL Ratio: 3
Triglycerides: 86 mg/dL (ref 0.0–149.0)
VLDL: 17.2 mg/dL (ref 0.0–40.0)

## 2024-01-24 LAB — HEMOGLOBIN A1C: Hgb A1c MFr Bld: 6.4 % (ref 4.6–6.5)

## 2024-01-24 NOTE — Assessment & Plan Note (Signed)
 As per your cardiologist visit 07/20/2023: As long as there continues to be interval change in valvular disease, we will continue annual echocardiograms, otherwise can switch to every 2 years. She does have a standing prescription for SBE prophylaxis antibiotics for procedures based on the mitral valve disease, however at this level of MR, not yet indicated

## 2024-01-24 NOTE — Assessment & Plan Note (Signed)
 Not on CPAP

## 2024-01-24 NOTE — Assessment & Plan Note (Signed)
 Reviewed xrays that have been done over the years including MRI. Shows arthritis in multiple joints. She was advised to use arthritis meds - tylenol or motrin

## 2024-01-24 NOTE — Patient Instructions (Signed)
 Health Maintenance After Age 73 After age 27, you are at a higher risk for certain long-term diseases and infections as well as injuries from falls. Falls are a major cause of broken bones and head injuries in people who are older than age 73. Getting regular preventive care can help to keep you healthy and well. Preventive care includes getting regular testing and making lifestyle changes as recommended by your health care provider. Talk with your health care provider about: Which screenings and tests you should have. A screening is a test that checks for a disease when you have no symptoms. A diet and exercise plan that is right for you. What should I know about screenings and tests to prevent falls? Screening and testing are the best ways to find a health problem early. Early diagnosis and treatment give you the best chance of managing medical conditions that are common after age 90. Certain conditions and lifestyle choices may make you more likely to have a fall. Your health care provider may recommend: Regular vision checks. Poor vision and conditions such as cataracts can make you more likely to have a fall. If you wear glasses, make sure to get your prescription updated if your vision changes. Medicine review. Work with your health care provider to regularly review all of the medicines you are taking, including over-the-counter medicines. Ask your health care provider about any side effects that may make you more likely to have a fall. Tell your health care provider if any medicines that you take make you feel dizzy or sleepy. Strength and balance checks. Your health care provider may recommend certain tests to check your strength and balance while standing, walking, or changing positions. Foot health exam. Foot pain and numbness, as well as not wearing proper footwear, can make you more likely to have a fall. Screenings, including: Osteoporosis screening. Osteoporosis is a condition that causes  the bones to get weaker and break more easily. Blood pressure screening. Blood pressure changes and medicines to control blood pressure can make you feel dizzy. Depression screening. You may be more likely to have a fall if you have a fear of falling, feel depressed, or feel unable to do activities that you used to do. Alcohol  use screening. Using too much alcohol  can affect your balance and may make you more likely to have a fall. Follow these instructions at home: Lifestyle Do not drink alcohol  if: Your health care provider tells you not to drink. If you drink alcohol : Limit how much you have to: 0-1 drink a day for women. 0-2 drinks a day for men. Know how much alcohol  is in your drink. In the [place] one drink equals one 12 oz bottle of beer (355 mL), one 5 oz glass of wine (148 mL), or one 1 oz glass of hard liquor (44 mL). Do not use any products that contain nicotine or tobacco. These products include cigarettes, chewing tobacco, and vaping devices, such as e-cigarettes. If you need help quitting, ask your health care provider. Activity  Follow a regular exercise program to stay fit. This will help you maintain your balance. Ask your health care provider what types of exercise are appropriate for you. If you need a cane or walker, use it as recommended by your health care provider. Wear supportive shoes that have nonskid soles. Safety  Remove any tripping hazards, such as rugs, cords, and clutter. Install safety equipment such as grab bars in bathrooms and safety rails on stairs. Keep rooms and walkways  well-lit. General instructions Talk with your health care provider about your risks for falling. Tell your health care provider if: You fall. Be sure to tell your health care provider about all falls, even ones that seem minor. You feel dizzy, tiredness (fatigue), or off-balance. Take over-the-counter and prescription medicines only as told by your health care provider. These include  supplements. Eat a healthy diet and maintain a healthy weight. A healthy diet includes low-fat dairy products, low-fat (lean) meats, and fiber from whole grains, beans, and lots of fruits and vegetables. Stay current with your vaccines. Schedule regular health, dental, and eye exams. Summary Having a healthy lifestyle and getting preventive care can help to protect your health and wellness after age 15. Screening and testing are the best way to find a health problem early and help you avoid having a fall. Early diagnosis and treatment give you the best chance for managing medical conditions that are more common for people who are older than age 42. Falls are a major cause of broken bones and head injuries in people who are older than age 64. Take precautions to prevent a fall at home. Work with your health care provider to learn what changes you can make to improve your health and wellness and to prevent falls. This information is not intended to replace advice given to you by your health care provider. Make sure you discuss any questions you have with your health care provider. Document Revised: 09/02/2020 Document Reviewed: 09/02/2020 Elsevier Patient Education  2024 [employer].

## 2024-01-24 NOTE — Progress Notes (Signed)
 Lisa Miles 73 y.o.   Chief Complaint  Patient presents with   Medical Management of Chronic Issues    6 month f/u    HISTORY OF PRESENT ILLNESS: This is a 73 y.o. female here for 44-month follow-up of multiple chronic medical conditions including hypertension History of valvular heart disease.  Follows up with cardiologist on a regular basis Overall doing well.  Has no complaints or medical concerns today.  HPI   Prior to Admission medications   Medication Sig Start Date End Date Taking? Authorizing Provider  acetaminophen  (TYLENOL ) 500 MG tablet Take 2 tablets (1,000 mg total) by mouth every 6 (six) hours as needed. 07/29/23  Yes Tammy Sor, PA-C  Apoaequorin (PREVAGEN EXTRA STRENGTH) 20 MG CAPS Take 1 capsule by mouth daily.   Yes [provider]  aspirin  EC 81 MG tablet Take 81 mg by mouth daily. Swallow whole.   Yes [provider]  Calcium  Carb-Cholecalciferol (CALCIUM  + VITAMIN D3 PO) Take 600-800 mg by mouth in the morning.   Yes [provider]  chlorthalidone  (HYGROTON ) 25 MG tablet TAKE 1/2 TABLET BY MOUTH DAILY 08/02/23  Yes Anner Alm ORN, MD  Cholecalciferol (VITAMIN D ) 50 MCG (2000 UT) CAPS Take 2,000 Units by mouth daily.   Yes [provider]  diclofenac  Sodium (VOLTAREN ) 1 % GEL Apply 2 g topically 4 (four) times daily. 09/22/23  Yes Long, Fonda MATSU, MD  esomeprazole  (NEXIUM ) 20 MG capsule TAKE 1 CAPSULE(20 MG) BY MOUTH TWICE DAILY BEFORE A MEAL 12/21/23  Yes Abran Norleen SAILOR, MD  fluticasone  [facility]) 50 MCG/ACT nasal spray Place 1 spray into both nostrils daily.   Yes [provider]  hydroxypropyl methylcellulose / hypromellose (ISOPTO TEARS / GONIOVISC) 2.5 % ophthalmic solution Place 1 drop into both eyes in the morning and at bedtime.   Yes [provider]  ibuprofen  (ADVIL ) 200 MG tablet Take 600-800 mg by mouth as needed for mild pain (pain score 1-3) or headache.   Yes [provider]   [name]  (KEPPRA ) 750 MG tablet Take 1 tablet (750 mg total) by mouth 2 (two) times daily. 11/16/23  Yes Gayland Lauraine PARAS, NP  metoprolol  succinate (TOPROL -XL) 25 MG 24 hr tablet TAKE 1 TABLET(25 MG) BY MOUTH DAILY 10/05/23  Yes Anner Alm ORN, MD  rosuvastatin  (CRESTOR ) 10 MG tablet TAKE 1 TABLET(10 MG) BY MOUTH DAILY 02/27/23  Yes Delora Gravatt, Emil Schanz, MD  senna-docusate (SENOKOT-S) 8.6-50 MG tablet Take 1 tablet by mouth at bedtime as needed for mild constipation. 09/22/23  Yes Long, Fonda MATSU, MD  valsartan  (DIOVAN ) 320 MG tablet Take 1 tablet (320 mg total) by mouth daily. 12/28/23  Yes Shanese Riemenschneider, Emil Schanz, MD  amoxicillin  (AMOXIL ) 500 MG tablet Take 500 mg by mouth. Take 4 tablets before dental procedures. Patient not taking: Reported on 01/24/2024    [provider]  lidocaine  (LIDODERM ) 5 % Place 2 patches onto the skin daily. Remove & Discard patch within 12 hours or as directed by MD Patient not taking: Reported on 01/24/2024 07/29/23   Tammy Sor, PA-C  methocarbamol  (ROBAXIN ) 500 MG tablet Take 2 tablets (1,000 mg total) by mouth every 8 (eight) hours as needed for muscle spasms. Patient not taking: Reported on 01/24/2024 07/29/23   Tammy Sor, PA-C  oxyCODONE  (ROXICODONE ) 5 MG immediate release tablet Take 1 tablet (5 mg total) by mouth every 6 (six) hours as needed for severe pain (pain score 7-10). Patient not taking: Reported on 01/24/2024 09/22/23   Long,  Fonda MATSU, MD    No Known Allergies  Patient Active Problem List   Diagnosis Date Noted   Thyroid  nodule 03/22/2023   Seizure disorder (HCC) 01/06/2023   Memory problem 07/07/2022   Chronic nonintractable headache 07/07/2022   PAC (premature atrial contraction) -with atrial Runs 07/19/2021   Mild aortic stenosis 06/06/2021   Valvular heart disease 02/20/2021   OSA (obstructive sleep apnea) 11/02/2019   Non-restorative sleep 08/24/2019   Primary osteoarthritis involving multiple joints 04/01/2018   OAB  (overactive bladder) 04/01/2018   Thyromegaly 03/28/2013   Essential hypertension 07/14/2012   Dyslipidemia 05/30/2009   Moderate to severe mitral regurgitation 05/30/2009    Past Medical History:  Diagnosis Date   Aortic stenosis, mild-moderate 07/2012   TTE October 21: Moderate aortic valve thickening with mild to moderate AI & mild AS; TEE: mild to moderate AI & MIld AS   Arthritis    Phreesia 11/06/2019   Asthma    Bilateral bunions    Bunionectomies performed   Bronchitis, chronic (HCC)    Cataract    Phreesia 11/06/2019   Heart disease    Hyperlipidemia    Hypertension    Good control   Inguinodynia    Bilateral groin pain   Lower extremity edema    Chronic. Venous Duplex 11/06/11 SUMMARY: 1) Bilateral Lower Extremities: No evidence of DVT or thrombophlebitis.  2) Right Common Femoral Vein: Demonstrated mild valvular insufficiency with a greater than (1) sec of duration. Mildly abnormal LE Venous duplex Doppler.   Obesity    Palpitations    Relatively well controlled   Scoliosis    DG Chest 2 View x-ray on 07/30/11 by Dr. Humberto shows a scoliosis.   Severe mitral regurgitation by prior echocardiogram 07/2012   Mild AMVL prolapse, Mod MR -- no MVP noted in 01/2018 -> 11/'21: Severe MR due to restricted movement of P3 scallop of PMVL (IIIB) due to incomplete leaflet coaptation -> thickened leaflets consistent with Rheumatic Heart Valve Disease.    Past Surgical History:  Procedure Laterality Date   ABDOMINAL HYSTERECTOMY     ANKLE ARTHROSCOPY Left    BUNIONECTOMY Bilateral    CARDIAC EVENT MONITOR  03/2020   (04/05/2020 -05/04/2020): Mostly NSR.  Heart rate range 42-148 bpm.  3 short bursts of 4-5 beats NSVT (asymptomatic).  116 beat run of PAT.  Rare PACs and PVCs.  Some bigeminy and trigeminy.   CARDIOPULMONARY EXERCISE TEST (CPX)  05/09/2020   (done with Ex St Echo): Normal Functional Capacity. No indication for CP limitations.  Body Habitus contributes to Exercise  Intolerance.   EXERCISE STRESS ECHO  05/09/2020   TTE shows normal EF 65% with LVH No pericardial effusion Normal RV size and function Moderate appearing rheumatic MR/AR.;; -- NO COMMENT ON EXERCISE EFFECT ON MR!!! (even though this was the reason for ordering the test)   EYE SURGERY N/A    Phreesia 11/06/2019   IR RADIOLOGIST EVAL & MGMT  06/14/2023   IR RADIOLOGIST EVAL & MGMT  10/04/2023   KNEE ARTHROSCOPY Right    Left knee open surgery     pt thinks she only had shots in L knee, not surgery   RIGHT/LEFT HEART CATH AND CORONARY ANGIOGRAPHY N/A 03/28/2020   RIGHT/LEFT HEART CATH AND CORONARY ANGIOGRAPHY;  Anner Alm ORN, MD;  Location: [facility] INVASIVE CV LAB; angiographically normal coronary arteries.  Cloacal LM. Normal RHC Pressures (PAP 40/13 - mean 22 mmHg, PCWP , V wave 25 mmHg c/w MR, LVEDP 15 mmHg);  CO/CI 5.02 / 2.45 (reduced).   TEE WITHOUT CARDIOVERSION N/A 03/13/2020   Procedure: TRANSESOPHAGEAL ECHOCARDIOGRAM (TEE);  Surgeon: Barbaraann Darryle Ned, MD;  [facility] ENDOSCOPY;;; Severe MR 2/2 restricted P3 scallop of PMVL (IIIB) w/ incomplete leaflet coaptation.  Thickened /degenerative leaflets c/w Rheumatic Valvular Heart Disease.  2D PISA radius 0.9 cm with 2D ERO 0.26 cm2 with R Vol 62 cc. PV blunting w/ BP 109/60 mmHg. No MS. Mild-mod AI / Mild AS. Gr 2 plaque Aorta   TOTAL ABDOMINAL HYSTERECTOMY     TRANSTHORACIC ECHOCARDIOGRAM  05/09/2021   EF 60 to 65%.  No RWMA.  GR 1 DD with elevated LAP-moderately dilated left atrium (LA).  Normal RV size and function.  Moderate to severe MR with no MS.  Moderate aortic calcification/sclerosis with only mild stenosis.  Ascending aorta 39 mm.   TRANSTHORACIC ECHOCARDIOGRAM  01/2018   a) Normal LV size.  Mod Conc LVH.  EF 60-65%.  No R WMA.  GR 1 DD (high P).  Mild AS (MG 16 mmHg) w/ Mod AI.  Severe LA dilation.  Mild MR.; 01/2020:  EF 65-70%. No RWMA.  Severe MR w/ restricted PMVL movement.  (Recommend TEE).  Mod AoV thickening w/ mild to mod AS &  Mod AI.  Nl RV fxn.  Mildly elevated filling P. Severe LA dilation.   Zio Patch Monitor  05/2021   Predominant Rhythm Is Sinus Rhythm - HR range 45 -139 bpm & avg 74 bpm.  Occ PACs (2.9%) w/ some couplets and triplets,& rare PVCs. 20 Atrial Runs: Fastest - 6 beats w/ rate 235 bpm; Longest 12 beats @ 138 bpm (~ 5.2 sec); Symptoms were noted with PACs and PVCs not with atrial tachycardia runs.    Social History   Socioeconomic History   Marital status: Married    Spouse name: Lamar   Number of children: 2   Years of education: Not on file   Highest education level: Associate degree: occupational, Scientist, product/process development, or vocational program  Occupational History   Occupation: Aeronautical engineer: Publishing rights manager    Comment: check printing/shipping  Tobacco Use   Smoking status: Never    Passive exposure: Never   Smokeless tobacco: Never  Vaping Use   Vaping status: Never Used  Substance and Sexual Activity   Alcohol  use: No   Drug use: No   Sexual activity: Yes  Other Topics Concern   Not on file  Social History Narrative   Lives with her husband.  Their children live nearby.   Right handed   Caffeine: 1 cup/day   Social Drivers of Corporate investment banker Strain: Low Risk  (01/21/2024)   Overall Financial Resource Strain (CARDIA)    Difficulty of Paying Living Expenses: Not very hard  Food Insecurity: No Food Insecurity (01/21/2024)   Hunger Vital Sign    Worried About Running Out of Food in the Last Year: Never true    Ran Out of Food in the Last Year: Never true  Transportation Needs: Unmet Transportation Needs (01/21/2024)   PRAPARE - Administrator, Civil Service (Medical): Yes    Lack of Transportation (Non-Medical): No  Physical Activity: Inactive (09/06/2023)   Exercise Vital Sign    Days of Exercise per Week: 0 days    Minutes of Exercise per Session: 0 min  Stress: No Stress Concern Present (09/06/2023)   [employer] of Occupational Health -  Occupational Stress Questionnaire    Feeling of Stress : Not at  all  Social Connections: Moderately Integrated (01/21/2024)   Social Connection and Isolation Panel    Frequency of Communication with Friends and Family: More than three times a week    Frequency of Social Gatherings with Friends and Family: More than three times a week    Attends Religious Services: More than 4 times per year    Active Member of [employer] or Organizations: No    Attends Banker Meetings: Not on file    Marital Status: Married  Catering manager Violence: Not At Risk (09/06/2023)   Humiliation, Afraid, Rape, and Kick questionnaire    Fear of Current or Ex-Partner: No    Emotionally Abused: No    Physically Abused: No    Sexually Abused: No    Family History  Problem Relation Age of Onset   Arthritis Mother    Hyperlipidemia Mother    Diabetes Mother    Gout Mother    Hypertension Mother    Dementia Mother    Cancer Father        GI cancer   Hypertension Brother    Diabetes Brother    Hypertension Brother    Diabetes Maternal Grandfather    Migraines Neg Hx    Headache Neg Hx      Review of Systems  Constitutional: Negative.  Negative for chills and fever.  HENT: Negative.  Negative for congestion and sore throat.   Respiratory: Negative.  Negative for cough and shortness of breath.   Cardiovascular: Negative.  Negative for chest pain and palpitations.  Gastrointestinal:  Negative for abdominal pain, diarrhea, nausea and vomiting.  Genitourinary: Negative.  Negative for dysuria and hematuria.  Skin: Negative.  Negative for rash.  Neurological: Negative.  Negative for dizziness and headaches.  All other systems reviewed and are negative.   Vitals:   01/24/24 1001  Pulse: 60  Temp: (!) 97.5 F (36.4 C)  SpO2: 95%    Physical Exam Vitals reviewed.  Constitutional:      Appearance: Normal appearance.  HENT:     Head: Normocephalic.     Mouth/Throat:     Mouth: Mucous  membranes are moist.     Pharynx: Oropharynx is clear.  Eyes:     Extraocular Movements: Extraocular movements intact.     Conjunctiva/sclera: Conjunctivae normal.     Pupils: Pupils are equal, round, and reactive to light.  Cardiovascular:     Rate and Rhythm: Normal rate and regular rhythm.     Pulses: Normal pulses.     Heart sounds: Murmur heard.  Pulmonary:     Effort: Pulmonary effort is normal.     Breath sounds: Normal breath sounds.  Abdominal:     Palpations: Abdomen is soft.     Tenderness: There is no abdominal tenderness.  Musculoskeletal:     Cervical back: No tenderness.  Lymphadenopathy:     Cervical: No cervical adenopathy.  Skin:    General: Skin is warm and dry.     Capillary Refill: Capillary refill takes less than 2 seconds.  Neurological:     General: No focal deficit present.     Mental Status: She is alert and oriented to Dann, place, and time.  Psychiatric:        Mood and Affect: Mood normal.        Behavior: Behavior normal.      ASSESSMENT & PLAN: A total of 40 minutes was spent with the patient and counseling/coordination of care regarding preparing for this visit, review  of most recent office visit notes, review of most recent cardiologist and neurologist office visit notes, review of multiple chronic medical conditions and their management, review of all medications, review of most recent bloodwork results, review of health maintenance items, education on nutrition, prognosis, documentation, and need for follow up.   Problem List Items Addressed This Visit       Cardiovascular and Mediastinum   Essential hypertension - Primary (Chronic)   BP Readings from Last 3 Encounters:  11/16/23 119/71  09/22/23 124/60  08/10/23 124/70  Well-controlled hypertension Continue chlorthalidone  25 mg and valsartan  320 mg daily Also on metoprolol  succinate 25 mg daily       Relevant Orders   Lipid panel   Comprehensive metabolic panel with GFR    CBC with Differential/Platelet   Valvular heart disease (Chronic)   As per your cardiologist visit 07/20/2023: As long as there continues to be interval change in valvular disease, we will continue annual echocardiograms, otherwise can switch to every 2 years. She does have a standing prescription for SBE prophylaxis antibiotics for procedures based on the mitral valve disease, however at this level of MR, not yet indicated        Respiratory   OSA (obstructive sleep apnea) (Chronic)   Not on CPAP.      Relevant Orders   CBC with Differential/Platelet     Nervous and Auditory   Seizure disorder (HCC)   Recently started on Keppra  500 mg twice a day by neurology service  Tolerating medication well Probably contributing to occasional headaches      Relevant Orders   CBC with Differential/Platelet     Musculoskeletal and Integument   Primary osteoarthritis involving multiple joints   Reviewed xrays that have been done over the years including MRI. Shows arthritis in multiple joints. She was advised to use arthritis meds - tylenol  or motrin        Relevant Orders   CBC with Differential/Platelet     Other   Dyslipidemia (Chronic)   Chronic stable condition Lipid profile done today Continue rosuvastatin  10 mg daily      Relevant Orders   Lipid panel   Hemoglobin A1c   Chronic nonintractable headache   Pain management discussed.  May take Tylenol  and or Advil  as needed. Clinically stable.  No red flag signs or symptoms. Neurology evaluation on 11/16/2023.  Office visit notes reviewed      Other Visit Diagnoses       Immunization due       Relevant Orders   Flu vaccine HIGH DOSE PF(Fluzone Trivalent) (Completed)      Patient Instructions  Health Maintenance After Age 50 After age 47, you are at a higher risk for certain long-term diseases and infections as well as injuries from falls. Falls are a major cause of broken bones and head injuries in people who are older  than age 24. Getting regular preventive care can help to keep you healthy and well. Preventive care includes getting regular testing and making lifestyle changes as recommended by your health care provider. Talk with your health care provider about: Which screenings and tests you should have. A screening is a test that checks for a disease when you have no symptoms. A diet and exercise plan that is right for you. What should I know about screenings and tests to prevent falls? Screening and testing are the best ways to find a health problem early. Early diagnosis and treatment give you the best chance of managing  medical conditions that are common after age 27. Certain conditions and lifestyle choices may make you more likely to have a fall. Your health care provider may recommend: Regular vision checks. Poor vision and conditions such as cataracts can make you more likely to have a fall. If you wear glasses, make sure to get your prescription updated if your vision changes. Medicine review. Work with your health care provider to regularly review all of the medicines you are taking, including over-the-counter medicines. Ask your health care provider about any side effects that may make you more likely to have a fall. Tell your health care provider if any medicines that you take make you feel dizzy or sleepy. Strength and balance checks. Your health care provider may recommend certain tests to check your strength and balance while standing, walking, or changing positions. Foot health exam. Foot pain and numbness, as well as not wearing proper footwear, can make you more likely to have a fall. Screenings, including: Osteoporosis screening. Osteoporosis is a condition that causes the bones to get weaker and break more easily. Blood pressure screening. Blood pressure changes and medicines to control blood pressure can make you feel dizzy. Depression screening. You may be more likely to have a fall if you have  a fear of falling, feel depressed, or feel unable to do activities that you used to do. Alcohol  use screening. Using too much alcohol  can affect your balance and may make you more likely to have a fall. Follow these instructions at home: Lifestyle Do not drink alcohol  if: Your health care provider tells you not to drink. If you drink alcohol : Limit how much you have to: 0-1 drink a day for women. 0-2 drinks a day for men. Know how much alcohol  is in your drink. In the [place] one drink equals one 12 oz bottle of beer (355 mL), one 5 oz glass of wine (148 mL), or one 1 oz glass of hard liquor (44 mL). Do not use any products that contain nicotine or tobacco. These products include cigarettes, chewing tobacco, and vaping devices, such as e-cigarettes. If you need help quitting, ask your health care provider. Activity  Follow a regular exercise program to stay fit. This will help you maintain your balance. Ask your health care provider what types of exercise are appropriate for you. If you need a cane or walker, use it as recommended by your health care provider. Wear supportive shoes that have nonskid soles. Safety  Remove any tripping hazards, such as rugs, cords, and clutter. Install safety equipment such as grab bars in bathrooms and safety rails on stairs. Keep rooms and walkways well-lit. General instructions Talk with your health care provider about your risks for falling. Tell your health care provider if: You fall. Be sure to tell your health care provider about all falls, even ones that seem minor. You feel dizzy, tiredness (fatigue), or off-balance. Take over-the-counter and prescription medicines only as told by your health care provider. These include supplements. Eat a healthy diet and maintain a healthy weight. A healthy diet includes low-fat dairy products, low-fat (lean) meats, and fiber from whole grains, beans, and lots of fruits and vegetables. Stay current with your  vaccines. Schedule regular health, dental, and eye exams. Summary Having a healthy lifestyle and getting preventive care can help to protect your health and wellness after age 62. Screening and testing are the best way to find a health problem early and help you avoid having a fall. Early diagnosis and  treatment give you the best chance for managing medical conditions that are more common for people who are older than age 74. Falls are a major cause of broken bones and head injuries in people who are older than age 52. Take precautions to prevent a fall at home. Work with your health care provider to learn what changes you can make to improve your health and wellness and to prevent falls. This information is not intended to replace advice given to you by your health care provider. Make sure you discuss any questions you have with your health care provider. Document Revised: 09/02/2020 Document Reviewed: 09/02/2020 Elsevier Patient Education  2024 Elsevier Inc.    Emil Schaumann, MD [facility]

## 2024-01-24 NOTE — Assessment & Plan Note (Signed)
Chronic stable condition Lipid profile done today Continue rosuvastatin 10 mg daily

## 2024-01-24 NOTE — Assessment & Plan Note (Signed)
 Pain management discussed.  May take Tylenol  and or Advil  as needed. Clinically stable.  No red flag signs or symptoms. Neurology evaluation on 11/16/2023.  Office visit notes reviewed

## 2024-01-24 NOTE — Assessment & Plan Note (Signed)
 BP Readings from Last 3 Encounters:  11/16/23 119/71  09/22/23 124/60  08/10/23 124/70  Well-controlled hypertension Continue chlorthalidone  25 mg and valsartan  320 mg daily Also on metoprolol  succinate 25 mg daily

## 2024-01-24 NOTE — Assessment & Plan Note (Signed)
 Recently started on Keppra 500 mg twice a day by neurology service  Tolerating medication well Probably contributing to occasional headaches

## 2024-02-28 ENCOUNTER — Encounter: Payer: Self-pay | Admitting: Radiology

## 2024-03-11 ENCOUNTER — Other Ambulatory Visit: Payer: Self-pay | Admitting: Emergency Medicine

## 2024-03-11 DIAGNOSIS — E785 Hyperlipidemia, unspecified: Secondary | ICD-10-CM

## 2024-03-17 ENCOUNTER — Ambulatory Visit: Payer: Self-pay

## 2024-03-17 NOTE — Telephone Encounter (Signed)
Patient has an appointment scheduled next week.

## 2024-03-17 NOTE — Telephone Encounter (Signed)
 FYI Only or Action Required?: Action required by provider: request for appointment.  Patient was last seen in primary care on 01/24/2024 by Purcell Emil Schanz, MD.  Called Nurse Triage reporting Diarrhea and Headache.  Symptoms began several weeks ago.  Interventions attempted: Rest, hydration, or home remedies.  Symptoms are: gradually worsening.  Triage Disposition: See Physician Within 24 Hours  Patient/caregiver understands and will follow disposition?: Unsure Reason for Disposition  [1] MODERATE diarrhea (e.g., 4-6 times / day more than normal) AND [2] present > 48 hours (2 days)  Answer Assessment - Initial Assessment Questions 1. DIARRHEA SEVERITY: How bad is the diarrhea? How many more stools have you had in the past 24 hours than normal?      5-6  2. ONSET: When did the diarrhea begin?      A week ago  3. STOOL DESCRIPTION:  How loose or watery is the diarrhea? What is the stool color? Is there any blood or mucous in the stool?     Watery  4. VOMITING: Are you also vomiting? If Yes, ask: How many times in the past 24 hours?      No  5. ABDOMEN PAIN: Are you having any abdomen pain? If Yes, ask: What does it feel like? (e.g., crampy, dull, intermittent, constant)      No  6. ABDOMEN PAIN SEVERITY: If present, ask: How bad is the pain?  (e.g., Scale 1-10; mild, moderate, or severe)     Denies  7. ORAL INTAKE: If vomiting, Have you been able to drink liquids? How much liquids have you had in the past 24 hours?     Yes  8. HYDRATION: Any signs of dehydration? (e.g., dry mouth [not just dry lips], too weak to stand, dizziness, new weight loss) When did you last urinate?     Denies any signs of dehydration  9. EXPOSURE: Have you traveled to a foreign country recently? Have you been exposed to anyone with diarrhea? Could you have eaten any food that was spoiled?     NO  10. ANTIBIOTIC USE: Are you taking antibiotics now or have  you taken antibiotics in the past 2 months?       No  11. OTHER SYMPTOMS: Do you have any other symptoms? (e.g., fever, blood in stool)       Headaches  12. PREGNANCY: Is there any chance you are pregnant? When was your last menstrual period?       No and No  Protocols used: Diarrhea-A-AH

## 2024-03-17 NOTE — Telephone Encounter (Signed)
 Please make patient an appointment to see PCP as soon as he has availability. If she experiences signs of dehydration over the weekend she should go to the ER. Signs of dehydration can include fatigue, chest pain, shortness of breath, cardiac palpitations, dizziness, lightheadedness.

## 2024-03-22 ENCOUNTER — Ambulatory Visit (INDEPENDENT_AMBULATORY_CARE_PROVIDER_SITE_OTHER): Admitting: Emergency Medicine

## 2024-03-22 ENCOUNTER — Ambulatory Visit: Payer: Self-pay | Admitting: Emergency Medicine

## 2024-03-22 ENCOUNTER — Encounter: Payer: Self-pay | Admitting: Emergency Medicine

## 2024-03-22 VITALS — BP 124/84 | HR 54 | Temp 98.2°F | Ht 66.0 in | Wt 203.0 lb

## 2024-03-22 DIAGNOSIS — R197 Diarrhea, unspecified: Secondary | ICD-10-CM

## 2024-03-22 LAB — COMPREHENSIVE METABOLIC PANEL WITH GFR
ALT: 18 U/L (ref 0–35)
AST: 21 U/L (ref 0–37)
Albumin: 3.8 g/dL (ref 3.5–5.2)
Alkaline Phosphatase: 41 U/L (ref 39–117)
BUN: 16 mg/dL (ref 6–23)
CO2: 32 meq/L (ref 19–32)
Calcium: 9.3 mg/dL (ref 8.4–10.5)
Chloride: 104 meq/L (ref 96–112)
Creatinine, Ser: 0.85 mg/dL (ref 0.40–1.20)
GFR: 68.09 mL/min (ref >60.00)
Glucose, Bld: 117 mg/dL — ABNORMAL HIGH (ref 70–99)
Potassium: 3.8 meq/L (ref 3.5–5.1)
Sodium: 141 meq/L (ref 135–145)
Total Bilirubin: 0.3 mg/dL (ref 0.2–1.2)
Total Protein: 6.9 g/dL (ref 6.0–8.3)

## 2024-03-22 LAB — CBC WITH DIFFERENTIAL/PLATELET
Basophils Absolute: 0 K/uL (ref 0.0–0.1)
Basophils Relative: 0.5 % (ref 0.0–3.0)
Eosinophils Absolute: 0.1 K/uL (ref 0.0–0.7)
Eosinophils Relative: 1.2 % (ref 0.0–5.0)
HCT: 37.8 % (ref 36.0–46.0)
Hemoglobin: 12.6 g/dL (ref 12.0–15.0)
Lymphocytes Relative: 30.6 % (ref 12.0–46.0)
Lymphs Abs: 1.4 K/uL (ref 0.7–4.0)
MCHC: 33.5 g/dL (ref 30.0–36.0)
MCV: 87.7 fl (ref 78.0–100.0)
Monocytes Absolute: 0.4 K/uL (ref 0.1–1.0)
Monocytes Relative: 8.1 % (ref 3.0–12.0)
Neutro Abs: 2.6 K/uL (ref 1.4–7.7)
Neutrophils Relative %: 59.6 % (ref 43.0–77.0)
Platelets: 235 K/uL (ref 150.0–400.0)
RBC: 4.31 Mil/uL (ref 3.87–5.11)
RDW: 13.7 % (ref 11.5–15.5)
WBC: 4.4 K/uL (ref 4.0–10.5)

## 2024-03-22 NOTE — Patient Instructions (Signed)
 Diarrhea, Adult Diarrhea is when you pass loose and sometimes watery poop (stool) often. Diarrhea can make you feel weak and cause you to lose water in your body (get dehydrated). Losing water in your body can cause you to: Feel tired and thirsty. Have a dry mouth. Go pee (urinate) less often. Diarrhea often lasts 2-3 days. It can last longer if it is a sign of something more serious. Be sure to treat your diarrhea as told by your doctor. Follow these instructions at home: Eating and drinking     Follow these instructions as told by your doctor: Take an ORS (oral rehydration solution). This is a drink that helps you replace fluids and minerals your body lost. It is sold at pharmacies and stores. Drink enough fluid to keep your pee (urine) pale yellow. Drink fluids such as: Water. You can also get fluids by sucking on ice chips. Diluted fruit juice. Low-calorie sports drinks. Milk. Avoid drinking fluids that have a lot of sugar or caffeine in them. These include soda, energy drinks, and regular sports drinks. Avoid alcohol. Eat bland, easy-to-digest foods in small amounts as you are able. These foods include: Bananas. Applesauce. Rice. Low-fat (lean) meats. Toast. Crackers. Avoid spicy or fatty foods.  Medicines Take over-the-counter and prescription medicines only as told by your doctor. If you were prescribed antibiotics, take them as told by your doctor. Do not stop taking them even if you start to feel better. General instructions  Wash your hands often using soap and water for 20 seconds. If soap and water are not available, use hand sanitizer. Others in your home should wash their hands as well. Wash your hands: After using the toilet or changing a diaper. Before preparing, cooking, or serving food. While caring for a sick person. While visiting someone in a hospital. Rest at home while you get better. Take a warm bath to help with any burning or pain from having  diarrhea. Watch your condition for any changes. Contact a doctor if: You have a fever. Your diarrhea gets worse. You have new symptoms. You vomit every time you eat or drink. You feel light-headed, dizzy, or you have a headache. You have muscle cramps. You have signs of losing too much water in your body, such as: Dark pee, very little pee, or no pee. Cracked lips. Dry mouth. Sunken eyes. Sleepiness. Weakness. You have bloody or black poop or poop that looks like tar. You have very bad pain, cramping, or bloating in your belly (abdomen). Your skin feels cold and clammy. You feel confused. Get help right away if: You have chest pain. Your heart is beating very quickly. You have trouble breathing or you are breathing very quickly. You feel very weak or you faint. These symptoms may be an emergency. Get help right away. Call 911. Do not wait to see if the symptoms will go away. Do not drive yourself to the hospital. This information is not intended to replace advice given to you by your health care provider. Make sure you discuss any questions you have with your health care provider. Document Revised: 09/30/2021 Document Reviewed: 09/30/2021 Elsevier Patient Education  2024 [employer].

## 2024-03-22 NOTE — Progress Notes (Addendum)
 Lisa Miles 73 y.o.   Chief Complaint  Patient presents with  . Diarrhea    Pt states that this has stopped they went to the drug store and picked up an anti diarrhea medication. They are unsure as to what is causing it she was dealing with it for a week   . Headache    Pt states that she also been having morning headaches but throughout the day it goes away     HISTORY OF PRESENT ILLNESS: This is a 73 y.o. female developed watery nonbloody diarrhea with intermittent cramping last week.  Much improved today. Denies nausea or vomiting.  Able to eat and drink.  No longer having diarrhea or abdominal pain Has occasional morning headaches to get better during the day No other complaints or medical concerns today.  Diarrhea  Associated symptoms include headaches. Pertinent negatives include no abdominal pain, chills, coughing, fever or vomiting.  Headache  Pertinent negatives include no abdominal pain, coughing, fever, nausea, sore throat or vomiting.     Prior to Admission medications   Medication Sig Start Date End Date Taking? Authorizing Provider  acetaminophen  (TYLENOL ) 500 MG tablet Take 2 tablets (1,000 mg total) by mouth every 6 (six) hours as needed. 07/29/23  Yes Tammy Sor, PA-C  Apoaequorin (PREVAGEN EXTRA STRENGTH) 20 MG CAPS Take 1 capsule by mouth daily.   Yes [provider]  aspirin  EC 81 MG tablet Take 81 mg by mouth daily. Swallow whole.   Yes [provider]  Calcium  Carb-Cholecalciferol (CALCIUM  + VITAMIN D3 PO) Take 600-800 mg by mouth in the morning.   Yes [provider]  chlorthalidone  (HYGROTON ) 25 MG tablet TAKE 1/2 TABLET BY MOUTH DAILY 08/02/23  Yes Anner Alm ORN, MD  Cholecalciferol (VITAMIN D ) 50 MCG (2000 UT) CAPS Take 2,000 Units by mouth daily.   Yes [provider]  diclofenac  Sodium (VOLTAREN ) 1 % GEL Apply 2 g topically 4 (four) times daily. 09/22/23  Yes Long, Fonda MATSU, MD  esomeprazole  (NEXIUM ) 20 MG capsule  TAKE 1 CAPSULE(20 MG) BY MOUTH TWICE DAILY BEFORE A MEAL 12/21/23  Yes Abran Norleen SAILOR, MD  fluticasone  [facility]) 50 MCG/ACT nasal spray Place 1 spray into both nostrils daily.   Yes [provider]  hydroxypropyl methylcellulose / hypromellose (ISOPTO TEARS / GONIOVISC) 2.5 % ophthalmic solution Place 1 drop into both eyes in the morning and at bedtime.   Yes [provider]  ibuprofen  (ADVIL ) 200 MG tablet Take 600-800 mg by mouth as needed for mild pain (pain score 1-3) or headache.   Yes [provider]  [name]  (KEPPRA ) 750 MG tablet Take 1 tablet (750 mg total) by mouth 2 (two) times daily. 11/16/23  Yes Gayland Lauraine PARAS, NP  metoprolol  succinate (TOPROL -XL) 25 MG 24 hr tablet TAKE 1 TABLET(25 MG) BY MOUTH DAILY 10/05/23  Yes Anner Alm ORN, MD  rosuvastatin  (CRESTOR ) 10 MG tablet TAKE 1 TABLET(10 MG) BY MOUTH DAILY 03/12/24  Yes Andreya Lacks, Emil Schanz, MD  senna-docusate (SENOKOT-S) 8.6-50 MG tablet Take 1 tablet by mouth at bedtime as needed for mild constipation. 09/22/23  Yes Long, Fonda MATSU, MD  valsartan  (DIOVAN ) 320 MG tablet Take 1 tablet (320 mg total) by mouth daily. 12/28/23  Yes Sayra Frisby, Emil Schanz, MD  amoxicillin  (AMOXIL ) 500 MG tablet Take 500 mg by mouth. Take 4 tablets before dental procedures. Patient not taking: Reported on 03/22/2024    [provider]  lidocaine  (LIDODERM ) 5 % Place 2 patches onto the  skin daily. Remove & Discard patch within 12 hours or as directed by MD Patient not taking: Reported on 03/22/2024 07/29/23   Tammy Sor, PA-C  methocarbamol  (ROBAXIN ) 500 MG tablet Take 2 tablets (1,000 mg total) by mouth every 8 (eight) hours as needed for muscle spasms. Patient not taking: Reported on 03/22/2024 07/29/23   Tammy Sor, PA-C  oxyCODONE  (ROXICODONE ) 5 MG immediate release tablet Take 1 tablet (5 mg total) by mouth every 6 (six) hours as needed for severe pain (pain score 7-10). Patient not taking: Reported on 03/22/2024  09/22/23   Long, Fonda MATSU, MD    No Known Allergies  Patient Active Problem List   Diagnosis Date Noted  . Thyroid  nodule 03/22/2023  . Seizure disorder (HCC) 01/06/2023  . Memory problem 07/07/2022  . Chronic nonintractable headache 07/07/2022  . PAC (premature atrial contraction) -with atrial Runs 07/19/2021  . Mild aortic stenosis 06/06/2021  . Valvular heart disease 02/20/2021  . OSA (obstructive sleep apnea) 11/02/2019  . Non-restorative sleep 08/24/2019  . Primary osteoarthritis involving multiple joints 04/01/2018  . OAB (overactive bladder) 04/01/2018  . Thyromegaly 03/28/2013  . Essential hypertension 07/14/2012  . Dyslipidemia 05/30/2009  . Moderate to severe mitral regurgitation 05/30/2009    Past Medical History:  Diagnosis Date  . Aortic stenosis, mild-moderate 07/2012   TTE October 21: Moderate aortic valve thickening with mild to moderate AI & mild AS; TEE: mild to moderate AI & MIld AS  . Arthritis    Phreesia 11/06/2019  . Asthma   . Bilateral bunions    Bunionectomies performed  . Bronchitis, chronic (HCC)   . Cataract    Phreesia 11/06/2019  . Heart disease   . Hyperlipidemia   . Hypertension    Good control  . Inguinodynia    Bilateral groin pain  . Lower extremity edema    Chronic. Venous Duplex 11/06/11 SUMMARY: 1) Bilateral Lower Extremities: No evidence of DVT or thrombophlebitis.  2) Right Common Femoral Vein: Demonstrated mild valvular insufficiency with a greater than (1) sec of duration. Mildly abnormal LE Venous duplex Doppler.  . Obesity   . Palpitations    Relatively well controlled  . Scoliosis    DG Chest 2 View x-ray on 07/30/11 by Dr. Humberto shows a scoliosis.  . Severe mitral regurgitation by prior echocardiogram 07/2012   Mild AMVL prolapse, Mod MR -- no MVP noted in 01/2018 -> 11/'21: Severe MR due to restricted movement of P3 scallop of PMVL (IIIB) due to incomplete leaflet coaptation -> thickened leaflets consistent with Rheumatic  Heart Valve Disease.    Past Surgical History:  Procedure Laterality Date  . ABDOMINAL HYSTERECTOMY    . ANKLE ARTHROSCOPY Left   . BUNIONECTOMY Bilateral   . CARDIAC EVENT MONITOR  03/2020   (04/05/2020 -05/04/2020): Mostly NSR.  Heart rate range 42-148 bpm.  3 short bursts of 4-5 beats NSVT (asymptomatic).  116 beat run of PAT.  Rare PACs and PVCs.  Some bigeminy and trigeminy.  SABRA CARDIOPULMONARY EXERCISE TEST (CPX)  05/09/2020   (done with Ex St Echo): Normal Functional Capacity. No indication for CP limitations.  Body Habitus contributes to Exercise Intolerance.  SABRA EXERCISE STRESS ECHO  05/09/2020   TTE shows normal EF 65% with LVH No pericardial effusion Normal RV size and function Moderate appearing rheumatic MR/AR.;; -- NO COMMENT ON EXERCISE EFFECT ON MR!!! (even though this was the reason for ordering the test)  . EYE SURGERY N/A    Phreesia 11/06/2019  .  IR RADIOLOGIST EVAL & MGMT  06/14/2023  . IR RADIOLOGIST EVAL & MGMT  10/04/2023  . KNEE ARTHROSCOPY Right   . Left knee open surgery     pt thinks she only had shots in L knee, not surgery  . RIGHT/LEFT HEART CATH AND CORONARY ANGIOGRAPHY N/A 03/28/2020   RIGHT/LEFT HEART CATH AND CORONARY ANGIOGRAPHY;  Anner Alm ORN, MD;  Location: [facility] INVASIVE CV LAB; angiographically normal coronary arteries.  Cloacal LM. Normal RHC Pressures (PAP 40/13 - mean 22 mmHg, PCWP , V wave 25 mmHg c/w MR, LVEDP 15 mmHg); CO/CI 5.02 / 2.45 (reduced).  . TEE WITHOUT CARDIOVERSION N/A 03/13/2020   Procedure: TRANSESOPHAGEAL ECHOCARDIOGRAM (TEE);  Surgeon: Barbaraann Darryle Ned, MD;  [facility] ENDOSCOPY;;; Severe MR 2/2 restricted P3 scallop of PMVL (IIIB) w/ incomplete leaflet coaptation.  Thickened /degenerative leaflets c/w Rheumatic Valvular Heart Disease.  2D PISA radius 0.9 cm with 2D ERO 0.26 cm2 with R Vol 62 cc. PV blunting w/ BP 109/60 mmHg. No MS. Mild-mod AI / Mild AS. Gr 2 plaque Aorta  . TOTAL ABDOMINAL HYSTERECTOMY    . TRANSTHORACIC  ECHOCARDIOGRAM  05/09/2021   EF 60 to 65%.  No RWMA.  GR 1 DD with elevated LAP-moderately dilated left atrium (LA).  Normal RV size and function.  Moderate to severe MR with no MS.  Moderate aortic calcification/sclerosis with only mild stenosis.  Ascending aorta 39 mm.  . TRANSTHORACIC ECHOCARDIOGRAM  01/2018   a) Normal LV size.  Mod Conc LVH.  EF 60-65%.  No R WMA.  GR 1 DD (high P).  Mild AS (MG 16 mmHg) w/ Mod AI.  Severe LA dilation.  Mild MR.; 01/2020:  EF 65-70%. No RWMA.  Severe MR w/ restricted PMVL movement.  (Recommend TEE).  Mod AoV thickening w/ mild to mod AS & Mod AI.  Nl RV fxn.  Mildly elevated filling P. Severe LA dilation.  . Zio Patch Monitor  05/2021   Predominant Rhythm Is Sinus Rhythm - HR range 45 -139 bpm & avg 74 bpm.  Occ PACs (2.9%) w/ some couplets and triplets,& rare PVCs. 20 Atrial Runs: Fastest - 6 beats w/ rate 235 bpm; Longest 12 beats @ 138 bpm (~ 5.2 sec); Symptoms were noted with PACs and PVCs not with atrial tachycardia runs.    Social History   Socioeconomic History  . Marital status: Married    Spouse name: Lamar  . Number of children: 2  . Years of education: Not on file  . Highest education level: Associate degree: occupational, scientist, product/process development, or vocational program  Occupational History  . Occupation: production    Employer: [employer]    Comment: check printing/shipping  Tobacco Use  . Smoking status: Never    Passive exposure: Never  . Smokeless tobacco: Never  Vaping Use  . Vaping status: Never Used  Substance and Sexual Activity  . Alcohol  use: No  . Drug use: No  . Sexual activity: Yes  Other Topics Concern  . Not on file  Social History Narrative   Lives with her husband.  Their children live nearby.   Right handed   Caffeine: 1 cup/day   Social Drivers of Corporate Investment Banker Strain: Low Risk  (01/21/2024)   Overall Financial Resource Strain (CARDIA)   . Difficulty of Paying Living Expenses: Not very hard  Food  Insecurity: No Food Insecurity (01/21/2024)   Hunger Vital Sign   . Worried About Programme Researcher, Broadcasting/film/video in the Last Year: Never  true   . Ran Out of Food in the Last Year: Never true  Transportation Needs: Unmet Transportation Needs (01/21/2024)   PRAPARE - Transportation   . Lack of Transportation (Medical): Yes   . Lack of Transportation (Non-Medical): No  Physical Activity: Inactive (09/06/2023)   Exercise Vital Sign   . Days of Exercise per Week: 0 days   . Minutes of Exercise per Session: 0 min  Stress: No Stress Concern Present (09/06/2023)   [employer] of Occupational Health - Occupational Stress Questionnaire   . Feeling of Stress : Not at all  Social Connections: Moderately Integrated (01/21/2024)   Social Connection and Isolation Panel   . Frequency of Communication with Friends and Family: More than three times a week   . Frequency of Social Gatherings with Friends and Family: More than three times a week   . Attends Religious Services: More than 4 times per year   . Active Member of Clubs or Organizations: No   . Attends Banker Meetings: Not on file   . Marital Status: Married  Catering Manager Violence: Not At Risk (09/06/2023)   Humiliation, Afraid, Rape, and Kick questionnaire   . Fear of Current or Ex-Partner: No   . Emotionally Abused: No   . Physically Abused: No   . Sexually Abused: No    Family History  Problem Relation Age of Onset  . Arthritis Mother   . Hyperlipidemia Mother   . Diabetes Mother   . Gout Mother   . Hypertension Mother   . Dementia Mother   . Cancer Father        GI cancer  . Hypertension Brother   . Diabetes Brother   . Hypertension Brother   . Diabetes Maternal Grandfather   . Migraines Neg Hx   . Headache Neg Hx      Review of Systems  Constitutional: Negative.  Negative for chills and fever.  HENT: Negative.  Negative for congestion and sore throat.   Respiratory: Negative.  Negative for cough and shortness of  breath.   Cardiovascular: Negative.  Negative for chest pain and palpitations.  Gastrointestinal:  Positive for diarrhea. Negative for abdominal pain, blood in stool, melena, nausea and vomiting.  Genitourinary: Negative.  Negative for dysuria and hematuria.  Skin: Negative.  Negative for rash.  Neurological:  Positive for headaches.  All other systems reviewed and are negative.   Today's Vitals   03/22/24 1013  BP: 124/84  Pulse: (!) 54  Temp: 98.2 F (36.8 C)  TempSrc: Oral  SpO2: 95%  Weight: 203 lb (92.1 kg)  Height: 5' 6 (1.676 m)   Body mass index is 32.77 kg/m.   Physical Exam Vitals reviewed.  Constitutional:      Appearance: Normal appearance.  HENT:     Head: Normocephalic.     Mouth/Throat:     Mouth: Mucous membranes are moist.     Pharynx: Oropharynx is clear.  Eyes:     Extraocular Movements: Extraocular movements intact.     Conjunctiva/sclera: Conjunctivae normal.     Pupils: Pupils are equal, round, and reactive to light.  Cardiovascular:     Rate and Rhythm: Normal rate and regular rhythm.     Pulses: Normal pulses.     Heart sounds: Murmur heard.  Pulmonary:     Effort: Pulmonary effort is normal.     Breath sounds: Normal breath sounds.  Abdominal:     Palpations: Abdomen is soft.     Tenderness:  There is no abdominal tenderness.  Musculoskeletal:     Cervical back: No tenderness.  Lymphadenopathy:     Cervical: No cervical adenopathy.  Skin:    General: Skin is warm and dry.     Capillary Refill: Capillary refill takes less than 2 seconds.  Neurological:     General: No focal deficit present.     Mental Status: She is alert and oriented to Haberl, place, and time.  Psychiatric:        Mood and Affect: Mood normal.        Behavior: Behavior normal.      ASSESSMENT & PLAN: Problem List Items Addressed This Visit       Digestive   Diarrhea of presumed infectious origin - Primary   Clinically stable.  Running its course without  complications. Benign abdominal examination.  Afebrile. Stable vital signs. Differential diagnosis discussed with patient. Symptom management discussed Advised to continue resting and stay well-hydrated Recommend blood work and stool cultures today ED precautions given. Advised to contact the office if no better or worse during the next several days.      Relevant Orders   GI Profile, Stool, PCR   CBC with Differential/Platelet   Comprehensive metabolic panel with GFR   Patient Instructions  Diarrhea, Adult Diarrhea is when you pass loose and sometimes watery poop (stool) often. Diarrhea can make you feel weak and cause you to lose water in your body (get dehydrated). Losing water in your body can cause you to: Feel tired and thirsty. Have a dry mouth. Go pee (urinate) less often. Diarrhea often lasts 2-3 days. It can last longer if it is a sign of something more serious. Be sure to treat your diarrhea as told by your doctor. Follow these instructions at home: Eating and drinking     Follow these instructions as told by your doctor: Take an ORS (oral rehydration solution). This is a drink that helps you replace fluids and minerals your body lost. It is sold at pharmacies and stores. Drink enough fluid to keep your pee (urine) pale yellow. Drink fluids such as: Water. You can also get fluids by sucking on ice chips. Diluted fruit juice. Low-calorie sports drinks. Milk. Avoid drinking fluids that have a lot of sugar or caffeine in them. These include soda, energy drinks, and regular sports drinks. Avoid alcohol . Eat bland, easy-to-digest foods in small amounts as you are able. These foods include: Bananas. Applesauce. Rice. Low-fat (lean) meats. Toast. Crackers. Avoid spicy or fatty foods.  Medicines Take over-the-counter and prescription medicines only as told by your doctor. If you were prescribed antibiotics, take them as told by your doctor. Do not stop taking them  even if you start to feel better. General instructions  Wash your hands often using soap and water for 20 seconds. If soap and water are not available, use hand sanitizer. Others in your home should wash their hands as well. Wash your hands: After using the toilet or changing a diaper. Before preparing, cooking, or serving food. While caring for a sick [place]. While visiting someone in a hospital. Rest at home while you get better. Take a warm bath to help with any burning or pain from having diarrhea. Watch your condition for any changes. Contact a doctor if: You have a fever. Your diarrhea gets worse. You have new symptoms. You vomit every time you eat or drink. You feel light-headed, dizzy, or you have a headache. You have muscle cramps. You have signs of  losing too much water in your body, such as: Dark pee, very little pee, or no pee. Cracked lips. Dry mouth. Sunken eyes. Sleepiness. Weakness. You have bloody or black poop or poop that looks like tar. You have very bad pain, cramping, or bloating in your belly (abdomen). Your skin feels cold and clammy. You feel confused. Get help right away if: You have chest pain. Your heart is beating very quickly. You have trouble breathing or you are breathing very quickly. You feel very weak or you faint. These symptoms may be an emergency. Get help right away. Call 911. Do not wait to see if the symptoms will go away. Do not drive yourself to the hospital. This information is not intended to replace advice given to you by your health care provider. Make sure you discuss any questions you have with your health care provider. Document Revised: 09/30/2021 Document Reviewed: 09/30/2021 Elsevier Patient Education  2024 Elsevier Inc.    Emil Schaumann, MD [facility]

## 2024-03-22 NOTE — Assessment & Plan Note (Signed)
 Clinically stable.  Running its course without complications. Benign abdominal examination.  Afebrile. Stable vital signs. Differential diagnosis discussed with patient. Symptom management discussed Advised to continue resting and stay well-hydrated Recommend blood work and stool cultures today ED precautions given. Advised to contact the office if no better or worse during the next several days.

## 2024-04-03 ENCOUNTER — Telehealth: Payer: Self-pay

## 2024-04-03 NOTE — Telephone Encounter (Signed)
 Copied from CRM #[id number]. Topic: Clinical - Medical Advice >> Apr 03, 2024  4:15 PM Ashley R wrote: Reason for CRM: pt would like to know if Ultrasound/biopsy can be scheduled before the results from labs have returned. Would like to know when order is placed, to know when to call in for scheduling. [phone]

## 2024-04-04 LAB — GI PROFILE, STOOL, PCR

## 2024-04-07 LAB — HM MAMMOGRAPHY

## 2024-04-10 ENCOUNTER — Encounter: Payer: Self-pay | Admitting: Emergency Medicine

## 2024-04-12 ENCOUNTER — Ambulatory Visit: Payer: Self-pay

## 2024-04-12 NOTE — Telephone Encounter (Signed)
 FYI Only or Action Required?: FYI only for provider: fyi.  Patient was last seen in primary care on 03/22/2024 by Pur[place] Emil Schanz, MD.  Called Nurse Triage reporting Diarrhea.  Symptoms began couple of months.  Interventions attempted: Nothing.  Symptoms are: gradually worsening.  Triage Disposition: Home Care  Patient/caregiver understands and will follow disposition?: Yes   Copied from CRM #[id number]. Topic: Clinical - Red Word Triage >> Apr 12, 2024 10:30 AM Shereese L wrote: [facility] that prompted transfer to Nurse Triage: Uncontrolled diarrhea; about 3 or 4 times a day in the morning Reason for Disposition  MILD-MODERATE diarrhea (e.g., 1-6 times / day more than normal)  Answer Assessment - Initial Assessment Questions 1. DIARRHEA SEVERITY: How bad is the diarrhea? How many more stools have you had in the past 24 hours than normal?      Watery, loose  2. ONSET: When did the diarrhea begin?      month 3. STOOL DESCRIPTION:  How loose or watery is the diarrhea? What is the stool color? Is there any blood or mucous in the stool?     no 4. VOMITING: Are you also vomiting? If Yes, ask: How many times in the past 24 hours?      no 5. ABDOMEN PAIN: Are you having any abdomen pain? If Yes, ask: What does it feel like? (e.g., crampy, dull, intermittent, constant)      no 6. ABDOMEN PAIN SEVERITY: If present, ask: How bad is the pain?  (e.g., Scale 1-10; mild, moderate, or severe)     no 7. ORAL INTAKE: If vomiting, Have you been able to drink liquids? How much liquids have you had in the past 24 hours?     Not drinking a lot of water 8. HYDRATION: Any signs of dehydration? (e.g., dry mouth [not just dry lips], too weak to stand, dizziness, new weight loss) When did you last urinate?     unsure 9. EXPOSURE: Have you traveled to a foreign country recently? Have you been exposed to anyone with diarrhea? Could you have eaten any food that was  spoiled?     no 10. ANTIBIOTIC USE: Are you taking antibiotics now or have you taken antibiotics in the past 2 months?       no 11. OTHER SYMPTOMS: Do you have any other symptoms? (e.g., fever, blood in stool)       no 12. PREGNANCY: Is there any chance you are pregnant? When was your last menstrual period?       N/a  Protocols used: [facility]

## 2024-04-12 NOTE — Telephone Encounter (Signed)
 Patient has been scheduled to be seen with Ms Corean cough for evaluation

## 2024-04-12 NOTE — Telephone Encounter (Signed)
 Please advise

## 2024-04-12 NOTE — Telephone Encounter (Signed)
 Should be seen and reevaluated.  Any provider can see her.  Thanks.

## 2024-04-17 ENCOUNTER — Ambulatory Visit: Admitting: Family Medicine

## 2024-04-17 NOTE — Telephone Encounter (Signed)
-----   Message from Alm Clay, MD sent at 03/25/2024 12:25 AM EST ----- Chemistry panel looks stable.  Potassium is slightly reduced.  Blood sugar still somewhat elevated.  Liver function kidney function stable. Pretty much stable from 2 months ago and 8 months ago.   Alm Clay, MD  ----- Message ----- From: Interface, Lab In Three Zero One Sent: 03/22/2024  12:32 PM EST To: Alm LELON Clay, MD

## 2024-04-17 NOTE — Telephone Encounter (Signed)
 The patient has been notified of the result and verbalized understanding.  All questions (if any) were answered. Gladis Reena GAILS, RN 04/17/2024 11:28 AM

## 2024-04-18 ENCOUNTER — Encounter: Payer: Self-pay | Admitting: Emergency Medicine

## 2024-04-21 ENCOUNTER — Other Ambulatory Visit: Payer: Self-pay | Admitting: Internal Medicine

## 2024-05-19 ENCOUNTER — Telehealth: Payer: Self-pay

## 2024-05-19 ENCOUNTER — Other Ambulatory Visit: Payer: Self-pay

## 2024-05-19 MED ORDER — ESOMEPRAZOLE MAGNESIUM 20 MG PO CPDR: 20.0000 mg | DELAYED_RELEASE_CAPSULE | Freq: Two times a day (BID) | ORAL | 3 refills | Status: AC

## 2024-05-19 NOTE — Telephone Encounter (Signed)
 OK to refill

## 2024-05-19 NOTE — Telephone Encounter (Signed)
 Copied from CRM [id number]. Topic: Clinical - Medication Refill >> May 19, 2024 12:09 PM Chasity T wrote: Medication: esomeprazole  (NEXIUM ) 20 MG capsule   Has the patient contacted their pharmacy? Yes   This is the patient's preferred pharmacy:   [id number] [address] [place], [place] - [address] AT [facility] & [address] [place] [address] Phone: [phone] Fax: [phone]  Is this the correct pharmacy for this prescription? Yes If no, delete pharmacy and type the correct one.   Has the prescription been filled recently? No  Is the patient out of the medication? Yes  Has the patient been seen for an appointment in the last year OR does the patient have an upcoming appointment? Yes  Can we respond through MyChart? Yes  Agent: Please be advised that Rx refills may take up to 3 business days. We ask that you follow-up with your pharmacy.

## 2024-05-19 NOTE — Telephone Encounter (Signed)
 Sent in

## 2024-05-23 ENCOUNTER — Ambulatory Visit: Admitting: Neurology

## 2024-05-30 ENCOUNTER — Ambulatory Visit (HOSPITAL_COMMUNITY)

## 2024-05-31 ENCOUNTER — Ambulatory Visit: Admitting: Neurology

## 2024-05-31 ENCOUNTER — Encounter: Payer: Self-pay | Admitting: Neurology

## 2024-05-31 VITALS — BP 125/70 | HR 57 | Ht 65.0 in | Wt 207.0 lb

## 2024-05-31 DIAGNOSIS — G40209 Localization-related (focal) (partial) symptomatic epilepsy and epileptic syndromes with complex partial seizures, not intractable, without status epilepticus: Secondary | ICD-10-CM | POA: Diagnosis not present

## 2024-05-31 MED ORDER — LEVETIRACETAM 1000 MG PO TABS: 1000.0000 mg | ORAL_TABLET | Freq: Two times a day (BID) | ORAL | 3 refills | Status: AC

## 2024-05-31 NOTE — Patient Instructions (Addendum)
 Please increase Keppra  to 1000 mg twice daily, for now continue with Keppra  750 mg 1 tablet in the morning and 2 tablet in the evening until you get your new prescription Continue the medication Keep seizure diary Please call us  for any additional concerns Follow-up in 6 months or sooner if worse
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
## 2024-06-23 ENCOUNTER — Ambulatory Visit (HOSPITAL_COMMUNITY)

## 2024-09-06 ENCOUNTER — Ambulatory Visit

## 2024-12-19 ENCOUNTER — Ambulatory Visit: Admitting: Neurology
